# Patient Record
Sex: Male | Born: 1945 | Race: Black or African American | Hispanic: No | Marital: Married | Smoking: Current every day smoker
Health system: Southern US, Community
[De-identification: ages and names within clinical notes are randomized; demographics above are authoritative.]

## PROBLEM LIST (undated history)

## (undated) DIAGNOSIS — E119 Type 2 diabetes mellitus without complications: Secondary | ICD-10-CM

## (undated) DIAGNOSIS — I10 Essential (primary) hypertension: Secondary | ICD-10-CM

## (undated) HISTORY — PX: KNEE SURGERY: SHX244

## (undated) HISTORY — DX: Essential (primary) hypertension: I10

## (undated) HISTORY — DX: Type 2 diabetes mellitus without complications: E11.9

## (undated) HISTORY — PX: HERNIA REPAIR: SHX51

---

## 2006-04-29 ENCOUNTER — Emergency Department (HOSPITAL_COMMUNITY): Admission: EM | Admit: 2006-04-29 | Discharge: 2006-04-29 | Payer: Self-pay | Admitting: Emergency Medicine

## 2019-11-29 ENCOUNTER — Other Ambulatory Visit: Payer: Self-pay

## 2019-11-29 ENCOUNTER — Ambulatory Visit: Payer: Non-veteran care | Attending: Internal Medicine

## 2019-11-29 DIAGNOSIS — Z20822 Contact with and (suspected) exposure to covid-19: Secondary | ICD-10-CM | POA: Insufficient documentation

## 2019-11-30 LAB — NOVEL CORONAVIRUS, NAA: SARS-CoV-2, NAA: NOT DETECTED

## 2020-02-28 ENCOUNTER — Other Ambulatory Visit: Payer: Self-pay | Admitting: Family

## 2020-02-28 ENCOUNTER — Other Ambulatory Visit (HOSPITAL_COMMUNITY): Payer: Self-pay | Admitting: Family

## 2020-02-28 DIAGNOSIS — F17200 Nicotine dependence, unspecified, uncomplicated: Secondary | ICD-10-CM

## 2020-03-18 ENCOUNTER — Ambulatory Visit (HOSPITAL_COMMUNITY)
Admission: RE | Admit: 2020-03-18 | Discharge: 2020-03-18 | Disposition: A | Discharge status: Home / Self Care | Payer: No Typology Code available for payment source | Source: Ambulatory Visit | Attending: Family | Admitting: Family

## 2020-03-18 ENCOUNTER — Other Ambulatory Visit: Payer: Self-pay

## 2020-03-18 DIAGNOSIS — F17200 Nicotine dependence, unspecified, uncomplicated: Secondary | ICD-10-CM | POA: Insufficient documentation

## 2021-03-30 ENCOUNTER — Other Ambulatory Visit (HOSPITAL_COMMUNITY): Payer: Self-pay | Admitting: Physician Assistant

## 2021-03-30 DIAGNOSIS — M25559 Pain in unspecified hip: Secondary | ICD-10-CM

## 2021-04-13 ENCOUNTER — Ambulatory Visit (HOSPITAL_COMMUNITY)
Admission: RE | Admit: 2021-04-13 | Discharge: 2021-04-13 | Disposition: A | Discharge status: Home / Self Care | Payer: No Typology Code available for payment source | Source: Ambulatory Visit | Attending: Physician Assistant | Admitting: Physician Assistant

## 2021-04-13 DIAGNOSIS — M25559 Pain in unspecified hip: Secondary | ICD-10-CM | POA: Diagnosis not present

## 2021-05-06 ENCOUNTER — Other Ambulatory Visit: Payer: Self-pay | Admitting: *Deleted

## 2021-05-06 DIAGNOSIS — M25569 Pain in unspecified knee: Secondary | ICD-10-CM

## 2021-05-08 ENCOUNTER — Other Ambulatory Visit: Payer: Self-pay

## 2021-05-08 ENCOUNTER — Other Ambulatory Visit (HOSPITAL_COMMUNITY)
Admission: RE | Admit: 2021-05-08 | Discharge: 2021-05-08 | Disposition: A | Discharge status: Home / Self Care | Payer: No Typology Code available for payment source | Source: Ambulatory Visit | Attending: Surgery | Admitting: Surgery

## 2021-05-08 ENCOUNTER — Encounter: Payer: Self-pay | Admitting: Vascular Surgery

## 2021-05-08 ENCOUNTER — Ambulatory Visit (HOSPITAL_COMMUNITY)
Admission: RE | Admit: 2021-05-08 | Discharge: 2021-05-08 | Disposition: A | Discharge status: Home / Self Care | Payer: No Typology Code available for payment source | Source: Ambulatory Visit | Attending: Vascular Surgery | Admitting: Vascular Surgery

## 2021-05-08 ENCOUNTER — Ambulatory Visit (INDEPENDENT_AMBULATORY_CARE_PROVIDER_SITE_OTHER): Payer: No Typology Code available for payment source | Admitting: Vascular Surgery

## 2021-05-08 VITALS — BP 130/78 | HR 76 | Temp 97.9°F | Resp 20 | Ht 71.0 in | Wt 135.0 lb

## 2021-05-08 DIAGNOSIS — Z20822 Contact with and (suspected) exposure to covid-19: Secondary | ICD-10-CM | POA: Insufficient documentation

## 2021-05-08 DIAGNOSIS — M25569 Pain in unspecified knee: Secondary | ICD-10-CM | POA: Insufficient documentation

## 2021-05-08 DIAGNOSIS — I739 Peripheral vascular disease, unspecified: Secondary | ICD-10-CM

## 2021-05-08 DIAGNOSIS — Z01812 Encounter for preprocedural laboratory examination: Secondary | ICD-10-CM | POA: Insufficient documentation

## 2021-05-08 LAB — SARS CORONAVIRUS 2 (TAT 6-24 HRS): SARS Coronavirus 2: NEGATIVE

## 2021-05-08 MED ORDER — OXYCODONE-ACETAMINOPHEN 5-325 MG PO TABS: 1.0000 | ORAL_TABLET | ORAL | 0 refills | Status: DC | PRN

## 2021-05-08 NOTE — Progress Notes (Signed)
Patient ID: Todd Duran, male   DOB: May 21, 1946, 75 y.o.   MRN: [id number]  Reason for Consult: New Patient (Initial Visit)   Referred by Jesusita Oka,*  Subjective:     HPI:  Todd Duran is a 75 y.o. male with history of chronic everyday smoking, diabetes and hypertension.  He states that for 2 months he has had progressive wound on his left foot.  This is causing him significant pain.  He denies any fevers or chills.  He has been evaluated the [facility] hospital and sent here.  He denies any previous history of vascular surgery or intervention.  Patient does take aspirin daily.  He denies history of stroke, TIA or amaurosis.  No history of aneurysm personal or family.  Past Medical History:  Diagnosis Date   Diabetes mellitus without complication (HCC)    Hypertension    History reviewed. No pertinent family history. Past Surgical History:  Procedure Laterality Date   HERNIA REPAIR     KNEE SURGERY      Short Social History:  Social History   Tobacco Use   Smoking status: Every Day    Packs/day: 0.50    Pack years: 0.00    Types: Cigarettes   Smokeless tobacco: Never  Substance Use Topics   Alcohol use: Not on file    Allergies  Allergen Reactions   Lisinopril     Other reaction(s): Angioedema, ANGIOEDEMA OF LIPS    Current Outpatient Medications  Medication Sig Dispense Refill   amLODipine (NORVASC) 10 MG tablet Take 1 tablet by mouth daily.     atenolol (TENORMIN) 100 MG tablet TAKE ONE TABLET BY MOUTH EVERY DAY EVERY DAY     atorvastatin (LIPITOR) 20 MG tablet Take 1 tablet by mouth at bedtime.     cyanocobalamin 1000 MCG tablet Take 1 tablet by mouth daily.     hydrochlorothiazide (HYDRODIURIL) 25 MG tablet Take 1 tablet by mouth daily.     metFORMIN (GLUCOPHAGE) 500 MG tablet TAKE ONE TABLET BY MOUTH EVERY DAY WITH FOOD FOR DIABETES.     nicotine polacrilex (NICORETTE) 4 MG gum CHEW 1 PIECE BY MOUTH AS DIRECTED CHEW\T\PARK 1 PIECE OF GUM FOR UP  TO 30 MINUTES EVERY1-2 HOURS AS NEEDED FOR SMOKING CESSATION. DO NOT EXCEED 24 PIECES PER DAY. TAPER USE OVER 8 WEEKS. AVOID FOOD\T\     oxybutynin (DITROPAN) 5 MG tablet TAKE ONE TABLET BY MOUTH TWICE A DAY FOR URINARY URGENCY     Propylene Glycol (SYSTANE BALANCE) 0.6 % SOLN INSTILL 1 DROP IN EACH EYE AS DIRECTED TWO TO FOUR TIMES A DAY CONTACT PHARMACY WHEN NEW SUPPLY IS NEEDED     HYDROcodone-acetaminophen (NORCO) 7.5-325 MG tablet Take by mouth. (Patient not taking: Reported on 05/08/2021)     No current facility-administered medications for this visit.    Review of Systems  Constitutional:  Constitutional negative. HENT: HENT negative.  Eyes: Eyes negative.  Respiratory: Respiratory negative.  GI: Gastrointestinal negative.  Musculoskeletal: Positive for leg pain.  Skin: Positive for wound.  Neurological: Neurological negative. Hematologic: Hematologic/lymphatic negative.  Psychiatric: Psychiatric negative.       Objective:  Objective   Vitals:   05/08/21 1226  BP: 130/78  Pulse: 76  Resp: 20  Temp: 97.9 F (36.6 C)  SpO2: 99%  Weight: 135 lb (61.2 kg)  Height: 5\' 11"  (1.803 m)   Body mass index is 18.83 kg/m.  Physical Exam HENT:     Head: Normocephalic.  Nose:     Comments: Wearing a mask Eyes:     Pupils: Pupils are equal, round, and reactive to light.  Neck:     Vascular: No carotid bruit.  Cardiovascular:     Pulses:          Femoral pulses are 0 on the right side and 0 on the left side. Pulmonary:     Effort: Pulmonary effort is normal.  Abdominal:     General: Abdomen is flat.     Palpations: Abdomen is soft.  Neurological:     General: No focal deficit present.     Mental Status: He is alert.  Psychiatric:        Mood and Affect: Mood normal.        Behavior: Behavior normal.        Thought Content: Thought content normal.        Judgment: Judgment normal.      Data: ABI Findings:   +---------+------------------+-----+--------+--------+  Right    Rt Pressure (mmHg)IndexWaveformComment   +---------+------------------+-----+--------+--------+  Brachial 140                                      +---------+------------------+-----+--------+--------+  PTA                             absent            +---------+------------------+-----+--------+--------+  DP                              absent            +---------+------------------+-----+--------+--------+  Great Toe                       Absent            +---------+------------------+-----+--------+--------+   +---------+------------------+-----+--------+-----------------------------+   Left     Lt Pressure (mmHg)IndexWaveformComment                          +---------+------------------+-----+--------+-----------------------------+   Brachial 144                                                            +---------+------------------+-----+--------+-----------------------------+   PTA                             absent                                  +---------+------------------+-----+--------+-----------------------------+   DP                              absent                                  +---------+------------------+-----+--------+-----------------------------+   Great Toe  unable to obtain due to  ulcer  +---------+------------------+-----+--------+-----------------------------+        Assessment/Plan:     75 year old male presents with what appears to be a nonviable most of his left foot.  I cannot reliably feel femoral pulses bilaterally.  I discussed with him that ideally we would get a CT scan but given that this needs to be done urgently we will plan for angiography possibly need to be done from a brachial approach.  This will mostly be done to determine level of amputation of his left lower  extremity and to evaluate the right lower extremity given ABIs of 0 although he does not currently have any wounds there.  Patient demonstrates good understanding.  I have sent Percocet to his pharmacy today to get him through the weekend given that I am sure this is very painful and he appears very uncomfortable on exam today.     Maeola Harman MD [facility]

## 2021-05-08 NOTE — H&P (View-Only) (Signed)
Patient ID: Todd Duran, male   DOB: May 21, 1946, 75 y.o.   MRN: [id number]  Reason for Consult: New Patient (Initial Visit)   Referred by Jesusita Oka,*  Subjective:     HPI:  Todd Duran is a 75 y.o. male with history of chronic everyday smoking, diabetes and hypertension.  He states that for 2 months he has had progressive wound on his left foot.  This is causing him significant pain.  He denies any fevers or chills.  He has been evaluated the [facility] hospital and sent here.  He denies any previous history of vascular surgery or intervention.  Patient does take aspirin daily.  He denies history of stroke, TIA or amaurosis.  No history of aneurysm personal or family.  Past Medical History:  Diagnosis Date   Diabetes mellitus without complication (HCC)    Hypertension    History reviewed. No pertinent family history. Past Surgical History:  Procedure Laterality Date   HERNIA REPAIR     KNEE SURGERY      Short Social History:  Social History   Tobacco Use   Smoking status: Every Day    Packs/day: 0.50    Pack years: 0.00    Types: Cigarettes   Smokeless tobacco: Never  Substance Use Topics   Alcohol use: Not on file    Allergies  Allergen Reactions   Lisinopril     Other reaction(s): Angioedema, ANGIOEDEMA OF LIPS    Current Outpatient Medications  Medication Sig Dispense Refill   amLODipine (NORVASC) 10 MG tablet Take 1 tablet by mouth daily.     atenolol (TENORMIN) 100 MG tablet TAKE ONE TABLET BY MOUTH EVERY DAY EVERY DAY     atorvastatin (LIPITOR) 20 MG tablet Take 1 tablet by mouth at bedtime.     cyanocobalamin 1000 MCG tablet Take 1 tablet by mouth daily.     hydrochlorothiazide (HYDRODIURIL) 25 MG tablet Take 1 tablet by mouth daily.     metFORMIN (GLUCOPHAGE) 500 MG tablet TAKE ONE TABLET BY MOUTH EVERY DAY WITH FOOD FOR DIABETES.     nicotine polacrilex (NICORETTE) 4 MG gum CHEW 1 PIECE BY MOUTH AS DIRECTED CHEW\T\PARK 1 PIECE OF GUM FOR UP  TO 30 MINUTES EVERY1-2 HOURS AS NEEDED FOR SMOKING CESSATION. DO NOT EXCEED 24 PIECES PER DAY. TAPER USE OVER 8 WEEKS. AVOID FOOD\T\     oxybutynin (DITROPAN) 5 MG tablet TAKE ONE TABLET BY MOUTH TWICE A DAY FOR URINARY URGENCY     Propylene Glycol (SYSTANE BALANCE) 0.6 % SOLN INSTILL 1 DROP IN EACH EYE AS DIRECTED TWO TO FOUR TIMES A DAY CONTACT PHARMACY WHEN NEW SUPPLY IS NEEDED     HYDROcodone-acetaminophen (NORCO) 7.5-325 MG tablet Take by mouth. (Patient not taking: Reported on 05/08/2021)     No current facility-administered medications for this visit.    Review of Systems  Constitutional:  Constitutional negative. HENT: HENT negative.  Eyes: Eyes negative.  Respiratory: Respiratory negative.  GI: Gastrointestinal negative.  Musculoskeletal: Positive for leg pain.  Skin: Positive for wound.  Neurological: Neurological negative. Hematologic: Hematologic/lymphatic negative.  Psychiatric: Psychiatric negative.       Objective:  Objective   Vitals:   05/08/21 1226  BP: 130/78  Pulse: 76  Resp: 20  Temp: 97.9 F (36.6 C)  SpO2: 99%  Weight: 135 lb (61.2 kg)  Height: 5\' 11"  (1.803 m)   Body mass index is 18.83 kg/m.  Physical Exam HENT:     Head: Normocephalic.  Nose:     Comments: Wearing a mask Eyes:     Pupils: Pupils are equal, round, and reactive to light.  Neck:     Vascular: No carotid bruit.  Cardiovascular:     Pulses:          Femoral pulses are 0 on the right side and 0 on the left side. Pulmonary:     Effort: Pulmonary effort is normal.  Abdominal:     General: Abdomen is flat.     Palpations: Abdomen is soft.  Neurological:     General: No focal deficit present.     Mental Status: He is alert.  Psychiatric:        Mood and Affect: Mood normal.        Behavior: Behavior normal.        Thought Content: Thought content normal.        Judgment: Judgment normal.      Data: ABI Findings:   +---------+------------------+-----+--------+--------+  Right    Rt Pressure (mmHg)IndexWaveformComment   +---------+------------------+-----+--------+--------+  Brachial 140                                      +---------+------------------+-----+--------+--------+  PTA                             absent            +---------+------------------+-----+--------+--------+  DP                              absent            +---------+------------------+-----+--------+--------+  Great Toe                       Absent            +---------+------------------+-----+--------+--------+   +---------+------------------+-----+--------+-----------------------------+   Left     Lt Pressure (mmHg)IndexWaveformComment                          +---------+------------------+-----+--------+-----------------------------+   Brachial 144                                                            +---------+------------------+-----+--------+-----------------------------+   PTA                             absent                                  +---------+------------------+-----+--------+-----------------------------+   DP                              absent                                  +---------+------------------+-----+--------+-----------------------------+   Great Toe  unable to obtain due to  ulcer  +---------+------------------+-----+--------+-----------------------------+        Assessment/Plan:     75 year old male presents with what appears to be a nonviable most of his left foot.  I cannot reliably feel femoral pulses bilaterally.  I discussed with him that ideally we would get a CT scan but given that this needs to be done urgently we will plan for angiography possibly need to be done from a brachial approach.  This will mostly be done to determine level of amputation of his left lower  extremity and to evaluate the right lower extremity given ABIs of 0 although he does not currently have any wounds there.  Patient demonstrates good understanding.  I have sent Percocet to his pharmacy today to get him through the weekend given that I am sure this is very painful and he appears very uncomfortable on exam today.     Maeola Harman MD [facility]

## 2021-05-12 ENCOUNTER — Inpatient Hospital Stay (HOSPITAL_COMMUNITY)
Admission: RE | Admit: 2021-05-12 | Discharge: 2021-05-15 | DRG: 240 | Disposition: A | Discharge status: Rehabilitation Facility | Payer: No Typology Code available for payment source | Attending: Surgery | Admitting: Surgery

## 2021-05-12 ENCOUNTER — Other Ambulatory Visit: Payer: Self-pay

## 2021-05-12 ENCOUNTER — Encounter (HOSPITAL_COMMUNITY)
Admission: RE | Disposition: A | Discharge status: Rehabilitation Facility | Payer: Self-pay | Source: Home / Self Care | Attending: Surgery

## 2021-05-12 DIAGNOSIS — Z7984 Long term (current) use of oral hypoglycemic drugs: Secondary | ICD-10-CM | POA: Diagnosis not present

## 2021-05-12 DIAGNOSIS — I708 Atherosclerosis of other arteries: Secondary | ICD-10-CM | POA: Diagnosis present

## 2021-05-12 DIAGNOSIS — I10 Essential (primary) hypertension: Secondary | ICD-10-CM | POA: Diagnosis not present

## 2021-05-12 DIAGNOSIS — E1152 Type 2 diabetes mellitus with diabetic peripheral angiopathy with gangrene: Principal | ICD-10-CM | POA: Diagnosis present

## 2021-05-12 DIAGNOSIS — Z79899 Other long term (current) drug therapy: Secondary | ICD-10-CM

## 2021-05-12 DIAGNOSIS — E871 Hypo-osmolality and hyponatremia: Secondary | ICD-10-CM | POA: Diagnosis not present

## 2021-05-12 DIAGNOSIS — F17210 Nicotine dependence, cigarettes, uncomplicated: Secondary | ICD-10-CM | POA: Diagnosis present

## 2021-05-12 DIAGNOSIS — E785 Hyperlipidemia, unspecified: Secondary | ICD-10-CM | POA: Diagnosis present

## 2021-05-12 DIAGNOSIS — I701 Atherosclerosis of renal artery: Secondary | ICD-10-CM | POA: Diagnosis not present

## 2021-05-12 DIAGNOSIS — Z888 Allergy status to other drugs, medicaments and biological substances status: Secondary | ICD-10-CM

## 2021-05-12 DIAGNOSIS — G546 Phantom limb syndrome with pain: Secondary | ICD-10-CM | POA: Diagnosis not present

## 2021-05-12 DIAGNOSIS — Z89512 Acquired absence of left leg below knee: Secondary | ICD-10-CM | POA: Diagnosis not present

## 2021-05-12 DIAGNOSIS — Z89612 Acquired absence of left leg above knee: Secondary | ICD-10-CM | POA: Diagnosis not present

## 2021-05-12 DIAGNOSIS — L97529 Non-pressure chronic ulcer of other part of left foot with unspecified severity: Secondary | ICD-10-CM | POA: Diagnosis present

## 2021-05-12 DIAGNOSIS — Z7982 Long term (current) use of aspirin: Secondary | ICD-10-CM

## 2021-05-12 DIAGNOSIS — I70262 Atherosclerosis of native arteries of extremities with gangrene, left leg: Secondary | ICD-10-CM | POA: Diagnosis present

## 2021-05-12 DIAGNOSIS — D62 Acute posthemorrhagic anemia: Secondary | ICD-10-CM | POA: Diagnosis not present

## 2021-05-12 DIAGNOSIS — I739 Peripheral vascular disease, unspecified: Secondary | ICD-10-CM | POA: Diagnosis not present

## 2021-05-12 DIAGNOSIS — I700 Atherosclerosis of aorta: Secondary | ICD-10-CM | POA: Diagnosis not present

## 2021-05-12 DIAGNOSIS — R400 Somnolence: Secondary | ICD-10-CM | POA: Diagnosis not present

## 2021-05-12 DIAGNOSIS — I998 Other disorder of circulatory system: Secondary | ICD-10-CM

## 2021-05-12 DIAGNOSIS — E11621 Type 2 diabetes mellitus with foot ulcer: Secondary | ICD-10-CM | POA: Diagnosis present

## 2021-05-12 DIAGNOSIS — I70221 Atherosclerosis of native arteries of extremities with rest pain, right leg: Secondary | ICD-10-CM | POA: Diagnosis present

## 2021-05-12 DIAGNOSIS — Z20822 Contact with and (suspected) exposure to covid-19: Secondary | ICD-10-CM | POA: Diagnosis not present

## 2021-05-12 DIAGNOSIS — E119 Type 2 diabetes mellitus without complications: Secondary | ICD-10-CM | POA: Diagnosis not present

## 2021-05-12 DIAGNOSIS — Z7289 Other problems related to lifestyle: Secondary | ICD-10-CM

## 2021-05-12 HISTORY — PX: ABDOMINAL AORTOGRAM W/LOWER EXTREMITY: CATH118223

## 2021-05-12 LAB — POCT I-STAT, CHEM 8
BUN: 31 mg/dL — ABNORMAL HIGH (ref 8–23)
Calcium, Ion: 1.39 mmol/L (ref 1.15–1.40)
Chloride: 95 mmol/L — ABNORMAL LOW (ref 98–111)
Creatinine, Ser: 1 mg/dL (ref 0.61–1.24)
Glucose, Bld: 179 mg/dL — ABNORMAL HIGH (ref 70–99)
HCT: 31 % — ABNORMAL LOW (ref 39.0–52.0)
Hemoglobin: 10.5 g/dL — ABNORMAL LOW (ref 13.0–17.0)
Potassium: 4.5 mmol/L (ref 3.5–5.1)
Sodium: 130 mmol/L — ABNORMAL LOW (ref 135–145)
TCO2: 25 mmol/L (ref 22–32)

## 2021-05-12 LAB — SARS CORONAVIRUS 2 BY RT PCR (HOSPITAL ORDER, PERFORMED IN ~~LOC~~ HOSPITAL LAB): SARS Coronavirus 2: NEGATIVE

## 2021-05-12 LAB — GLUCOSE, CAPILLARY
Glucose-Capillary: 112 mg/dL — ABNORMAL HIGH (ref 70–99)
Glucose-Capillary: 164 mg/dL — ABNORMAL HIGH (ref 70–99)

## 2021-05-12 SURGERY — ABDOMINAL AORTOGRAM W/LOWER EXTREMITY
Anesthesia: LOCAL

## 2021-05-12 MED ORDER — ONDANSETRON HCL 4 MG/2ML IJ SOLN: 4.0000 mg | Freq: Four times a day (QID) | INTRAMUSCULAR | Status: DC | PRN

## 2021-05-12 MED ORDER — SODIUM CHLORIDE 0.9% FLUSH
3.0000 mL | Freq: Two times a day (BID) | INTRAVENOUS | Status: DC
  Administered 2021-05-13 – 2021-05-15 (×5): 3 mL via INTRAVENOUS

## 2021-05-12 MED ORDER — SODIUM CHLORIDE 0.9 % IV SOLN: 250.0000 mL | INTRAVENOUS | Status: DC | PRN

## 2021-05-12 MED ORDER — IODIXANOL 320 MG/ML IV SOLN
INTRAVENOUS | Status: DC | PRN
  Administered 2021-05-12: 97 mL via INTRA_ARTERIAL

## 2021-05-12 MED ORDER — AMLODIPINE BESYLATE 10 MG PO TABS
10.0000 mg | ORAL_TABLET | Freq: Every day | ORAL | Status: DC
  Administered 2021-05-13 – 2021-05-15 (×3): 10 mg via ORAL
  Filled 2021-05-12 (×3): qty 1

## 2021-05-12 MED ORDER — ACETAMINOPHEN 325 MG PO TABS: 650.0000 mg | ORAL_TABLET | ORAL | Status: DC | PRN

## 2021-05-12 MED ORDER — ROSUVASTATIN CALCIUM 5 MG PO TABS: 10.0000 mg | ORAL_TABLET | Freq: Every day | ORAL | Status: DC

## 2021-05-12 MED ORDER — MIDAZOLAM HCL 2 MG/2ML IJ SOLN
INTRAMUSCULAR | Status: AC
  Filled 2021-05-12: qty 2

## 2021-05-12 MED ORDER — NICOTINE POLACRILEX 2 MG MT GUM
2.0000 mg | CHEWING_GUM | OROMUCOSAL | Status: DC | PRN
  Filled 2021-05-12: qty 1

## 2021-05-12 MED ORDER — HYDROCHLOROTHIAZIDE 25 MG PO TABS
25.0000 mg | ORAL_TABLET | Freq: Every day | ORAL | Status: DC
  Administered 2021-05-14 – 2021-05-15 (×2): 25 mg via ORAL
  Filled 2021-05-12 (×3): qty 1

## 2021-05-12 MED ORDER — HEPARIN (PORCINE) IN NACL 1000-0.9 UT/500ML-% IV SOLN
INTRAVENOUS | Status: DC | PRN
  Administered 2021-05-12 (×2): 500 mL

## 2021-05-12 MED ORDER — MORPHINE SULFATE (PF) 4 MG/ML IV SOLN
4.0000 mg | Freq: Once | INTRAVENOUS | Status: AC
  Administered 2021-05-12: 4 mg via INTRAVENOUS
  Filled 2021-05-12: qty 1

## 2021-05-12 MED ORDER — HEPARIN (PORCINE) IN NACL 1000-0.9 UT/500ML-% IV SOLN
INTRAVENOUS | Status: AC
  Filled 2021-05-12: qty 1000

## 2021-05-12 MED ORDER — SODIUM CHLORIDE 0.9 % IV SOLN: INTRAVENOUS | Status: DC

## 2021-05-12 MED ORDER — FENTANYL CITRATE (PF) 100 MCG/2ML IJ SOLN
INTRAMUSCULAR | Status: AC
  Filled 2021-05-12: qty 2

## 2021-05-12 MED ORDER — ATORVASTATIN CALCIUM 10 MG PO TABS
20.0000 mg | ORAL_TABLET | Freq: Every day | ORAL | Status: DC
  Administered 2021-05-13 – 2021-05-14 (×2): 20 mg via ORAL
  Filled 2021-05-12 (×2): qty 2

## 2021-05-12 MED ORDER — ASPIRIN EC 81 MG PO TBEC
81.0000 mg | DELAYED_RELEASE_TABLET | Freq: Every day | ORAL | Status: DC
  Administered 2021-05-14 – 2021-05-15 (×2): 81 mg via ORAL
  Filled 2021-05-12 (×3): qty 1

## 2021-05-12 MED ORDER — OXYBUTYNIN CHLORIDE 5 MG PO TABS
2.5000 mg | ORAL_TABLET | Freq: Two times a day (BID) | ORAL | Status: DC
  Administered 2021-05-12 – 2021-05-15 (×6): 2.5 mg via ORAL
  Filled 2021-05-12 (×7): qty 0.5

## 2021-05-12 MED ORDER — LIDOCAINE HCL (PF) 1 % IJ SOLN
INTRAMUSCULAR | Status: AC
  Filled 2021-05-12: qty 30

## 2021-05-12 MED ORDER — MIDAZOLAM HCL 2 MG/2ML IJ SOLN
INTRAMUSCULAR | Status: DC | PRN
  Administered 2021-05-12 (×2): 1 mg via INTRAVENOUS

## 2021-05-12 MED ORDER — SODIUM CHLORIDE 0.9% FLUSH: 3.0000 mL | INTRAVENOUS | Status: DC | PRN

## 2021-05-12 MED ORDER — FENTANYL CITRATE (PF) 100 MCG/2ML IJ SOLN
INTRAMUSCULAR | Status: DC | PRN
  Administered 2021-05-12: 50 ug via INTRAVENOUS

## 2021-05-12 MED ORDER — HYDRALAZINE HCL 20 MG/ML IJ SOLN: 5.0000 mg | INTRAMUSCULAR | Status: DC | PRN

## 2021-05-12 MED ORDER — VITAMIN B-12 1000 MCG PO TABS
1000.0000 ug | ORAL_TABLET | Freq: Every day | ORAL | Status: DC
  Administered 2021-05-13 – 2021-05-15 (×3): 1000 ug via ORAL
  Filled 2021-05-12 (×3): qty 1

## 2021-05-12 MED ORDER — SODIUM CHLORIDE 0.9 % WEIGHT BASED INFUSION
1.0000 mL/kg/h | INTRAVENOUS | Status: DC
  Administered 2021-05-12: 1 mL/kg/h via INTRAVENOUS

## 2021-05-12 MED ORDER — OXYCODONE HCL 5 MG PO TABS
5.0000 mg | ORAL_TABLET | ORAL | Status: DC | PRN
  Administered 2021-05-12 – 2021-05-15 (×7): 10 mg via ORAL
  Filled 2021-05-12 (×8): qty 2

## 2021-05-12 MED ORDER — LABETALOL HCL 5 MG/ML IV SOLN: 10.0000 mg | INTRAVENOUS | Status: DC | PRN

## 2021-05-12 MED ORDER — ATENOLOL 25 MG PO TABS
25.0000 mg | ORAL_TABLET | Freq: Two times a day (BID) | ORAL | Status: DC
  Administered 2021-05-12 – 2021-05-15 (×6): 25 mg via ORAL
  Filled 2021-05-12 (×6): qty 1

## 2021-05-12 MED ORDER — LIDOCAINE HCL (PF) 1 % IJ SOLN
INTRAMUSCULAR | Status: DC | PRN
  Administered 2021-05-12: 18 mL via INTRADERMAL

## 2021-05-12 MED ORDER — MORPHINE SULFATE (PF) 4 MG/ML IV SOLN
4.0000 mg | INTRAVENOUS | Status: DC | PRN
  Administered 2021-05-13 – 2021-05-14 (×6): 4 mg via INTRAVENOUS
  Filled 2021-05-12 (×6): qty 1

## 2021-05-12 SURGICAL SUPPLY — 9 items
CATH ANGIO 5F BER2 65CM (CATHETERS) ×1 IMPLANT
CATH OMNI FLUSH 5F 65CM (CATHETERS) ×1 IMPLANT
KIT MICROPUNCTURE NIT STIFF (SHEATH) ×1 IMPLANT
KIT PV (KITS) ×2 IMPLANT
SHEATH PINNACLE 5F 10CM (SHEATH) ×1 IMPLANT
SYR MEDRAD MARK V 150ML (SYRINGE) ×1 IMPLANT
TRANSDUCER W/STOPCOCK (MISCELLANEOUS) ×2 IMPLANT
TRAY PV CATH (CUSTOM PROCEDURE TRAY) ×2 IMPLANT
WIRE BENTSON .035X145CM (WIRE) ×1 IMPLANT

## 2021-05-12 NOTE — Op Note (Signed)
    Patient name: Todd Duran MRN: [id number] DOB: 1946/02/17 Sex: male  05/12/2021 Pre-operative Diagnosis: Ischemic left foot Post-operative diagnosis:  Same Surgeon:  [name] Procedure Performed:  1.  Ultrasound-guided access, left common femoral artery  2.  Abdominal aortogram  3.  Bilateral lower extremity runoff  4.  Conscious sedation, 50 minutes   Indications: This is a 75 year old gentleman with ischemic left foot and severe disease on the right who comes in today for angiographic evaluation  Procedure:  The patient was identified in the holding area and taken to room 8.  The patient was then placed supine on the table and prepped and draped in the usual sterile fashion.  A time out was called.  Conscious sedation was administered with the use of IV fentanyl and Versed under continuous physician and nurse monitoring.  Heart rate, blood pressure, and oxygen saturation were continuously monitored.  Total sedation time was 50 minutes.  Ultrasound was used to evaluate the left common femoral artery.  It was patent .  A digital ultrasound image was acquired.  A micropuncture needle was used to access the left common femoral artery under ultrasound guidance.  An 018 wire was advanced without resistance and a micropuncture sheath was placed.  The 018 wire was removed and a benson wire was placed.  The micropuncture sheath was exchanged for a 5 french sheath.  An omniflush catheter was advanced over the wire to the level of L-1.  An abdominal angiogram was obtained.  Next, the catheter was pulled down to the aortic bifurcation and bilateral runoff was performed Findings:   Aortogram: Bilateral renal artery stenosis.  The infrarenal abdominal aorta is calcified but patent throughout its course.  Moderate stenosis is visualized at the origin of the left common iliac artery.  The external iliac artery is calcified but patent throughout its course.  There is a 90% right iliac stenosis  Right  Lower Extremity: There is a subtotal occlusion with heavy calcification within the right common femoral artery.  The profundofemoral artery is patent throughout its course.  The superficial femoral artery is also patent however there is severe stenosis within the adductor canal.  The popliteal artery is patent however there is limited visualization of the tibial vessels secondary to proximal disease.  There does appear to be two-vessel runoff via the posterior tibial and peroneal artery.  Left Lower Extremity: The left common femoral artery is occluded.  There is reconstitution of the profundofemoral artery.  The superficial femoral artery is patent down the adductor canal where it occludes.  There is reconstitution of the above-knee popliteal artery with three-vessel runoff via very diseased vessels.  Intervention: None  Impression:  #1  Left common femoral artery occlusion.  Left superficial femoral artery occlusion at the adductor canal with popliteal reconstitution and three-vessel runoff  #2  Right iliac stenosis, 90%  #3  Severe right common femoral stenosis along with severe right superficial femoral artery stenosis  #4  The patient will require left below-knee amputation.  At a later date he will be considered for right femoral endarterectomy and iliac stenting, with possible superficial femoral artery stenting.     [name], M.D., [facility] [place] Office: [phone] Pager:  [phone]

## 2021-05-12 NOTE — Progress Notes (Signed)
Pt received from cath lab. VSS. L groin level 0. Pt and family oriented to room and unit. Call light in reach.  Versie Starks, RN

## 2021-05-12 NOTE — Interval H&P Note (Signed)
History and Physical Interval Note:  05/12/2021 1:06 PM  HAU SANOR  has presented today for surgery, with the diagnosis of PAD.  The various methods of treatment have been discussed with the patient and family. After consideration of risks, benefits and other options for treatment, the patient has consented to  Procedure(s): ABDOMINAL AORTOGRAM W/LOWER EXTREMITY (N/A) as a surgical intervention.  The patient's history has been reviewed, patient examined, no change in status, stable for surgery.  I have reviewed the patient's chart and labs.  Questions were answered to the patient's satisfaction.     [name]

## 2021-05-13 ENCOUNTER — Encounter (HOSPITAL_COMMUNITY)
Admission: RE | Disposition: A | Discharge status: Rehabilitation Facility | Payer: Self-pay | Source: Home / Self Care | Attending: Surgery

## 2021-05-13 ENCOUNTER — Inpatient Hospital Stay (HOSPITAL_COMMUNITY): Payer: No Typology Code available for payment source | Admitting: Anesthesiology

## 2021-05-13 ENCOUNTER — Encounter (HOSPITAL_COMMUNITY): Payer: Self-pay | Admitting: Surgery

## 2021-05-13 ENCOUNTER — Inpatient Hospital Stay (HOSPITAL_COMMUNITY)
Admission: RE | Admit: 2021-05-13 | Payer: No Typology Code available for payment source | Source: Home / Self Care | Admitting: Surgery

## 2021-05-13 DIAGNOSIS — I998 Other disorder of circulatory system: Secondary | ICD-10-CM

## 2021-05-13 HISTORY — PX: AMPUTATION: SHX166

## 2021-05-13 LAB — CBC
HCT: 26.1 % — ABNORMAL LOW (ref 39.0–52.0)
Hemoglobin: 8.3 g/dL — ABNORMAL LOW (ref 13.0–17.0)
MCH: 28.6 pg (ref 26.0–34.0)
MCHC: 31.8 g/dL (ref 30.0–36.0)
MCV: 90 fL (ref 80.0–100.0)
Platelets: 437 10*3/uL — ABNORMAL HIGH (ref 150–400)
RBC: 2.9 MIL/uL — ABNORMAL LOW (ref 4.22–5.81)
RDW: 12.8 % (ref 11.5–15.5)
WBC: 13 10*3/uL — ABNORMAL HIGH (ref 4.0–10.5)
nRBC: 0 % (ref 0.0–0.2)

## 2021-05-13 LAB — GLUCOSE, CAPILLARY
Glucose-Capillary: 117 mg/dL — ABNORMAL HIGH (ref 70–99)
Glucose-Capillary: 111 mg/dL — ABNORMAL HIGH (ref 70–99)
Glucose-Capillary: 117 mg/dL — ABNORMAL HIGH (ref 70–99)
Glucose-Capillary: 118 mg/dL — ABNORMAL HIGH (ref 70–99)
Glucose-Capillary: 256 mg/dL — ABNORMAL HIGH (ref 70–99)
Glucose-Capillary: 174 mg/dL — ABNORMAL HIGH (ref 70–99)

## 2021-05-13 LAB — HEMOGLOBIN A1C
Hgb A1c MFr Bld: 5.2 % (ref 4.8–5.6)
Mean Plasma Glucose: 102.54 mg/dL

## 2021-05-13 LAB — BASIC METABOLIC PANEL
Anion gap: 11 (ref 5–15)
BUN: 28 mg/dL — ABNORMAL HIGH (ref 8–23)
CO2: 26 mmol/L (ref 22–32)
Calcium: 10.5 mg/dL — ABNORMAL HIGH (ref 8.9–10.3)
Chloride: 94 mmol/L — ABNORMAL LOW (ref 98–111)
Creatinine, Ser: 0.94 mg/dL (ref 0.61–1.24)
GFR, Estimated: >60 mL/min (ref >60)
Glucose, Bld: 118 mg/dL — ABNORMAL HIGH (ref 70–99)
Potassium: 4.7 mmol/L (ref 3.5–5.1)
Sodium: 131 mmol/L — ABNORMAL LOW (ref 135–145)

## 2021-05-13 LAB — TYPE AND SCREEN
ABO/RH(D): O POS
Antibody Screen: NEGATIVE

## 2021-05-13 LAB — SURGICAL PCR SCREEN
MRSA, PCR: NEGATIVE
Staphylococcus aureus: NEGATIVE

## 2021-05-13 LAB — ABO/RH: ABO/RH(D): O POS

## 2021-05-13 SURGERY — AMPUTATION BELOW KNEE
Anesthesia: Monitor Anesthesia Care | Site: Leg Lower | Laterality: Left

## 2021-05-13 MED ORDER — ALUM & MAG HYDROXIDE-SIMETH 200-200-20 MG/5ML PO SUSP: 15.0000 mL | ORAL | Status: DC | PRN

## 2021-05-13 MED ORDER — ACETAMINOPHEN 500 MG PO TABS
1000.0000 mg | ORAL_TABLET | Freq: Once | ORAL | Status: AC
  Administered 2021-05-13: 1000 mg via ORAL

## 2021-05-13 MED ORDER — 0.9 % SODIUM CHLORIDE (POUR BTL) OPTIME
TOPICAL | Status: DC | PRN
  Administered 2021-05-13: 1000 mL

## 2021-05-13 MED ORDER — HYDROMORPHONE HCL 1 MG/ML IJ SOLN: 0.2500 mg | INTRAMUSCULAR | Status: DC | PRN

## 2021-05-13 MED ORDER — DOCUSATE SODIUM 100 MG PO CAPS
100.0000 mg | ORAL_CAPSULE | Freq: Every day | ORAL | Status: DC
  Administered 2021-05-14 – 2021-05-15 (×2): 100 mg via ORAL
  Filled 2021-05-13 (×3): qty 1

## 2021-05-13 MED ORDER — PANTOPRAZOLE SODIUM 40 MG PO TBEC
40.0000 mg | DELAYED_RELEASE_TABLET | Freq: Every day | ORAL | Status: DC
  Administered 2021-05-13 – 2021-05-15 (×3): 40 mg via ORAL
  Filled 2021-05-13 (×3): qty 1

## 2021-05-13 MED ORDER — INSULIN ASPART 100 UNIT/ML IJ SOLN
0.0000 [IU] | Freq: Three times a day (TID) | INTRAMUSCULAR | Status: DC
  Administered 2021-05-13: 5 [IU] via SUBCUTANEOUS
  Administered 2021-05-14 – 2021-05-15 (×2): 1 [IU] via SUBCUTANEOUS

## 2021-05-13 MED ORDER — LIDOCAINE HCL (CARDIAC) PF 100 MG/5ML IV SOSY
PREFILLED_SYRINGE | INTRAVENOUS | Status: DC | PRN
  Administered 2021-05-13: 20 mg via INTRATRACHEAL

## 2021-05-13 MED ORDER — LACTATED RINGERS IV SOLN: INTRAVENOUS | Status: DC

## 2021-05-13 MED ORDER — PROPOFOL 500 MG/50ML IV EMUL
INTRAVENOUS | Status: DC | PRN
  Administered 2021-05-13: 25 ug/kg/min via INTRAVENOUS

## 2021-05-13 MED ORDER — FENTANYL CITRATE (PF) 100 MCG/2ML IJ SOLN
50.0000 ug | Freq: Once | INTRAMUSCULAR | Status: AC
  Administered 2021-05-13: 50 ug via INTRAVENOUS

## 2021-05-13 MED ORDER — MIDAZOLAM HCL 2 MG/2ML IJ SOLN: 0.5000 mg | Freq: Once | INTRAMUSCULAR | Status: DC | PRN

## 2021-05-13 MED ORDER — PHENOL 1.4 % MT LIQD: 1.0000 | OROMUCOSAL | Status: DC | PRN

## 2021-05-13 MED ORDER — MAGNESIUM SULFATE 2 GM/50ML IV SOLN: 2.0000 g | Freq: Every day | INTRAVENOUS | Status: DC | PRN

## 2021-05-13 MED ORDER — CHLORHEXIDINE GLUCONATE 4 % EX LIQD
60.0000 mL | Freq: Once | CUTANEOUS | Status: DC
  Filled 2021-05-13: qty 15

## 2021-05-13 MED ORDER — POLYETHYLENE GLYCOL 3350 17 G PO PACK: 17.0000 g | PACK | Freq: Every day | ORAL | Status: DC | PRN

## 2021-05-13 MED ORDER — OXYCODONE HCL 5 MG PO TABS: 5.0000 mg | ORAL_TABLET | Freq: Once | ORAL | Status: DC | PRN

## 2021-05-13 MED ORDER — MEPERIDINE HCL 25 MG/ML IJ SOLN: 6.2500 mg | INTRAMUSCULAR | Status: DC | PRN

## 2021-05-13 MED ORDER — OXYCODONE HCL 5 MG/5ML PO SOLN: 5.0000 mg | Freq: Once | ORAL | Status: DC | PRN

## 2021-05-13 MED ORDER — ONDANSETRON HCL 4 MG/2ML IJ SOLN
INTRAMUSCULAR | Status: DC | PRN
  Administered 2021-05-13: 4 mg via INTRAVENOUS

## 2021-05-13 MED ORDER — ORAL CARE MOUTH RINSE: 15.0000 mL | Freq: Once | OROMUCOSAL | Status: AC

## 2021-05-13 MED ORDER — PROPOFOL 10 MG/ML IV BOLUS
INTRAVENOUS | Status: DC | PRN
  Administered 2021-05-13: 30 mg via INTRAVENOUS
  Administered 2021-05-13 (×2): 20 mg via INTRAVENOUS

## 2021-05-13 MED ORDER — BISACODYL 10 MG RE SUPP: 10.0000 mg | Freq: Every day | RECTAL | Status: DC | PRN

## 2021-05-13 MED ORDER — METOPROLOL TARTRATE 5 MG/5ML IV SOLN: 2.0000 mg | INTRAVENOUS | Status: DC | PRN

## 2021-05-13 MED ORDER — PROMETHAZINE HCL 25 MG/ML IJ SOLN: 6.2500 mg | INTRAMUSCULAR | Status: DC | PRN

## 2021-05-13 MED ORDER — CHLORHEXIDINE GLUCONATE 0.12 % MT SOLN
15.0000 mL | Freq: Once | OROMUCOSAL | Status: AC
  Administered 2021-05-13: 15 mL via OROMUCOSAL
  Filled 2021-05-13: qty 15

## 2021-05-13 MED ORDER — PHENYLEPHRINE 40 MCG/ML (10ML) SYRINGE FOR IV PUSH (FOR BLOOD PRESSURE SUPPORT)
PREFILLED_SYRINGE | INTRAVENOUS | Status: DC | PRN
  Administered 2021-05-13 (×2): 40 ug via INTRAVENOUS
  Administered 2021-05-13: 80 ug via INTRAVENOUS
  Administered 2021-05-13 (×3): 40 ug via INTRAVENOUS
  Administered 2021-05-13: 80 ug via INTRAVENOUS
  Administered 2021-05-13 (×5): 40 ug via INTRAVENOUS

## 2021-05-13 MED ORDER — POTASSIUM CHLORIDE CRYS ER 20 MEQ PO TBCR: 20.0000 meq | EXTENDED_RELEASE_TABLET | Freq: Every day | ORAL | Status: DC | PRN

## 2021-05-13 MED ORDER — BUPIVACAINE LIPOSOME 1.3 % IJ SUSP
INTRAMUSCULAR | Status: DC | PRN
  Administered 2021-05-13: 7 mg via PERINEURAL
  Administered 2021-05-13: 3 mg via PERINEURAL

## 2021-05-13 MED ORDER — SODIUM CHLORIDE 0.9 % IV SOLN: INTRAVENOUS | Status: DC

## 2021-05-13 MED ORDER — GUAIFENESIN-DM 100-10 MG/5ML PO SYRP: 15.0000 mL | ORAL_SOLUTION | ORAL | Status: DC | PRN

## 2021-05-13 MED ORDER — CHLORHEXIDINE GLUCONATE 4 % EX LIQD: 60.0000 mL | Freq: Once | CUTANEOUS | Status: DC

## 2021-05-13 MED ORDER — CEFAZOLIN SODIUM-DEXTROSE 2-4 GM/100ML-% IV SOLN
2.0000 g | INTRAVENOUS | Status: AC
  Administered 2021-05-13: 2 g via INTRAVENOUS
  Filled 2021-05-13: qty 100

## 2021-05-13 MED ORDER — HEPARIN SODIUM (PORCINE) 5000 UNIT/ML IJ SOLN
5000.0000 [IU] | Freq: Three times a day (TID) | INTRAMUSCULAR | Status: DC
  Administered 2021-05-14 – 2021-05-15 (×5): 5000 [IU] via SUBCUTANEOUS
  Filled 2021-05-13 (×5): qty 1

## 2021-05-13 MED ORDER — MIDAZOLAM HCL 2 MG/2ML IJ SOLN
1.0000 mg | Freq: Once | INTRAMUSCULAR | Status: AC
  Administered 2021-05-13: 1 mg via INTRAVENOUS

## 2021-05-13 MED ORDER — BUPIVACAINE-EPINEPHRINE (PF) 0.5% -1:200000 IJ SOLN
INTRAMUSCULAR | Status: DC | PRN
  Administered 2021-05-13: 13 mL via PERINEURAL
  Administered 2021-05-13: 7 mL via PERINEURAL

## 2021-05-13 MED ORDER — CEFAZOLIN SODIUM-DEXTROSE 2-4 GM/100ML-% IV SOLN
2.0000 g | Freq: Three times a day (TID) | INTRAVENOUS | Status: AC
  Administered 2021-05-13 – 2021-05-14 (×2): 2 g via INTRAVENOUS
  Filled 2021-05-13 (×2): qty 100

## 2021-05-13 SURGICAL SUPPLY — 45 items
BAG COUNTER SPONGE SURGICOUNT (BAG) ×3 IMPLANT
BAG SPNG CNTER NS LX DISP (BAG) ×2
BLADE CORE FAN STRYKER (BLADE) IMPLANT
BLADE SAW GIGLI 510 (BLADE) ×2 IMPLANT
BNDG CONFORM 3 STRL LF (GAUZE/BANDAGES/DRESSINGS) IMPLANT
BNDG ELASTIC 4X5.8 VLCR STR LF (GAUZE/BANDAGES/DRESSINGS) ×1 IMPLANT
BNDG GAUZE ELAST 4 BULKY (GAUZE/BANDAGES/DRESSINGS) ×3 IMPLANT
CANISTER SUCT 3000ML PPV (MISCELLANEOUS) ×3 IMPLANT
COVER SURGICAL LIGHT HANDLE (MISCELLANEOUS) ×3 IMPLANT
DRAPE EXTREMITY T 121X128X90 (DISPOSABLE) ×3 IMPLANT
DRAPE HALF SHEET 40X57 (DRAPES) ×3 IMPLANT
DRSG ADAPTIC 3X8 NADH LF (GAUZE/BANDAGES/DRESSINGS) ×2 IMPLANT
ELECT REM PT RETURN 9FT ADLT (ELECTROSURGICAL) ×3
ELECTRODE REM PT RTRN 9FT ADLT (ELECTROSURGICAL) ×2 IMPLANT
GAUZE SPONGE 4X4 12PLY STRL (GAUZE/BANDAGES/DRESSINGS) ×3 IMPLANT
GLOVE SURG POLYISO LF SZ7.5 (GLOVE) ×3 IMPLANT
GLOVE SURG UNDER POLY LF SZ7.5 (GLOVE) ×3 IMPLANT
GOWN STRL REUS W/ TWL LRG LVL3 (GOWN DISPOSABLE) ×4 IMPLANT
GOWN STRL REUS W/ TWL XL LVL3 (GOWN DISPOSABLE) ×3 IMPLANT
GOWN STRL REUS W/TWL LRG LVL3 (GOWN DISPOSABLE) ×6
GOWN STRL REUS W/TWL XL LVL3 (GOWN DISPOSABLE) ×6
KIT BASIN OR (CUSTOM PROCEDURE TRAY) ×3 IMPLANT
KIT TURNOVER KIT B (KITS) ×3 IMPLANT
NDL HYPO 25GX1X1/2 BEV (NEEDLE) IMPLANT
NEEDLE HYPO 25GX1X1/2 BEV (NEEDLE) IMPLANT
NS IRRIG 1000ML POUR BTL (IV SOLUTION) ×3 IMPLANT
PACK GENERAL/GYN (CUSTOM PROCEDURE TRAY) ×3 IMPLANT
PAD ARMBOARD 7.5X6 YLW CONV (MISCELLANEOUS) ×6 IMPLANT
SPECIMEN JAR SMALL (MISCELLANEOUS) ×1 IMPLANT
SPONGE T-LAP 18X18 ~~LOC~~+RFID (SPONGE) ×2 IMPLANT
SUT ETHILON 1 LR 30 (SUTURE) IMPLANT
SUT ETHILON 2 0 PSLX (SUTURE) ×4 IMPLANT
SUT ETHILON 3 0 PS 1 (SUTURE) ×1 IMPLANT
SUT SILK 2 0 (SUTURE) ×3
SUT SILK 2 0 SH CR/8 (SUTURE) ×2 IMPLANT
SUT SILK 2-0 18XBRD TIE 12 (SUTURE) ×1 IMPLANT
SUT SILK 3 0 (SUTURE) ×3
SUT SILK 3-0 18XBRD TIE 12 (SUTURE) ×1 IMPLANT
SUT VIC AB 2-0 CT1 18 (SUTURE) ×6 IMPLANT
SYR CONTROL 10ML LL (SYRINGE) IMPLANT
TAPE UMBILICAL 1/8 X36 TWILL (MISCELLANEOUS) ×2 IMPLANT
TOWEL GREEN STERILE (TOWEL DISPOSABLE) ×4 IMPLANT
TOWEL GREEN STERILE FF (TOWEL DISPOSABLE) ×3 IMPLANT
UNDERPAD 30X36 HEAVY ABSORB (UNDERPADS AND DIAPERS) ×3 IMPLANT
WATER STERILE IRR 1000ML POUR (IV SOLUTION) ×3 IMPLANT

## 2021-05-13 NOTE — Anesthesia Preprocedure Evaluation (Addendum)
Anesthesia Evaluation  Patient identified by MRN, date of birth, ID band Patient awake    Reviewed: Allergy & Precautions, NPO status , Patient's Chart, lab work & pertinent test results, reviewed documented beta blocker date and time   History of Anesthesia Complications Negative for: history of anesthetic complications  Airway Mallampati: I  TM Distance: >3 FB Neck ROM: Full    Dental  (+) Poor Dentition, Missing, Loose, Dental Advisory Given, Chipped   Pulmonary Current Smoker and Patient abstained from smoking.,  05/12/2021 SARS coronavirus NEG   breath sounds clear to auscultation       Cardiovascular hypertension, Pt. on medications and Pt. on home beta blockers (-) angina+ Peripheral Vascular Disease   Rhythm:Regular Rate:Normal     Neuro/Psych negative neurological ROS     GI/Hepatic negative GI ROS, Neg liver ROS,   Endo/Other  diabetes (glu 111), Oral Hypoglycemic Agents  Renal/GU negative Renal ROS     Musculoskeletal   Abdominal   Peds  Hematology  (+) Blood dyscrasia (Hb 8.3), anemia ,   Anesthesia Other Findings   Reproductive/Obstetrics                            Anesthesia Physical Anesthesia Plan  ASA: 3  Anesthesia Plan: MAC and Regional   Post-op Pain Management:    Induction:   PONV Risk Score and Plan: 0 and Ondansetron  Airway Management Planned: Natural Airway and Simple Face Mask  Additional Equipment: None  Intra-op Plan:   Post-operative Plan:   Informed Consent: I have reviewed the patients History and Physical, chart, labs and discussed the procedure including the risks, benefits and alternatives for the proposed anesthesia with the patient or authorized representative who has indicated his/her understanding and acceptance.     Dental advisory given  Plan Discussed with: CRNA and Surgeon  Anesthesia Plan Comments: (Plan routine monitors,  popliteal and adductor canal blocks for post op analgesia)       Anesthesia Quick Evaluation

## 2021-05-13 NOTE — Anesthesia Postprocedure Evaluation (Signed)
Anesthesia Post Note  Patient: Todd Duran  Procedure(s) Performed: AMPUTATION BELOW KNEE LEFT  (Left: Leg Lower)     Patient location during evaluation: PACU Anesthesia Type: Regional Level of consciousness: awake and alert, patient cooperative and oriented Pain management: pain level controlled Vital Signs Assessment: post-procedure vital signs reviewed and stable Respiratory status: nonlabored ventilation, spontaneous breathing and respiratory function stable Cardiovascular status: blood pressure returned to baseline and stable Postop Assessment: no apparent nausea or vomiting and adequate PO intake Anesthetic complications: no   No notable events documented.  Last Vitals:  Vitals:   05/13/21 1245 05/13/21 1300  BP: 98/69 123/72  Pulse: 89 76  Resp: 10 15  Temp: 37.3 C   SpO2: 97% 97%    Last Pain:  Vitals:   05/13/21 1245  TempSrc:   PainSc: 0-No pain                 Odysseus Cada,E. Takeyla Million

## 2021-05-13 NOTE — Anesthesia Procedure Notes (Signed)
Procedure Name: MAC Date/Time: 05/13/2021 11:00 AM Performed by: Michele Rockers, CRNA Pre-anesthesia Checklist: Patient identified, Emergency Drugs available, Suction available, Timeout performed and Patient being monitored Patient Re-evaluated:Patient Re-evaluated prior to induction Oxygen Delivery Method: Simple face mask

## 2021-05-13 NOTE — Transfer of Care (Signed)
Immediate Anesthesia Transfer of Care Note  Patient: Todd Duran  Procedure(s) Performed: AMPUTATION BELOW KNEE LEFT  (Left: Leg Lower)  Patient Location: PACU  Anesthesia Type:MAC  Level of Consciousness: drowsy, patient cooperative and responds to stimulation  Airway & Oxygen Therapy: Patient Spontanous Breathing  Post-op Assessment: Report given to RN, Post -op Vital signs reviewed and stable and Patient moving all extremities X 4  Post vital signs: Reviewed and stable  Last Vitals:  Vitals Value Taken Time  BP 98/69 05/13/21 1244  Temp    Pulse 73 05/13/21 1245  Resp 21 05/13/21 1245  SpO2 98 % 05/13/21 1245  Vitals shown include unvalidated device data.  Last Pain:  Vitals:   05/13/21 1006  TempSrc: Oral  PainSc: 2       Patients Stated Pain Goal: 0 (76/54/65 0354)  Complications: No notable events documented.

## 2021-05-13 NOTE — Progress Notes (Signed)
Pt arrived from PACU, VSS, stump dressing Clean dry and intact, family at bedside, call light within reach, will continue to monitor.   Kalman Jewels, RN 05/13/2021 1:45 PM

## 2021-05-13 NOTE — Anesthesia Procedure Notes (Signed)
Anesthesia Regional Block: Popliteal block   Pre-Anesthetic Checklist: , timeout performed,  Correct Patient, Correct Site, Correct Laterality,  Correct Procedure, Correct Position, site marked,  Risks and benefits discussed,  Surgical consent,  Pre-op evaluation,  At surgeon's request  Laterality: Left and Lower  Prep: chloraprep       Needles:  Injection technique: Single-shot  Needle Type: Echogenic Needle     Needle Length: 9cm  Needle Gauge: 21     Additional Needles:   Procedures:,,,, ultrasound used (permanent image in chart),,    Narrative:  Start time: 05/13/2021 10:54 AM End time: 05/13/2021 11:00 AM Injection made incrementally with aspirations every 5 mL.  Performed by: Personally  Anesthesiologist: Jairo Ben, MD  Additional Notes: Pt identified in Holding room.  Monitors applied. Working IV access confirmed. Sterile prep L lateral knee/distal thigh.  #21ga ECHOgenic ARROW block needle to sciatic nerve at split with US guidance.  13cc 0.5% Bupivacaine with 1:200k epi, Exparel injected incrementally after negative test dose.  Patient asymptomatic, VSS, no heme aspirated, tolerated well.   Sandford Craze, MD

## 2021-05-13 NOTE — Progress Notes (Signed)
    Subjective  - POD #1, s/p Angio  No acute events   Physical Exam:  Ischemic left foot Canulation site ok       Assessment/Plan:  POD #1  Extensive conversation with patient, sister and wife.  His left foot is clearly not salvagable and he will need a BKA. I discussed the possibility of non healing and more proximal amputation. We also discussed after he recovers, he will need a right femoral endarterectomy and iliac stent. All questions answered  Wells Caressa Scearce 05/13/2021 9:58 AM --  Vitals:   05/13/21 0356 05/13/21 0730  BP: 127/67 129/83  Pulse: 75 100  Resp: 17 18  Temp: 98.2 F (36.8 C) 98.9 F (37.2 C)  SpO2: 99% 98%    Intake/Output Summary (Last 24 hours) at 05/13/2021 0958 Last data filed at 05/13/2021 0403 Gross per 24 hour  Intake 936.29 ml  Output 600 ml  Net 336.29 ml     Laboratory CBC    Component Value Date/Time   WBC 13.0 (H) 05/13/2021 0218   HGB 8.3 (L) 05/13/2021 0218   HCT 26.1 (L) 05/13/2021 0218   PLT 437 (H) 05/13/2021 0218    BMET    Component Value Date/Time   NA 131 (L) 05/13/2021 0218   K 4.7 05/13/2021 0218   CL 94 (L) 05/13/2021 0218   CO2 26 05/13/2021 0218   GLUCOSE 118 (H) 05/13/2021 0218   BUN 28 (H) 05/13/2021 0218   CREATININE 0.94 05/13/2021 0218   CALCIUM 10.5 (H) 05/13/2021 0218   GFRNONAA >60 05/13/2021 0218    COAG No results found for: INR, PROTIME No results found for: PTT  Antibiotics Anti-infectives (From admission, onward)    Start     Dose/Rate Route Frequency Ordered Stop   05/13/21 0900  ceFAZolin (ANCEF) IVPB 2g/100 mL premix        2 g 200 mL/hr over 30 Minutes Intravenous 30 min pre-op 05/13/21 0638          V. Charlena Cross, M.D., [facility] [place] Office: [phone] Pager:  [phone]

## 2021-05-13 NOTE — Progress Notes (Signed)
Orthopedic Tech Progress Note Patient Details:  Todd Duran 11/07/1946 [id number]  Called in order to [employer] for a VIVE PROTOCOL BK  Patient ID: Todd Duran, male   DOB: 09/19/1946, 75 y.o.   MRN: [id number]  Todd Duran 05/13/2021, 2:15 PM

## 2021-05-13 NOTE — Anesthesia Procedure Notes (Signed)
Anesthesia Regional Block: Adductor canal block   Pre-Anesthetic Checklist: , timeout performed,  Correct Patient, Correct Site, Correct Laterality,  Correct Procedure, Correct Position, site marked,  Risks and benefits discussed,  Surgical consent,  Pre-op evaluation,  At surgeon's request  Laterality: Left and Lower  Prep: chloraprep       Needles:  Injection technique: Single-shot  Needle Type: Echogenic Needle     Needle Length: 9cm  Needle Gauge: 21     Additional Needles:   Procedures:,,,, ultrasound used (permanent image in chart),,    Narrative:  Start time: 05/13/2021 10:47 AM End time: 05/13/2021 10:53 AM Injection made incrementally with aspirations every 5 mL.  Performed by: Personally  Anesthesiologist: Jairo Ben, MD  Additional Notes: Pt identified in Holding room.  Monitors applied. Working IV access confirmed. Sterile prep L thigh.  #21ga ECHOgenic ARROW block needle to adductor canal with US guidance.  7cc 0.5% Bupivacaine with 1:200k epi, Exparel injected incrementally after negative test dose.  Patient asymptomatic, VSS, no heme aspirated, tolerated well.   Sandford Craze, MD

## 2021-05-13 NOTE — Op Note (Signed)
    Patient name: Todd Duran MRN: [id number] DOB: 09-Jul-1946 Sex: male  05/13/2021 Pre-operative Diagnosis: Ischemic left foot Post-operative diagnosis:  Same Surgeon:  [name] Assistants: Lelon Mast Ryne Procedure:   Left below-knee amputation Anesthesia: Regional Blood Loss: Minimal Specimens: Left leg  Findings: Moderate capillary bleeding at the amputation site.  Muscle appeared viable  Indications: This is a 75 year old gentleman who presented with an ischemic left foot that is not salvageable.  I discussed proceeding with below-knee amputation.  Procedure:  The patient was identified in the holding area and taken to [facility] OR ROOM 12  The patient was then placed supine on the table. regional anesthesia was administered.  The patient was prepped and draped in the usual sterile fashion.  A time out was called and antibiotics were administered.  A PA was necessary to expedite the procedure and assist with technical details.  A circumferential measurement of the left leg was taken 10 cm below the tibial tuberosity.  I created a two thirds one third posterior flap.  Incision was made with a 10 blade.  Cautery was used divide the subcutaneous tissue and muscle.  The tibia was circumferentially exposed and a periosteal elevator was used to elevate the periosteum.  Similarly, the fibula was circumferentially exposed and the periosteum was removed.  A Gigli saw was used to transect the tibia beveling the anterior surface.  Bone cutters were used to transect the fibula proximal to the cut edge of the tibia.  I then exposed the neurovascular bundle and divided this between clamps.  The leg was then removed using the amputation knife.  I then dissected out the neurovascular bundle and ligated the artery and vein proximal to the cut edge of the tibia.  The nerve was also ligated proximal to the cut edge of the tibia.  The wound was copiously irrigated.  Hemostasis was achieved.  The fascia was  reapproximated with interrupted 2-0 Vicryl.  The skin was closed with staples.  Sterile dressings were applied.  There were no immediate complications.   Disposition: To PACU stable.   [name], M.D., [facility] Office: [phone] Pager:  [phone]

## 2021-05-14 ENCOUNTER — Encounter (HOSPITAL_COMMUNITY): Payer: Self-pay | Admitting: Surgery

## 2021-05-14 LAB — GLUCOSE, CAPILLARY
Glucose-Capillary: 124 mg/dL — ABNORMAL HIGH (ref 70–99)
Glucose-Capillary: 122 mg/dL — ABNORMAL HIGH (ref 70–99)
Glucose-Capillary: 127 mg/dL — ABNORMAL HIGH (ref 70–99)
Glucose-Capillary: 155 mg/dL — ABNORMAL HIGH (ref 70–99)

## 2021-05-14 LAB — COMPREHENSIVE METABOLIC PANEL
ALT: 16 U/L (ref 0–44)
AST: 35 U/L (ref 15–41)
Albumin: 2 g/dL — ABNORMAL LOW (ref 3.5–5.0)
Alkaline Phosphatase: 111 U/L (ref 38–126)
Anion gap: 10 (ref 5–15)
BUN: 17 mg/dL (ref 8–23)
CO2: 25 mmol/L (ref 22–32)
Calcium: 10 mg/dL (ref 8.9–10.3)
Chloride: 96 mmol/L — ABNORMAL LOW (ref 98–111)
Creatinine, Ser: 0.9 mg/dL (ref 0.61–1.24)
GFR, Estimated: >60 mL/min (ref >60)
Glucose, Bld: 153 mg/dL — ABNORMAL HIGH (ref 70–99)
Potassium: 4.5 mmol/L (ref 3.5–5.1)
Sodium: 131 mmol/L — ABNORMAL LOW (ref 135–145)
Total Bilirubin: 0.4 mg/dL (ref 0.3–1.2)
Total Protein: 7.4 g/dL (ref 6.5–8.1)

## 2021-05-14 LAB — BASIC METABOLIC PANEL
Anion gap: 7 (ref 5–15)
BUN: 21 mg/dL (ref 8–23)
CO2: 27 mmol/L (ref 22–32)
Calcium: 9.6 mg/dL (ref 8.9–10.3)
Chloride: 96 mmol/L — ABNORMAL LOW (ref 98–111)
Creatinine, Ser: 0.83 mg/dL (ref 0.61–1.24)
GFR, Estimated: >60 mL/min (ref >60)
Glucose, Bld: 116 mg/dL — ABNORMAL HIGH (ref 70–99)
Potassium: 4 mmol/L (ref 3.5–5.1)
Sodium: 130 mmol/L — ABNORMAL LOW (ref 135–145)

## 2021-05-14 LAB — CBC
HCT: 22.5 % — ABNORMAL LOW (ref 39.0–52.0)
Hemoglobin: 7 g/dL — ABNORMAL LOW (ref 13.0–17.0)
MCH: 28 pg (ref 26.0–34.0)
MCHC: 31.1 g/dL (ref 30.0–36.0)
MCV: 90 fL (ref 80.0–100.0)
Platelets: 382 10*3/uL (ref 150–400)
RBC: 2.5 MIL/uL — ABNORMAL LOW (ref 4.22–5.81)
RDW: 12.7 % (ref 11.5–15.5)
WBC: 6.4 10*3/uL (ref 4.0–10.5)
nRBC: 0 % (ref 0.0–0.2)

## 2021-05-14 LAB — MAGNESIUM: Magnesium: 1.5 mg/dL — ABNORMAL LOW (ref 1.7–2.4)

## 2021-05-14 LAB — PHOSPHORUS: Phosphorus: 3.5 mg/dL (ref 2.5–4.6)

## 2021-05-14 MED ORDER — LORAZEPAM 1 MG PO TABS: 1.0000 mg | ORAL_TABLET | ORAL | Status: DC | PRN

## 2021-05-14 MED ORDER — THIAMINE HCL 100 MG PO TABS
100.0000 mg | ORAL_TABLET | Freq: Every day | ORAL | Status: DC
  Administered 2021-05-15: 100 mg via ORAL
  Filled 2021-05-14: qty 1

## 2021-05-14 MED ORDER — THIAMINE HCL 100 MG/ML IJ SOLN: 100.0000 mg | Freq: Every day | INTRAMUSCULAR | Status: DC

## 2021-05-14 MED ORDER — FOLIC ACID 1 MG PO TABS
1.0000 mg | ORAL_TABLET | Freq: Every day | ORAL | Status: DC
  Administered 2021-05-14 – 2021-05-15 (×2): 1 mg via ORAL
  Filled 2021-05-14 (×2): qty 1

## 2021-05-14 MED ORDER — LORAZEPAM 2 MG/ML IJ SOLN: 1.0000 mg | INTRAMUSCULAR | Status: DC | PRN

## 2021-05-14 MED ORDER — SENNOSIDES-DOCUSATE SODIUM 8.6-50 MG PO TABS
1.0000 | ORAL_TABLET | Freq: Two times a day (BID) | ORAL | Status: DC
  Administered 2021-05-14 – 2021-05-15 (×3): 1 via ORAL
  Filled 2021-05-14 (×3): qty 1

## 2021-05-14 MED ORDER — ADULT MULTIVITAMIN W/MINERALS CH
1.0000 | ORAL_TABLET | Freq: Every day | ORAL | Status: DC
  Administered 2021-05-14 – 2021-05-15 (×2): 1 via ORAL
  Filled 2021-05-14 (×2): qty 1

## 2021-05-14 MED ORDER — DEXMEDETOMIDINE HCL IN NACL 400 MCG/100ML IV SOLN
0.2000 ug/kg/h | INTRAVENOUS | Status: DC
  Filled 2021-05-14: qty 100

## 2021-05-14 NOTE — Progress Notes (Signed)
Mobility Specialist: Progress Note   05/14/21 1503  Mobility  Activity Transferred:  Chair to bed  Level of Assistance Moderate assist, patient does 50-74%  Assistive Device Front wheel walker  Distance Ambulated (ft) 2 ft  Mobility Out of bed to chair with meals  Mobility Response Tolerated well  Mobility performed by Mobility specialist;Other (comment) (PT Iantha Fallen)  $Mobility charge 1 Mobility   Pre-Mobility: 90 HR, 115/73 BP Post-Mobility: 89 HR  Pt required verbal cues for hand placement and direction throughout transfer. Pt was modA to stand from chair and was able to hop pivot to the bed with some physical assistance with RW. Pt independent getting positioned in the bed. Pt has call bell at his side and family present in the room.   [facility] Ran Tullis Mobility Specialist Mobility Specialist Phone: [phone]

## 2021-05-14 NOTE — Evaluation (Signed)
Occupational Therapy Evaluation Patient Details Name: Todd Duran MRN: [id number] DOB: 1946-06-13 Today's Date: 05/14/2021    History of Present Illness Patient is a 75 year old male s/p L BKA. PMH includes DM, HTN   Clinical Impression   Patient lives at home with spouse in a multi level home with 4 steps to enter main level, 4 steps up to bedroom. Patient reports spouse works for a nursing care company doing paperwork. Patient is independent with self care and ambulation at baseline without and assistive device. Currently patient needing mod to max A for lower body ADLs, set up assistance for upper body ADLs and heavy mod A with rolling walker to stand pivot to recliner chair. Patient with poor safety awareness needing cues for body mechanics as well as eccentric control into recliner. Despite being premedicated patient also limited by pain and throbbing in leg "I have to sit down." Recommend continued acute OT services to maximize patient safety and independence with self care in order to facilitate D/C to venue listed below.     Follow Up Recommendations  CIR    Equipment Recommendations  Tub/shower bench;Other (comment);3 in 1 bedside commode (rolling walker)    Recommendations for Other Services Rehab consult     Precautions / Restrictions Precautions Precautions: Fall Restrictions Weight Bearing Restrictions: Yes LLE Weight Bearing: Non weight bearing      Mobility Bed Mobility Overal bed mobility: Needs Assistance Bed Mobility: Supine to Sit     Supine to sit: Min guard;HOB elevated     General bed mobility comments: increased time, use of bed rail    Transfers Overall transfer level: Needs assistance Equipment used: Rolling walker (2 wheeled) Transfers: Sit to/from [employer] Sit to Stand: Mod assist;From elevated surface Stand pivot transfers: Mod assist       General transfer comment: please see toilet transfer in ADL section, high fall  risk with poor eccentric control into recliner    Balance Overall balance assessment: Needs assistance Sitting-balance support: Feet supported Sitting balance-Leahy Scale: Fair     Standing balance support: Bilateral upper extremity supported Standing balance-Leahy Scale: Poor Standing balance comment: reliant on UE support and mod A                           ADL either performed or assessed with clinical judgement   ADL Overall ADL's : Needs assistance/impaired Eating/Feeding: Independent   Grooming: Set up;Sitting   Upper Body Bathing: Set up;Sitting   Lower Body Bathing: Moderate assistance;Sitting/lateral leans;Bed level   Upper Body Dressing : Set up;Sitting   Lower Body Dressing: Moderate assistance;Sitting/lateral leans;Bed level   Toilet Transfer: Moderate assistance;Stand-pivot;Cueing for safety;Cueing for sequencing;BSC;RW Toilet Transfer Details (indicate cue type and reason): to recliner, bed height elevated and mod A to power up to standing. patient able to pivot and take small hop on R foot however poor eccentric control into chair needing heavy mod A to safely sit into recliner Toileting- Clothing Manipulation and Hygiene: Total assistance       Functional mobility during ADLs: Moderate assistance;Cueing for safety;Cueing for sequencing;Rolling walker General ADL Comments: patient needing increased assistance with self care tasks due to new BKA impacting balance, pain level, activity tolerance, safety      Pertinent Vitals/Pain Pain Assessment: Faces Faces Pain Scale: Hurts little more Pain Location: L residual limb Pain Descriptors / Indicators: Throbbing;Operative site guarding Pain Intervention(s): Premedicated before session     Hand Dominance  Right   Extremity/Trunk Assessment Upper Extremity Assessment Upper Extremity Assessment: Generalized weakness   Lower Extremity Assessment Lower Extremity Assessment: Defer to PT evaluation        Communication Communication Communication: HOH   Cognition Arousal/Alertness: Awake/alert Behavior During Therapy: WFL for tasks assessed/performed Overall Cognitive Status: Within Functional Limits for tasks assessed                                                Home Living Family/patient expects to be discharged to:: Private residence Living Arrangements: Spouse/significant other Available Help at Discharge: Family;Available PRN/intermittently (spouse works) Type of Home: House Home Access: Stairs to enter Secretary/administrator of Steps: 4 Entrance Stairs-Rails: Right;Left Home Layout: Multi-level Alternate Level Stairs-Number of Steps: 4 steps to bedroom   Bathroom Shower/Tub: Chief Strategy Officer: Standard     Home Equipment: None          Prior Functioning/Environment Level of Independence: Independent                 OT Problem List: Decreased strength;Decreased activity tolerance;Impaired balance (sitting and/or standing);Decreased safety awareness;Decreased knowledge of use of DME or AE;Decreased knowledge of precautions;Pain      OT Treatment/Interventions: Self-care/ADL training;DME and/or AE instruction;Therapeutic activities;Patient/family education;Balance training    OT Goals(Current goals can be found in the care plan section) Acute Rehab OT Goals Patient Stated Goal: less pain OT Goal Formulation: With patient Time For Goal Achievement: 05/28/21 Potential to Achieve Goals: Good  OT Frequency: Min 2X/week    AM-PAC OT "6 Clicks" Daily Activity     Outcome Measure Help from another person eating meals?: None Help from another person taking care of personal grooming?: A Little Help from another person toileting, which includes using toliet, bedpan, or urinal?: A Lot Help from another person bathing (including washing, rinsing, drying)?: A Lot Help from another person to put on and taking off regular upper  body clothing?: A Little Help from another person to put on and taking off regular lower body clothing?: A Lot 6 Click Score: 16   End of Session Equipment Utilized During Treatment: Gait belt;Rolling walker Nurse Communication: Mobility status  Activity Tolerance: Patient tolerated treatment well Patient left: in chair;with call bell/phone within reach;with chair alarm set  OT Visit Diagnosis: Unsteadiness on feet (R26.81);Other abnormalities of gait and mobility (R26.89);Muscle weakness (generalized) (M62.81);Pain Pain - Right/Left: Left Pain - part of body: Leg                Time: 6213-0865 OT Time Calculation (min): 29 min Charges:  OT General Charges $OT Visit: 1 Visit OT Evaluation $OT Eval Moderate Complexity: 1 Mod OT Treatments $Self Care/Home Management : 8-22 mins  Marlyce Huge OT OT pager: [phone]  Carmelia Roller 05/14/2021, 1:27 PM

## 2021-05-14 NOTE — Evaluation (Signed)
Physical Therapy Evaluation Patient Details Name: Todd Duran MRN: [id number] DOB: January 17, 1946 Today's Date: 05/14/2021   History of Present Illness  Patient is a 75 year old male s/p L BKA for non salvageable L foot.   PMH includes DM, HTN  Clinical Impression  Pt admitted with/for ischemic, non salvageable L foot/LE.  Pt s/p BKA 7/5.  Pt needing mod assist for mobility OOB, but mobilizes in bed with min guard assist..  Pt currently limited functionally due to the problems listed. ( See problems list.)   Pt will benefit from PT to maximize function and safety in order to get ready for next venue listed below.     Follow Up Recommendations CIR;Supervision/Assistance - 24 hour    Equipment Recommendations  Other (comment);Rolling walker with 5" wheels;3in1 (PT) (TBA)    Recommendations for Other Services Rehab consult     Precautions / Restrictions Precautions Precautions: Fall Restrictions Weight Bearing Restrictions: Yes LLE Weight Bearing: Non weight bearing      Mobility  Bed Mobility Overal bed mobility: Needs Assistance Bed Mobility: Sit to Supine     Supine to sit: Min guard;HOB elevated Sit to supine: Min guard   General bed mobility comments: increased time, boost well enough, but needing upper and LE strengthening.    Transfers Overall transfer level: Needs assistance Equipment used: Rolling walker (2 wheeled) Transfers: Sit to/from [employer] Sit to Stand: Mod assist;From elevated surface;Min assist Stand pivot transfers: Mod assist       General transfer comment: Cued/demo'd transfer technique/safety and pivot/"swing to" pattern.  Ambulation/Gait Ambulation/Gait assistance: Mod assist Gait Distance (Feet): 4 Feet (forward and back with the RW) Assistive device: Rolling walker (2 wheeled) Gait Pattern/deviations: Step-to pattern     General Gait Details: moderate cuing to get pt understanding "swing to " on UE's, moderate  stability assist.  Stairs            Wheelchair Mobility    Modified Rankin (Stroke Patients Only)       Balance Overall balance assessment: Needs assistance Sitting-balance support: Feet supported Sitting balance-Leahy Scale: Fair     Standing balance support: Bilateral upper extremity supported Standing balance-Leahy Scale: Poor Standing balance comment: reliant on UE support and mod A                             Pertinent Vitals/Pain Pain Assessment: 0-10 Pain Score: 5  Faces Pain Scale: Hurts little more Pain Location: L residual limb Pain Descriptors / Indicators: Throbbing;Operative site guarding Pain Intervention(s): Premedicated before session    Home Living Family/patient expects to be discharged to:: Private residence Living Arrangements: Spouse/significant other Available Help at Discharge: Family;Available PRN/intermittently (spouse works) Type of Home: House Home Access: Stairs to enter Entrance Stairs-Rails: Doctor, general practice of Steps: 4 Home Layout: Multi-level Home Equipment: None      Prior Function Level of Independence: Independent               Hand Dominance   Dominant Hand: Right    Extremity/Trunk Assessment   Upper Extremity Assessment Upper Extremity Assessment: Generalized weakness    Lower Extremity Assessment Lower Extremity Assessment: Generalized weakness       Communication   Communication: HOH  Cognition Arousal/Alertness: Awake/alert Behavior During Therapy: WFL for tasks assessed/performed Overall Cognitive Status: Within Functional Limits for tasks assessed  General Comments      Exercises     Assessment/Plan    PT Assessment Patient needs continued PT services  PT Problem List Decreased strength;Decreased activity tolerance;Decreased balance;Decreased mobility;Decreased knowledge of use of DME;Pain       PT  Treatment Interventions DME instruction;Gait training;Functional mobility training;Therapeutic activities;Balance training;Patient/family education    PT Goals (Current goals can be found in the Care Plan section)  Acute Rehab PT Goals Patient Stated Goal: less pain PT Goal Formulation: With patient Time For Goal Achievement: 05/28/21 Potential to Achieve Goals: Good    Frequency Min 4X/week   Barriers to discharge        Co-evaluation               AM-PAC PT "6 Clicks" Mobility  Outcome Measure Help needed turning from your back to your side while in a flat bed without using bedrails?: A Little Help needed moving from lying on your back to sitting on the side of a flat bed without using bedrails?: A Little Help needed moving to and from a bed to a chair (including a wheelchair)?: A Lot Help needed standing up from a chair using your arms (e.g., wheelchair or bedside chair)?: A Lot Help needed to walk in hospital room?: A Lot Help needed climbing 3-5 steps with a railing? : Total 6 Click Score: 13    End of Session Equipment Utilized During Treatment: Gait belt Activity Tolerance: Patient tolerated treatment well;Patient limited by pain Patient left: in bed;with call bell/phone within reach;with bed alarm set;with family/visitor present Nurse Communication: Mobility status PT Visit Diagnosis: Other abnormalities of gait and mobility (R26.89);Muscle weakness (generalized) (M62.81);Difficulty in walking, not elsewhere classified (R26.2)    Time: 0174-9449 PT Time Calculation (min) (ACUTE ONLY): 17 min   Charges:   PT Evaluation $PT Eval Moderate Complexity: 1 Mod          05/14/2021  Jacinto Halim., PT Acute Rehabilitation Services [phone]  (pager) [phone]  (office)  Todd Duran 05/14/2021, 3:44 PM

## 2021-05-14 NOTE — PMR Pre-admission (Signed)
PMR Admission Coordinator Pre-Admission Assessment  Patient: Todd Duran is an 75 y.o., male MRN: [id number] DOB: September 01, 1946 Height: $RemoveBefo'5\' 11"'CmSVNOocwUC$  (180.3 cm) Weight: 61.2 kg  Insurance Information HMO:     PPO:      PCP:      IPA:      80/20:      OTHER:  PRIMARY:  [place]   Policy#: [id number]      Subscriber: pt CM Name: Todd Duran      Phone#: [phone]     Fax#: [phone] Pre-Cert#: TBD approved for 30 days      Employer:  Benefits:  Phone #: [phone]     Name:  Eff. Date: active      CIR: per [facility] benefits      SNF:  Outpatient:      Co-Pay:  Home Health:       Co-Pay:  DME:      Co-Pay:  Providers: [facility] network  SECONDARY:       Policy#:      Phone#:   Development worker, community:       Phone#:   The Engineer, petroleum" for patients in Inpatient Rehabilitation Facilities with attached "Privacy Act [place] Records" was provided and verbally reviewed with: N/A  Emergency Contact Information Contact Information     Name Relation Home Work Mobile   Todd Duran Spouse   [phone]   Cobb,Todd Duran Duran   [phone]   Duran, Todd Duran   [phone]       Current Medical History  Patient Admitting Diagnosis: BKA  History of Present Illness: 75 year old male with history of smoking, diabetes and HTN. Progressive wound of hi left foot for 2 months, Presented on 05/12/2021 with the diagnosis of PAD. Angiogram on 7/5 to RLE.There is a subtotal occlusion with heavy calcification within the right common femoral artery.  The profundofemoral artery is patent throughout its course.  The superficial femoral artery is also patent however there is severe stenosis within the adductor canal.  The popliteal artery is patent however there is limited visualization of the tibial vessels secondary to proximal disease.  There does appear to be two-vessel runoff via the posterior tibial and peroneal artery.   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 Limb Guard placed.   He will need right femoral endarterectomy and iliac stent once recovered from L BKA.  Patient's medical record from [facility]  has been reviewed by the rehabilitation admission coordinator and physician.  Past Medical History  Past Medical History:  Diagnosis Date   Diabetes mellitus without complication ([place])    Hypertension     Family History   family history is not on file.  Prior Rehab/Hospitalizations Has the patient had prior rehab or hospitalizations prior to admission? Yes  Has the patient had major surgery during 100 days prior to admission? Yes   Current Medications  Current Facility-Administered Medications:    0.9 %  sodium chloride infusion, 250 mL, Intravenous, PRN, Rhyne, Samantha J, PA-C   0.9 %  sodium chloride infusion, , Intravenous, Continuous, Rhyne, Samantha J, PA-C, Stopped at 05/14/21 4825   acetaminophen (TYLENOL) tablet 650 mg, 650 mg, Oral, Q4H PRN, Rhyne, Samantha J, PA-C   alum & mag hydroxide-simeth (MAALOX/MYLANTA) 200-200-20 MG/5ML suspension 15-30 mL, 15-30 mL, Oral, Q2H PRN, Rhyne, Samantha J, PA-C   amLODipine (NORVASC) tablet 10 mg, 10 mg, Oral, Daily, Rhyne, Samantha J, PA-C, 10 mg at 05/15/21 1043   aspirin EC tablet 81 mg, 81 mg, Oral,  Daily, Gabriel Earing, PA-C, 81 mg at 05/15/21 1046   atenolol (TENORMIN) tablet 25 mg, 25 mg, Oral, BID, Rhyne, Samantha J, PA-C, 25 mg at 05/15/21 1043   atorvastatin (LIPITOR) tablet 20 mg, 20 mg, Oral, QHS, Rhyne, Samantha J, PA-C, 20 mg at 05/14/21 2146   bisacodyl (DULCOLAX) suppository 10 mg, 10 mg, Rectal, Daily PRN, Rhyne, Samantha J, PA-C   docusate sodium (COLACE) capsule 100 mg, 100 mg, Oral, Daily, Rhyne, Samantha J, PA-C, 100 mg at 70/35/00 9381   folic acid (FOLVITE) tablet 1 mg, 1 mg, Oral, Daily, Eveland, Matthew, PA-C, 1 mg at 05/15/21 1044   guaiFENesin-dextromethorphan (ROBITUSSIN DM) 100-10 MG/5ML syrup 15 mL, 15 mL, Oral, Q4H PRN, Rhyne, Samantha J, PA-C   heparin injection  5,000 Units, 5,000 Units, Subcutaneous, Q8H, Rhyne, Samantha J, PA-C, 5,000 Units at 05/15/21 0555   hydrALAZINE (APRESOLINE) injection 5 mg, 5 mg, Intravenous, Q20 Min PRN, Rhyne, Samantha J, PA-C   hydrochlorothiazide (HYDRODIURIL) tablet 25 mg, 25 mg, Oral, Daily, Rhyne, Samantha J, PA-C, 25 mg at 05/15/21 1043   insulin aspart (novoLOG) injection 0-9 Units, 0-9 Units, Subcutaneous, TID WC, Rhyne, Samantha J, PA-C, 1 Units at 05/15/21 8299   labetalol (NORMODYNE) injection 10 mg, 10 mg, Intravenous, Q10 min PRN, Rhyne, Samantha J, PA-C   LORazepam (ATIVAN) tablet 1-4 mg, 1-4 mg, Oral, Q1H PRN **OR** LORazepam (ATIVAN) injection 1-4 mg, 1-4 mg, Intravenous, Q1H PRN, Cameron Proud, Matthew, PA-C   magnesium sulfate IVPB 2 g 50 mL, 2 g, Intravenous, Daily PRN, Rhyne, Samantha J, PA-C   metoprolol tartrate (LOPRESSOR) injection 2-5 mg, 2-5 mg, Intravenous, Q2H PRN, Rhyne, Samantha J, PA-C   morphine 4 MG/ML injection 4 mg, 4 mg, Intravenous, Q1H PRN, Rhyne, Samantha J, PA-C, 4 mg at 05/14/21 1438   multivitamin with minerals tablet 1 tablet, 1 tablet, Oral, Daily, Dagoberto Ligas, PA-C, 1 tablet at 05/15/21 1043   nicotine polacrilex (NICORETTE) gum 2 mg, 2 mg, Oral, PRN, Rhyne, Samantha J, PA-C   ondansetron (ZOFRAN) injection 4 mg, 4 mg, Intravenous, Q6H PRN, Rhyne, Samantha J, PA-C   oxybutynin (DITROPAN) tablet 2.5 mg, 2.5 mg, Oral, BID, Rhyne, Samantha J, PA-C, 2.5 mg at 05/15/21 1047   oxyCODONE (Oxy IR/ROXICODONE) immediate release tablet 5-10 mg, 5-10 mg, Oral, Q4H PRN, Rhyne, Samantha J, PA-C, 10 mg at 05/15/21 1044   pantoprazole (PROTONIX) EC tablet 40 mg, 40 mg, Oral, Daily, Rhyne, Samantha J, PA-C, 40 mg at 05/15/21 1043   phenol (CHLORASEPTIC) mouth spray 1 spray, 1 spray, Mouth/Throat, PRN, Rhyne, Samantha J, PA-C   polyethylene glycol (MIRALAX / GLYCOLAX) packet 17 g, 17 g, Oral, Daily PRN, Rhyne, Samantha J, PA-C   potassium chloride SA (KLOR-CON) CR tablet 20-40 mEq, 20-40 mEq, Oral,  Daily PRN, Rhyne, Samantha J, PA-C   senna-docusate (Senokot-S) tablet 1 tablet, 1 tablet, Oral, BID, Serafina Mitchell, MD, 1 tablet at 05/15/21 1043   sodium chloride flush (NS) 0.9 % injection 3 mL, 3 mL, Intravenous, Q12H, Rhyne, Samantha J, PA-C, 3 mL at 05/15/21 1055   sodium chloride flush (NS) 0.9 % injection 3 mL, 3 mL, Intravenous, PRN, Rhyne, Samantha J, PA-C   thiamine tablet 100 mg, 100 mg, Oral, Daily, 100 mg at 05/15/21 1043 **OR** thiamine (B-1) injection 100 mg, 100 mg, Intravenous, Daily, Eveland, Matthew, PA-C   vitamin B-12 (CYANOCOBALAMIN) tablet 1,000 mcg, 1,000 mcg, Oral, Daily, Rhyne, Samantha J, PA-C, 1,000 mcg at 05/15/21 1043  Patients Current Diet:  Diet Order  Diet Heart Room service appropriate? Yes; Fluid consistency: Thin  Diet effective now                   Precautions / Restrictions Precautions Precautions: Fall Restrictions Weight Bearing Restrictions: Yes LLE Weight Bearing: Non weight bearing   Has the patient had 2 or more falls or a fall with injury in the past year? No  Prior Activity Level Community (5-7x/wk): Mod I with RW  Prior Functional Level Self Care: Did the patient need help bathing, dressing, using the toilet or eating? Independent  Indoor Mobility: Did the patient need assistance with walking from room to room (with or without device)? Independent  Stairs: Did the patient need assistance with internal or external stairs (with or without device)? Independent  Functional Cognition: Did the patient need help planning regular tasks such as shopping or remembering to take medications? Independent  Home Assistive Devices / Equipment Home Assistive Devices/Equipment: None Home Equipment: None  Prior Device Use: Indicate devices/aids used by the patient prior to current illness, exacerbation or injury? Walker  Current Functional Level Cognition  Overall Cognitive Status: Difficult to assess Difficult to assess  due to: Level of arousal Orientation Level: Oriented X4    Extremity Assessment (includes Sensation/Coordination)  Upper Extremity Assessment: Generalized weakness  Lower Extremity Assessment: Generalized weakness    ADLs  Overall ADL's : Needs assistance/impaired Eating/Feeding: Independent Grooming: Set up, Sitting Upper Body Bathing: Set up, Sitting Lower Body Bathing: Moderate assistance, Sitting/lateral leans, Bed level Upper Body Dressing : Set up, Sitting Lower Body Dressing: Moderate assistance, Sitting/lateral leans, Bed level Toilet Transfer: Moderate assistance, Stand-pivot, Cueing for safety, Cueing for sequencing, BSC, RW Toilet Transfer Details (indicate cue type and reason): to recliner, bed height elevated and mod A to power up to standing. patient able to pivot and take small hop on R foot however poor eccentric control into chair needing heavy mod A to safely sit into recliner Toileting- Clothing Manipulation and Hygiene: Total assistance Functional mobility during ADLs: Moderate assistance, Cueing for safety, Cueing for sequencing, Rolling walker General ADL Comments: patient needing increased assistance with self care tasks due to new BKA impacting balance, pain level, activity tolerance, safety    Mobility  Overal bed mobility: Needs Assistance Bed Mobility: Supine to Sit Supine to sit: Min assist, Mod assist Sit to supine: Min guard General bed mobility comments: Repetitve cues for direction due to lethargic.  Worked on bridging to EOB, then  transition up via R elbow to EOB.    Transfers  Overall transfer level: Needs assistance Equipment used: Rolling walker (2 wheeled) Transfers: Sit to/from Stand, [employer] Transfers Sit to Stand: Mod assist Stand pivot transfers: Mod assist General transfer comment: face to face assist in lieu of the RW due to pt's level of arousal/lethargic from meds.    Ambulation / Gait / Stairs / Wheelchair Mobility   Ambulation/Gait Ambulation/Gait assistance: Mod assist Gait Distance (Feet): 4 Feet (forward and back with the RW) Assistive device: Rolling walker (2 wheeled) Gait Pattern/deviations: Step-to pattern General Gait Details: moderate cuing to get pt understanding "swing to " on UE's, moderate stability assist.    Posture / Balance Balance Overall balance assessment: Needs assistance Sitting-balance support: Feet supported Sitting balance-Leahy Scale: Fair Standing balance support: Single extremity supported, Bilateral upper extremity supported, During functional activity Standing balance-Leahy Scale: Poor Standing balance comment: reliant on UE support and mod A    Special needs/care consideration ETOH abuse, monitor per CIWA protocol  Previous Home Environment  Living Arrangements: Spouse/significant other  Lives With: Spouse Available Help at Discharge: Family (spouse works but taking FMLA) Type of Home: House Home Layout: Multi-level Alternate Level Stairs-Number of Steps: 4 steps to bedroom Home Access: Stairs to enter Entrance Stairs-Rails: Right, Left Entrance Stairs-Number of Steps: 4 Bathroom Shower/Tub: Chiropodist: Standard Bathroom Accessibility: Yes How Accessible: Accessible via walker Pioneer: No  Discharge Living Setting Plans for Discharge Living Setting: Patient's home, Lives with (comment) (wife) Type of Home at Discharge: House Discharge Home Layout: Multi-level Alternate Level Stairs-Number of Steps: 4 steps to bedroom Discharge Home Access: Stairs to enter Entrance Stairs-Rails: Right, Left, Can reach both Entrance Stairs-Number of Steps: 4 Discharge Bathroom Shower/Tub: Tub/shower unit Discharge Bathroom Toilet: Standard Discharge Bathroom Accessibility: Yes How Accessible: Accessible via walker Does the patient have any problems obtaining your medications?: No  Social/Family/Support Systems Patient Roles:  Spouse Contact Information: wife Anticipated Caregiver: wife Anticipated Caregiver's Contact Information: see above Ability/Limitations of Caregiver: wife works but taking Field seismologist Availability: 24/7 Discharge Plan Discussed with Primary Caregiver: Yes Is Caregiver In Agreement with Plan?: Yes Does Caregiver/Family have Issues with Lodging/Transportation while Pt is in Rehab?: No  Goals Patient/Family Goal for Rehab: Mod I to superivsion with PT and OT Expected length of stay: ELOS 7 to 10 days Pt/Family Agrees to Admission and willing to participate: Yes Program Orientation Provided & Reviewed with Pt/Caregiver Including Roles  & Responsibilities: Yes  Decrease burden of Care through IP rehab admission: n/a  Possible need for SNF placement upon discharge: not anticipated  Patient Condition: I have reviewed medical records from [facility] , spoken with CM, and patient and spouse. I met with patient at the bedside for inpatient rehabilitation assessment.  Patient will benefit from ongoing PT and OT, can actively participate in 3 hours of therapy a day 5 days of the week, and can make measurable gains during the admission.  Patient will also benefit from the coordinated team approach during an Inpatient Acute Rehabilitation admission.  The patient will receive intensive therapy as well as Rehabilitation physician, nursing, social worker, and care management interventions.  Due to bladder management, bowel management, safety, skin/wound care, disease management, medication administration, pain management, and patient education the patient requires 24 hour a day rehabilitation nursing.  The patient is currently mod assist overall  with mobility and basic ADLs.  Discharge setting and therapy post discharge at home with home health is anticipated.  Patient has agreed to participate in the Acute Inpatient Rehabilitation Program and will admit today.  Preadmission Screen Completed By:   Cleatrice Burke, 05/15/2021 12:18 PM ______________________________________________________________________   Discussed status with Dr. Naaman Plummer  on 05/15/2021 at 1221 and received approval for admission today.  Admission Coordinator:  Cleatrice Burke, RN, time 1221Date 05/15/2021   Assessment/Plan: Diagnosis: left BKA Does the need for close, 24 hr/day Medical supervision in concert with the patient's rehab needs make it unreasonable for this patient to be served in a less intensive setting? Yes Co-Morbidities requiring supervision/potential complications: ABLA, hyponatremia, post-op pain, dm Due to bladder management, bowel management, safety, skin/wound care, disease management, medication administration, pain management, and patient education, does the patient require 24 hr/day rehab nursing? Yes Does the patient require coordinated care of a physician, rehab nurse, PT, OT to address physical and functional deficits in the context of the above medical diagnosis(es)? Yes Addressing deficits in the following areas: balance, endurance, locomotion, strength, transferring, bowel/bladder control, bathing,  dressing, feeding, grooming, toileting, and psychosocial support Can the patient actively participate in an intensive therapy program of at least 3 hrs of therapy 5 days a week? Yes The potential for patient to make measurable gains while on inpatient rehab is excellent Anticipated functional outcomes upon discharge from inpatient rehab: supervision PT, supervision OT, n/a SLP Estimated rehab length of stay to reach the above functional goals is: 7-10 days Anticipated discharge destination: Home 10. Overall Rehab/Functional Prognosis: excellent   MD Signature: [name], MD, [facility] 05/15/2021

## 2021-05-14 NOTE — Progress Notes (Addendum)
[facility]  Subjective  - no new complaints, comfortable   Objective (!) 135/99 73 98.6 F (37 C) (Oral) 12 99%  Intake/Output Summary (Last 24 hours) at 05/14/2021 0724 Last data filed at 05/14/2021 0536 Gross per 24 hour  Intake 2420.38 ml  Output 450 ml  Net 1970.38 ml    Left BKA limb gard in place with shrinker sock underneath Right foot motor and sensation intact, no evidence of ischemia  Angiogram:05/12/21 Right Lower Extremity: There is a subtotal occlusion with heavy calcification within the right common femoral artery.  The profundofemoral artery is patent throughout its course.  The superficial femoral artery is also patent however there is severe stenosis within the adductor canal.  The popliteal artery is patent however there is limited visualization of the tibial vessels secondary to proximal disease.  There does appear to be two-vessel runoff via the posterior tibial and peroneal artery.   Assessment/Planning: POD # 1 left BKA Right LE stable without non healing wounds will f/u as OP  Limb guard and dressing will be maintained for 7 days.  If he is still here we will change the dressing if he is D/C'd he will f/u in our office in 1 week for dressing change Stable disposition post op  Mosetta Pigeon 05/14/2021 7:24 AM --  Laboratory Lab Results: Recent Labs    05/13/21 0218 05/14/21 0107  WBC 13.0* 6.4  HGB 8.3* 7.0*  HCT 26.1* 22.5*  PLT 437* 382   BMET Recent Labs    05/13/21 0218 05/14/21 0107  NA 131* 130*  K 4.7 4.0  CL 94* 96*  CO2 26 27  GLUCOSE 118* 116*  BUN 28* 21  CREATININE 0.94 0.83  CALCIUM 10.5* 9.6    COAG No results found for: INR, PROTIME No results found for: PTT   I agree with the above.;  Hi [place] is well controlled.  We will change dressing prior to d/c which hopefully will be to rehab.  Will need right femoral endarterectomy and iliac stent once he has recovered  [name]

## 2021-05-15 ENCOUNTER — Inpatient Hospital Stay (HOSPITAL_COMMUNITY)
Admission: RE | Admit: 2021-05-15 | Discharge: 2021-05-20 | DRG: 560 | Disposition: A | Discharge status: Home Health | Payer: No Typology Code available for payment source | Source: Intra-hospital | Attending: Physical Medicine & Rehabilitation | Admitting: Physical Medicine & Rehabilitation

## 2021-05-15 ENCOUNTER — Inpatient Hospital Stay (HOSPITAL_COMMUNITY): Payer: No Typology Code available for payment source

## 2021-05-15 ENCOUNTER — Encounter (HOSPITAL_COMMUNITY): Payer: Self-pay | Admitting: Physical Medicine & Rehabilitation

## 2021-05-15 ENCOUNTER — Other Ambulatory Visit: Payer: Self-pay

## 2021-05-15 DIAGNOSIS — E119 Type 2 diabetes mellitus without complications: Secondary | ICD-10-CM | POA: Diagnosis not present

## 2021-05-15 DIAGNOSIS — G546 Phantom limb syndrome with pain: Secondary | ICD-10-CM | POA: Diagnosis present

## 2021-05-15 DIAGNOSIS — N390 Urinary tract infection, site not specified: Secondary | ICD-10-CM | POA: Diagnosis not present

## 2021-05-15 DIAGNOSIS — I739 Peripheral vascular disease, unspecified: Secondary | ICD-10-CM

## 2021-05-15 DIAGNOSIS — Z7982 Long term (current) use of aspirin: Secondary | ICD-10-CM

## 2021-05-15 DIAGNOSIS — D62 Acute posthemorrhagic anemia: Secondary | ICD-10-CM | POA: Diagnosis present

## 2021-05-15 DIAGNOSIS — Z89612 Acquired absence of left leg above knee: Secondary | ICD-10-CM | POA: Diagnosis not present

## 2021-05-15 DIAGNOSIS — M25552 Pain in left hip: Secondary | ICD-10-CM

## 2021-05-15 DIAGNOSIS — F17210 Nicotine dependence, cigarettes, uncomplicated: Secondary | ICD-10-CM | POA: Diagnosis present

## 2021-05-15 DIAGNOSIS — E785 Hyperlipidemia, unspecified: Secondary | ICD-10-CM | POA: Diagnosis present

## 2021-05-15 DIAGNOSIS — R400 Somnolence: Secondary | ICD-10-CM | POA: Diagnosis not present

## 2021-05-15 DIAGNOSIS — Z7984 Long term (current) use of oral hypoglycemic drugs: Secondary | ICD-10-CM

## 2021-05-15 DIAGNOSIS — Z89512 Acquired absence of left leg below knee: Secondary | ICD-10-CM | POA: Diagnosis not present

## 2021-05-15 DIAGNOSIS — G4700 Insomnia, unspecified: Secondary | ICD-10-CM | POA: Diagnosis not present

## 2021-05-15 DIAGNOSIS — Z4781 Encounter for orthopedic aftercare following surgical amputation: Secondary | ICD-10-CM | POA: Diagnosis present

## 2021-05-15 DIAGNOSIS — E1151 Type 2 diabetes mellitus with diabetic peripheral angiopathy without gangrene: Secondary | ICD-10-CM | POA: Diagnosis present

## 2021-05-15 DIAGNOSIS — I10 Essential (primary) hypertension: Secondary | ICD-10-CM

## 2021-05-15 DIAGNOSIS — Z888 Allergy status to other drugs, medicaments and biological substances status: Secondary | ICD-10-CM

## 2021-05-15 DIAGNOSIS — Z79899 Other long term (current) drug therapy: Secondary | ICD-10-CM

## 2021-05-15 DIAGNOSIS — E871 Hypo-osmolality and hyponatremia: Secondary | ICD-10-CM | POA: Diagnosis present

## 2021-05-15 LAB — CBC
HCT: 26.9 % — ABNORMAL LOW (ref 39.0–52.0)
Hemoglobin: 8.5 g/dL — ABNORMAL LOW (ref 13.0–17.0)
MCH: 28.1 pg (ref 26.0–34.0)
MCHC: 31.6 g/dL (ref 30.0–36.0)
MCV: 88.8 fL (ref 80.0–100.0)
Platelets: 462 10*3/uL — ABNORMAL HIGH (ref 150–400)
RBC: 3.03 MIL/uL — ABNORMAL LOW (ref 4.22–5.81)
RDW: 12.5 % (ref 11.5–15.5)
WBC: 8.3 10*3/uL (ref 4.0–10.5)
nRBC: 0 % (ref 0.0–0.2)

## 2021-05-15 LAB — SURGICAL PATHOLOGY

## 2021-05-15 LAB — BASIC METABOLIC PANEL
Anion gap: 9 (ref 5–15)
BUN: 14 mg/dL (ref 8–23)
CO2: 27 mmol/L (ref 22–32)
Calcium: 10.4 mg/dL — ABNORMAL HIGH (ref 8.9–10.3)
Chloride: 92 mmol/L — ABNORMAL LOW (ref 98–111)
Creatinine, Ser: 0.82 mg/dL (ref 0.61–1.24)
GFR, Estimated: >60 mL/min (ref >60)
Glucose, Bld: 127 mg/dL — ABNORMAL HIGH (ref 70–99)
Potassium: 5 mmol/L (ref 3.5–5.1)
Sodium: 128 mmol/L — ABNORMAL LOW (ref 135–145)

## 2021-05-15 LAB — GLUCOSE, CAPILLARY
Glucose-Capillary: 144 mg/dL — ABNORMAL HIGH (ref 70–99)
Glucose-Capillary: 104 mg/dL — ABNORMAL HIGH (ref 70–99)
Glucose-Capillary: 128 mg/dL — ABNORMAL HIGH (ref 70–99)
Glucose-Capillary: 112 mg/dL — ABNORMAL HIGH (ref 70–99)

## 2021-05-15 MED ORDER — DOCUSATE SODIUM 100 MG PO CAPS
100.0000 mg | ORAL_CAPSULE | Freq: Every day | ORAL | Status: DC
  Administered 2021-05-16 – 2021-05-20 (×5): 100 mg via ORAL
  Filled 2021-05-15 (×5): qty 1

## 2021-05-15 MED ORDER — ATORVASTATIN CALCIUM 10 MG PO TABS
20.0000 mg | ORAL_TABLET | Freq: Every day | ORAL | Status: DC
  Administered 2021-05-15 – 2021-05-19 (×5): 20 mg via ORAL
  Filled 2021-05-15 (×5): qty 2

## 2021-05-15 MED ORDER — AMLODIPINE BESYLATE 10 MG PO TABS
10.0000 mg | ORAL_TABLET | Freq: Every day | ORAL | Status: DC
  Administered 2021-05-16 – 2021-05-20 (×5): 10 mg via ORAL
  Filled 2021-05-15 (×5): qty 1

## 2021-05-15 MED ORDER — ACETAMINOPHEN 325 MG PO TABS
650.0000 mg | ORAL_TABLET | ORAL | Status: DC | PRN
  Administered 2021-05-17 – 2021-05-18 (×3): 650 mg via ORAL
  Filled 2021-05-15 (×3): qty 2

## 2021-05-15 MED ORDER — HEPARIN SODIUM (PORCINE) 5000 UNIT/ML IJ SOLN: 5000.0000 [IU] | Freq: Three times a day (TID) | INTRAMUSCULAR | Status: DC

## 2021-05-15 MED ORDER — FOLIC ACID 1 MG PO TABS
1.0000 mg | ORAL_TABLET | Freq: Every day | ORAL | Status: DC
  Administered 2021-05-16 – 2021-05-20 (×5): 1 mg via ORAL
  Filled 2021-05-15 (×5): qty 1

## 2021-05-15 MED ORDER — THIAMINE HCL 100 MG PO TABS
100.0000 mg | ORAL_TABLET | Freq: Every day | ORAL | Status: DC
  Administered 2021-05-16 – 2021-05-20 (×5): 100 mg via ORAL
  Filled 2021-05-15 (×5): qty 1

## 2021-05-15 MED ORDER — OXYCODONE HCL 5 MG PO TABS
5.0000 mg | ORAL_TABLET | ORAL | Status: DC | PRN
  Administered 2021-05-15 – 2021-05-16 (×2): 5 mg via ORAL
  Administered 2021-05-16: 10 mg via ORAL
  Filled 2021-05-15: qty 2
  Filled 2021-05-15 (×2): qty 1

## 2021-05-15 MED ORDER — POLYETHYLENE GLYCOL 3350 17 G PO PACK: 17.0000 g | PACK | Freq: Every day | ORAL | Status: DC | PRN

## 2021-05-15 MED ORDER — ASPIRIN EC 81 MG PO TBEC
81.0000 mg | DELAYED_RELEASE_TABLET | Freq: Every day | ORAL | Status: DC
  Administered 2021-05-16 – 2021-05-20 (×5): 81 mg via ORAL
  Filled 2021-05-15 (×5): qty 1

## 2021-05-15 MED ORDER — INSULIN ASPART 100 UNIT/ML IJ SOLN
0.0000 [IU] | Freq: Three times a day (TID) | INTRAMUSCULAR | Status: DC
  Administered 2021-05-15: 1 [IU] via SUBCUTANEOUS
  Administered 2021-05-17 (×2): 2 [IU] via SUBCUTANEOUS
  Administered 2021-05-18: 1 [IU] via SUBCUTANEOUS
  Administered 2021-05-18: 2 [IU] via SUBCUTANEOUS
  Administered 2021-05-18: 1 [IU] via SUBCUTANEOUS

## 2021-05-15 MED ORDER — NICOTINE POLACRILEX 2 MG MT GUM
2.0000 mg | CHEWING_GUM | OROMUCOSAL | Status: DC | PRN
  Administered 2021-05-16: 2 mg via ORAL
  Filled 2021-05-15 (×2): qty 1

## 2021-05-15 MED ORDER — OXYBUTYNIN CHLORIDE 5 MG PO TABS
2.5000 mg | ORAL_TABLET | Freq: Two times a day (BID) | ORAL | Status: DC
  Administered 2021-05-15 – 2021-05-20 (×10): 2.5 mg via ORAL
  Filled 2021-05-15 (×10): qty 1

## 2021-05-15 MED ORDER — ADULT MULTIVITAMIN W/MINERALS CH
1.0000 | ORAL_TABLET | Freq: Every day | ORAL | Status: DC
  Administered 2021-05-16 – 2021-05-20 (×5): 1 via ORAL
  Filled 2021-05-15 (×5): qty 1

## 2021-05-15 MED ORDER — SENNOSIDES-DOCUSATE SODIUM 8.6-50 MG PO TABS
1.0000 | ORAL_TABLET | Freq: Two times a day (BID) | ORAL | Status: DC
  Administered 2021-05-15 – 2021-05-20 (×9): 1 via ORAL
  Filled 2021-05-15 (×9): qty 1

## 2021-05-15 MED ORDER — GABAPENTIN 100 MG PO CAPS
100.0000 mg | ORAL_CAPSULE | Freq: Every day | ORAL | Status: DC
  Administered 2021-05-15 – 2021-05-19 (×5): 100 mg via ORAL
  Filled 2021-05-15 (×5): qty 1

## 2021-05-15 MED ORDER — THIAMINE HCL 100 MG/ML IJ SOLN
100.0000 mg | Freq: Every day | INTRAMUSCULAR | Status: DC
  Filled 2021-05-15 (×4): qty 2

## 2021-05-15 MED ORDER — HYDROCHLOROTHIAZIDE 25 MG PO TABS
25.0000 mg | ORAL_TABLET | Freq: Every day | ORAL | Status: DC
  Administered 2021-05-16 – 2021-05-18 (×3): 25 mg via ORAL
  Filled 2021-05-15 (×3): qty 1

## 2021-05-15 MED ORDER — HEPARIN SODIUM (PORCINE) 5000 UNIT/ML IJ SOLN
5000.0000 [IU] | Freq: Three times a day (TID) | INTRAMUSCULAR | Status: DC
  Administered 2021-05-15 – 2021-05-19 (×13): 5000 [IU] via SUBCUTANEOUS
  Filled 2021-05-15 (×13): qty 1

## 2021-05-15 MED ORDER — PANTOPRAZOLE SODIUM 40 MG PO TBEC
40.0000 mg | DELAYED_RELEASE_TABLET | Freq: Every day | ORAL | Status: DC
  Administered 2021-05-16 – 2021-05-20 (×5): 40 mg via ORAL
  Filled 2021-05-15 (×5): qty 1

## 2021-05-15 MED ORDER — BISACODYL 10 MG RE SUPP: 10.0000 mg | Freq: Every day | RECTAL | Status: DC | PRN

## 2021-05-15 MED ORDER — ATENOLOL 25 MG PO TABS
25.0000 mg | ORAL_TABLET | Freq: Two times a day (BID) | ORAL | Status: DC
  Administered 2021-05-15 – 2021-05-20 (×10): 25 mg via ORAL
  Filled 2021-05-15 (×10): qty 1

## 2021-05-15 NOTE — Progress Notes (Signed)
Inpatient Rehabilitation Medication Review by a Pharmacist  A complete drug regimen review was completed for this patient to identify any potential clinically significant medication issues.  Clinically significant medication issues were identified:  yes   Type of Medication Issue Identified Description of Issue Urgent (address now) Non-Urgent (address on AM team rounds) Plan Plan Accepted by Provider? (Yes / No / Pending AM Rounds)  Drug Interaction(s) (clinically significant)       Duplicate Therapy       Allergy       No Medication Administration End Date       Incorrect Dose       Additional Drug Therapy Needed  Metformin - not cont on admission Methocarbamol - not cont on admit Non-urgent Restart meds as appropriate   Other         Name of provider notified for urgent issues identified:   Provider Method of Notification:    For non-urgent medication issues to be resolved on team rounds tomorrow morning a [facility] Secure Chat Handoff was sent to: [facility]   Pharmacist comments:   Time spent performing this drug regimen review (minutes):  2 minutes   Todd Duran 05/15/2021 10:02 PM

## 2021-05-15 NOTE — Discharge Summary (Addendum)
Discharge Summary    Todd Duran 1946/10/21 75 y.o. male  [id number]  Admission Date: 05/12/2021  Discharge Date: 05/15/2021 Physician: Nada Libman, MD  Admission Diagnosis: PAD (peripheral artery disease) (HCC) [I73.9]   HPI:   This is a 75 y.o. male with ischemic left foot and severe disease on the right who comes in today for angiographic evaluation  Hospital Course:  The patient was admitted to the hospital and taken to the cath lab on 05/12/2021 and underwent: Abdominal aortogram with Bilateral lower extremity runoff Intervention: None   Impression:             #1  Left common femoral artery occlusion.  Left superficial femoral artery occlusion at the adductor canal with popliteal reconstitution and three-vessel runoff             #2  Right iliac stenosis, 90%             #3  Severe right common femoral stenosis along with severe right superficial femoral artery stenosis             #4  The patient will require left below-knee amputation.  At a later date he will be considered for right femoral endarterectomy and iliac stenting, with possible superficial femoral artery stenting.  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     The pt tolerated the procedure well and was transported to the PACU in excellent condition.   He tolerated the procedure well. His vital signs remained stable and he was a febrile. He was place in shrinker sock with limb protecter. On POD 2, his dressign was removed and his incision line was intact. There were no signs of infection. The flaps were warm and well perfused. Arragnements were made for discharge to CIR.  The remainder of the hospital course consisted of increasing mobilization and increasing intake of solids without difficulty.  CBC    Component Value Date/Time   WBC 8.3 05/15/2021 0147   RBC 3.03 (L) 05/15/2021 0147   HGB 8.5 (L) 05/15/2021 0147   HCT 26.9 (L) 05/15/2021 0147   PLT 462 (H) 05/15/2021 0147   MCV 88.8 05/15/2021  0147   MCH 28.1 05/15/2021 0147   MCHC 31.6 05/15/2021 0147   RDW 12.5 05/15/2021 0147    BMET    Component Value Date/Time   NA 128 (L) 05/15/2021 0147   K 5.0 05/15/2021 0147   CL 92 (L) 05/15/2021 0147   CO2 27 05/15/2021 0147   GLUCOSE 127 (H) 05/15/2021 0147   BUN 14 05/15/2021 0147   CREATININE 0.82 05/15/2021 0147   CALCIUM 10.4 (H) 05/15/2021 0147   GFRNONAA >60 05/15/2021 0147      Discharge Instructions     Discharge patient   Complete by: As directed    Discharge disposition: 03-Skilled Nursing Facility   Discharge patient date: 05/15/2021       Discharge Diagnosis:  PAD (peripheral artery disease) (HCC) [I73.9]  Secondary Diagnosis: Patient Active Problem List   Diagnosis Date Noted   PAD (peripheral artery disease) (HCC) 05/12/2021   Past Medical History:  Diagnosis Date   Diabetes mellitus without complication (HCC)    Hypertension      Allergies as of 05/15/2021       Reactions   Lisinopril Swelling   Swelling of lips        Medication List     TAKE these medications    amLODipine 10 MG tablet Commonly known as: NORVASC Take 10  mg by mouth daily.   aspirin 325 MG EC tablet Take 325 mg by mouth daily.   atenolol 100 MG tablet Commonly known as: TENORMIN Take 100 mg by mouth daily.   atorvastatin 20 MG tablet Commonly known as: LIPITOR Take 20 mg by mouth at bedtime.   hydrochlorothiazide 25 MG tablet Commonly known as: HYDRODIURIL Take 25 mg by mouth daily.   metFORMIN 500 MG tablet Commonly known as: GLUCOPHAGE Take 500 mg by mouth daily with breakfast.   methocarbamol 750 MG tablet Commonly known as: ROBAXIN Take 750 mg by mouth 3 (three) times daily.   oxybutynin 5 MG tablet Commonly known as: DITROPAN Take 5 mg by mouth 2 (two) times daily.   oxyCODONE-acetaminophen 5-325 MG tablet Commonly known as: PERCOCET/ROXICET Take 1 tablet by mouth every 4 (four) hours as needed for severe pain.   vitamin B-12 1000  MCG tablet Commonly known as: CYANOCOBALAMIN Take 1,000 mcg by mouth daily.          Instructions:  [facility]  Discharge Instructions  Lower Extremity Angiogram; Angioplasty/Stenting  Please refer to the following instructions for your post-procedure care. Your surgeon or physician assistant will discuss any changes with you.  Activity  Avoid lifting more than 8 pounds (1 gallons of milk) for 72 hours (3 days) after your procedure. You may walk as much as you can tolerate. It's OK to drive after 72 hours.  Bathing/Showering  You may shower the day after your procedure. If you have a bandage, you may remove it at 24- 48 hours. Clean your incision site with mild soap and water. Pat the area dry with a clean towel.  Diet  Resume your pre-procedure diet. There are no special food restrictions following this procedure. All patients with peripheral vascular disease should follow a low fat/low cholesterol diet. In order to heal from your surgery, it is CRITICAL to get adequate nutrition. Your body requires vitamins, minerals, and protein. Vegetables are the best source of vitamins and minerals. Vegetables also provide the perfect balance of protein. Processed food has little nutritional value, so try to avoid this.  Medications  Resume taking all of your medications unless your doctor tells you not to. If your incision is causing pain, you may take over-the-counter pain relievers such as acetaminophen (Tylenol)  Follow Up  Follow up will be arranged at the time of your procedure. You may have an office visit scheduled or may be scheduled for surgery. Ask your surgeon if you have any questions.  Please call us immediately for any of the following conditions: Severe or worsening pain your legs or feet at rest or with walking. Increased pain, redness, drainage at your groin puncture site. Fever of 101 degrees or higher. If you have any mild or slow  bleeding from your puncture site: lie down, apply firm constant pressure over the area with a piece of gauze or a clean wash cloth for 30 minutes- no peeking!, call 911 right away if you are still bleeding after 30 minutes, or if the bleeding is heavy and unmanageable.  Reduce your risk factors of vascular disease:  Stop smoking. If you would like help call [facility] at 1-800-QUIT-NOW (941 382 9197) or [place] at [phone]. Manage your cholesterol Maintain a desired weight Control your diabetes Keep your blood pressure down  If you have any questions, please call the office at [phone]  Prescriptions given Continue aspirin and statin  Disposition: CIR  Patient's condition:  Excellent  Follow up: 1. Dr. Myra Gianotti  in 4 weeks    Wendi Maya, PA-C [facility] [phone] 05/15/2021  1:31 PM   Patient seen and examined with wife and sister at bedside.  Plan for d/c to rehab  [name]

## 2021-05-15 NOTE — Progress Notes (Signed)
Patient ID: Todd Duran, male   DOB: 08/27/1946, 75 y.o.   MRN: [id number]  Met with patient, spouse, and sister. Introduced myself and explained my role in his care. Explained how the rehab process works, how therapy works, and how scheduling works. Explained who the MD, PA, and SW were. Told the family what type of clothing would be appropriate and manageable for him while doing therapy. I answered all questions they had to the best of my ability. I will continue to monitor this patient.  Dorthula Nettles, RN3, BSN, CBIS, [place], [facility], Inpatient Rehabilitation Office [phone] Cell [phone]

## 2021-05-15 NOTE — H&P (Signed)
Physical Medicine and Rehabilitation Admission H&P       HPI: Todd Duran. Todd Duran is a 75 year old right-handed male with history of diabetes well is hypertension and tobacco/alcohol abuse.  Per chart review patient lives with spouse.  Independent prior to admission.  Multilevel home 4 steps to entry.  Spouse works during the day.  Presented 05/13/2021 with progressive left foot pain x2 months with ischemic changes and findings of decreased femoral pulses bilaterally.  Underwent ultrasound-guided left femoral artery with abdominal aortogram showing bilateral renal artery stenosis.  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  Limb was not felt to be salvageable.  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 Myra Gianotti.  Hospital course anemia 8.5 as well as mild hyponatremia 130-131.  He was cleared to begin subcutaneous heparin for DVT prophylaxis.  Therapy evaluations completed due to patient decreased functional mobility was admitted for a comprehensive rehab program.   Review of Systems Constitutional:  Negative for chills and fever. HENT:  Negative for hearing loss.   Eyes:  Negative for blurred vision and double vision. Respiratory:  Negative for cough and shortness of breath.   Cardiovascular:  Positive for leg swelling. Negative for chest pain and palpitations. Gastrointestinal:  Positive for constipation. Negative for heartburn, nausea and vomiting. Genitourinary:  Positive for urgency. Negative for dysuria and hematuria. Musculoskeletal:  Positive for joint pain and myalgias. Skin:  Negative for rash.  All other systems reviewed and are negative.     Past Medical History:  Diagnosis Date   Diabetes mellitus without complication (HCC)     Hypertension           Past Surgical History:  Procedure Laterality Date   ABDOMINAL AORTOGRAM W/LOWER EXTREMITY N/A 05/12/2021    Procedure: ABDOMINAL AORTOGRAM W/LOWER EXTREMITY;  Surgeon: Nada Libman, MD;  Location: [facility];  Service: Cardiovascular;  Laterality: N/A;   AMPUTATION Left 05/13/2021    Procedure: AMPUTATION BELOW KNEE LEFT ;  Surgeon: Nada Libman, MD;  Location: [facility] OR;  Service: Vascular;  Laterality: Left;   HERNIA REPAIR       KNEE SURGERY        History reviewed. No pertinent family history. Social History:  reports that he has been smoking cigarettes. He has been smoking an average of 0.50 packs per day. He has never used smokeless tobacco. No history on file for alcohol use and drug use. Allergies:       Allergies  Allergen Reactions   Lisinopril Swelling      Swelling of lips          Medications Prior to Admission  Medication Sig Dispense Refill   amLODipine (NORVASC) 10 MG tablet Take 10 mg by mouth daily.       aspirin 325 MG EC tablet Take 325 mg by mouth daily.       atenolol (TENORMIN) 100 MG tablet Take 100 mg by mouth daily.       atorvastatin (LIPITOR) 20 MG tablet Take 20 mg by mouth at bedtime.       hydrochlorothiazide (HYDRODIURIL) 25 MG tablet Take 25 mg by mouth daily.       metFORMIN (GLUCOPHAGE) 500 MG tablet Take 500 mg by mouth daily with breakfast.       methocarbamol (ROBAXIN) 750 MG tablet Take 750 mg by mouth 3 (three) times daily.       oxybutynin (DITROPAN) 5 MG tablet Take 5 mg by mouth 2 (two) times  daily.       vitamin B-12 (CYANOCOBALAMIN) 1000 MCG tablet Take 1,000 mcg by mouth daily.       oxyCODONE-acetaminophen (PERCOCET/ROXICET) 5-325 MG tablet Take 1 tablet by mouth every 4 (four) hours as needed for severe pain. 30 tablet 0      Drug Regimen Review Drug regimen was reviewed and remains appropriate with no significant issues identified   Home: Home Living Family/patient expects to be discharged to:: Private residence Living Arrangements: Spouse/significant other Available Help at Discharge: Family (spouse works but taking FMLA) Type of Home: House Home Access: Stairs to enter Secretary/administrator of Steps: 4 Entrance Stairs-Rails:  Right, Left Home Layout: Multi-level Alternate Level Stairs-Number of Steps: 4 steps to bedroom Bathroom Shower/Tub: Engineer, manufacturing systems: Standard Bathroom Accessibility: Yes Home Equipment: None  Lives With: Spouse   Functional History: Prior Function Level of Independence: Independent   Functional Status:  Mobility: Bed Mobility Overal bed mobility: Needs Assistance Bed Mobility: Sit to Supine Supine to sit: Min guard, HOB elevated Sit to supine: Min guard General bed mobility comments: increased time, boost well enough, but needing upper and LE strengthening. Transfers Overall transfer level: Needs assistance Equipment used: Rolling walker (2 wheeled) Transfers: Sit to/from Stand, [employer] Transfers Sit to Stand: Mod assist, From elevated surface, Min assist Stand pivot transfers: Mod assist General transfer comment: Cued/demo'd transfer technique/safety and pivot/"swing to" pattern. Ambulation/Gait Ambulation/Gait assistance: Mod assist Gait Distance (Feet): 4 Feet (forward and back with the RW) Assistive device: Rolling walker (2 wheeled) Gait Pattern/deviations: Step-to pattern General Gait Details: moderate cuing to get pt understanding "swing to " on UE's, moderate stability assist.   ADL: ADL Overall ADL's : Needs assistance/impaired Eating/Feeding: Independent Grooming: Set up, Sitting Upper Body Bathing: Set up, Sitting Lower Body Bathing: Moderate assistance, Sitting/lateral leans, Bed level Upper Body Dressing : Set up, Sitting Lower Body Dressing: Moderate assistance, Sitting/lateral leans, Bed level Toilet Transfer: Moderate assistance, Stand-pivot, Cueing for safety, Cueing for sequencing, BSC, RW Toilet Transfer Details (indicate cue type and reason): to recliner, bed height elevated and mod A to power up to standing. patient able to pivot and take small hop on R foot however poor eccentric control into chair needing heavy mod A to safely  sit into recliner Toileting- Clothing Manipulation and Hygiene: Total assistance Functional mobility during ADLs: Moderate assistance, Cueing for safety, Cueing for sequencing, Rolling walker General ADL Comments: patient needing increased assistance with self care tasks due to new BKA impacting balance, pain level, activity tolerance, safety   Cognition: Cognition Overall Cognitive Status: Within Functional Limits for tasks assessed Orientation Level: Oriented X4 Cognition Arousal/Alertness: Awake/alert Behavior During Therapy: WFL for tasks assessed/performed Overall Cognitive Status: Within Functional Limits for tasks assessed   Physical Exam: Blood pressure (!) 147/84, pulse 94, temperature 98 F (36.7 C), temperature source Oral, resp. rate 14, height  (1.803 m), weight 61.2 kg, SpO2 98 %. Physical Exam Constitutional:      General: He is not in acute distress.    Appearance: He is not ill-appearing. HENT:    Head: Normocephalic.    Nose: Nose normal.    Mouth/Throat:    Mouth: Mucous membranes are moist.    Comments: Missing teeth Eyes:    Extraocular Movements: Extraocular movements intact.    Pupils: Pupils are equal, round, and reactive to light. Cardiovascular:    Rate and Rhythm: Normal rate and regular rhythm.    Heart sounds: No murmur heard.   No gallop.  Pulmonary:    Effort: No respiratory distress. Abdominal:    General: Abdomen is flat. There is no distension.    Palpations: Abdomen is soft.    Tenderness: There is no abdominal tenderness. Musculoskeletal:    Cervical back: Normal range of motion.    Comments: Left residual limb tender, edematous, shrinker in place  Skin:    General: Skin is warm.    Comments: Left BKA site with limb guard in place.  Staples in place, incision well-approximated, skin dry  Neurological:    Mental Status: He is alert.    Comments: Patient is alert in no acute distress.  Oriented x3 and follows commands. UE 5/5. RLE  4/5 prox to distal. Able to lift left leg off bed. No focal sensory findings.  Psychiatric:        Mood and Affect: Mood normal.        Behavior: Behavior normal.        Thought Content: Thought content normal.        Judgment: Judgment normal.     Lab Results Last 48 Hours        Results for orders placed or performed during the hospital encounter of 05/12/21 (from the past 48 hour(s))  Glucose, capillary     Status: Abnormal    Collection Time: 05/13/21  9:55 AM  Result Value Ref Range    Glucose-Capillary 111 (H) 70 - 99 mg/dL      Comment: Glucose reference range applies only to samples taken after fasting for at least 8 hours.  Type and screen [facility]     Status: None    Collection Time: 05/13/21  9:58 AM  Result Value Ref Range    ABO/RH(D) O POS      Antibody Screen NEG      Sample Expiration          05/16/2021,2359 Performed at [facility], [address], [place], [place] [address]    ABO/Rh     Status: None    Collection Time: 05/13/21 10:15 AM  Result Value Ref Range    ABO/RH(D)          O POS Performed at [facility], [address], [place], [place] [address]    Glucose, capillary     Status: Abnormal    Collection Time: 05/13/21 12:47 PM  Result Value Ref Range    Glucose-Capillary 117 (H) 70 - 99 mg/dL      Comment: Glucose reference range applies only to samples taken after fasting for at least 8 hours.  Glucose, capillary     Status: Abnormal    Collection Time: 05/13/21  1:46 PM  Result Value Ref Range    Glucose-Capillary 118 (H) 70 - 99 mg/dL      Comment: Glucose reference range applies only to samples taken after fasting for at least 8 hours.  Glucose, capillary     Status: Abnormal    Collection Time: 05/13/21  4:15 PM  Result Value Ref Range    Glucose-Capillary 256 (H) 70 - 99 mg/dL      Comment: Glucose reference range applies only to samples taken after fasting for at least 8 hours.  Glucose, capillary      Status: Abnormal    Collection Time: 05/13/21  8:19 PM  Result Value Ref Range    Glucose-Capillary 174 (H) 70 - 99 mg/dL      Comment: Glucose reference range applies only to samples taken  after fasting for at least 8 hours.  Basic metabolic panel     Status: Abnormal    Collection Time: 05/14/21  1:07 AM  Result Value Ref Range    Sodium 130 (L) 135 - 145 mmol/L    Potassium 4.0 3.5 - 5.1 mmol/L    Chloride 96 (L) 98 - 111 mmol/L    CO2 27 22 - 32 mmol/L    Glucose, Bld 116 (H) 70 - 99 mg/dL      Comment: Glucose reference range applies only to samples taken after fasting for at least 8 hours.    BUN 21 8 - 23 mg/dL    Creatinine, Ser 1.61 0.61 - 1.24 mg/dL    Calcium 9.6 8.9 - 09.6 mg/dL    GFR, Estimated >04 >54 mL/min      Comment: (NOTE) Calculated using the CKD-EPI Creatinine Equation (2021)      Anion gap 7 5 - 15      Comment: Performed at [facility], [address], [place], [place] [address]  CBC     Status: Abnormal    Collection Time: 05/14/21  1:07 AM  Result Value Ref Range    WBC 6.4 4.0 - 10.5 K/uL    RBC 2.50 (L) 4.22 - 5.81 MIL/uL    Hemoglobin 7.0 (L) 13.0 - 17.0 g/dL    HCT 91.4 (L) 78.2 - 52.0 %    MCV 90.0 80.0 - 100.0 fL    MCH 28.0 26.0 - 34.0 pg    MCHC 31.1 30.0 - 36.0 g/dL    RDW 95.6 21.3 - 08.6 %    Platelets 382 150 - 400 K/uL    nRBC 0.0 0.0 - 0.2 %      Comment: Performed at [facility], [address], [place], [place] [address]  Glucose, capillary     Status: Abnormal    Collection Time: 05/14/21  6:21 AM  Result Value Ref Range    Glucose-Capillary 124 (H) 70 - 99 mg/dL      Comment: Glucose reference range applies only to samples taken after fasting for at least 8 hours.    Comment 1 Document in Chart    Glucose, capillary     Status: Abnormal    Collection Time: 05/14/21 12:05 PM  Result Value Ref Range    Glucose-Capillary 122 (H) 70 - 99 mg/dL      Comment: Glucose reference range applies only to samples taken after  fasting for at least 8 hours.  Comprehensive metabolic panel     Status: Abnormal    Collection Time: 05/14/21  4:12 PM  Result Value Ref Range    Sodium 131 (L) 135 - 145 mmol/L    Potassium 4.5 3.5 - 5.1 mmol/L    Chloride 96 (L) 98 - 111 mmol/L    CO2 25 22 - 32 mmol/L    Glucose, Bld 153 (H) 70 - 99 mg/dL      Comment: Glucose reference range applies only to samples taken after fasting for at least 8 hours.    BUN 17 8 - 23 mg/dL    Creatinine, Ser 9.62 0.61 - 1.24 mg/dL    Calcium 95.2 8.9 - 84.1 mg/dL    Total Protein 7.4 6.5 - 8.1 g/dL    Albumin 2.0 (L) 3.5 - 5.0 g/dL    AST 35 15 - 41 U/L    ALT 16 0 - 44 U/L    Alkaline Phosphatase 111 38 -  126 U/L    Total Bilirubin 0.4 0.3 - 1.2 mg/dL    GFR, Estimated >33 >00 mL/min      Comment: (NOTE) Calculated using the CKD-EPI Creatinine Equation (2021)      Anion gap 10 5 - 15      Comment: Performed at [facility], [address] [place], [place] [address]  Magnesium     Status: Abnormal    Collection Time: 05/14/21  4:12 PM  Result Value Ref Range    Magnesium 1.5 (L) 1.7 - 2.4 mg/dL      Comment: Performed at [facility], [address] [place], [place] [address]  Phosphorus     Status: None    Collection Time: 05/14/21  4:12 PM  Result Value Ref Range    Phosphorus 3.5 2.5 - 4.6 mg/dL      Comment: Performed at [facility], [address], [place], [place] [address]  Glucose, capillary     Status: Abnormal    Collection Time: 05/14/21  5:13 PM  Result Value Ref Range    Glucose-Capillary 127 (H) 70 - 99 mg/dL      Comment: Glucose reference range applies only to samples taken after fasting for at least 8 hours.  Glucose, capillary     Status: Abnormal    Collection Time: 05/14/21  9:03 PM  Result Value Ref Range    Glucose-Capillary 155 (H) 70 - 99 mg/dL      Comment: Glucose reference range applies only to samples taken after fasting for at least 8 hours.  Basic metabolic panel     Status:  Abnormal    Collection Time: 05/15/21  1:47 AM  Result Value Ref Range    Sodium 128 (L) 135 - 145 mmol/L    Potassium 5.0 3.5 - 5.1 mmol/L    Chloride 92 (L) 98 - 111 mmol/L    CO2 27 22 - 32 mmol/L    Glucose, Bld 127 (H) 70 - 99 mg/dL      Comment: Glucose reference range applies only to samples taken after fasting for at least 8 hours.    BUN 14 8 - 23 mg/dL    Creatinine, Ser 3.89 0.61 - 1.24 mg/dL    Calcium 37.3 (H) 8.9 - 10.3 mg/dL    GFR, Estimated >42 >87 mL/min      Comment: (NOTE) Calculated using the CKD-EPI Creatinine Equation (2021)      Anion gap 9 5 - 15      Comment: Performed at [facility], [address], [place], [place] [address]  CBC     Status: Abnormal    Collection Time: 05/15/21  1:47 AM  Result Value Ref Range    WBC 8.3 4.0 - 10.5 K/uL    RBC 3.03 (L) 4.22 - 5.81 MIL/uL    Hemoglobin 8.5 (L) 13.0 - 17.0 g/dL    HCT 72.6 (L) 20.3 - 52.0 %    MCV 88.8 80.0 - 100.0 fL    MCH 28.1 26.0 - 34.0 pg    MCHC 31.6 30.0 - 36.0 g/dL    RDW 55.9 74.1 - 63.8 %    Platelets 462 (H) 150 - 400 K/uL    nRBC 0.0 0.0 - 0.2 %      Comment: Performed at [facility], [address] [place], [place] [address]  Glucose, capillary     Status: Abnormal    Collection Time: 05/15/21  6:24 AM  Result Value Ref Range    Glucose-Capillary 144 (H) 70 - 99 mg/dL      Comment: Glucose reference range applies only to samples taken after fasting for at least 8 hours.      Imaging Results (Last 48 hours)  No results found.           Medical Problem List and Plan: 1.  DebilityDebility secondary to left BKA 05/13/2021             -patient may shower if incision is covered             -ELOS/Goals: 7-10 days, supervision with PT and OT  2.  Antithrombotics: -DVT/anticoagulation: Subcutaneous heparin             -antiplatelet therapy: Aspirin 81 mg daily 3. Pain Management: Oxycodone as needed             -pt having phantom limb pain LLE. Will add low dose  gabapentin tonight 100mg  qhs                         -discussed residual limb massage and visual feedback 4. Mood: Provide emotional support             -antipsychotic agents: N/A 5. Neuropsych: This patient is capable of making decisions on own his behalf. 6. Skin/Wound Care: continue shrinker over limb for healing, edema control             -limb guard for protection, promotion of knee extension 7. Fluids/Electrolytes/Nutrition: Routine in and outs with follow-up chemistries 8.  Acute blood loss anemia.  Follow-up CBC 9.  Hypertension.  Norvasc 10 mg daily, Tenormin 25 mg twice daily.  Monitor with increased mobility             -bp controlled at present 10.  Diabetes mellitus.  Hemoglobin A1c 5.2.  SSI.  Patient on Glucophage 500 mg daily prior to admission.   -monitor CBG's and resume as needed 11.  Hyperlipidemia.  Lipitor 12.  Tobacco/alcohol use.  Provide counseling     Charlton AmorDaniel J Angiulli, PA-C 05/15/2021   I have personally performed a face to face diagnostic evaluation of this patient and formulated the key components of the plan.  Additionally, I have personally reviewed laboratory data, imaging studies, as well as relevant notes and concur with the physician assistant's documentation above.  The patient's status has not changed from the original H&P.  Any changes in documentation from the acute care chart have been noted above.  Ranelle OysterZachary T. Littleton Haub, MD, [place] DomFAAPMR

## 2021-05-15 NOTE — Progress Notes (Signed)
[name], MD   Physician  Physical Medicine and Rehabilitation  PMR Pre-admission      Signed  Date of Service:  05/14/2021  4:53 PM       Related encounter: Admission (Discharged) from 05/12/2021 in [facility] 4E CV SURGICAL PROGRESSIVE CARE       Signed          Show:Clear all _0 Written_1 Templated_2 Copied  Added by: _3 Cristina Gong, RN_4 [name], MD   _5 Hover for details                                                                                                                                                                                                                                                                                                                                                                                                                                                           PMR Admission Coordinator Pre-Admission Assessment   Patient: Todd Duran is an 75 y.o., male MRN: [id number] DOB: 11-22-1945 Height: _6  (180.3 cm) Weight: 61.2 kg   Insurance Information HMO:     PPO:      PCP:      IPA:      80/20:      OTHER: PRIMARY:  [employer]   Policy#: [id number]  Subscriber: pt CM Name: Sherlynn Carbon      Phone#: [phone]     Fax#: [phone] Pre-Cert#: TBD approved for 30 days      Employer: Benefits:  Phone #: [phone]     Name: Eff. Date: active      CIR: per VA benefits      SNF: Outpatient:      Co-Pay: Home Health:       Co-Pay: DME:      Co-Pay: Providers: VA network  SECONDARY:       Policy#:      Phone#:   Development worker, community:       Phone#:   The Engineer, petroleum" for patients in Inpatient Rehabilitation Facilities with attached "Privacy Act [place] Records" was provided and verbally reviewed with: N/A    Emergency Contact Information Contact Information       Name Relation Home Work Mobile    [place] Spouse     [phone]    Cobb,Naimo Sister     [phone]    Iasiah, [place] Sister     [phone]           Current Medical History  Patient Admitting Diagnosis: BKA   History of Present Illness: 75 year old male with history of smoking, diabetes and HTN. Progressive wound of hi left foot for 2 months, Presented on 05/12/2021 with the diagnosis of PAD. Angiogram on 7/5 to RLE.There is a subtotal occlusion with heavy calcification within the right common femoral artery.  The profundofemoral artery is patent throughout its course.  The superficial femoral artery is also patent however there is severe stenosis within the adductor canal.  The popliteal artery is patent however there is limited visualization of the tibial vessels secondary to proximal disease.  There does appear to be two-vessel runoff via the posterior tibial and peroneal artery.   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 Limb Guard placed.  He will need right femoral endarterectomy and iliac stent once recovered from L BKA.   Patient's medical record from [facility]  has been reviewed by the rehabilitation admission coordinator and physician.   Past Medical History      Past Medical History:  Diagnosis Date   Diabetes mellitus without complication ([place])     Hypertension        Family History   family history is not on file.   Prior Rehab/Hospitalizations Has the patient had prior rehab or hospitalizations prior to admission? Yes   Has the patient had major surgery during 100 days prior to admission? Yes              Current Medications   Current Facility-Administered Medications:   0.9 %  sodium chloride infusion, 250 mL, Intravenous, PRN, Rhyne, Samantha J, PA-C   0.9 %  sodium chloride infusion, , Intravenous, Continuous, Rhyne, Samantha J, PA-C, Stopped at 05/14/21 0938   acetaminophen (TYLENOL) tablet 650  mg, 650 mg, Oral, Q4H PRN, Rhyne, Samantha J, PA-C   alum & mag hydroxide-simeth (MAALOX/MYLANTA) 200-200-20 MG/5ML suspension 15-30 mL, 15-30 mL, Oral, Q2H PRN, Rhyne, Samantha J, PA-C   amLODipine (NORVASC) tablet 10 mg, 10 mg, Oral, Daily, Rhyne, Samantha J, PA-C, 10 mg at 05/15/21 1043   aspirin EC tablet 81 mg, 81 mg, Oral, Daily, Rhyne, Samantha J, PA-C, 81 mg at 05/15/21 1046   atenolol (TENORMIN) tablet 25 mg, 25 mg, Oral, BID, Rhyne, Samantha J, PA-C, 25 mg at 05/15/21 1043   atorvastatin (LIPITOR) tablet 20 mg, 20 mg, Oral,  QHS, Rhyne, Samantha J, PA-C, 20 mg at 05/14/21 2146   bisacodyl (DULCOLAX) suppository 10 mg, 10 mg, Rectal, Daily PRN, Rhyne, Samantha J, PA-C   docusate sodium (COLACE) capsule 100 mg, 100 mg, Oral, Daily, Rhyne, Samantha J, PA-C, 100 mg at 03/75/43 6067   folic acid (FOLVITE) tablet 1 mg, 1 mg, Oral, Daily, Eveland, Matthew, PA-C, 1 mg at 05/15/21 1044   guaiFENesin-dextromethorphan (ROBITUSSIN DM) 100-10 MG/5ML syrup 15 mL, 15 mL, Oral, Q4H PRN, Rhyne, Samantha J, PA-C   heparin injection 5,000 Units, 5,000 Units, Subcutaneous, Q8H, Rhyne, Samantha J, PA-C, 5,000 Units at 05/15/21 0555   hydrALAZINE (APRESOLINE) injection 5 mg, 5 mg, Intravenous, Q20 Min PRN, Rhyne, Samantha J, PA-C   hydrochlorothiazide (HYDRODIURIL) tablet 25 mg, 25 mg, Oral, Daily, Rhyne, Samantha J, PA-C, 25 mg at 05/15/21 1043   insulin aspart (novoLOG) injection 0-9 Units, 0-9 Units, Subcutaneous, TID WC, Rhyne, Samantha J, PA-C, 1 Units at 05/15/21 7034   labetalol (NORMODYNE) injection 10 mg, 10 mg, Intravenous, Q10 min PRN, Rhyne, Samantha J, PA-C   LORazepam (ATIVAN) tablet 1-4 mg, 1-4 mg, Oral, Q1H PRN **OR** LORazepam (ATIVAN) injection 1-4 mg, 1-4 mg, Intravenous, Q1H PRN, Cameron Proud, Matthew, PA-C   magnesium sulfate IVPB 2 g 50 mL, 2 g, Intravenous, Daily PRN, Rhyne, Samantha J, PA-C   metoprolol tartrate (LOPRESSOR) injection 2-5 mg, 2-5 mg, Intravenous, Q2H PRN, Rhyne, Samantha J,  PA-C   morphine 4 MG/ML injection 4 mg, 4 mg, Intravenous, Q1H PRN, Rhyne, Samantha J, PA-C, 4 mg at 05/14/21 1438   multivitamin with minerals tablet 1 tablet, 1 tablet, Oral, Daily, Dagoberto Ligas, PA-C, 1 tablet at 05/15/21 1043   nicotine polacrilex (NICORETTE) gum 2 mg, 2 mg, Oral, PRN, Rhyne, Samantha J, PA-C   ondansetron (ZOFRAN) injection 4 mg, 4 mg, Intravenous, Q6H PRN, Rhyne, Samantha J, PA-C   oxybutynin (DITROPAN) tablet 2.5 mg, 2.5 mg, Oral, BID, Rhyne, Samantha J, PA-C, 2.5 mg at 05/15/21 1047   oxyCODONE (Oxy IR/ROXICODONE) immediate release tablet 5-10 mg, 5-10 mg, Oral, Q4H PRN, Rhyne, Samantha J, PA-C, 10 mg at 05/15/21 1044   pantoprazole (PROTONIX) EC tablet 40 mg, 40 mg, Oral, Daily, Rhyne, Samantha J, PA-C, 40 mg at 05/15/21 1043   phenol (CHLORASEPTIC) mouth spray 1 spray, 1 spray, Mouth/Throat, PRN, Rhyne, Samantha J, PA-C   polyethylene glycol (MIRALAX / GLYCOLAX) packet 17 g, 17 g, Oral, Daily PRN, Rhyne, Samantha J, PA-C   potassium chloride SA (KLOR-CON) CR tablet 20-40 mEq, 20-40 mEq, Oral, Daily PRN, Rhyne, Samantha J, PA-C   senna-docusate (Senokot-S) tablet 1 tablet, 1 tablet, Oral, BID, Serafina Mitchell, MD, 1 tablet at 05/15/21 1043   sodium chloride flush (NS) 0.9 % injection 3 mL, 3 mL, Intravenous, Q12H, Rhyne, Samantha J, PA-C, 3 mL at 05/15/21 1055   sodium chloride flush (NS) 0.9 % injection 3 mL, 3 mL, Intravenous, PRN, Rhyne, Samantha J, PA-C   thiamine tablet 100 mg, 100 mg, Oral, Daily, 100 mg at 05/15/21 1043 **OR** thiamine (B-1) injection 100 mg, 100 mg, Intravenous, Daily, Eveland, Matthew, PA-C   vitamin B-12 (CYANOCOBALAMIN) tablet 1,000 mcg, 1,000 mcg, Oral, Daily, Rhyne, Samantha J, PA-C, 1,000 mcg at 05/15/21 1043   Patients Current Diet:  Diet Order                  Diet Heart Room service appropriate? Yes; Fluid consistency: Thin  Diet effective now  Precautions / Restrictions Precautions Precautions:  Fall Restrictions Weight Bearing Restrictions: Yes LLE Weight Bearing: Non weight bearing    Has the patient had 2 or more falls or a fall with injury in the past year? No   Prior Activity Level Community (5-7x/wk): Mod I with RW   Prior Functional Level Self Care: Did the patient need help bathing, dressing, using the toilet or eating? Independent   Indoor Mobility: Did the patient need assistance with walking from room to room (with or without device)? Independent   Stairs: Did the patient need assistance with internal or external stairs (with or without device)? Independent   Functional Cognition: Did the patient need help planning regular tasks such as shopping or remembering to take medications? Independent   Home Assistive Devices / Equipment Home Assistive Devices/Equipment: None Home Equipment: None   Prior Device Use: Indicate devices/aids used by the patient prior to current illness, exacerbation or injury? Walker   Current Functional Level Cognition   Overall Cognitive Status: Difficult to assess Difficult to assess due to: Level of arousal Orientation Level: Oriented X4    Extremity Assessment (includes Sensation/Coordination)   Upper Extremity Assessment: Generalized weakness  Lower Extremity Assessment: Generalized weakness     ADLs   Overall ADL's : Needs assistance/impaired Eating/Feeding: Independent Grooming: Set up, Sitting Upper Body Bathing: Set up, Sitting Lower Body Bathing: Moderate assistance, Sitting/lateral leans, Bed level Upper Body Dressing : Set up, Sitting Lower Body Dressing: Moderate assistance, Sitting/lateral leans, Bed level Toilet Transfer: Moderate assistance, Stand-pivot, Cueing for safety, Cueing for sequencing, BSC, RW Toilet Transfer Details (indicate cue type and reason): to recliner, bed height elevated and mod A to power up to standing. patient able to pivot and take small hop on R foot however poor eccentric control into  chair needing heavy mod A to safely sit into recliner Toileting- Clothing Manipulation and Hygiene: Total assistance Functional mobility during ADLs: Moderate assistance, Cueing for safety, Cueing for sequencing, Rolling walker General ADL Comments: patient needing increased assistance with self care tasks due to new BKA impacting balance, pain level, activity tolerance, safety     Mobility   Overal bed mobility: Needs Assistance Bed Mobility: Supine to Sit Supine to sit: Min assist, Mod assist Sit to supine: Min guard General bed mobility comments: Repetitve cues for direction due to lethargic.  Worked on bridging to EOB, then  transition up via R elbow to EOB.     Transfers   Overall transfer level: Needs assistance Equipment used: Rolling walker (2 wheeled) Transfers: Sit to/from Stand, [employer] Transfers Sit to Stand: Mod assist Stand pivot transfers: Mod assist General transfer comment: face to face assist in lieu of the RW due to pt's level of arousal/lethargic from meds.     Ambulation / Gait / Stairs / Wheelchair Mobility   Ambulation/Gait Ambulation/Gait assistance: Mod assist Gait Distance (Feet): 4 Feet (forward and back with the RW) Assistive device: Rolling walker (2 wheeled) Gait Pattern/deviations: Step-to pattern General Gait Details: moderate cuing to get pt understanding "swing to " on UE's, moderate stability assist.     Posture / Balance Balance Overall balance assessment: Needs assistance Sitting-balance support: Feet supported Sitting balance-Leahy Scale: Fair Standing balance support: Single extremity supported, Bilateral upper extremity supported, During functional activity Standing balance-Leahy Scale: Poor Standing balance comment: reliant on UE support and mod A     Special needs/care consideration ETOH abuse, monitor per CIWA protocol    Previous Home Environment  Living Arrangements: Spouse/significant other  Lives With: Spouse Available Help  at Discharge: Family (spouse works but taking FMLA) Type of Home: House Home Layout: Multi-level Alternate Level Stairs-Number of Steps: 4 steps to bedroom Home Access: Stairs to enter Entrance Stairs-Rails: Right, Left Entrance Stairs-Number of Steps: 4 Bathroom Shower/Tub: Chiropodist: Standard Bathroom Accessibility: Yes How Accessible: Accessible via walker [place]: No   Discharge Living Setting Plans for Discharge Living Setting: Patient's home, Lives with (comment) (wife) Type of Home at Discharge: House Discharge Home Layout: Multi-level Alternate Level Stairs-Number of Steps: 4 steps to bedroom Discharge Home Access: Stairs to enter Entrance Stairs-Rails: Right, Left, Can reach both Entrance Stairs-Number of Steps: 4 Discharge Bathroom Shower/Tub: Tub/shower unit Discharge Bathroom Toilet: Standard Discharge Bathroom Accessibility: Yes How Accessible: Accessible via walker Does the patient have any problems obtaining your medications?: No   Social/Family/Support Systems Patient Roles: Spouse Contact Information: wife Anticipated Caregiver: wife Anticipated Caregiver's Contact Information: see above Ability/Limitations of Caregiver: wife works but taking Field seismologist Availability: 24/7 Discharge Plan Discussed with Primary Caregiver: Yes Is Caregiver In Agreement with Plan?: Yes Does Caregiver/Family have Issues with Lodging/Transportation while Pt is in Rehab?: No   Goals Patient/Family Goal for Rehab: Mod I to superivsion with PT and OT Expected length of stay: ELOS 7 to 10 days Pt/Family Agrees to Admission and willing to participate: Yes Program Orientation Provided & Reviewed with Pt/Caregiver Including Roles  & Responsibilities: Yes   Decrease burden of Care through IP rehab admission: n/a   Possible need for SNF placement upon discharge: not anticipated   Patient Condition: I have reviewed medical records from [facility] , spoken with CM, and patient and spouse. I met with patient at the bedside for inpatient rehabilitation assessment.  Patient will benefit from ongoing PT and OT, can actively participate in 3 hours of therapy a day 5 days of the week, and can make measurable gains during the admission.  Patient will also benefit from the coordinated team approach during an Inpatient Acute Rehabilitation admission.  The patient will receive intensive therapy as well as Rehabilitation physician, nursing, social worker, and care management interventions.  Due to bladder management, bowel management, safety, skin/wound care, disease management, medication administration, pain management, and patient education the patient requires 24 hour a day rehabilitation nursing.  The patient is currently mod assist overall  with mobility and basic ADLs.  Discharge setting and therapy post discharge at home with home health is anticipated.  Patient has agreed to participate in the Acute Inpatient Rehabilitation Program and will admit today.   Preadmission Screen Completed By:  Cleatrice Burke, 05/15/2021 12:18 PM ______________________________________________________________________   Discussed status with Dr. Naaman Plummer  on 05/15/2021 at 1221 and received approval for admission today.   Admission Coordinator:  Cleatrice Burke, RN, time 1221Date 05/15/2021    Assessment/Plan: Diagnosis: left BKA Does the need for close, 24 hr/day Medical supervision in concert with the patient's rehab needs make it unreasonable for this patient to be served in a less intensive setting? Yes Co-Morbidities requiring supervision/potential complications: ABLA, hyponatremia, post-op pain, dm Due to bladder management, bowel management, safety, skin/wound care, disease management, medication administration, pain management, and patient education, does the patient require 24 hr/day rehab nursing? Yes Does the patient require coordinated care of  a physician, rehab nurse, PT, OT to address physical and functional deficits in the context of the above medical diagnosis(es)? Yes Addressing deficits in the following areas: balance, endurance, locomotion, strength, transferring, bowel/bladder control,  bathing, dressing, feeding, grooming, toileting, and psychosocial support Can the patient actively participate in an intensive therapy program of at least 3 hrs of therapy 5 days a week? Yes The potential for patient to make measurable gains while on inpatient rehab is excellent Anticipated functional outcomes upon discharge from inpatient rehab: supervision PT, supervision OT, n/a SLP Estimated rehab length of stay to reach the above functional goals is: 7-10 days Anticipated discharge destination: Home 10. Overall Rehab/Functional Prognosis: excellent     MD Signature: [name], MD, [facility] 05/15/2021           Revision History                             Note Details  Author [name], MD File Time 05/15/2021 12:23 PM  Author Type Physician Status Signed  Last Editor [name], MD Service Physical Medicine and Rehabilitation

## 2021-05-15 NOTE — Progress Notes (Signed)
Inpatient Rehabilitation Admissions Coordinator   I have insurance approval and can admit him to CIR today. I met with patient at bedside and he is in agreement. I will contact acute team and TOC to make the arrangements for today.  Danne Baxter, RN, MSN Rehab Admissions Coordinator [phone] 05/15/2021 11:54 AM

## 2021-05-15 NOTE — Progress Notes (Addendum)
   05/15/21 2000  What Happened  Was fall witnessed? No  Was patient injured? Unsure  Patient found on floor  Found by Staff-comment (Mary/NT)  Stated prior activity other (comment), pt in bed and tried to reach for his cell phone  Follow Up  MD notified Dr. Carlis Abbott  Time MD notified 2013  Family notified Yes - comment (wife Magda Paganini)  Time family notified 2037  Additional tests Yes-comment (X ray of L hip ordered)  Adult Fall Risk Assessment  Risk Factor Category (scoring not indicated) Fall has occurred during this admission (document High fall risk)  Age 75  Fall History: Fall within 6 months prior to admission 0  Elimination; Bowel and/or Urine Incontinence 2  Elimination; Bowel and/or Urine Urgency/Frequency 0  Medications: includes PCA/Opiates, Anti-convulsants, Anti-hypertensives, Diuretics, Hypnotics, Laxatives, Sedatives, and Psychotropics 5  Patient Care Equipment 0  Mobility-Assistance 2  Mobility-Gait 2  Mobility-Sensory Deficit 0  Altered awareness of immediate physical environment 0  Impulsiveness 2  Lack of understanding of one's physical/cognitive limitations 0  Total Score 15  Patient Fall Risk Level High fall risk  Adult Fall Risk Interventions  Required Bundle Interventions *See Row Information* High fall risk - low, moderate, and high requirements implemented  Additional Interventions Use of appropriate toileting equipment (bedpan, BSC, etc.)  Screening for Fall Injury Risk (To be completed on HIGH fall risk patients) - Assessing Need for Floor Mats  Risk For Fall Injury- Criteria for Floor Mats Noncompliant with safety precautions  Vitals  Temp 98.5 F (36.9 C)  Temp Source Oral  BP (!) 125/94  MAP (mmHg) 105  BP Location Left Arm  BP Method Automatic  Patient Position (if appropriate) Lying  Pulse Rate (!) 106  Pulse Rate Source Dinamap  Resp 20  Oxygen Therapy  SpO2 95 %  O2 Device Room Air  Pain Assessment  Pain Scale 0-10  Pain Score 5  Pain  Type Acute pain  Pain Location Hip  Pain Orientation Left  Pain Descriptors / Indicators Aching  Pain Frequency Intermittent  Pain Onset Sudden  Pain Intervention(s) Repositioned  Neurological  Neuro (WDL) X  Level of Consciousness Alert  Orientation Level Oriented to person;Oriented to time  Cognition Follows commands  Speech Clear  R Hand Grip Moderate  L Hand Grip Moderate   R Foot Dorsiflexion Moderate  L Foot Dorsiflexion Other (Comment) (bka)  R Foot Plantar Flexion Moderate  L Foot Plantar Flexion Other (Comment) (bka)  LLE Motor Response Purposeful movement  LLE Motor Strength 3  Neuro Symptoms Forgetful  Glasgow Coma Scale  Eye Opening 4  Best Verbal Response (NON-intubated) 4  Best Motor Response 6  Glasgow Coma Scale Score 14  Musculoskeletal  Musculoskeletal (WDL) X  Generalized Weakness Yes  Weight Bearing Restrictions Yes  LLE Weight Bearing NWB  Musculoskeletal Details  LLE BKA  Integumentary  Integumentary (WDL) X  Skin Color Appropriate for ethnicity  Skin Condition Dry  Skin Integrity Amputation  Amputation Location Leg  Amputation Location Orientation Left  Amputation Intervention Other (Comment) (assessed, limb guard)  Skin Turgor Non-tenting  Pain Assessment  Date Pain First Started 05/15/21  Pain Assessment  Work-Related Injury No

## 2021-05-15 NOTE — Progress Notes (Signed)
INPATIENT REHABILITATION ADMISSION NOTE   Arrival Method: wheelchair     Mental Orientation: alert and oriented times 4   Assessment: done   Skin: incision site to left BKA   IV'S: none   Pain: left BKA   Tubes and Drains: none   Safety Measures: fall risk   Vital Signs: done   Height and Weight: done   Rehab Orientation: done by RN   Family: wife and sister at bedside.     Notes:

## 2021-05-15 NOTE — H&P (Signed)
Physical Medicine and Rehabilitation Admission H&P     HPI: Todd Duran. Todd Duran is a 75 year old right-handed male with history of diabetes well is hypertension and tobacco/alcohol abuse.  Per chart review patient lives with spouse.  Independent prior to admission.  Multilevel home 4 steps to entry.  Spouse works during the day.  Presented 05/13/2021 with progressive left foot pain x2 months with ischemic changes and findings of decreased femoral pulses bilaterally.  Underwent ultrasound-guided left femoral artery with abdominal aortogram showing bilateral renal artery stenosis.  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  Limb was not felt to be salvageable.  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 Myra Gianotti.  Hospital course anemia 8.5 as well as mild hyponatremia 130-131.  He was cleared to begin subcutaneous heparin for DVT prophylaxis.  Therapy evaluations completed due to patient decreased functional mobility was admitted for a comprehensive rehab program.  Review of Systems  Constitutional:  Negative for chills and fever.  HENT:  Negative for hearing loss.   Eyes:  Negative for blurred vision and double vision.  Respiratory:  Negative for cough and shortness of breath.   Cardiovascular:  Positive for leg swelling. Negative for chest pain and palpitations.  Gastrointestinal:  Positive for constipation. Negative for heartburn, nausea and vomiting.  Genitourinary:  Positive for urgency. Negative for dysuria and hematuria.  Musculoskeletal:  Positive for joint pain and myalgias.  Skin:  Negative for rash.  All other systems reviewed and are negative. Past Medical History:  Diagnosis Date   Diabetes mellitus without complication (HCC)    Hypertension    Past Surgical History:  Procedure Laterality Date   ABDOMINAL AORTOGRAM W/LOWER EXTREMITY N/A 05/12/2021   Procedure: ABDOMINAL AORTOGRAM W/LOWER EXTREMITY;  Surgeon: Nada Libman, MD;  Location: [facility];   Service: Cardiovascular;  Laterality: N/A;   AMPUTATION Left 05/13/2021   Procedure: AMPUTATION BELOW KNEE LEFT ;  Surgeon: Nada Libman, MD;  Location: [facility] OR;  Service: Vascular;  Laterality: Left;   HERNIA REPAIR     KNEE SURGERY     History reviewed. No pertinent family history. Social History:  reports that he has been smoking cigarettes. He has been smoking an average of 0.50 packs per day. He has never used smokeless tobacco. No history on file for alcohol use and drug use. Allergies:  Allergies  Allergen Reactions   Lisinopril Swelling    Swelling of lips   Medications Prior to Admission  Medication Sig Dispense Refill   amLODipine (NORVASC) 10 MG tablet Take 10 mg by mouth daily.     aspirin 325 MG EC tablet Take 325 mg by mouth daily.     atenolol (TENORMIN) 100 MG tablet Take 100 mg by mouth daily.     atorvastatin (LIPITOR) 20 MG tablet Take 20 mg by mouth at bedtime.     hydrochlorothiazide (HYDRODIURIL) 25 MG tablet Take 25 mg by mouth daily.     metFORMIN (GLUCOPHAGE) 500 MG tablet Take 500 mg by mouth daily with breakfast.     methocarbamol (ROBAXIN) 750 MG tablet Take 750 mg by mouth 3 (three) times daily.     oxybutynin (DITROPAN) 5 MG tablet Take 5 mg by mouth 2 (two) times daily.     vitamin B-12 (CYANOCOBALAMIN) 1000 MCG tablet Take 1,000 mcg by mouth daily.     oxyCODONE-acetaminophen (PERCOCET/ROXICET) 5-325 MG tablet Take 1 tablet by mouth every 4 (four) hours as needed for severe pain. 30 tablet 0  Drug Regimen Review Drug regimen was reviewed and remains appropriate with no significant issues identified  Home: Home Living Family/patient expects to be discharged to:: Private residence Living Arrangements: Spouse/significant other Available Help at Discharge: Family (spouse works but taking FMLA) Type of Home: House Home Access: Stairs to enter Secretary/administratorntrance Stairs-Number of Steps: 4 Entrance Stairs-Rails: Right, Left Home Layout: Multi-level Alternate  Level Stairs-Number of Steps: 4 steps to bedroom Bathroom Shower/Tub: Engineer, manufacturing systemsTub/shower unit Bathroom Toilet: Standard Bathroom Accessibility: Yes Home Equipment: None  Lives With: Spouse   Functional History: Prior Function Level of Independence: Independent  Functional Status:  Mobility: Bed Mobility Overal bed mobility: Needs Assistance Bed Mobility: Sit to Supine Supine to sit: Min guard, HOB elevated Sit to supine: Min guard General bed mobility comments: increased time, boost well enough, but needing upper and LE strengthening. Transfers Overall transfer level: Needs assistance Equipment used: Rolling walker (2 wheeled) Transfers: Sit to/from Stand, [employer]adarko Petroleum CorporationStand Pivot Transfers Sit to Stand: Mod assist, From elevated surface, Min assist Stand pivot transfers: Mod assist General transfer comment: Cued/demo'd transfer technique/safety and pivot/"swing to" pattern. Ambulation/Gait Ambulation/Gait assistance: Mod assist Gait Distance (Feet): 4 Feet (forward and back with the RW) Assistive device: Rolling walker (2 wheeled) Gait Pattern/deviations: Step-to pattern General Gait Details: moderate cuing to get pt understanding "swing to " on UE's, moderate stability assist.    ADL: ADL Overall ADL's : Needs assistance/impaired Eating/Feeding: Independent Grooming: Set up, Sitting Upper Body Bathing: Set up, Sitting Lower Body Bathing: Moderate assistance, Sitting/lateral leans, Bed level Upper Body Dressing : Set up, Sitting Lower Body Dressing: Moderate assistance, Sitting/lateral leans, Bed level Toilet Transfer: Moderate assistance, Stand-pivot, Cueing for safety, Cueing for sequencing, BSC, RW Toilet Transfer Details (indicate cue type and reason): to recliner, bed height elevated and mod A to power up to standing. patient able to pivot and take small hop on R foot however poor eccentric control into chair needing heavy mod A to safely sit into recliner Toileting- Clothing  Manipulation and Hygiene: Total assistance Functional mobility during ADLs: Moderate assistance, Cueing for safety, Cueing for sequencing, Rolling walker General ADL Comments: patient needing increased assistance with self care tasks due to new BKA impacting balance, pain level, activity tolerance, safety  Cognition: Cognition Overall Cognitive Status: Within Functional Limits for tasks assessed Orientation Level: Oriented X4 Cognition Arousal/Alertness: Awake/alert Behavior During Therapy: WFL for tasks assessed/performed Overall Cognitive Status: Within Functional Limits for tasks assessed  Physical Exam: Blood pressure (!) 147/84, pulse 94, temperature 98 F (36.7 C), temperature source Oral, resp. rate 14, height 5\' 11"  (1.803 m), weight 61.2 kg, SpO2 98 %. Physical Exam Constitutional:      General: He is not in acute distress.    Appearance: He is not ill-appearing.  HENT:     Head: Normocephalic.     Nose: Nose normal.     Mouth/Throat:     Mouth: Mucous membranes are moist.     Comments: Missing teeth Eyes:     Extraocular Movements: Extraocular movements intact.     Pupils: Pupils are equal, round, and reactive to light.  Cardiovascular:     Rate and Rhythm: Normal rate and regular rhythm.     Heart sounds: No murmur heard.   No gallop.  Pulmonary:     Effort: No respiratory distress.  Abdominal:     General: Abdomen is flat. There is no distension.     Palpations: Abdomen is soft.     Tenderness: There is no abdominal tenderness.  Musculoskeletal:  Cervical back: Normal range of motion.     Comments: Left residual limb tender, edematous, shrinker in place  Skin:    General: Skin is warm.     Comments: Left BKA site with limb guard in place.  Staples in place, incision well-approximated, skin dry  Neurological:     Mental Status: He is alert.     Comments: Patient is alert in no acute distress.  Oriented x3 and follows commands. UE 5/5. RLE 4/5 prox to  distal. Able to lift left leg off bed. No focal sensory findings.  Psychiatric:        Mood and Affect: Mood normal.        Behavior: Behavior normal.        Thought Content: Thought content normal.        Judgment: Judgment normal.    Results for orders placed or performed during the hospital encounter of 05/12/21 (from the past 48 hour(s))  Glucose, capillary     Status: Abnormal   Collection Time: 05/13/21  9:55 AM  Result Value Ref Range   Glucose-Capillary 111 (H) 70 - 99 mg/dL    Comment: Glucose reference range applies only to samples taken after fasting for at least 8 hours.  Type and screen [facility]     Status: None   Collection Time: 05/13/21  9:58 AM  Result Value Ref Range   ABO/RH(D) O POS    Antibody Screen NEG    Sample Expiration      05/16/2021,2359 Performed at [facility], [address], [place], [place] [address]   ABO/Rh     Status: None   Collection Time: 05/13/21 10:15 AM  Result Value Ref Range   ABO/RH(D)      O POS Performed at [facility], [address], [place], [place] [address]   Glucose, capillary     Status: Abnormal   Collection Time: 05/13/21 12:47 PM  Result Value Ref Range   Glucose-Capillary 117 (H) 70 - 99 mg/dL    Comment: Glucose reference range applies only to samples taken after fasting for at least 8 hours.  Glucose, capillary     Status: Abnormal   Collection Time: 05/13/21  1:46 PM  Result Value Ref Range   Glucose-Capillary 118 (H) 70 - 99 mg/dL    Comment: Glucose reference range applies only to samples taken after fasting for at least 8 hours.  Glucose, capillary     Status: Abnormal   Collection Time: 05/13/21  4:15 PM  Result Value Ref Range   Glucose-Capillary 256 (H) 70 - 99 mg/dL    Comment: Glucose reference range applies only to samples taken after fasting for at least 8 hours.  Glucose, capillary     Status: Abnormal   Collection Time: 05/13/21  8:19 PM  Result Value Ref Range    Glucose-Capillary 174 (H) 70 - 99 mg/dL    Comment: Glucose reference range applies only to samples taken after fasting for at least 8 hours.  Basic metabolic panel     Status: Abnormal   Collection Time: 05/14/21  1:07 AM  Result Value Ref Range   Sodium 130 (L) 135 - 145 mmol/L   Potassium 4.0 3.5 - 5.1 mmol/L   Chloride 96 (L) 98 - 111 mmol/L   CO2 27 22 - 32 mmol/L   Glucose, Bld 116 (H) 70 - 99 mg/dL    Comment: Glucose reference range applies only to samples taken after  fasting for at least 8 hours.   BUN 21 8 - 23 mg/dL   Creatinine, Ser 1.61 0.61 - 1.24 mg/dL   Calcium 9.6 8.9 - 09.6 mg/dL   GFR, Estimated >04 >54 mL/min    Comment: (NOTE) Calculated using the CKD-EPI Creatinine Equation (2021)    Anion gap 7 5 - 15    Comment: Performed at [facility], [address] [place], [place] [address]  CBC     Status: Abnormal   Collection Time: 05/14/21  1:07 AM  Result Value Ref Range   WBC 6.4 4.0 - 10.5 K/uL   RBC 2.50 (L) 4.22 - 5.81 MIL/uL   Hemoglobin 7.0 (L) 13.0 - 17.0 g/dL   HCT 91.4 (L) 78.2 - 95.6 %   MCV 90.0 80.0 - 100.0 fL   MCH 28.0 26.0 - 34.0 pg   MCHC 31.1 30.0 - 36.0 g/dL   RDW 21.3 08.6 - 57.8 %   Platelets 382 150 - 400 K/uL   nRBC 0.0 0.0 - 0.2 %    Comment: Performed at [facility], [address], [place], [place] [address]  Glucose, capillary     Status: Abnormal   Collection Time: 05/14/21  6:21 AM  Result Value Ref Range   Glucose-Capillary 124 (H) 70 - 99 mg/dL    Comment: Glucose reference range applies only to samples taken after fasting for at least 8 hours.   Comment 1 Document in Chart   Glucose, capillary     Status: Abnormal   Collection Time: 05/14/21 12:05 PM  Result Value Ref Range   Glucose-Capillary 122 (H) 70 - 99 mg/dL    Comment: Glucose reference range applies only to samples taken after fasting for at least 8 hours.  Comprehensive metabolic panel     Status: Abnormal   Collection Time: 05/14/21  4:12 PM   Result Value Ref Range   Sodium 131 (L) 135 - 145 mmol/L   Potassium 4.5 3.5 - 5.1 mmol/L   Chloride 96 (L) 98 - 111 mmol/L   CO2 25 22 - 32 mmol/L   Glucose, Bld 153 (H) 70 - 99 mg/dL    Comment: Glucose reference range applies only to samples taken after fasting for at least 8 hours.   BUN 17 8 - 23 mg/dL   Creatinine, Ser 9.52 0.61 - 1.24 mg/dL   Calcium 84.1 8.9 - 32.4 mg/dL   Total Protein 7.4 6.5 - 8.1 g/dL   Albumin 2.0 (L) 3.5 - 5.0 g/dL   AST 35 15 - 41 U/L   ALT 16 0 - 44 U/L   Alkaline Phosphatase 111 38 - 126 U/L   Total Bilirubin 0.4 0.3 - 1.2 mg/dL   GFR, Estimated >40 >10 mL/min    Comment: (NOTE) Calculated using the CKD-EPI Creatinine Equation (2021)    Anion gap 10 5 - 15    Comment: Performed at [facility], [address], [place], [place] [address]  Magnesium     Status: Abnormal   Collection Time: 05/14/21  4:12 PM  Result Value Ref Range   Magnesium 1.5 (L) 1.7 - 2.4 mg/dL    Comment: Performed at [facility], [address], [place], [place] [address]  Phosphorus     Status: None   Collection Time: 05/14/21  4:12 PM  Result Value Ref Range   Phosphorus 3.5 2.5 - 4.6 mg/dL    Comment: Performed at [facility], [address] [place], [place] [address]  Glucose, capillary     Status: Abnormal   Collection Time: 05/14/21  5:13 PM  Result Value Ref Range   Glucose-Capillary 127 (H) 70 - 99 mg/dL    Comment: Glucose reference range applies only to samples taken after fasting for at least 8 hours.  Glucose, capillary     Status: Abnormal   Collection Time: 05/14/21  9:03 PM  Result Value Ref Range   Glucose-Capillary 155 (H) 70 - 99 mg/dL    Comment: Glucose reference range applies only to samples taken after fasting for at least 8 hours.  Basic metabolic panel     Status: Abnormal   Collection Time: 05/15/21  1:47 AM  Result Value Ref Range   Sodium 128 (L) 135 - 145 mmol/L   Potassium 5.0 3.5 - 5.1 mmol/L   Chloride 92 (L) 98  - 111 mmol/L   CO2 27 22 - 32 mmol/L   Glucose, Bld 127 (H) 70 - 99 mg/dL    Comment: Glucose reference range applies only to samples taken after fasting for at least 8 hours.   BUN 14 8 - 23 mg/dL   Creatinine, Ser 9.56 0.61 - 1.24 mg/dL   Calcium 21.3 (H) 8.9 - 10.3 mg/dL   GFR, Estimated >08 >65 mL/min    Comment: (NOTE) Calculated using the CKD-EPI Creatinine Equation (2021)    Anion gap 9 5 - 15    Comment: Performed at [facility], [address], [place], [place] [address]  CBC     Status: Abnormal   Collection Time: 05/15/21  1:47 AM  Result Value Ref Range   WBC 8.3 4.0 - 10.5 K/uL   RBC 3.03 (L) 4.22 - 5.81 MIL/uL   Hemoglobin 8.5 (L) 13.0 - 17.0 g/dL   HCT 62.9 (L) 52.8 - 41.3 %   MCV 88.8 80.0 - 100.0 fL   MCH 28.1 26.0 - 34.0 pg   MCHC 31.6 30.0 - 36.0 g/dL   RDW 24.4 01.0 - 27.2 %   Platelets 462 (H) 150 - 400 K/uL   nRBC 0.0 0.0 - 0.2 %    Comment: Performed at [facility], [address], [place], [place] [address]  Glucose, capillary     Status: Abnormal   Collection Time: 05/15/21  6:24 AM  Result Value Ref Range   Glucose-Capillary 144 (H) 70 - 99 mg/dL    Comment: Glucose reference range applies only to samples taken after fasting for at least 8 hours.   No results found.     Medical Problem List and Plan: 1.  DebilityDebility secondary to left BKA 05/13/2021  -patient may shower if incision is covered  -ELOS/Goals: 7-10 days, supervision with PT and OT  2.  Antithrombotics: -DVT/anticoagulation: Subcutaneous heparin  -antiplatelet therapy: Aspirin 81 mg daily 3. Pain Management: Oxycodone as needed  -pt having phantom limb pain LLE. Will add low dose gabapentin tonight  qhs   -discussed residual limb massage and visual feedback 4. Mood: Provide emotional support  -antipsychotic agents: N/A 5. Neuropsych: This patient is capable of making decisions on own his behalf. 6. Skin/Wound Care: continue shrinker over limb for healing,  edema control  -limb guard for protection, promotion of knee extension 7. Fluids/Electrolytes/Nutrition: Routine in and outs with follow-up chemistries 8.  Acute blood loss anemia.  Follow-up CBC 9.  Hypertension.  Norvasc 10 mg daily, Tenormin 25 mg twice daily.  Monitor with increased mobility  -bp  controlled at present 10.  Diabetes mellitus.  Hemoglobin A1c 5.2.  SSI.  Patient on Glucophage 500 mg daily prior to admission.   -monitor CBG's and resume as needed 11.  Hyperlipidemia.  Lipitor 12.  Tobacco/alcohol use.  Provide counseling   Charlton Amor, PA-C 05/15/2021

## 2021-05-15 NOTE — Progress Notes (Addendum)
   VASCULAR SURGERY ASSESSMENT & PLAN:   2 Days Post-Op  PAD: s/p left BKA. Incision intact. Flaps well perfused. VSS. Afebrile. Right iliofemoral stenosis: Will need right femoral endarterectomy and iliac stent once he has recovered. Continue aspirin and statin  Acute BLA: stable hemodynamics. Hgb 8.5 today  Alcohol use: No signs or symptoms of withdrawal: continue to monitor.  Hypertension: continue home meds  DM: CBGs ok. Continue current regimen and diet.  DVT prophy: Heparin [place]  Dispo: possible CIR placement/ TOC consult  SUBJECTIVE:   Pain controlled with oral meds  PHYSICAL EXAM:   Vitals:   05/14/21 2315 05/15/21 0002 05/15/21 0448 05/15/21 0842  BP: (!) 147/84   134/85  Pulse: 96 94  94  Resp: 14   14  Temp: 99.4 F (37.4 C)  98 F (36.7 C) 98.4 F (36.9 C)  TempSrc: Oral  Oral Oral  SpO2: 97%  98% 99%  Weight:      Height:       General appearance: Awake, alert in no apparent distress Cardiac: Heart rate and rhythm are regular Respirations: Nonlabored Extremities: Amputation site incision is well approximated without bleeding or hematoma.  Anterior and posterior flaps are warm and well-perfused. 2+ left femoral pulse without hematoma. Right foot warm with intact sensation and motor function. No skin breakdown.  LABS:   Lab Results  Component Value Date   WBC 8.3 05/15/2021   HGB 8.5 (L) 05/15/2021   HCT 26.9 (L) 05/15/2021   MCV 88.8 05/15/2021   PLT 462 (H) 05/15/2021   Lab Results  Component Value Date   CREATININE 0.82 05/15/2021   No results found for: INR, PROTIME CBG (last 3)  Recent Labs    05/14/21 1713 05/14/21 2103 05/15/21 0624  GLUCAP 127* 155* 144*    PROBLEM LIST:    Active Problems:   PAD (peripheral artery disease) (HCC)   CURRENT MEDS:    amLODipine  10 mg Oral Daily   aspirin EC  81 mg Oral Daily   atenolol  25 mg Oral BID   atorvastatin  20 mg Oral QHS   docusate sodium  100 mg Oral Daily   folic acid  1  mg Oral Daily   heparin  5,000 Units Subcutaneous Q8H   hydrochlorothiazide  25 mg Oral Daily   insulin aspart  0-9 Units Subcutaneous TID WC   multivitamin with minerals  1 tablet Oral Daily   oxybutynin  2.5 mg Oral BID   pantoprazole  40 mg Oral Daily   senna-docusate  1 tablet Oral BID   sodium chloride flush  3 mL Intravenous Q12H   thiamine  100 mg Oral Daily   Or   thiamine  100 mg Intravenous Daily   cyanocobalamin  1,000 mcg Oral Daily    Milinda Antis, [place]  Office: [phone] 05/15/2021

## 2021-05-15 NOTE — Progress Notes (Signed)
Staff heard bed alarm and on arrival, patient found on the floor. Complained of L hip pain 5/10; denies hitting head. Patient placed in bed. Per patient , he heard his cell phone rung and when he tried to reach it, the table rolled away and he slid on the floor. Patient reminded to use call bell for any need/assistance.

## 2021-05-15 NOTE — Progress Notes (Signed)
Physical Therapy Treatment Patient Details Name: Todd Duran MRN: [id number] DOB: [birth year] Today's Date: 05/15/2021    History of Present Illness Patient is a 75 year old male s/p L BKA for non salvageable L foot.   PMH includes DM, HTN    PT Comments    Pt progressing slowly toward goals, lethargic, but participative with redirection.  Emphasis on L LE amputee exercises, bridging toward EOB , transition, sitting balance and then transfer to the chair with face to face assist in lieu of RW due to lethargy.    Follow Up Recommendations  CIR;Supervision/Assistance - 24 hour     Equipment Recommendations  Other (comment);Rolling walker with 5" wheels;3in1 (PT);Wheelchair (measurements PT);Wheelchair cushion (measurements PT)    Recommendations for Other Services Rehab consult     Precautions / Restrictions Precautions Precautions: Fall Restrictions LLE Weight Bearing: Non weight bearing    Mobility  Bed Mobility Overal bed mobility: Needs Assistance Bed Mobility: Supine to Sit     Supine to sit: Min assist;Mod assist     General bed mobility comments: Repetitve cues for direction due to lethargic.  Worked on bridging to EOB, then  transition up via R elbow to EOB.    Transfers Overall transfer level: Needs assistance   Transfers: Sit to/from Stand;Stand Pivot Transfers Sit to Stand: Mod assist Stand pivot transfers: Mod assist       General transfer comment: face to face assist in lieu of the RW due to pt's level of arousal/lethargic from meds.  Ambulation/Gait                 Stairs             Wheelchair Mobility    Modified Rankin (Stroke Patients Only)       Balance     Sitting balance-Leahy Scale: Fair     Standing balance support: Single extremity supported;Bilateral upper extremity supported;During functional activity Standing balance-Leahy Scale: Poor Standing balance comment: reliant on UE support and mod A                             Cognition Arousal/Alertness: Awake/alert;Lethargic Behavior During Therapy: WFL for tasks assessed/performed Overall Cognitive Status: Difficult to assess                                        Exercises Amputee Exercises Quad Sets: AROM;10 reps;Supine;Left Hip Extension: AROM;AAROM;Left;10 reps;Supine Hip Flexion/Marching: AROM;AAROM;Strengthening;Left;10 reps Knee Flexion: AROM;Strengthening;Left;10 reps;Supine Knee Extension: AROM;Strengthening;Left;10 reps;Supine    General Comments        Pertinent Vitals/Pain Pain Assessment: Faces Faces Pain Scale: Hurts even more Pain Location: L residual limb Pain Descriptors / Indicators: Throbbing;Operative site guarding Pain Intervention(s): Monitored during session;Limited activity within patient's tolerance    Home Living                      Prior Function            PT Goals (current goals can now be found in the care plan section) Acute Rehab PT Goals PT Goal Formulation: With patient Time For Goal Achievement: 05/28/21 Potential to Achieve Goals: Good Progress towards PT goals: Progressing toward goals    Frequency    Min 4X/week      PT Plan Current plan remains appropriate    Co-evaluation  AM-PAC PT "6 Clicks" Mobility   Outcome Measure  Help needed turning from your back to your side while in a flat bed without using bedrails?: A Little Help needed moving from lying on your back to sitting on the side of a flat bed without using bedrails?: A Little Help needed moving to and from a bed to a chair (including a wheelchair)?: A Lot Help needed standing up from a chair using your arms (e.g., wheelchair or bedside chair)?: A Lot   Help needed climbing 3-5 steps with a railing? : Total 6 Click Score: 11    End of Session   Activity Tolerance: Patient tolerated treatment well;Patient limited by lethargy Patient left: in chair;with call  bell/phone within reach;with family/visitor present;with chair alarm set Nurse Communication: Mobility status PT Visit Diagnosis: Other abnormalities of gait and mobility (R26.89);Muscle weakness (generalized) (M62.81);Difficulty in walking, not elsewhere classified (R26.2)     Time: 9371-6967 PT Time Calculation (min) (ACUTE ONLY): 19 min  Charges:  $Therapeutic Activity: 8-22 mins                     05/15/2021  Jacinto Halim., PT Acute Rehabilitation Services [phone]  (pager) [phone]  (office)   Eliseo Gum Ebonie Westerlund 05/15/2021, 12:17 PM

## 2021-05-15 NOTE — Progress Notes (Signed)
Pt transferred to [address] [place].  Pt taken off telemetry and CCMD notified.  Pt's IV's removed.  Pt left with all of their personal belongings. Report given to 4W RN.

## 2021-05-16 DIAGNOSIS — Z89512 Acquired absence of left leg below knee: Secondary | ICD-10-CM | POA: Diagnosis not present

## 2021-05-16 LAB — GLUCOSE, CAPILLARY
Glucose-Capillary: 104 mg/dL — ABNORMAL HIGH (ref 70–99)
Glucose-Capillary: 86 mg/dL (ref 70–99)
Glucose-Capillary: 97 mg/dL (ref 70–99)
Glucose-Capillary: 117 mg/dL — ABNORMAL HIGH (ref 70–99)

## 2021-05-16 LAB — URINALYSIS, ROUTINE W REFLEX MICROSCOPIC
Bilirubin Urine: NEGATIVE
Glucose, UA: NEGATIVE mg/dL
Hgb urine dipstick: NEGATIVE
Ketones, ur: NEGATIVE mg/dL
Nitrite: NEGATIVE
Protein, ur: 30 mg/dL — AB
Specific Gravity, Urine: 1.014 (ref 1.005–1.030)
WBC, UA: >50 WBC/hpf — ABNORMAL HIGH (ref 0–5)
pH: 5 (ref 5.0–8.0)

## 2021-05-16 MED ORDER — CEPHALEXIN 500 MG PO CAPS
500.0000 mg | ORAL_CAPSULE | Freq: Four times a day (QID) | ORAL | Status: DC
  Filled 2021-05-16: qty 1

## 2021-05-16 MED ORDER — OXYCODONE HCL 5 MG PO TABS
5.0000 mg | ORAL_TABLET | ORAL | Status: DC | PRN
  Administered 2021-05-16 – 2021-05-17 (×2): 5 mg via ORAL
  Filled 2021-05-16 (×2): qty 1

## 2021-05-16 MED ORDER — SODIUM CHLORIDE 0.9 % IV SOLN
1.0000 g | INTRAVENOUS | Status: DC
  Administered 2021-05-16 – 2021-05-17 (×2): 1 g via INTRAVENOUS
  Filled 2021-05-16: qty 1
  Filled 2021-05-16: qty 10
  Filled 2021-05-16: qty 1

## 2021-05-16 MED ORDER — MAGNESIUM SULFATE 4 GM/100ML IV SOLN
4.0000 g | Freq: Once | INTRAVENOUS | Status: AC
  Administered 2021-05-16: 4 g via INTRAVENOUS
  Filled 2021-05-16: qty 100

## 2021-05-16 NOTE — Evaluation (Signed)
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[place]) Active Problems:   PAD (peripheral artery disease) ([place])   Past Medical History:  Past Medical History:  Diagnosis Date   Diabetes mellitus without complication ([place])    Hypertension    Past Surgical History:  Past Surgical History:  Procedure Laterality Date   ABDOMINAL AORTOGRAM W/LOWER EXTREMITY N/A 05/12/2021   Procedure: ABDOMINAL AORTOGRAM W/LOWER EXTREMITY;  Surgeon: Serafina Mitchell, MD;  Location: [facility];  Service: Cardiovascular;  Laterality: N/A;   AMPUTATION Left 05/13/2021   Procedure: AMPUTATION BELOW KNEE LEFT ;  Surgeon: Serafina Mitchell, MD;  Location: [facility];  Service: Vascular;  Laterality: Left;   HERNIA REPAIR     KNEE SURGERY      Assessment & Plan Clinical Impression:  Todd Duran. Todd Duran is a 75 year old right-handed male with history of diabetes well is hypertension and tobacco/alcohol abuse.  Per chart review patient lives with spouse.  Independent prior to admission.  Multilevel home 4 steps to entry.  Spouse works during the day.  Presented 05/13/2021 with progressive left foot pain x2 months with ischemic changes and findings of decreased femoral pulses bilaterally.  Underwent ultrasound-guided left femoral artery with abdominal aortogram showing bilateral renal artery stenosis.  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  Limb was not felt to be salvageable.  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 Trula Slade.  Hospital course anemia 8.5 as well as mild hyponatremia 130-131.  He was cleared to begin subcutaneous  heparin for DVT prophylaxis. Patient transferred to CIR on 05/15/2021 .    Patient currently requires max with basic self-care skills secondary to muscle weakness, decreased cardiorespiratoy endurance, and decreased standing balance, decreased postural control, and decreased balance strategies.  Prior to hospitalization, patient could complete BADLs with independent .  Patient will benefit from skilled intervention to increase independence with basic self-care skills prior to discharge home with care partner.  Anticipate patient will require intermittent supervision and  follow up to be determined .  OT - End of Session Activity Tolerance: Decreased this session OT Assessment Rehab Potential (ACUTE ONLY): Good OT Patient demonstrates impairments in the following area(s): Balance;Safety;Sensory;Cognition;Edema;Endurance;Motor;Pain;Perception OT Basic ADL's Functional Problem(s): Grooming;Bathing;Dressing;Toileting OT Transfers Functional Problem(s): Toilet;Tub/Shower OT Additional Impairment(s): None OT Plan OT Intensity: Minimum of 1-2 x/day, 45 to 90 minutes OT Frequency: 5 out of 7 days OT Duration/Estimated Length of Stay: 10-12 days OT Treatment/Interventions: Balance/vestibular training;Self Care/advanced ADL retraining;Therapeutic Exercise;Wheelchair propulsion/positioning;Disease mangement/prevention;Cognitive remediation/compensation;DME/adaptive equipment instruction;Pain management;Skin care/wound managment;UE/LE Strength taining/ROM;Community reintegration;Functional electrical stimulation;Patient/family education;Splinting/orthotics;UE/LE Coordination activities;Discharge planning;Functional mobility training;Psychosocial support;Visual/perceptual remediation/compensation;Therapeutic Activities OT Self Feeding Anticipated Outcome(s): N/A OT Basic Self-Care Anticipated Outcome(s): Supervision OT Toileting Anticipated Outcome(s): Supervision OT Bathroom Transfers Anticipated Outcome(s):  Supervision OT Recommendation Patient destination: Home Follow Up Recommendations: Other (comment) (TBD) Equipment Recommended: To be determined   OT Evaluation Precautions/Restrictions  Precautions Precautions: Fall Precaution Comments: L- BKA Restrictions LLE Weight Bearing: Non weight bearing General Chart Reviewed: Yes Vital Signs Therapy Vitals Temp: 98.5 F (36.9 C) Temp Source: Oral Pulse Rate: 88 Resp: 14 BP: 139/85 Patient Position (if appropriate): Lying Oxygen Therapy SpO2: 99 % O2 Device:  Room Air Pain Pain Assessment Pain Scale: 0-10 Pain Score: Asleep Pain Type: Acute pain;Surgical pain Pain Location: Leg Pain Orientation: Left Pain Radiating Towards: low back Pain Descriptors / Indicators: Sharp Pain Frequency: Intermittent Pain Onset: On-going Pain Intervention(s): Medication (See eMAR) Home Living/Prior [place] expects to be discharged to:: Private residence Living Arrangements: Spouse/significant other Available Help at Discharge: Available PRN/intermittently Type of Home: House Home Access: Stairs to enter Technical brewer of Steps: 4 Home Layout: Multi-level Alternate Level Stairs-Number of Steps: 4 steps to bed per chart Bathroom Shower/Tub: Chiropodist: Standard  Lives With: Spouse Prior Function Level of Independence: Independent with basic ADLs Vision Baseline Vision/History: Wears glasses Wears Glasses: At all times Patient Visual Report: No change from baseline Vision Assessment?: No apparent visual deficits Perception    Praxis   Cognition Overall Cognitive Status: Impaired/Different from baseline (RN reported pt took 10 mg oxy ~2 hours prior to session and pt significantly fatigued at time of eval) Arousal/Alertness: Lethargic Orientation Level: Person;Place;Situation Person: Oriented Place: Oriented Situation: Oriented Year: 2022 Month: July Day of Week:  Incorrect Memory: Appears intact Immediate Memory Recall: Sock;Blue;Bed Memory Recall Sock: With Cue Memory Recall Blue: With Cue Memory Recall Bed: With Cue Attention: Sustained Sustained Attention: Appears intact Sensation Sensation Light Touch: Not tested Additional Comments: Not tested due to significant fatigue Coordination Gross Motor Movements are Fluid and Coordinated:  (Unable to formally assess due to significant fatigue.) Fine Motor Movements are Fluid and Coordinated: Yes Motor  Motor Motor - Skilled Clinical Observations: Unable to formally assess due to signficant fatigue.  Trunk/Postural Assessment     Balance Balance Balance Assessed: Yes Static Sitting Balance Static Sitting - Balance Support: Feet supported;Bilateral upper extremity supported Static Sitting - Level of Assistance: 7: Independent Dynamic Sitting Balance Dynamic Sitting - Balance Support: Feet supported;During functional activity Dynamic Sitting - Level of Assistance: 6: Modified independent (Device/Increase time) Extremity/Trunk Assessment RUE Assessment RUE Assessment: Within Functional Limits General Strength Comments: 5/5 LUE Assessment LUE Assessment: Within Functional Limits General Strength Comments: 5/5  Care Tool Care Tool Self Care Eating   Eating Assist Level: Set up assist    Oral Care         Bathing   Body parts bathed by patient: Left arm;Chest;Abdomen;Face Body parts bathed by helper: Right arm;Right lower leg;Left upper leg Body parts n/a: Left lower leg Assist Level: Moderate Assistance - Patient 50 - 74%    Upper Body Dressing(including orthotics)   What is the patient wearing?: Pull over shirt   Assist Level: Minimal Assistance - Patient > 75%    Lower Body Dressing (excluding footwear)   What is the patient wearing?: Pants Assist for lower body dressing: Total Assistance - Patient < 25%    Putting on/Taking off footwear   What is the patient wearing?:  [place] for footwear: Total Assistance - Patient < 25%       Care Tool Toileting Toileting activity Toileting Activity did not occur (Clothing management and hygiene only): N/A (no void or bm)       Care Tool Bed Mobility Roll left and right activity        Sit to lying activity   Sit to lying assist level: Supervision/Verbal cueing    Lying to sitting edge of bed activity   Lying to sitting edge of bed assist level: Moderate Assistance - Patient 50 - 74%     Care Tool Transfers Sit to stand transfer  Chair/bed transfer         Toilet transfer         Care Tool Cognition Expression of Ideas and Wants Expression of Ideas and Wants: Some difficulty - exhibits some difficulty with expressing needs and ideas (e.g, some words or finishing thoughts) or speech is not clear   Understanding Verbal and Non-Verbal Content Understanding Verbal and Non-Verbal Content: Sometimes understands - understands only basic conversations or simple, direct phrases. Frequently requires cues to understand   Memory/Recall Ability *first 3 days only Memory/Recall Ability *first 3 days only: Current season;That he or she is in a hospital/hospital unit    Refer to Care Plan for [place] 1 OT Short Term Goal 1 (Week 1): Pt will complete LB dressing with min A and use of AE, as needed. OT Short Term Goal 2 (Week 1): Pt will complete toileting task with min A. OT Short Term Goal 3 (Week 1): Pt will complete functional transfers (toilet, shower) with min A. OT Short Term Goal 4 (Week 1): To promote UB strength and functional endurance of ADLs, pt will engage in UB strengthening x5 min without rest break.  Recommendations for other services: None    Skilled Therapeutic Intervention OT evaluation significantly limited due to pt fatigue. OT spoke with RN prior to session d/t fall last night. RN notified OT that pt was no longer c/o hip pain and was now informing  of back pain, which was present PTA. RN informed that pt c/o 10/10 pain at LLE this morning, therefore she provided 10 mg oxy 1-2 hours prior to session. Pt significantly fatigued by medication and appears to be confused. Vitals taken and BP 147/83 and HR 91, RN notified. With increased time and OT providing total assist on first attempt, pt sat EOB x 4 min. Pt inconsistently following simple commands with max difficulty keeping eyes open. Pt transferred back to supine without warning or awareness of positioning in bed. OT able to transition pt back to sitting EOB with mod A, completing quick sponge bath and donning paper scrubs. Pt required max prompting and OT to initiate all self-care tasks. Pt unable to remain awake to stand and manage pants around waist, therefore completed in supine. Pt left with all needs in reach and bed alarm donned. OT notified RN and NT of concerns with lethargy and confusion this AM.   ADL   Mobility  Bed Mobility Bed Mobility: Supine to Sit Supine to Sit: Maximal Assistance - Patient - Patient 25-49%;Minimal Assistance - Patient > 75%   Discharge Criteria: Patient will be discharged from OT if patient refuses treatment 3 consecutive times without medical reason, if treatment goals not met, if there is a change in medical status, if patient makes no progress towards goals or if patient is discharged from hospital.  The above assessment, treatment plan, treatment alternatives and goals were discussed and mutually agreed upon: by patient  Duayne Cal 05/16/2021, 8:08 AM

## 2021-05-16 NOTE — Progress Notes (Signed)
Occupational Therapy Note  Patient Details  Name: CEJAY CAMBRE MRN: [id number] Date of Birth: 10-22-1946  Today's Date: 05/16/2021 OT Individual Time: 1400-1410 OT Individual Time Calculation (min): 10 min  and Today's Date: 05/16/2021 OT Missed Time: 60 Minutes Missed Time Reason: Patient unwilling/refused to participate without medical reason  Pt received in bed with family, MD and RN present at bedside. Pt is becoming increasingly agitated with questions and refused any out of bed or in bed activity. Missing 60 minutes of skilled OT. Per MD, pt has in and out confusion, has ordered UA to rule out UTI and adjusted medications.   Eisley Barber 05/16/2021, 2:17 PM

## 2021-05-16 NOTE — Progress Notes (Signed)
PROGRESS NOTE   Subjective/Complaints: Patient fell last night. Hit hip and complained of pain afterwards. XR of hip obtained and showed degenerative changes- no acute abnormalities  ROS: +residual limb pain at times, well controlled with medication   Objective:   DG HIP UNILAT WITH PELVIS 2-3 VIEWS LEFT  Result Date: 05/15/2021 CLINICAL DATA:  Recent fall with left hip pain, initial encounter EXAM: DG HIP (WITH OR WITHOUT PELVIS) 3V LEFT COMPARISON:  MRI from 04/13/2021 FINDINGS: Proximal femur appears within normal limits. Mild degenerative changes of the hip joint are seen. Pelvic ring as visualized is intact. No gross soft tissue abnormality is seen. Diffuse vascular calcifications are noted. IMPRESSION: Degenerative changes without acute abnormality. Electronically Signed   By: Alcide Clever M.D.   On: 05/15/2021 21:24   Recent Labs    05/14/21 0107 05/15/21 0147  WBC 6.4 8.3  HGB 7.0* 8.5*  HCT 22.5* 26.9*  PLT 382 462*   Recent Labs    05/14/21 1612 05/15/21 0147  NA 131* 128*  K 4.5 5.0  CL 96* 92*  CO2 25 27  GLUCOSE 153* 127*  BUN 17 14  CREATININE 0.90 0.82  CALCIUM 10.0 10.4*    Intake/Output Summary (Last 24 hours) at 05/16/2021 1701 Last data filed at 05/16/2021 1545 Gross per 24 hour  Intake 120 ml  Output 701 ml  Net -581 ml        Physical Exam: Vital Signs Blood pressure 128/72, pulse 89, temperature 99.9 F (37.7 C), resp. rate 17, height 5\' 11"  (1.803 m), weight 55.7 kg, SpO2 98 %. Gen: no distress, normal appearing HEENT: oral mucosa pink and moist, NCAT Cardio: Reg rate Chest: normal effort, normal rate of breathing Abd: soft, non-distended Ext: no edema Psych: pleasant, normal affect Musculoskeletal:    Cervical back: Normal range of motion.    Comments: Left residual limb tender, edematous, shrinker in place  Skin:    General: Skin is warm.    Comments: Left BKA site with limb  guard in place.  Staples in place, incision well-approximated, skin dry  Neurological:    Mental Status: He is alert.    Comments: Patient is alert in no acute distress.  Oriented x3 and follows commands. UE 5/5. RLE 4/5 prox to distal. Able to lift left leg off bed. No focal sensory findings.  Psychiatric:        Mood and Affect: Mood normal.        Behavior: Behavior normal.        Thought Content: Thought content normal.        Judgment: Judgment normal.   Assessment/Plan: 1. Functional deficits which require 3+ hours per day of interdisciplinary therapy in a comprehensive inpatient rehab setting. Physiatrist is providing close team supervision and 24 hour management of active medical problems listed below. Physiatrist and rehab team continue to assess barriers to discharge/monitor patient progress toward functional and medical goals  Care Tool:  Bathing    Body parts bathed by patient: Left arm, Chest, Abdomen, Face   Body parts bathed by helper: Right arm, Right lower leg, Left upper leg Body parts n/a: Left lower leg   Bathing assist Assist  Level: Moderate Assistance - Patient 50 - 74%     Upper Body Dressing/Undressing Upper body dressing   What is the patient wearing?: Pull over shirt    Upper body assist Assist Level: Minimal Assistance - Patient > 75%    Lower Body Dressing/Undressing Lower body dressing      What is the patient wearing?: Pants     Lower body assist Assist for lower body dressing: Total Assistance - Patient < 25%     Toileting Toileting Toileting Activity did not occur (Clothing management and hygiene only): N/A (no void or bm)  Toileting assist Assist for toileting: Total Assistance - Patient < 25%     Transfers Chair/bed transfer  Transfers assist     Chair/bed transfer assist level: Moderate Assistance - Patient 50 - 74%     Locomotion Ambulation   Ambulation assist   Ambulation activity did not occur: Safety/medical  concerns          Walk 10 feet activity   Assist           Walk 50 feet activity   Assist           Walk 150 feet activity   Assist           Walk 10 feet on uneven surface  activity   Assist           Wheelchair     Assist               Wheelchair 50 feet with 2 turns activity    Assist            Wheelchair 150 feet activity     Assist          Blood pressure 128/72, pulse 89, temperature 99.9 F (37.7 C), resp. rate 17, height 5\' 11"  (1.803 m), weight 55.7 kg, SpO2 98 %.  Medical Problem List and Plan: 1. Debility secondary to left BKA 05/13/2021             -patient may shower if incision is covered             -ELOS/Goals: 7-10 days, supervision with PT and OT   Refusing CIR 2.  Antithrombotics: -DVT/anticoagulation: Subcutaneous heparin             -antiplatelet therapy: Aspirin 81 mg daily 3. Pain Management: Oxycodone as needed             -pt having phantom limb pain LLE. Will add low dose gabapentin tonight 100mg  qhs                         -discussed residual limb massage and visual feedback 4. Mood: Provide emotional support             -antipsychotic agents: N/A 5. Neuropsych: This patient is capable of making decisions on own his behalf. 6. Skin/Wound Care: continue shrinker over limb for healing, edema control             -limb guard for protection, promotion of knee extension 7. Fluids/Electrolytes/Nutrition: Routine in and outs with follow-up chemistries 8.  Acute blood loss anemia.  Follow-up CBC 9.  Hypertension.  Norvasc 10 mg daily, Tenormin 25 mg twice daily.  Monitor with increased mobility             -bp controlled at present 10.  Diabetes mellitus.  Hemoglobin A1c 5.2.  SSI.  Patient on Glucophage 500 mg daily prior  to admission.   -monitor CBG's and resume as needed 11.  Hyperlipidemia.  Lipitor 12.  Tobacco/alcohol use.  Provide counseling 13. Hypomagnesemia: 1.5 on 7/7. supplement 4gm  IV mag today, repeat level tomorrow 14. Hyponatremia: 131 to 128, repeat tomorrow 15. UTI: start keflex, f/u UC    LOS: 1 days A FACE TO FACE EVALUATION WAS PERFORMED  Todd Duran Todd Duran 05/16/2021, 5:01 PM

## 2021-05-16 NOTE — Progress Notes (Addendum)
In and out confusion; T99.9; refuses to eat and drinks just little water gets irritated when asks to drink more; Last urine at 5am bladder scan 167cc. MD notified . New orders noted.

## 2021-05-16 NOTE — Progress Notes (Signed)
Physical Therapy Note  Patient Details  Name: Todd Duran MRN: [id number] Date of Birth: Apr 25, 1946 Today's Date: 05/16/2021    Attempted to see pt x 2 today for intial evaluation.  Pt refused all attempts at assessment bedside or otherwise, refused assist oob to wc, bed therex, all suggestions stating "not today Im not", "not today".  Rada Hay, PT   Shearon Balo 05/16/2021, 3:36 PM

## 2021-05-16 NOTE — Progress Notes (Signed)
PHARMACY NOTE:  ANTIMICROBIAL RENAL DOSAGE ADJUSTMENT  Current antimicrobial regimen includes a mismatch between antimicrobial dosage and estimated renal function.  As per policy approved by the Pharmacy & Therapeutics and Medical Executive Committees, the antimicrobial dosage will be adjusted accordingly.  Current antimicrobial dosage:  cephalexin 500 Q12hr   Indication: UTI  Renal Function:  Estimated Creatinine Clearance: 63.2 mL/min (by C-G formula based on SCr of 0.82 mg/dL).     Antimicrobial dosage has been changed to:  q6 hr      Thank you for allowing pharmacy to be a part of this patient's care.  Alphia Moh, PharmD, BCPS, BCCP Clinical Pharmacist  Please check AMION for all [facility] phone numbers After 10:00 PM, call Main Pharmacy [phone]

## 2021-05-17 DIAGNOSIS — Z89512 Acquired absence of left leg below knee: Secondary | ICD-10-CM | POA: Diagnosis not present

## 2021-05-17 LAB — BASIC METABOLIC PANEL
Anion gap: 12 (ref 5–15)
BUN: 22 mg/dL (ref 8–23)
CO2: 24 mmol/L (ref 22–32)
Calcium: 9.8 mg/dL (ref 8.9–10.3)
Chloride: 92 mmol/L — ABNORMAL LOW (ref 98–111)
Creatinine, Ser: 1.03 mg/dL (ref 0.61–1.24)
GFR, Estimated: >60 mL/min (ref >60)
Glucose, Bld: 109 mg/dL — ABNORMAL HIGH (ref 70–99)
Potassium: 4.1 mmol/L (ref 3.5–5.1)
Sodium: 128 mmol/L — ABNORMAL LOW (ref 135–145)

## 2021-05-17 LAB — GLUCOSE, CAPILLARY
Glucose-Capillary: 117 mg/dL — ABNORMAL HIGH (ref 70–99)
Glucose-Capillary: 151 mg/dL — ABNORMAL HIGH (ref 70–99)
Glucose-Capillary: 180 mg/dL — ABNORMAL HIGH (ref 70–99)
Glucose-Capillary: 125 mg/dL — ABNORMAL HIGH (ref 70–99)

## 2021-05-17 LAB — MAGNESIUM: Magnesium: 2.2 mg/dL (ref 1.7–2.4)

## 2021-05-17 MED ORDER — LIDOCAINE HCL URETHRAL/MUCOSAL 2 % EX GEL
1.0000 "application " | CUTANEOUS | Status: DC | PRN
  Administered 2021-05-18 – 2021-05-20 (×3): 1 via TOPICAL
  Filled 2021-05-17 (×2): qty 6
  Filled 2021-05-17 (×3): qty 11
  Filled 2021-05-17 (×2): qty 6
  Filled 2021-05-17: qty 11
  Filled 2021-05-17: qty 6

## 2021-05-17 MED ORDER — MIRTAZAPINE 15 MG PO TABS
7.5000 mg | ORAL_TABLET | Freq: Every day | ORAL | Status: DC
  Administered 2021-05-17 – 2021-05-19 (×3): 7.5 mg via ORAL
  Filled 2021-05-17 (×3): qty 1

## 2021-05-17 MED ORDER — LIDOCAINE HCL URETHRAL/MUCOSAL 2 % EX GEL: 1.0000 "application " | Freq: Once | CUTANEOUS | Status: DC

## 2021-05-17 MED ORDER — ACETAMINOPHEN 325 MG PO TABS
650.0000 mg | ORAL_TABLET | Freq: Three times a day (TID) | ORAL | Status: DC
  Administered 2021-05-17 – 2021-05-20 (×8): 650 mg via ORAL
  Filled 2021-05-17 (×9): qty 2

## 2021-05-17 NOTE — Progress Notes (Signed)
Patient slept fairly well. Awakening in brief intervals throughout the night. Restless at times. No attempts to get out of bed unassisted. Very resistive to care, agitated and physically aggressive with personal care. Otherwise redirectable. Medicated x2 for pain to surgical site on left leg-partial effects noted.

## 2021-05-17 NOTE — Progress Notes (Signed)
PROGRESS NOTE   Subjective/Complaints: Na continues to be 128, repeat tomorrow More alert this morning D/ced oxycodone as this could have contributed to confusion. Scheduled tylenol  ROS: +residual limb pain at times, well controlled with medication, +restless night as per nursing staff   Objective:   DG HIP UNILAT WITH PELVIS 2-3 VIEWS LEFT  Result Date: 05/15/2021 CLINICAL DATA:  Recent fall with left hip pain, initial encounter EXAM: DG HIP (WITH OR WITHOUT PELVIS) 3V LEFT COMPARISON:  MRI from 04/13/2021 FINDINGS: Proximal femur appears within normal limits. Mild degenerative changes of the hip joint are seen. Pelvic ring as visualized is intact. No gross soft tissue abnormality is seen. Diffuse vascular calcifications are noted. IMPRESSION: Degenerative changes without acute abnormality. Electronically Signed   By: Alcide Clever M.D.   On: 05/15/2021 21:24   Recent Labs    05/15/21 0147  WBC 8.3  HGB 8.5*  HCT 26.9*  PLT 462*   Recent Labs    05/15/21 0147 05/17/21 0506  NA 128* 128*  K 5.0 4.1  CL 92* 92*  CO2 27 24  GLUCOSE 127* 109*  BUN 14 22  CREATININE 0.82 1.03  CALCIUM 10.4* 9.8    Intake/Output Summary (Last 24 hours) at 05/17/2021 1524 Last data filed at 05/17/2021 1336 Gross per 24 hour  Intake 0 ml  Output 500 ml  Net -500 ml        Physical Exam: Vital Signs Blood pressure 124/74, pulse (!) 55, temperature (!) 97.2 F (36.2 C), resp. rate 14, height 5\' 11"  (1.803 m), weight 55.7 kg, SpO2 (!) 77 %. Gen: no distress, somnolent HEENT: oral mucosa pink and moist, NCAT Cardio: bradycardic Chest: normal effort, normal rate of breathing Abd: soft, non-distended Ext: no edema Psych: somnolent, flat Musculoskeletal:    Cervical back: Normal range of motion.    Comments: Left residual limb tender, edematous, shrinker in place  Skin:    General: Skin is warm.    Comments: Left BKA site with  limb guard in place.  Staples in place, incision well-approximated, skin dry  Neurological:    Mental Status: He is alert.    Comments: Patient is alert in no acute distress.  Oriented x3 and follows commands. UE 5/5. RLE 4/5 prox to distal. Able to lift left leg off bed. No focal sensory findings.  Psychiatric:        Mood and Affect: Mood normal.        Behavior: Behavior normal.        Thought Content: Thought content normal.        Judgment: Judgment normal.   Assessment/Plan: 1. Functional deficits which require 3+ hours per day of interdisciplinary therapy in a comprehensive inpatient rehab setting. Physiatrist is providing close team supervision and 24 hour management of active medical problems listed below. Physiatrist and rehab team continue to assess barriers to discharge/monitor patient progress toward functional and medical goals  Care Tool:  Bathing    Body parts bathed by patient: Left arm, Chest, Abdomen, Face   Body parts bathed by helper: Right arm, Right lower leg, Left upper leg Body parts n/a: Left lower leg   Bathing assist Assist  Level: Moderate Assistance - Patient 50 - 74%     Upper Body Dressing/Undressing Upper body dressing   What is the patient wearing?: Pull over shirt    Upper body assist Assist Level: Minimal Assistance - Patient > 75%    Lower Body Dressing/Undressing Lower body dressing      What is the patient wearing?: Pants     Lower body assist Assist for lower body dressing: 2 Helpers     Toileting Toileting Toileting Activity did not occur (Clothing management and hygiene only): N/A (no void or bm)  Toileting assist Assist for toileting: Dependent - Patient 0%     Transfers Chair/bed transfer  Transfers assist  Chair/bed transfer activity did not occur: Safety/medical concerns (patient with symptoms of OH)  Chair/bed transfer assist level: Moderate Assistance - Patient 50 - 74%     Locomotion Ambulation   Ambulation  assist   Ambulation activity did not occur: Safety/medical concerns          Walk 10 feet activity   Assist  Walk 10 feet activity did not occur: Safety/medical concerns        Walk 50 feet activity   Assist Walk 50 feet with 2 turns activity did not occur: Safety/medical concerns         Walk 150 feet activity   Assist Walk 150 feet activity did not occur: Safety/medical concerns         Walk 10 feet on uneven surface  activity   Assist Walk 10 feet on uneven surfaces activity did not occur: Safety/medical concerns         Wheelchair     Assist     Wheelchair activity did not occur: Safety/medical concerns         Wheelchair 50 feet with 2 turns activity    Assist    Wheelchair 50 feet with 2 turns activity did not occur: Safety/medical concerns       Wheelchair 150 feet activity     Assist  Wheelchair 150 feet activity did not occur: Safety/medical concerns       Blood pressure 124/74, pulse (!) 55, temperature (!) 97.2 F (36.2 C), resp. rate 14, height 5\' 11"  (1.803 m), weight 55.7 kg, SpO2 (!) 77 %.  Medical Problem List and Plan: 1. 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 2.  Impaired mobility: Continue Subcutaneous heparin             -antiplatelet therapy: Aspirin 81 mg daily 3. Residual limb/phantom pain: Schedule Tylenol 650mg  q8H.             -pt having phantom limb pain LLE. Will add low dose gabapentin tonight 100mg  qhs                         -discussed residual limb massage and visual feedback 4. Mood: Provide emotional support             -antipsychotic agents: N/A 5. Neuropsych: This patient  is capable of making decisions on own his behalf. 6. Skin/Wound Care: continue shrinker over limb for healing, edema control             -  limb guard for protection, promotion of knee extension 7. Fluids/Electrolytes/Nutrition: Routine in and outs with follow-up chemistries 8.  Acute blood loss anemia.  Follow-up CBC 9.  Hypertension.  Norvasc 10 mg daily, Tenormin 25 mg twice daily.  Monitor with increased mobility             -bp controlled at present 10.  Diabetes mellitus.  Hemoglobin A1c 5.2.  SSI.  Patient on Glucophage 500 mg daily prior to admission.   -monitor CBG's and resume as needed 11.  Hyperlipidemia.  Lipitor 12.  Tobacco/alcohol use.  Provide counseling 13. Hypomagnesemia: resolved with supplementation.  14. Hyponatremia: 131 to 128, stable at 128 on 7/10, repeat tomorrow 15. UTI: start ceftriaxone since refused oral keflex. F/u UC 16. Confusion: d/c oxycodone. Improving.  17. Insomnia/decreased appetite: start remeron 7.5mg  HS    LOS: 2 days A FACE TO FACE EVALUATION WAS PERFORMED  Clint Bolder P Jerelle Virden 05/17/2021, 3:24 PM

## 2021-05-17 NOTE — Evaluation (Signed)
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[place]) Active Problems:   PAD (peripheral artery disease) ([place])   Past Medical History:  Past Medical History:  Diagnosis Date   Diabetes mellitus without complication ([place])    Hypertension    Past Surgical History:  Past Surgical History:  Procedure Laterality Date   ABDOMINAL AORTOGRAM W/LOWER EXTREMITY N/A 05/12/2021   Procedure: ABDOMINAL AORTOGRAM W/LOWER EXTREMITY;  Surgeon: Serafina Mitchell, MD;  Location: [place];  Service: Cardiovascular;  Laterality: N/A;   AMPUTATION Left 05/13/2021   Procedure: AMPUTATION BELOW KNEE LEFT ;  Surgeon: Serafina Mitchell, MD;  Location: [facility];  Service: Vascular;  Laterality: Left;   HERNIA REPAIR     KNEE SURGERY      Assessment & Plan Clinical Impression: Patient is a 75 year old right-handed male with history of diabetes well is hypertension and tobacco/alcohol abuse.  Per chart review patient lives with spouse.  Independent prior to admission.  Multilevel home 4 steps to entry.  Spouse works during the day.  Presented 05/13/2021 with progressive left foot pain x2 months with ischemic changes and findings of decreased femoral pulses bilaterally.  Underwent ultrasound-guided left femoral artery with abdominal aortogram showing bilateral renal artery stenosis.  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  Limb was not felt to be salvageable.  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 Trula Slade.  Hospital course anemia 8.5 as well as mild  hyponatremia 130-131.  He was cleared to begin subcutaneous heparin for DVT prophylaxis.  Therapy evaluations completed due to patient decreased functional mobility was admitted for a comprehensive rehab program. Patient transferred to [facility] on 05/15/2021 .   Patient currently requires mod with mobility secondary to muscle weakness and muscle joint tightness, decreased cardiorespiratoy endurance, decreased safety awareness, decreased memory, and delayed processing, and decreased sitting balance, decreased standing balance, decreased postural control, decreased balance strategies, and difficulty maintaining precautions.  Prior to hospitalization, patient was independent  with mobility and lived with Spouse, Family in a House home.  Home access is 4Stairs to enter.  Patient will benefit from skilled PT intervention to maximize safe functional mobility, minimize fall risk, and decrease caregiver burden for planned discharge home with 24 hour supervision.  Anticipate patient will benefit from follow up [place] at discharge.  PT - End of Session Activity Tolerance: Tolerates 10 - 20 min activity with multiple rests Endurance Deficit: Yes PT Assessment Rehab Potential (ACUTE/IP ONLY): Good PT Barriers to Discharge: [place] home environment;Decreased caregiver support;Home environment access/layout;Lack of/limited family support;Weight bearing restrictions;Behavior PT Patient demonstrates impairments in the following area(s): Balance;Behavior;Edema;Endurance;Motor;Nutrition;Pain;Perception;Safety;Sensory;Skin Integrity PT Transfers Functional Problem(s): Bed Mobility;Bed to Chair;Car;Furniture PT Locomotion Functional Problem(s): Ambulation;Wheelchair Mobility;Stairs PT Plan PT Intensity: Minimum of 1-2 x/day ,45 to 90 minutes PT Frequency: 5 out of 7 days PT Duration Estimated Length of Stay: 10-14 days PT Treatment/Interventions: Ambulation/gait training;Cognitive remediation/compensation;Discharge  planning;DME/adaptive equipment instruction;Functional mobility training;Pain management;Psychosocial support;Splinting/orthotics;Therapeutic Activities;UE/LE Strength taining/ROM;Wheelchair propulsion/positioning;UE/LE Coordination activities;Therapeutic Exercise;Stair training;Skin care/wound management;Patient/family education;Neuromuscular re-education;Functional electrical stimulation;Disease management/prevention;Community reintegration;Balance/vestibular training PT Transfers Anticipated Outcome(s): supervision using LRAD PT Locomotion Anticipated Outcome(s): CGA using  LRAD 25 ft PT Recommendation Follow Up Recommendations: Home health PT Patient destination: Home Equipment Recommended: Wheelchair cushion (measurements);Wheelchair (measurements);Rolling walker with 5" wheels;Sliding board Equipment Details: 16"x18" light weight w/c with L amputee pad with standard cushion   PT Evaluation Precautions/Restrictions Precautions Precautions: Fall Precaution Comments: L- BKA Required Braces or Orthoses: Other Brace Other Brace: shrinker, limb guard Restrictions LLE Weight Bearing: Non weight bearing General PT Amount of Missed Time (min): 10 Minutes PT Missed Treatment Reason: Patient fatigue Vital Signs  Pain Pain Assessment Faces Pain Scale: Hurts little more Home Living/Prior Functioning Home Living Available Help at Discharge: Available PRN/intermittently (Patient reports his son lives with him and is home during the day and his wife works during the day, need to confirm due to patient with poor recall and confusion on evaluation) Type of Home: House Home Access: Stairs to enter Technical brewer of Steps: 4 Home Layout: Multi-level Alternate Level Stairs-Number of Steps: Reports his bed/bath in downstairs, unable to recall number of steps Bathroom Shower/Tub: Chiropodist: Standard  Lives With: Spouse;Family Prior Function Level of Independence:  Independent with basic ADLs;Independent with gait;Independent with transfers  Able to Take Stairs?: Yes Vision/Perception  Perception Perception: Within Functional Limits Praxis Praxis: Intact  Cognition Overall Cognitive Status: Impaired/Different from baseline Arousal/Alertness: Awake/alert Orientation Level: Oriented to person;Oriented to place;Oriented to situation Attention: Sustained Sustained Attention: Appears intact Memory: Impaired Memory Impairment: Decreased recall of new information;Retrieval deficit Awareness: Impaired Awareness Impairment: Intellectual impairment Problem Solving: Impaired Problem Solving Impairment: Functional basic Safety/Judgment: Impaired Comments: has fall OOB due to confusion on 7/8, said he forgot about his amputation and did not know where he was Sensation Sensation Light Touch: Impaired Detail Peripheral sensation comments: decreased sensation over stocking pattern of L LE, deminished distal>proximal (absent sensation in toes) Light Touch Impaired Details: Impaired LLE Coordination Gross Motor Movements are Fluid and Coordinated: No Fine Motor Movements are Fluid and Coordinated: Yes Coordination and Movement Description: new L BKA, limted by pain, edema, and symptoms of orthostasis Motor  Motor Motor: Abnormal postural alignment and control Motor - Skilled Clinical Observations: new L BKA, limted by pain, edema, and symptoms of orthostasis   Trunk/Postural Assessment  Cervical Assessment Cervical Assessment: Within Functional Limits Thoracic Assessment Thoracic Assessment: Within Functional Limits Lumbar Assessment Lumbar Assessment: Exceptions to [facility] (posterior pelvic tilt) Postural Control Postural Control: Deficits on evaluation (decreased/delayed)  Balance Balance Balance Assessed: Yes Static Sitting Balance Static Sitting - Balance Support: Feet supported;Bilateral upper extremity supported Static Sitting - Level of  Assistance: 5: Stand by assistance Dynamic Sitting Balance Dynamic Sitting - Balance Support: Feet supported;During functional activity Dynamic Sitting - Level of Assistance: 5: Stand by assistance Extremity Assessment  RLE Assessment RLE Assessment: Exceptions to [facility] Active Range of Motion (AROM) Comments: WFL General Strength Comments: Grossly at least 3/5 bed level, limited formal testing due to patient reporting symptoms of orthostasis LLE Assessment LLE Assessment: Exceptions to [facility] Active Range of Motion (AROM) Comments: lacking 5-10 deg from neutral for knee extension, knee flexion grossly to 90 deg limited by edema General Strength Comments: Grossly 3/5  Care Tool Care Tool Bed Mobility Roll left and right activity   Roll left and right assist level: Supervision/Verbal cueing    Sit to lying activity   Sit to lying assist level: Minimal Assistance - Patient > 75%    Lying to sitting edge of bed activity   Lying to sitting edge of bed assist level: Minimal Assistance - Patient > 75%  Care Tool Transfers Sit to stand transfer Sit to stand activity did not occur: Safety/medical concerns (patient with symptoms of OH)      Chair/bed transfer Chair/bed transfer activity did not occur: Safety/medical concerns (patient with symptoms of OH)       Psychologist, counselling transfer activity did not occur: Safety/medical concerns        Care Tool Locomotion Ambulation Ambulation activity did not occur: Safety/medical concerns        Walk 10 feet activity Walk 10 feet activity did not occur: Safety/medical concerns       Walk 50 feet with 2 turns activity Walk 50 feet with 2 turns activity did not occur: Safety/medical concerns      Walk 150 feet activity Walk 150 feet activity did not occur: Safety/medical concerns      Walk 10 feet on uneven surfaces activity Walk 10 feet on uneven surfaces activity did not occur: Safety/medical concerns       Stairs Stair activity did not occur: Safety/medical concerns        Walk up/down 1 step activity Walk up/down 1 step or curb (drop down) activity did not occur: Safety/medical concerns     Walk up/down 4 steps activity did not occuR: Safety/medical concerns  Walk up/down 4 steps activity      Walk up/down 12 steps activity Walk up/down 12 steps activity did not occur: Safety/medical concerns      Pick up small objects from floor Pick up small object from the floor (from standing position) activity did not occur: Safety/medical concerns      Wheelchair     Wheelchair activity did not occur: Safety/medical concerns      Wheel 50 feet with 2 turns activity Wheelchair 50 feet with 2 turns activity did not occur: Safety/medical concerns    Wheel 150 feet activity Wheelchair 150 feet activity did not occur: Safety/medical concerns      Refer to Care Plan for Long Term Goals  SHORT TERM GOAL WEEK 1 PT Short Term Goal 1 (Week 1): Patient with perform basic transfers with min A using LRAD. PT Short Term Goal 2 (Week 1): Patient will initiate w/c mobility >100 ft PT Short Term Goal 3 (Week 1): Patient with perform standing activities with mod A >5 min.  Recommendations for other services: None   Skilled Therapeutic Intervention Evaluation completed (see details above and below) with education on PT POC and goals and individual treatment initiated with focus on functional mobility/transfers, LE strength, dynamic standing balance/coordination, ambulation, stair navigation, simulated car transfers, and improved endurance with activity  Patient in bed upon PT arrival. Patient alert and agreeable to PT session. Patient reported 7-8/10 L residual limb and phantom pain at his L foot during session, RN made aware. PT provided repositioning, rest breaks, and distraction as pain interventions throughout session.   Vitals: Supine: BP 131/83, HR 96  Sitting: patient became "heavy headed" both  times in sitting, unable to attain vitals in time prior to patient returning to supine to determine orthostatic hypotension, applied thigh high TED hose to R lower extremity to manage symptoms, RN aware and in agreement  Amputee Education: Provided the following education throughout session.  Shrinker: Wear directly on incision, donning technique, change every 1-2 days, wash with soap and water ROM: Knee extension, Limb guard (donning, doffing, wear schedule, purpose) Pain management: Med management, Diaphragmatic breathing or guided imagery, Desensitization techniques  Donned shrinker and limb  guard with total A while providing education.  Therapeutic Activity: Bed Mobility: Patient performed rolling R/L with supervision to don pants with max A bed level and he performed supine to/from sit with min A for L residual limb management x1 and supervision with cues on the second trial. Provided verbal cues for L hip abduction and pushing up on his elbow to sit up. All bed mobility performed in a flat bed without use of bed rails to simulate home set up.  Transfers: Patient attempted a lateral scoot transfer to the w/c, initiated with min A, however, he stopped at the EOB and reported feeling "heavy-headed" and returned to supine, as above.   Patient reported increased fatigue and residual limb pain after returning to lying the second time, and requested to rest at this time. Patient missed 10 min of skilled PT due to fatigue/pain, RN made aware. Will attempt to make-up missed time as able.    Instructed pt in results of PT evaluation as detailed above, PT POC, rehab potential, rehab goals, and discharge recommendations. Additionally discussed CIR's policies regarding fall safety and use of chair alarm and/or quick release belt. Pt verbalized understanding and in agreement. Will update pt's family members as they become available.   Patient in bed with his eyes closed at end of session with breaks locked,  bed alarm set, and all needs within reach.   Discharge Criteria: Patient will be discharged from PT if patient refuses treatment 3 consecutive times without medical reason, if treatment goals not met, if there is a change in medical status, if patient makes no progress towards goals or if patient is discharged from hospital.  The above assessment, treatment plan, treatment alternatives and goals were discussed and mutually agreed upon: by patient  Bayle Calvo L Treydon Henricks PT, DPT  05/17/2021, 1:00 PM

## 2021-05-17 NOTE — Progress Notes (Signed)
Patient c/o pain on his left leg pain med given. He is more alert and able to express his need. Took sips of water and spoonfuls of apple sauce but did not eat breakfast. Took meds crushed.We'll continue to monitor.

## 2021-05-17 NOTE — Progress Notes (Signed)
I And O cath done; some resistance noted with straight cath. MD notified new orders noted.

## 2021-05-17 NOTE — Discharge Instructions (Signed)
Inpatient Rehab Discharge Instructions  Todd Duran Discharge date and time: No discharge date for patient encounter.   Activities/Precautions/ Functional Status: Activity: As tolerated Diet: Diabetic diet Wound Care: Routine skin checks Functional status:  ___ No restrictions     ___ Walk up steps independently ___ 24/7 supervision/assistance   ___ Walk up steps with assistance ___ Intermittent supervision/assistance  __x_ Bathe/dress independently ___ Walk with walker     ___ Bathe/dress with assistance ___ Walk Independently    ___ Shower independently ___ Walk with assistance    ___ Shower with assistance ___ No alcohol     ___ Return to work/school ________  Special Instructions: No driving smoking or alcohol   My questions have been answered and I understand these instructions. I will adhere to these goals and the provided educational materials after my discharge from the hospital.  Patient/Caregiver Signature _______________________________ Date __________  Clinician Signature _______________________________________ Date __________  Please bring this form and your medication list with you to all your follow-up doctor's appointments.

## 2021-05-17 NOTE — IPOC Note (Signed)
Overall Plan of Care [facility]) Patient Details Name: Todd Duran MRN: [id number] DOB: 30-Jun-1946  Admitting Diagnosis: Left below-knee amputee [facility])  Hospital Problems: Principal Problem:   Left below-knee amputee [facility]) Active Problems:   PAD (peripheral artery disease) (HCC)     Functional Problem List: Nursing Behavior, Edema, Endurance, Medication Management, Pain, Safety, Skin Integrity  PT Balance, Behavior, Edema, Endurance, Motor, Nutrition, Pain, Perception, Safety, Sensory, Skin Integrity  OT Balance, Safety, Sensory, Cognition, Edema, Endurance, Motor, Pain, Perception  SLP    TR         Basic ADL's: OT Grooming, Bathing, Dressing, Toileting     Advanced  ADL's: OT       Transfers: PT Bed Mobility, Bed to Chair, Car, Occupational psychologist, Research scientist (life sciences): PT Ambulation, Psychologist, prison and probation services, Stairs     Additional Impairments: OT None  SLP        TR      Anticipated Outcomes Item Anticipated Outcome  Self Feeding N/A  Swallowing      Basic self-care  Marketing executive Transfers Supervision  Bowel/Bladder  N/A  Transfers  supervision using LRAD  Locomotion  CGA using LRAD 25 ft  Communication     Cognition     Pain  < 4  Safety/Judgment  Supervision and no falls   Therapy Plan: PT Intensity: Minimum of 1-2 x/day ,45 to 90 minutes PT Frequency: 5 out of 7 days PT Duration Estimated Length of Stay: 10-14 days OT Intensity: Minimum of 1-2 x/day, 45 to 90 minutes OT Frequency: 5 out of 7 days OT Duration/Estimated Length of Stay: 10-12 days     Due to the current state of emergency, patients may not be receiving their 3-hours of Medicare-mandated therapy.   Team Interventions: Nursing Interventions Patient/Family Education, Disease Management/Prevention, Pain Management, Medication Management, Skin Care/Wound Management, Discharge Planning, Psychosocial Support  PT interventions Ambulation/gait  training, Cognitive remediation/compensation, Discharge planning, DME/adaptive equipment instruction, Functional mobility training, Pain management, Psychosocial support, Splinting/orthotics, Therapeutic Activities, UE/LE Strength taining/ROM, Wheelchair propulsion/positioning, UE/LE Coordination activities, Therapeutic Exercise, Stair training, Skin care/wound management, Patient/family education, Neuromuscular re-education, Functional electrical stimulation, Disease management/prevention, Firefighter, Warden/ranger  OT Interventions Warden/ranger, Self Care/advanced ADL retraining, Therapeutic Exercise, Wheelchair propulsion/positioning, Disease mangement/prevention, Cognitive remediation/compensation, DME/adaptive equipment instruction, Pain management, Skin care/wound managment, UE/LE Strength taining/ROM, Community reintegration, Development worker, international aid stimulation, Patient/family education, Splinting/orthotics, UE/LE Coordination activities, Discharge planning, Functional mobility training, Psychosocial support, Visual/perceptual remediation/compensation, Therapeutic Activities  SLP Interventions    TR Interventions    SW/CM Interventions     Barriers to Discharge MD  Medical stability  Nursing Decreased caregiver support, Home environment access/layout, Wound Care, Lack of/limited family support, Weight bearing restrictions, Medication compliance, Behavior Lives with wife in multi-level home. 4 steps to enter with right and left rails, can reah both. 4 steps to access bedroom. Wife works but taking Transport planner and will be available 24/7.  PT Inaccessible home environment, Decreased caregiver support, Home environment access/layout, Lack of/limited family support, Weight bearing restrictions, Behavior    OT      SLP      SW       Team Discharge Planning: Destination: PT-Home ,OT- Home , SLP-  Projected Follow-up: PT-Home health PT, OT-  Other (comment) (TBD),  SLP-  Projected Equipment Needs: PT-Wheelchair cushion (measurements), Wheelchair (measurements), Rolling walker with 5" wheels, Sliding board, OT- To be determined, SLP-  Equipment Details: PT-16"x18" light weight w/c with L  amputee pad with standard cushion, OT-  Patient/family involved in discharge planning: PT- Patient,  OT-Patient, SLP-   MD ELOS: 10-12 days Medical Rehab Prognosis:  Excellent Assessment: The patient has been admitted for CIR therapies with the diagnosis of left BKA. The team will be addressing functional mobility, strength, stamina, balance, safety, adaptive techniques and equipment, self-care, bowel and bladder mgt, patient and caregiver education, pre-prosthetic education, pain control, community reentry. Goals have been set at supervision for self-care and CGA to supervision for transfers and locomotion.   Due to the current state of emergency, patients may not be receiving their 3 hours per day of Medicare-mandated therapy.    Ranelle Oyster, MD, FAAPMR     See Team Conference Notes for weekly updates to the plan of care

## 2021-05-17 NOTE — Plan of Care (Signed)
  Problem: RH SKIN INTEGRITY Goal: RH STG MAINTAIN SKIN INTEGRITY WITH ASSISTANCE Description: STG Maintain Skin Integrity With supervision Assistance. Outcome: Progressing   Problem: RH SAFETY Goal: RH STG ADHERE TO SAFETY PRECAUTIONS W/ASSISTANCE/DEVICE Description: STG Adhere to Safety Precautions With mod I Assistance/Device. Outcome: Not Progressing; hx of fall; some confusion todaY Problem: RH PAIN MANAGEMENT Goal: RH STG PAIN MANAGED AT OR BELOW PT'S PAIN GOAL Description: < 4 on a 0-10 pain scale. Outcome: Not Progressing; tylenol changed to TID per MD Problem: RH KNOWLEDGE DEFICIT LIMB LOSS Goal: RH STG INCREASE KNOWLEDGE OF SELF CARE AFTER LIMB LOSS Description: Patient will be able to demonstrate knowledge of medication management, pain management, skin/wound care, weightbearing precautions, residual limb care with educational materials and handouts provided by staff independently at discharge. Outcome: Not Progressing; on and off confusion today

## 2021-05-18 DIAGNOSIS — R400 Somnolence: Secondary | ICD-10-CM | POA: Diagnosis not present

## 2021-05-18 DIAGNOSIS — E871 Hypo-osmolality and hyponatremia: Secondary | ICD-10-CM | POA: Diagnosis not present

## 2021-05-18 DIAGNOSIS — D62 Acute posthemorrhagic anemia: Secondary | ICD-10-CM

## 2021-05-18 DIAGNOSIS — Z89512 Acquired absence of left leg below knee: Secondary | ICD-10-CM | POA: Diagnosis not present

## 2021-05-18 DIAGNOSIS — I739 Peripheral vascular disease, unspecified: Secondary | ICD-10-CM | POA: Diagnosis not present

## 2021-05-18 LAB — CBC WITH DIFFERENTIAL/PLATELET
Abs Immature Granulocytes: 0.48 10*3/uL — ABNORMAL HIGH (ref 0.00–0.07)
Basophils Absolute: 0 10*3/uL (ref 0.0–0.1)
Basophils Relative: 0 %
Eosinophils Absolute: 0 10*3/uL (ref 0.0–0.5)
Eosinophils Relative: 0 %
HCT: 20.9 % — ABNORMAL LOW (ref 39.0–52.0)
Hemoglobin: 6.7 g/dL — CL (ref 13.0–17.0)
Immature Granulocytes: 2 %
Lymphocytes Relative: 3 %
Lymphs Abs: 0.7 10*3/uL (ref 0.7–4.0)
MCH: 27.8 pg (ref 26.0–34.0)
MCHC: 32.1 g/dL (ref 30.0–36.0)
MCV: 86.7 fL (ref 80.0–100.0)
Monocytes Absolute: 1 10*3/uL (ref 0.1–1.0)
Monocytes Relative: 4 %
Neutro Abs: 19.9 10*3/uL — ABNORMAL HIGH (ref 1.7–7.7)
Neutrophils Relative %: 91 %
Platelets: 478 10*3/uL — ABNORMAL HIGH (ref 150–400)
RBC: 2.41 MIL/uL — ABNORMAL LOW (ref 4.22–5.81)
RDW: 13 % (ref 11.5–15.5)
WBC: 22.1 10*3/uL — ABNORMAL HIGH (ref 4.0–10.5)
nRBC: 0 % (ref 0.0–0.2)

## 2021-05-18 LAB — COMPREHENSIVE METABOLIC PANEL
ALT: 22 U/L (ref 0–44)
AST: 75 U/L — ABNORMAL HIGH (ref 15–41)
Albumin: 1.8 g/dL — ABNORMAL LOW (ref 3.5–5.0)
Alkaline Phosphatase: 115 U/L (ref 38–126)
Anion gap: 13 (ref 5–15)
BUN: 34 mg/dL — ABNORMAL HIGH (ref 8–23)
CO2: 23 mmol/L (ref 22–32)
Calcium: 9.8 mg/dL (ref 8.9–10.3)
Chloride: 92 mmol/L — ABNORMAL LOW (ref 98–111)
Creatinine, Ser: 1.31 mg/dL — ABNORMAL HIGH (ref 0.61–1.24)
GFR, Estimated: 57 mL/min — ABNORMAL LOW (ref >60)
Glucose, Bld: 138 mg/dL — ABNORMAL HIGH (ref 70–99)
Potassium: 3.8 mmol/L (ref 3.5–5.1)
Sodium: 128 mmol/L — ABNORMAL LOW (ref 135–145)
Total Bilirubin: 0.8 mg/dL (ref 0.3–1.2)
Total Protein: 7.8 g/dL (ref 6.5–8.1)

## 2021-05-18 LAB — PREPARE RBC (CROSSMATCH)

## 2021-05-18 LAB — URINE CULTURE: Culture: NO GROWTH

## 2021-05-18 LAB — GLUCOSE, CAPILLARY
Glucose-Capillary: 129 mg/dL — ABNORMAL HIGH (ref 70–99)
Glucose-Capillary: 151 mg/dL — ABNORMAL HIGH (ref 70–99)
Glucose-Capillary: 126 mg/dL — ABNORMAL HIGH (ref 70–99)
Glucose-Capillary: 163 mg/dL — ABNORMAL HIGH (ref 70–99)

## 2021-05-18 LAB — HEMOGLOBIN AND HEMATOCRIT, BLOOD
HCT: 20.9 % — ABNORMAL LOW (ref 39.0–52.0)
Hemoglobin: 6.7 g/dL — CL (ref 13.0–17.0)

## 2021-05-18 MED ORDER — ACETAMINOPHEN 325 MG PO TABS
650.0000 mg | ORAL_TABLET | Freq: Once | ORAL | Status: AC
  Administered 2021-05-18: 650 mg via ORAL
  Filled 2021-05-18: qty 2

## 2021-05-18 MED ORDER — FUROSEMIDE 10 MG/ML IJ SOLN
20.0000 mg | Freq: Once | INTRAMUSCULAR | Status: AC
  Administered 2021-05-18: 20 mg via INTRAVENOUS
  Filled 2021-05-18: qty 2

## 2021-05-18 MED ORDER — DIPHENHYDRAMINE HCL 25 MG PO CAPS
25.0000 mg | ORAL_CAPSULE | Freq: Once | ORAL | Status: AC
  Administered 2021-05-18: 25 mg via ORAL
  Filled 2021-05-18: qty 1

## 2021-05-18 MED ORDER — SODIUM CHLORIDE 0.9% IV SOLUTION: Freq: Once | INTRAVENOUS | Status: AC

## 2021-05-18 NOTE — Progress Notes (Signed)
Critical lab value reported to this nurse of HGB 6.7. Called PA and notified. Will continue to monitor.

## 2021-05-18 NOTE — Progress Notes (Signed)
Physical Therapy Session Note  Patient Details  Name: Todd Duran MRN: [id number] Date of Birth: [birth year]  Today's Date: 05/18/2021 PT Individual Time: 1005-1055 PT Individual Time Calculation (min): 50 min   Short Term Goals: Week 1:  PT Short Term Goal 1 (Week 1): Patient with perform basic transfers with min A using LRAD. PT Short Term Goal 2 (Week 1): Patient will initiate w/c mobility >100 ft PT Short Term Goal 3 (Week 1): Patient with perform standing activities with mod A >5 min.  Skilled Therapeutic Interventions/Progress Updates:     Patient in bed upon PT arrival. Patient alert and agreeable to PT session. Patient reported 10/10 L residual limb and L knee pain during session, RN made aware and provided Tylenol during session. PT provided repositioning, rest breaks, and distraction as pain interventions throughout session. Per discussion with RN, patient with low hemoglobin and recommended to progress with mobility based on patient's symptoms. Patient did not tolerate any seated or OOB mobility due to elevated pain levels. Focused on patient education, limb wrapping/care, and bed level exercises for strengthening/ROM.   Patient with new hematoma to lateral distal end of residual limb, boggy to palpation, with minimal serous drainage for lateral side of his incision. RN and MD made aware.  Patient's shrinker sock on the floor and soiled this morning per OT who washed the shrinker during their session. PT retrieved and applied a non-adherent dressing, Kerlix, and 1-4" ACE wrap, applied wrap x2 due to patient complaints of burning with tightness of wrap. Educated patient on figure 8 wrapping technique and importance of protecting his residual limb from trauma and pressure injuries. Donned limb guard with total A, as patient reported that he was in too much pain to put it on himself.   Therapeutic Exercise: Patient performed the following L lower extremity exercises with verbal and  tactile cues for proper technique. -SLR 2x5 -hip/knee flexion/extension 2x5 -hip abd/add 2x5 -quad sets x10 with 5 sec hold -R bridging x5  Patient in bed at end of session with breaks locked, bed alarm set, and all needs within reach. Patient asked to rest due to the pain in his residual limb and declined further therapeutic exercise or functional mobility at this time. Patient missed 10 min of skilled PT due to pain, RN made aware. Will attempt to make-up missed time as able.    Therapy Documentation Precautions:  Precautions Precautions: Fall Precaution Comments: L- BKA Required Braces or Orthoses: Other Brace Other Brace: shrinker, limb guard Restrictions Weight Bearing Restrictions: Yes LLE Weight Bearing: Non weight bearing General: PT Amount of Missed Time (min): 10 Minutes PT Missed Treatment Reason: Pain   Therapy/Group: Individual Therapy  Todd Duran PT, DPT  05/18/2021, 4:30 PM

## 2021-05-18 NOTE — Progress Notes (Signed)
Occupational Therapy Session Note  Patient Details  Name: Todd Duran MRN: [id number] Date of Birth: 11-14-1945  Today's Date: 05/18/2021 OT Individual Time: 0700-0757 OT Individual Time Calculation (min): 57 min    Short Term Goals: Week 1:  OT Short Term Goal 1 (Week 1): Pt will complete LB dressing with min A and use of AE, as needed. OT Short Term Goal 2 (Week 1): Pt will complete toileting task with min A. OT Short Term Goal 3 (Week 1): Pt will complete functional transfers (toilet, shower) with min A. OT Short Term Goal 4 (Week 1): To promote UB strength and functional endurance of ADLs, pt will engage in UB strengthening x5 min without rest break.  Skilled Therapeutic Interventions/Progress Updates:    1:1. Pt received in bed asleep requiring increased time to arouse. Pillow under residual limb knee. Pt completes BADL at bed level initially d/t lethargy. Pt completes UB dressing with MIN A to pull shirt up back with BUE on bed rails. Pt complete LB dressing with total A at bed level with VC for 1LE bridge and VC for WB precuations on LLE. Pt shrinker on floor and therefore washed and ace wrapped with non adherent pad on incision/applied limb protector. Educated pt and reviewed with NT to not put pillow under knee to reduce risk of contracture. Pt attempts to sit EOB to eat, however continues to have mini LOB ot L therefore returned to bed level to finish breakfast. Exited session with pt seated in bed, exit alarm on and call light in reach   Therapy Documentation Precautions:  Precautions Precautions: Fall Precaution Comments: L- BKA Required Braces or Orthoses: Other Brace Other Brace: shrinker, limb guard Restrictions Weight Bearing Restrictions: Yes LLE Weight Bearing: Non weight bearing General:   Vital Signs: Therapy Vitals Temp: 99.2 F (37.3 C) Temp Source: Oral Pulse Rate: 100 BP: 127/85 Patient Position (if appropriate): Lying Oxygen Therapy SpO2: 99 % O2  Device: Room Air Pain:   ADL:   Vision   Perception    Praxis   Exercises:   Other Treatments:     Therapy/Group: Individual Therapy  [name] 05/18/2021, 7:46 AM

## 2021-05-18 NOTE — Care Management (Signed)
Inpatient Rehabilitation Center Individual Statement of Services  Patient Name:  Todd Duran  Date:  05/18/2021  Welcome to the Inpatient Rehabilitation Center.  Our goal is to provide you with an individualized program based on your diagnosis and situation, designed to meet your specific needs.  With this comprehensive rehabilitation program, you will be expected to participate in at least 3 hours of rehabilitation therapies Monday-Friday, with modified therapy programming on the weekends.  Your rehabilitation program will include the following services:  Physical Therapy (PT), Occupational Therapy (OT), 24 hour per day rehabilitation nursing, Therapeutic Recreaction (TR), Psychology, Neuropsychology, Care Coordinator, Rehabilitation Medicine, Nutrition Services, Pharmacy Services, and Other  Weekly team conferences will be held on Tuesdays to discuss your progress.  Your Inpatient Rehabilitation Care Coordinator will talk with you frequently to get your input and to update you on team discussions.  Team conferences with you and your family in attendance may also be held.  Expected length of stay: 10-14 days   Overall anticipated outcome: Supervision  Depending on your progress and recovery, your program may change. Your Inpatient Rehabilitation Care Coordinator will coordinate services and will keep you informed of any changes. Your Inpatient Rehabilitation Care Coordinator's name and contact numbers are listed  below.  The following services may also be recommended but are not provided by the Inpatient Rehabilitation Center:  Driving Evaluations Home Health Rehabiltiation Services Outpatient Rehabilitation Services Vocational Rehabilitation   Arrangements will be made to provide these services after discharge if needed.  Arrangements include referral to agencies that provide these services.  Your insurance has been verified to be:  [facility]  Your primary doctor is:  [facility] P[facility]  Pertinent  information will be shared with your doctor and your insurance company.  Inpatient Rehabilitation Care Coordinator:  Susie Cassette [phone] or ([phone]  Information discussed with and copy given to patient by: Gretchen Short, 05/18/2021, 10:16 AM

## 2021-05-18 NOTE — Progress Notes (Addendum)
PROGRESS NOTE   Subjective/Complaints: Pt appears more alert. Has some discomfort in leg but says it's tolerable. Therapy reported bleeding from incision. Pt denies striking leg. Has been wearing limb guard.   ROS: Patient denies fever, rash, sore throat, blurred vision, nausea, vomiting, diarrhea, cough, shortness of breath or chest pain,  headache, or mood change.    Objective:   No results found. Recent Labs    05/18/21 0712  WBC 22.1*  HGB 6.7*  HCT 20.9*  PLT 478*   Recent Labs    05/17/21 0506 05/18/21 0712  NA 128* 128*  K 4.1 3.8  CL 92* 92*  CO2 24 23  GLUCOSE 109* 138*  BUN 22 34*  CREATININE 1.03 1.31*  CALCIUM 9.8 9.8    Intake/Output Summary (Last 24 hours) at 05/18/2021 1144 Last data filed at 05/18/2021 0833 Gross per 24 hour  Intake 600 ml  Output 870 ml  Net -270 ml        Physical Exam: Vital Signs Blood pressure 127/85, pulse 100, temperature 99.2 F (37.3 C), temperature source Oral, resp. rate 16, height 5\' 11"  (1.803 m), weight 55.7 kg, SpO2 99 %. Constitutional: No distress . Vital signs reviewed. HEENT: EOMI, oral membranes moist Neck: supple Cardiovascular: RRR without murmur. No JVD    Respiratory/Chest: CTA Bilaterally without wheezes or rales. Normal effort    GI/Abdomen: BS +, non-tender, non-distended Ext: no clubbing, cyanosis, or edema Psych: pleasant, quiet Musculoskeletal:    Cervical back: Normal range of motion.    Comments: Left residual limb tender, dressed. Limb guard Skin:    General: Skin is warm.    Comments:    Staples in place, incision well-approximated, large area just above/along incision measuring 3-4" in diameter which is dark, boggy. Incision/dressing has odor.   Neurological:    Mental Status: He is alert.    Comments: Patient is more alert in no acute distress.  Oriented x3 and follows commands. UE 5/5. RLE 4/5 prox to distal. Lifts left leg off  bed. No focal sensory findings.      Assessment/Plan: 1. Functional deficits which require 3+ hours per day of interdisciplinary therapy in a comprehensive inpatient rehab setting. Physiatrist is providing close team supervision and 24 hour management of active medical problems listed below. Physiatrist and rehab team continue to assess barriers to discharge/monitor patient progress toward functional and medical goals  Care Tool:  Bathing    Body parts bathed by patient: Left arm, Chest, Abdomen, Face   Body parts bathed by helper: Right arm, Right lower leg, Left upper leg Body parts n/a: Left lower leg   Bathing assist Assist Level: Moderate Assistance - Patient 50 - 74%     Upper Body Dressing/Undressing Upper body dressing   What is the patient wearing?: Pull over shirt    Upper body assist Assist Level: Minimal Assistance - Patient > 75%    Lower Body Dressing/Undressing Lower body dressing      What is the patient wearing?: Pants     Lower body assist Assist for lower body dressing: 2 Helpers     Toileting Toileting Toileting Activity did not occur (Clothing management and hygiene only):  N/A (no void or bm)  Toileting assist Assist for toileting: Dependent - Patient 0%     Transfers Chair/bed transfer  Transfers assist  Chair/bed transfer activity did not occur: Safety/medical concerns (patient with symptoms of OH)  Chair/bed transfer assist level: Moderate Assistance - Patient 50 - 74%     Locomotion Ambulation   Ambulation assist   Ambulation activity did not occur: Safety/medical concerns          Walk 10 feet activity   Assist  Walk 10 feet activity did not occur: Safety/medical concerns        Walk 50 feet activity   Assist Walk 50 feet with 2 turns activity did not occur: Safety/medical concerns         Walk 150 feet activity   Assist Walk 150 feet activity did not occur: Safety/medical concerns         Walk 10  feet on uneven surface  activity   Assist Walk 10 feet on uneven surfaces activity did not occur: Safety/medical concerns         Wheelchair     Assist     Wheelchair activity did not occur: Safety/medical concerns         Wheelchair 50 feet with 2 turns activity    Assist    Wheelchair 50 feet with 2 turns activity did not occur: Safety/medical concerns       Wheelchair 150 feet activity     Assist  Wheelchair 150 feet activity did not occur: Safety/medical concerns       Blood pressure 127/85, pulse 100, temperature 99.2 F (37.3 C), temperature source Oral, resp. rate 16, height 5\' 11"  (1.803 m), weight 55.7 kg, SpO2 99 %.  Medical Problem List and Plan: 1. Debility secondary to left BKA 05/13/2021             -patient may shower if incision is covered             -ELOS/Goals: 7-10 days, supervision with PT and OT    -demonstrated more engagement with OT this morning, but it was early. Seemed pretty alert when I saw him 90 min later 2.  Impaired mobility: Continue Subcutaneous heparin             -antiplatelet therapy: Aspirin 81 mg daily 3. Residual limb/phantom pain: Schedule Tylenol 650mg  q8H.             - phantom limb pain LLE. Continue low dose gabapentin 100mg  qhs                         -discussed residual limb massage and visual feedback 4. Mood: Remeron added for insomnia, appetite 7.5mg  qhs             -antipsychotic agents: N/A 5. Neuropsych: This patient is capable of making decisions on own his behalf. 6. Skin/Wound Care: continue shrinker over limb for healing, edema control             -limb guard for protection, promotion of knee extension  -boggy/necrotic area concerning along/above incision. Continue dry dressings, changing to keep dry. Will let Dr. 07/14/2021 know also.  7. Fluids/Electrolytes/Nutrition: encourage PO fluids  I personally reviewed the patient's labs today.  BUN/Cr elevated 8.  Acute blood loss anemia. 7/11 Follow-up  hgb 6.7 today--f/u lab was the same   -transfuse 2u PRBC 9.  Hypertension.  Norvasc 10 mg daily, Tenormin 25 mg twice daily.  Monitor with increased  mobility             -bp controlled at present, stop hctz given low na+ 10.  Diabetes mellitus.  Hemoglobin A1c 5.2.  SSI.  Patient on Glucophage 500 mg daily prior to admission.   -monitor CBG's and resume as needed -7/11 fair control 11.  Hyperlipidemia.  Lipitor 12.  Tobacco/alcohol use.  Provide counseling 13. Hypomagnesemia: resolved with supplementation.  14. Hyponatremia: 131 to 128, stable at 128 on 7/10 and 7/11  -stop hctz 15. UTI: start ceftriaxone since refused oral keflex--UC negative--stop abx 7/11 16. Confusion: d/c oxycodone. Definitely improving.       LOS: 3 days A FACE TO FACE EVALUATION WAS PERFORMED  Ranelle Oyster 05/18/2021, 11:44 AM

## 2021-05-18 NOTE — Progress Notes (Signed)
Occupational Therapy Session Note  Patient Details  Name: Todd Duran MRN: [id number] Date of Birth: 1945-12-24  Today's Date: 05/18/2021 OT Individual Time: 1300-1355 OT Individual Time Calculation (min): 55 min    Short Term Goals: Week 1:  OT Short Term Goal 1 (Week 1): Pt will complete LB dressing with min A and use of AE, as needed. OT Short Term Goal 2 (Week 1): Pt will complete toileting task with min A. OT Short Term Goal 3 (Week 1): Pt will complete functional transfers (toilet, shower) with min A. OT Short Term Goal 4 (Week 1): To promote UB strength and functional endurance of ADLs, pt will engage in UB strengthening x5 min without rest break.  Skilled Therapeutic Interventions/Progress Updates:    Pt received in bed and consented to OT tx. Per RN, pt still a little confused (has UTI) and low hemoglobin but asymptomatic. Reports repeat labs will be done soon, to monitor for symptoms. Pt req mod A to transition to sit EOB, and trained in SB functional transfers req mod A and max cuing for proper hand placement and sequencing with poor carryover. Once in w/c pt brought down to therapy gym and instructed in light (2#dowel rod) BUE strengthening HEP to increase strength and activity tolerance for ADLs and transfers. Pt instructed in shoulder press, chest press, elbow flexion and shoulder flexion all for 3x10 with mod cuing for proper technique and reps with fair carryover. Pt then instructed to sit in w/c and navigate w/c out into hallway and maneuver around obstacles. Pt required rest breaks due to fatigue, therapist pushed him the ret of the way for time. Pt instructed in stand pivot transfer back to bed req total assist, helped to position comfortably in bed with mod A and left with all needs met, bed alarm on.   Therapy Documentation Precautions:  Precautions Precautions: Fall Precaution Comments: L- BKA Required Braces or Orthoses: Other Brace Other Brace: shrinker, limb  guard Restrictions Weight Bearing Restrictions: Yes LLE Weight Bearing: Non weight bearing  Vital Signs: Therapy Vitals Temp: (!) 97.5 F (36.4 C) Pulse Rate: 87 Resp: 18 BP: 100/60 Patient Position (if appropriate): Lying Oxygen Therapy SpO2: 96 % O2 Device: Room Air Pain: Pain Assessment Pain Score: 3  Faces Pain Scale: Hurts a little bit PAINAD (Pain Assessment in Advanced Dementia) Breathing: normal Negative Vocalization: none    Therapy/Group: Individual Therapy  Levelle Edelen 05/18/2021, 1:50 PM

## 2021-05-18 NOTE — Progress Notes (Signed)
Inpatient Rehabilitation  Patient information reviewed and entered into eRehab system by Mena Lienau M. Virgel Haro, M.A., CCC/SLP, PPS Coordinator.  Information including medical coding, functional ability and quality indicators will be reviewed and updated through discharge.    

## 2021-05-18 NOTE — Progress Notes (Signed)
[facility]  Subjective  -he denies any associated falls or trauma associated with his left BKA.   Objective 113/72 86 98.6 F (37 C) 18 99%  Intake/Output Summary (Last 24 hours) at 05/18/2021 1802 Last data filed at 05/18/2021 1300 Gross per 24 hour  Intake 600 ml  Output 870 ml  Net -270 ml    Left BKA with ecchymosis as pictured below and foul smell     Laboratory Lab Results: Recent Labs    05/18/21 0712 05/18/21 1230  WBC 22.1*  --   HGB 6.7* 6.7*  HCT 20.9* 20.9*  PLT 478*  --    BMET Recent Labs    05/17/21 0506 05/18/21 0712  NA 128* 128*  K 4.1 3.8  CL 92* 92*  CO2 24 23  GLUCOSE 109* 138*  BUN 22 34*  CREATININE 1.03 1.31*  CALCIUM 9.8 9.8    COAG No results found for: INR, PROTIME No results found for: PTT  Assessment/Planning: 75 year old male status post left BKA on 05/13/2021 by Dr. Myra Gianotti.  Vascular was called to assess the left BKA stump.  There is a fair amount of ecchymosis and a foul smell.  I did remove about 4-5 staples from the incision hoping to drain underlying abscess given his white count is now 22.  I did not get any significant drainage and the tissue underneath the incision does appear ischemic.  I think ultimately he is likely going to require above-knee amputation as I discussed with him and his family.  We will continue to follow to see how the wound progresses.  Cephus Shelling 05/18/2021 6:02 PM --

## 2021-05-19 DIAGNOSIS — R400 Somnolence: Secondary | ICD-10-CM | POA: Diagnosis not present

## 2021-05-19 DIAGNOSIS — E871 Hypo-osmolality and hyponatremia: Secondary | ICD-10-CM | POA: Diagnosis not present

## 2021-05-19 DIAGNOSIS — Z89512 Acquired absence of left leg below knee: Secondary | ICD-10-CM | POA: Diagnosis not present

## 2021-05-19 DIAGNOSIS — I739 Peripheral vascular disease, unspecified: Secondary | ICD-10-CM | POA: Diagnosis not present

## 2021-05-19 LAB — TYPE AND SCREEN
ABO/RH(D): O POS
Antibody Screen: NEGATIVE
Unit division: 0
Unit division: 0

## 2021-05-19 LAB — CBC
HCT: 25.6 % — ABNORMAL LOW (ref 39.0–52.0)
Hemoglobin: 8.8 g/dL — ABNORMAL LOW (ref 13.0–17.0)
MCH: 28.1 pg (ref 26.0–34.0)
MCHC: 34.4 g/dL (ref 30.0–36.0)
MCV: 81.8 fL (ref 80.0–100.0)
Platelets: 450 10*3/uL — ABNORMAL HIGH (ref 150–400)
RBC: 3.13 MIL/uL — ABNORMAL LOW (ref 4.22–5.81)
RDW: 14.6 % (ref 11.5–15.5)
WBC: 23.4 10*3/uL — ABNORMAL HIGH (ref 4.0–10.5)
nRBC: 0 % (ref 0.0–0.2)

## 2021-05-19 LAB — GLUCOSE, CAPILLARY
Glucose-Capillary: 109 mg/dL — ABNORMAL HIGH (ref 70–99)
Glucose-Capillary: 118 mg/dL — ABNORMAL HIGH (ref 70–99)
Glucose-Capillary: 104 mg/dL — ABNORMAL HIGH (ref 70–99)
Glucose-Capillary: 89 mg/dL (ref 70–99)

## 2021-05-19 LAB — BPAM RBC
Blood Product Expiration Date: 202207212359
Blood Product Expiration Date: 202208032359
ISSUE DATE / TIME: 202207111657
ISSUE DATE / TIME: 202207112234
Unit Type and Rh: 5100
Unit Type and Rh: 5100

## 2021-05-19 MED ORDER — HEPARIN SODIUM (PORCINE) 5000 UNIT/ML IJ SOLN: 5000.0000 [IU] | Freq: Three times a day (TID) | INTRAMUSCULAR | Status: DC

## 2021-05-19 MED ORDER — ACETAMINOPHEN 325 MG PO TABS: 650.0000 mg | ORAL_TABLET | Freq: Three times a day (TID) | ORAL | Status: DC

## 2021-05-19 MED ORDER — ATENOLOL 25 MG PO TABS: 25.0000 mg | ORAL_TABLET | Freq: Two times a day (BID) | ORAL | Status: DC

## 2021-05-19 MED ORDER — OXYBUTYNIN CHLORIDE 5 MG PO TABS: 2.5000 mg | ORAL_TABLET | Freq: Two times a day (BID) | ORAL | Status: AC

## 2021-05-19 MED ORDER — GABAPENTIN 100 MG PO CAPS: 100.0000 mg | ORAL_CAPSULE | Freq: Every day | ORAL | Status: DC

## 2021-05-19 MED ORDER — SENNOSIDES-DOCUSATE SODIUM 8.6-50 MG PO TABS: 1.0000 | ORAL_TABLET | Freq: Two times a day (BID) | ORAL | Status: DC

## 2021-05-19 MED ORDER — ADULT MULTIVITAMIN W/MINERALS CH: 1.0000 | ORAL_TABLET | Freq: Every day | ORAL | Status: DC

## 2021-05-19 MED ORDER — ASPIRIN 81 MG PO TBEC: 81.0000 mg | DELAYED_RELEASE_TABLET | Freq: Every day | ORAL | 11 refills | Status: DC

## 2021-05-19 MED ORDER — FOLIC ACID 1 MG PO TABS: 1.0000 mg | ORAL_TABLET | Freq: Every day | ORAL | Status: DC

## 2021-05-19 MED ORDER — INSULIN ASPART 100 UNIT/ML IJ SOLN: 0.0000 [IU] | Freq: Three times a day (TID) | INTRAMUSCULAR | 11 refills | Status: DC

## 2021-05-19 MED ORDER — THIAMINE HCL 100 MG PO TABS: 100.0000 mg | ORAL_TABLET | Freq: Every day | ORAL | Status: DC

## 2021-05-19 MED ORDER — POLYETHYLENE GLYCOL 3350 17 G PO PACK: 17.0000 g | PACK | Freq: Every day | ORAL | 0 refills | Status: DC | PRN

## 2021-05-19 MED ORDER — ACETAMINOPHEN 325 MG PO TABS: 650.0000 mg | ORAL_TABLET | ORAL | Status: DC | PRN

## 2021-05-19 MED ORDER — MIRTAZAPINE 7.5 MG PO TABS: 7.5000 mg | ORAL_TABLET | Freq: Every day | ORAL | Status: DC

## 2021-05-19 MED ORDER — DOCUSATE SODIUM 100 MG PO CAPS: 100.0000 mg | ORAL_CAPSULE | Freq: Every day | ORAL | 0 refills | Status: DC

## 2021-05-19 MED ORDER — PANTOPRAZOLE SODIUM 40 MG PO TBEC: 40.0000 mg | DELAYED_RELEASE_TABLET | Freq: Every day | ORAL | Status: DC

## 2021-05-19 MED ORDER — CEFAZOLIN SODIUM-DEXTROSE 1-4 GM/50ML-% IV SOLN: 1.0000 g | INTRAVENOUS | Status: DC

## 2021-05-19 NOTE — Progress Notes (Signed)
Physical Therapy Session Note  Patient Details  Name: Todd Duran MRN: [id number] Date of Birth: Nov 26, 1945  Today's Date: 05/19/2021 PT Individual Time: 0800-0855 PT Individual Time Calculation (min): 55 min   Short Term Goals: Week 1:  PT Short Term Goal 1 (Week 1): Patient with perform basic transfers with min A using LRAD. PT Short Term Goal 2 (Week 1): Patient will initiate w/c mobility >100 ft PT Short Term Goal 3 (Week 1): Patient with perform standing activities with mod A >5 min.  Skilled Therapeutic Interventions/Progress Updates:     Patient in bed with RN changing L residual limb dressing upon PT arrival. Patient alert and agreeable to PT session. Patient reported 6-8/10 L residual limb pain during session, RN made aware. PT provided repositioning, rest breaks, and distraction as pain interventions throughout session.   On observation with RN noted progression of dark and boggy area at lateral incision, progressing behind his leg up to the popliteal fossa, and foul odor coming from the wound. RN reports that the dressing has needed changed x3 this morning due to drainage from the wound.   RN applied dressing and PT applied ACE wrap and donned limb guard with total A at beginning of session.   MD rounded during session and discussed pending surgery for AKA. MD made aware of dressing changes and observations of patient's residual limb.   Therapeutic Activity: Bed Mobility: Patient performed rolling R/L to don shorts with total A. Donned R thigh high TED hose and non-skid sock with total a bed level for energy/time management. He performed supine to/from sit with min a for trunk and L residual limb management. Provided verbal cues for rolling to side-lying to push up to sitting. Transfers: Patient performed a slide board transfer bed<>w/c with min A and total A for board placement. Provided cues for hand placement, board placement, and head-hips relationship for proper technique  and decreased assist with transfers.  Patient performed sit to/from stand x1 in the // bars with max A due to patient with strong L weight shift. Provided verbal cues for shifting forward and R over his well limb without patient responding to cues.  Patient minimally verbal and with intermittent eyes closed after attempt to stand.  BP: 77/47, HR 95 Returned patient to his room and the bed, as above. Provided quiet rest in supine x2 min. BP 127/94, HR 102 RN, MD, and therapy team made aware.  Patient in bed at end of session with breaks locked, bed alarm set, and all needs within reach.   Therapy Documentation Precautions:  Precautions Precautions: Fall Precaution Comments: L- BKA Required Braces or Orthoses: Other Brace Other Brace: shrinker, limb guard Restrictions Weight Bearing Restrictions: Yes LLE Weight Bearing: Non weight bearing    Therapy/Group: Individual Therapy  Jaclyne Haverstick L Marc Sivertsen PT, DPT  05/19/2021, 12:37 PM

## 2021-05-19 NOTE — Discharge Summary (Signed)
Physician Discharge Summary  Patient ID: Todd Duran MRN: [id number] DOB/AGE: 06/08/1946 75 y.o.  Admit date: 05/15/2021 Discharge date: 05/20/2021  Discharge Diagnoses:  Principal Problem:   Left below-knee amputee [facility]) Active Problems:   PAD (peripheral artery disease) (HCC) Pain management Acute blood loss anemia Hypertension Diabetes mellitus Hyperlipidemia UTI Tobacco and alcohol use  Discharged Condition: Guarded  Significant Diagnostic Studies: PERIPHERAL VASCULAR CATHETERIZATION  Result Date: 05/12/2021 Images from the original result were not included. Patient name: Todd Duran MRN: [id number] DOB: [birth year] Sex: male 05/12/2021 Pre-operative Diagnosis: Ischemic left foot Post-operative diagnosis:  Same Surgeon:  [name] Procedure Performed:  1.  Ultrasound-guided access, left common femoral artery  2.  Abdominal aortogram  3.  Bilateral lower extremity runoff  4.  Conscious sedation, 50 minutes Indications: This is a 75 year old gentleman with ischemic left foot and severe disease on the right who comes in today for angiographic evaluation Procedure:  The patient was identified in the holding area and taken to room 8.  The patient was then placed supine on the table and prepped and draped in the usual sterile fashion.  A time out was called.  Conscious sedation was administered with the use of IV fentanyl and Versed under continuous physician and nurse monitoring.  Heart rate, blood pressure, and oxygen saturation were continuously monitored.  Total sedation time was 50 minutes.  Ultrasound was used to evaluate the left common femoral artery.  It was patent .  A digital ultrasound image was acquired.  A micropuncture needle was used to access the left common femoral artery under ultrasound guidance.  An 018 wire was advanced without resistance and a micropuncture sheath was placed.  The 018 wire was removed and a benson wire was placed.  The micropuncture sheath was  exchanged for a 5 french sheath.  An omniflush catheter was advanced over the wire to the level of L-1.  An abdominal angiogram was obtained.  Next, the catheter was pulled down to the aortic bifurcation and bilateral runoff was performed Findings:  Aortogram: Bilateral renal artery stenosis.  The infrarenal abdominal aorta is calcified but patent throughout its course.  Moderate stenosis is visualized at the origin of the left common iliac artery.  The external iliac artery is calcified but patent throughout its course.  There is a 90% right iliac stenosis  Right Lower Extremity: There is a subtotal occlusion with heavy calcification within the right common femoral artery.  The profundofemoral artery is patent throughout its course.  The superficial femoral artery is also patent however there is severe stenosis within the adductor canal.  The popliteal artery is patent however there is limited visualization of the tibial vessels secondary to proximal disease.  There does appear to be two-vessel runoff via the posterior tibial and peroneal artery.  Left Lower Extremity: The left common femoral artery is occluded.  There is reconstitution of the profundofemoral artery.  The superficial femoral artery is patent down the adductor canal where it occludes.  There is reconstitution of the above-knee popliteal artery with three-vessel runoff via very diseased vessels. Intervention: None Impression:  #1  Left common femoral artery occlusion.  Left superficial femoral artery occlusion at the adductor canal with popliteal reconstitution and three-vessel runoff  #2  Right iliac stenosis, 90%  #3  Severe right common femoral stenosis along with severe right superficial femoral artery stenosis  #4  The patient will require left below-knee amputation.  At a later date he will be considered for right  femoral endarterectomy and iliac stenting, with possible superficial femoral artery stenting.  Juleen ChinaV. Wells Brabham, M.D., FACS [facility] [place] Office: [phone] Pager:  [phone]   VAS US ABI WITH/WO TBI  Result Date: 05/08/2021  LOWER EXTREMITY DOPPLER STUDY Patient Name:  Todd Duran  Date of Exam:   05/08/2021 Medical Rec #: [id number]        Accession #:    [id number] Date of Birth: 09/03/1946        Patient Gender: M Patient Age:   74Y [facility]am [facility] Procedure:      VAS US ABI WITH/WO TBI Referring Phys: --------------------------------------------------------------------------------  Indications: Ulceration, and gangrene involving the left foot and toes.  Performing Technologist: Dorthula MatasErica Mcgonigal RVS, RCS  Examination Guidelines: A complete evaluation includes at minimum, Doppler waveform signals and systolic blood pressure reading at the level of bilateral brachial, anterior tibial, and posterior tibial arteries, when vessel segments are accessible. Bilateral testing is considered an integral part of a complete examination. Photoelectric Plethysmograph (PPG) waveforms and toe systolic pressure readings are included as required and additional duplex testing as needed. Limited examinations for reoccurring indications may be performed as noted.  ABI Findings: +---------+------------------+-----+--------+--------+ Right    Rt Pressure (mmHg)IndexWaveformComment  +---------+------------------+-----+--------+--------+ Brachial 140                                     +---------+------------------+-----+--------+--------+ PTA                             absent           +---------+------------------+-----+--------+--------+ DP                              absent           +---------+------------------+-----+--------+--------+ Great Toe                       Absent           +---------+------------------+-----+--------+--------+ +---------+------------------+-----+--------+-----------------------------+ Left     Lt Pressure (mmHg)IndexWaveformComment                        +---------+------------------+-----+--------+-----------------------------+ Brachial 144                                                          +---------+------------------+-----+--------+-----------------------------+ PTA                             absent                                +---------+------------------+-----+--------+-----------------------------+ DP                              absent                                +---------+------------------+-----+--------+-----------------------------+ Great Toe  unable to obtain due to ulcer +---------+------------------+-----+--------+-----------------------------+  Summary: Right: Absent pedal pulses. Left: Absent pedal pulses.  *See table(s) above for measurements and observations.  Electronically signed by Lemar Livings MD on 05/08/2021 at 4:42:13 PM.    Final    DG HIP UNILAT WITH PELVIS 2-3 VIEWS LEFT  Result Date: 05/15/2021 CLINICAL DATA:  Recent fall with left hip pain, initial encounter EXAM: DG HIP (WITH OR WITHOUT PELVIS) 3V LEFT COMPARISON:  MRI from 04/13/2021 FINDINGS: Proximal femur appears within normal limits. Mild degenerative changes of the hip joint are seen. Pelvic ring as visualized is intact. No gross soft tissue abnormality is seen. Diffuse vascular calcifications are noted. IMPRESSION: Degenerative changes without acute abnormality. Electronically Signed   By: Alcide Clever M.D.   On: 05/15/2021 21:24    Labs:  Basic Metabolic Panel: Recent Labs  Lab 05/14/21 0107 05/14/21 1612 05/15/21 0147 05/17/21 0506 05/18/21 0712  NA 130* 131* 128* 128* 128*  K 4.0 4.5 5.0 4.1 3.8  CL 96* 96* 92* 92* 92*  CO2 27 25 27 24 23   GLUCOSE 116* 153* 127* 109* 138*  BUN 21 17 14 22  34*  CREATININE 0.83 0.90 0.82 1.03 1.31*  CALCIUM 9.6 10.0 10.4* 9.8 9.8  MG  --  1.5*  --  2.2  --   PHOS  --  3.5  --   --   --     CBC: Recent Labs  Lab 05/15/21 0147  05/18/21 0712 05/18/21 1230 05/19/21 0616  WBC 8.3 22.1*  --  23.4*  NEUTROABS  --  19.9*  --   --   HGB 8.5* 6.7* 6.7* 8.8*  HCT 26.9* 20.9* 20.9* 25.6*  MCV 88.8 86.7  --  81.8  PLT 462* 478*  --  450*    CBG: Recent Labs  Lab 05/18/21 2049 05/19/21 0558 05/19/21 1137 05/19/21 1627 05/19/21 2105  GLUCAP 163* 109* 118* 104* 89    Brief HPI:   DONALDSON RICHTER is a 75 y.o. right-handed male with history of hypertension as well as tobacco alcohol use.  Lives with spouse independent prior to admission.  Spouse works during the day.  Presented 05/13/2021 with progressive left foot pain x2 months with ischemic changes and findings of decreased femoral pulses bilaterally.  Underwent ultrasound-guided left femoral artery with abdominal aortogram showing bilateral renal artery stenosis.  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  Limb was not felt to be salvageable.  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 07/14/2021.  Hospital course anemia 8.5 as well as hyponatremia 130.  Cleared to begin heparin for DVT prophylaxis.  Therapy evaluations completed due to patient decreased functional mobility was admitted for a comprehensive rehab program.   Hospital Course: KASE SHUGHART was admitted to rehab 05/15/2021 for inpatient therapies to consist of PT, ST and OT at least three hours five days a week. Past admission physiatrist, therapy team and rehab RN have worked together to provide customized collaborative inpatient rehab.  Pertaining to patient's left BKA 05/14/2019 to monitor closely limb guard had been in place.  Noted boggy necrotic area concerning along incision line with vascular surgery notified with follow-up.  A few of patient's staples were removed noted underlying abscess with poor healing and noted increased necrosis.  Recommendations were made for conversion to AKA and patient was discharged to acute care services.  Patient while attending rehab therapies pain management ongoing  Neurontin adjusted.  Acute blood loss anemia 6.7 he was transfused 2 units  of packed red blood cells 05/18/2021.  No chest pain or shortness of breath.  Blood pressure controlled on Norvasc as well as Tenormin.  Hemoglobin A1c 5.2 maintained on Glucophage.  Patient did have a history of tobacco alcohol use receiving counsel regards to cessation of these products.  Lipitor for hyperlipidemia.  Noted UTI started on ceftriaxone he had initially refused oral Keflex urine culture came back negative antibiotics discontinued.   Blood pressures were monitored on TID basis and soft and monitored  Diabetes has been monitored with ac/hs CBG checks and SSI was use prn for tighter BS control.    Rehab course: During patient's stay in rehab weekly team conferences were held to monitor patient's progress, set goals and discuss barriers to discharge. At admission, patient required moderate assist for feet rolling walker moderate assist sit to stand mod assist lower body bathing set up upper body dressing moderate assist lower body dressing moderate assist toilet transfers  Physical exam.  Blood pressure 147/84 pulse 94 temperature 98 respirations 18 oxygen saturation 98% room air Constitutional.  No acute distress HEENT.  Noted missing teeth Eyes.  Pupils round and reactive to light no discharge without nystagmus Neck.  Supple nontender no JVD without thyromegaly Cardiac regular rate rhythm any extra sounds or murmur heard Abdomen.  Soft nontender positive bowel sounds without rebound Respiratory effort normal no respiratory distress without wheeze Skin.  Left BKA limb guard in place.  Noted boggy dark ischemic necrotic areas to BKA site. Neurologic.  Alert oriented to person and place upper extremities 5/5 right lower extremity 4/5 proximal to distal.  Able to lift left leg off bed.  He/She  has had improvement in activity tolerance, balance, postural control as well as ability to compensate for deficits.  He/She has had improvement in functional use RUE/LUE  and RLE/LLE as well as improvement in awareness.  Patient required moderate assist to transition to sit edge of bed and trained in sliding board functional transfers requiring moderate assist and max cueing for proper hand placement and sequencing with poor carryover.  He did require rest breaks due to fatigue.  Instructed in stand pivot transfers back to bed requiring total assist.       Disposition: Discharged to acute care services for AKA    Diet: Diabetic diet  Special Instructions: No smoking or alcohol  Medications at time of discharge to acute care services 1.  Tylenol as needed 2.  Norvasc 10 mg p.o. daily 3.  Aspirin 81 mg p.o. daily 4.  Tenormin 25 mg p.o. twice daily 5.  Lipitor 20 mg p.o. nightly 6.  Colace 100 mg p.o. daily 7.  Folic acid 1 mg p.o. daily 8.  Subcutaneous heparin 5000 units every 8 hours 9.  Remeron 7.5 mg p.o. nightly 10.  Multivitamin daily 11.  Nicorette gum as needed 12.  Ditropan 2.5 mg p.o. twice daily 13.  Protonix 40 mg p.o. daily 14.  Senokot S1 tablet p.o. twice daily  30-35 minutes were spent completing discharge summary and discharge planning     Follow-up Information     Ranelle Oyster, MD Follow up.   Specialty: Physical Medicine and Rehabilitation Why: Office to call for appointment Contact information: [address] [place] [address] [phone]         Nada Libman, MD Follow up.   Specialties: Vascular Surgery, Cardiology Why: Call for appointment Contact information: [address] [place] [address] [phone]  Signed: Mcarthur Rossetti Rashel Okeefe 05/20/2021, 5:16 AM

## 2021-05-19 NOTE — Patient Care Conference (Signed)
Inpatient RehabilitationTeam Conference and Plan of Care Update Date: 05/19/2021   Time: 10:26 AM    Patient Name: Todd Duran      Medical Record Number: [id number]  Date of Birth: Sep 07, 1946 Sex: Male         Room/Bed: 5C04C/5C04C-01 Payor Info: Payor: [employer] / Plan: [facility] / Product Type: *No Product type* /    Admit Date/Time:  05/15/2021  4:46 PM  Primary Diagnosis:  Left below-knee amputee [facility])  Hospital Problems: Principal Problem:   Left below-knee amputee [facility]) Active Problems:   PAD (peripheral artery disease) [facility])    Expected Discharge Date: Expected Discharge Date: 05/20/21  Team Members Present: Physician leading conference: Dr. Faith Rogue Care Coodinator Present: Cecile Sheerer, LCSWA;Jessen Siegman Marlyne Beards, RN, BSN, CRRN Nurse Present: Kennyth Arnold, RN PT Present: Serina Cowper, PT OT Present: Jake Shark, OT PPS Coordinator present : Fae Pippin, SLP     Current Status/Progress Goal Weekly Team Focus  Bowel/Bladder   incontinent to continent to bowel and required  I & O cath for bladder due to UTI and retention. last BM 05/17/21  Patient will be able to void own.  bowel and bladder training with continue assessment.   Swallow/Nutrition/ Hydration             ADL's   mod-max A transfers depending on alertness, poor participation d/t fluctuating arousal  supervision overall  OOB tolerance pending medical stability, transfers, ADLs, limb loss education   Mobility   min A bed mobility, mod A SBT, limted by agitation which has improved, continues to have intermittent confusion, and pain/fatigue  CGA-supervision overall  Activity tolerance, strengthening/ROM, functional mobility, w/c mobility, transfer training, sitting and standing tolerance, gait training when able, ampuetee education, patient/caregiver education   Communication             Safety/Cognition/ Behavioral Observations            Pain    incision L BKA + generalize coronic pain, managing with schedule tylenol.  manage pain  Assess Q shift and PRN   Skin   L BKA- incision steples on, dry and intact, no other opening skin  skin remain intact  Assess skin and incision site Q shift and PRN     Discharge Planning:  D/c to home with his wife who will provide 24/7 care.   Team Discussion: Plan is to return to surgery tomorrow and have BKA revision to AKA. Had blood transfusion yesterday. Incontinent B/B. Pain to Left BKA. New drainage from amputation site. Patient on target to meet rehab goals: Mod/max assist for transfers, has supervision goals. Got OOB today. Min assist with a slide board.  *See Care Plan and progress notes for long and short-term goals.   Revisions to Treatment Plan:  DC to surgery tomorrow. Teaching Needs: Complete at this time, discharging to surgery tomorrow.  Current Barriers to Discharge: Decreased caregiver support, Medical stability, Home enviroment access/layout, Incontinence, Wound care, Lack of/limited family support, Weight, Weight bearing restrictions, Medication compliance, Pending surgery, and Behavior  Possible Resolutions to Barriers: Continue current medications, provide emotional support.     Medical Summary Current Status: left bka, necrotic area near incision which is non-healing--will need AKA tomorrow. transfused 2u blood for anemia. pain control  Barriers to Discharge: Medical stability;Wound care   Possible Resolutions to Barriers/Weekly Focus: will need dc to surgery for AKA revision.   Continued Need for Acute Rehabilitation Level of Care: The patient requires daily medical management by  a physician with specialized training in physical medicine and rehabilitation for the following reasons: Direction of a multidisciplinary physical rehabilitation program to maximize functional independence : Yes Medical management of patient stability for increased activity during  participation in an intensive rehabilitation regime.: Yes Analysis of laboratory values and/or radiology reports with any subsequent need for medication adjustment and/or medical intervention. : Yes   I attest that I was present, lead the team conference, and concur with the assessment and plan of the team.   Tennis Must 05/19/2021, 4:08 PM

## 2021-05-19 NOTE — Progress Notes (Signed)
Occupational Therapy Session Note  Patient Details  Name: Todd Duran MRN: [id number] Date of Birth: 16-Nov-1945  Today's Date: 05/19/2021 OT Individual Time: 1300-1318 OT Individual Time Calculation (min): 18 min  and Today's Date: 05/19/2021 OT Missed Time: 40 Minutes Missed Time Reason: Patient fatigue   Short Term Goals: Week 1:  OT Short Term Goal 1 (Week 1): Pt will complete LB dressing with min A and use of AE, as needed. OT Short Term Goal 2 (Week 1): Pt will complete toileting task with min A. OT Short Term Goal 3 (Week 1): Pt will complete functional transfers (toilet, shower) with min A. OT Short Term Goal 4 (Week 1): To promote UB strength and functional endurance of ADLs, pt will engage in UB strengthening x5 min without rest break.  Skilled Therapeutic Interventions/Progress Updates:   Pt received sleeping sideways in bed, awoken with verbal and tactile cues. His lunch was present and untouched- encouraged pt to eat especially with sx tomorrow. Pt stating "which leg are they doing sx on?". Bed adjusted to allow pt to scoot up in bed, which he required min A to do so. Pt possibly hallucinating- seeing a bug on his food tray that OT could not find/see. Pt was sat up in bed and required intermittent cueing to maintain arousal safe enough to eat, as he frequently fell asleep. He also required set up of lunch tray as he was unable to open bag of chips bimanually. Pt fell asleep and was unable to be alert enough again to eat safely. BP assessed 114/70. His lunch tray was removed and he was positioned comfortably in bed. Bed alarm set, all needs met.   40 min missed d/t fatigue.   Therapy Documentation Precautions:  Precautions Precautions: Fall Precaution Comments: L- BKA Required Braces or Orthoses: Other Brace Other Brace: shrinker, limb guard Restrictions Weight Bearing Restrictions: Yes LLE Weight Bearing: Non weight bearing    Therapy/Group: Individual  Therapy  Curtis Sites 05/19/2021, 6:12 AM

## 2021-05-19 NOTE — Progress Notes (Signed)
Inpatient Rehabilitation Care Coordinator Assessment and Plan Patient Details  Name: Todd Duran MRN: [id number] Date of Birth: 05-10-1946  Today's Date: 05/19/2021  Hospital Problems: Principal Problem:   Left below-knee amputee [facility]) Active Problems:   PAD (peripheral artery disease) ([place])  Past Medical History:  Past Medical History:  Diagnosis Date   Diabetes mellitus without complication ([place])    Hypertension    Past Surgical History:  Past Surgical History:  Procedure Laterality Date   ABDOMINAL AORTOGRAM W/LOWER EXTREMITY N/A 05/12/2021   Procedure: ABDOMINAL AORTOGRAM W/LOWER EXTREMITY;  Surgeon: Serafina Mitchell, MD;  Location: [place] [facility];  Service: Cardiovascular;  Laterality: N/A;   AMPUTATION Left 05/13/2021   Procedure: AMPUTATION BELOW KNEE LEFT ;  Surgeon: Serafina Mitchell, MD;  Location: [facility] OR;  Service: Vascular;  Laterality: Left;   HERNIA REPAIR     KNEE SURGERY     Social History:  reports that he has been smoking cigarettes. He has been smoking an average of 0.50 packs per day. He has never used smokeless tobacco. No history on file for alcohol use and drug use.  Family / Support Systems Marital Status: Married Patient Roles: Spouse Spouse/Significant Other: Todd Duran (wife): [phone] Children: 1 son that lives in [place] Other Supports: None reported Anticipated Caregiver: Wife Ability/Limitations of Caregiver: None reported Caregiver Availability: 24/7 Family Dynamics: Pt lives with wife who will provide 24/7 care  Social History Preferred language: English Religion:  Cultural Background: Pt unable to answer SW question Education: some college Read: Yes Write: Yes Employment Status: Retired Public relations account executive Issues: Denies Guardian/Conservator: N/A   Abuse/Neglect Abuse/Neglect Assessment Can Be Completed: Yes (pt was confused and lethargic during session. SW confirmed with pt assigned RN Clifton James that pt was recently  given pain medications) Physical Abuse: Denies Verbal Abuse: Denies Sexual Abuse: Denies Exploitation of patient/patient's resources: Denies Self-Neglect: Denies  Emotional Status Pt's affect, behavior and adjustment status: Pt pleasant during session and lethargic Recent Psychosocial Issues: Denies Psychiatric History: Denies Substance Abuse History: Pt reports that he quit cigarettes a few years ago (atleast 3 yrs), and states EtOH depends on the day and was very vague with response. Denies rec drug use.  Patient / Family Perceptions, Expectations & Goals Pt/Family understanding of illness & functional limitations: Pt wife has a general understanding of care needs Premorbid pt/family roles/activities: Independent Anticipated changes in roles/activities/participation: Assistance with ADLs/IADLs Pt/family expectations/goals: When asked what his goal was, pt stated "I hadn't thought about it." Pt wife goal is for him to get "help with transfers and standing."  [employer]: None Premorbid Home Care/DME Agencies: None Transportation available at discharge: Wife Resource referrals recommended: Neuropsychology  Discharge Planning Living Arrangements: Spouse/significant other Support Systems: Spouse/significant other Type of Residence: Private residence Administrator, sports: Multimedia programmer (specify) ([place]) Museum/gallery curator Resources: Radio broadcast assistant Screen Referred: No Living Expenses: Medical laboratory scientific officer Management: Spouse Does the patient have any problems obtaining your medications?: No Home Management: Pt reports that he and wife both manage household duties such as cooking and Merchant navy officer Preliminary Plans: TBD Care Coordinator Barriers to Discharge: Decreased caregiver support, Lack of/limited family support Care Coordinator Anticipated Follow Up Needs: HH/OP Expected length of stay: 10-14 days  Clinical  Impression SW met with pt in room to introduce self, explain role, and discuss discharge process. Pt very lethargic during session. 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 Pt admits to DUI in past. No DME. Pt aware SW to follow-up  with his wife.   25- SW made efforts to make contact with pt wife Todd Duran [phone]) but vm full.   23- SW spoke with pt wife Todd Duran [phone]) to introduce self, explain role, and discuss discharge process. Pt wife confirms that she is primary caregiver, and helped with clarifying information during assessment. She is aware SW will f/u after team conference with updates.   SW spoke with [place] [phone]) confirming we will f/u with her about pt care needs. Pt goes to [facility] in [place], however, Contractor for earliest appt was [place], [place]. States pt PCP- Dr. Tobey Grim.  Rashanda Magloire A Fleming Prill 05/19/2021, 9:19 AM

## 2021-05-19 NOTE — Progress Notes (Signed)
Patient's wife signed consents needed for surgical procedures. All questions answered. Wife also mentioned that she spoke with surgeon regarding surgical procedure occurring tomorrow at 1500. Patient's vital signs remain stable. No c/o pain or discomfort at this time. Will continue to monitor.

## 2021-05-19 NOTE — Progress Notes (Addendum)
Patient ID: Todd Duran, male   DOB: Aug 26, 1946, 75 y.o.   MRN: [id number]  Per medical team, pt will d/c tomorrow for L BKA revision.   Cecile Sheerer, MSW, LCSWA Office: [phone] Cell: [phone] Fax: [phone]

## 2021-05-19 NOTE — Progress Notes (Signed)
Occupational Therapy Session Note  Patient Details  Name: Todd Duran MRN: [id number] Date of Birth: [birth year]  Today's Date: 05/19/2021 OT Individual Time: 1017-5102 OT Individual Time Calculation (min): 35 min  and Today's Date: 05/19/2021 OT Missed Time: 25 Minutes Missed Time Reason: Patient fatigue   Short Term Goals: Week 1:  OT Short Term Goal 1 (Week 1): Pt will complete LB dressing with min A and use of AE, as needed. OT Short Term Goal 2 (Week 1): Pt will complete toileting task with min A. OT Short Term Goal 3 (Week 1): Pt will complete functional transfers (toilet, shower) with min A. OT Short Term Goal 4 (Week 1): To promote UB strength and functional endurance of ADLs, pt will engage in UB strengthening x5 min without rest break.  Skilled Therapeutic Interventions/Progress Updates Pt received supine in bed lethargic but agreeable to OT session. Pt disoriented unable to state location or year even with choices provided. Vitals taken throughout session, did endorse feeling  "heavy headed when I ate those pills." Pt currently requires MINA for bed mobility and MOD A for SB transfers with pt transferring to R side. Once pt back in supine pt able to complete x10 reps of straight leg raises with LLE. Pt left supine in bed with all needs within reach and bed alarm activated.   supine 130/75 ( 90) HR 99 Sitting- 100/66 (77) HR 97 BP from w/c 95/61 (71) HR 91 BP from supine 107/55 (72) HR 91 supine ( after doing light therex) 120/74 ( 88) HR 89  Therapy Documentation Precautions:  Precautions Precautions: Fall Precaution Comments: L- BKA Required Braces or Orthoses: Other Brace Other Brace: shrinker, limb guard Restrictions Weight Bearing Restrictions: Yes LLE Weight Bearing: Non weight bearing General: General OT Amount of Missed Time: 25 Minutes Vital Signs:   Pain: Pt reports pain 8-9/10 in BLEs with pt premedicated prior to session, offered rest breaks and  repositioning as pain mgmt strategy.    Therapy/Group: Individual Therapy  [name] 05/19/2021, 12:33 PM

## 2021-05-19 NOTE — Progress Notes (Signed)
PROGRESS NOTE   Subjective/Complaints: Pt up with PT. A little more subdued, confused today. Denies new pain. Became orthostatic in PT  ROS: Limited due to cognitive/behavioral .    Objective:   No results found. Recent Labs    05/18/21 0712 05/18/21 1230 05/19/21 0616  WBC 22.1*  --  23.4*  HGB 6.7* 6.7* 8.8*  HCT 20.9* 20.9* 25.6*  PLT 478*  --  450*   Recent Labs    05/17/21 0506 05/18/21 0712  NA 128* 128*  K 4.1 3.8  CL 92* 92*  CO2 24 23  GLUCOSE 109* 138*  BUN 22 34*  CREATININE 1.03 1.31*  CALCIUM 9.8 9.8    Intake/Output Summary (Last 24 hours) at 05/19/2021 1153 Last data filed at 05/19/2021 0545 Gross per 24 hour  Intake 1538 ml  Output 1450 ml  Net 88 ml        Physical Exam: Vital Signs Blood pressure 118/71, pulse 88, temperature 98.1 F (36.7 C), temperature source Oral, resp. rate 16, height 5\' 11"  (1.803 m), weight 55.7 kg, SpO2 100 %. Constitutional: No distress . Vital signs reviewed. HEENT: EOMI, oral membranes moist Neck: supple Cardiovascular: RRR without murmur. No JVD    Respiratory/Chest: CTA Bilaterally without wheezes or rales. Normal effort    GI/Abdomen: BS +, non-tender, non-distended Ext: no clubbing, cyanosis, or edema Psych: flat and disengaged Musculoskeletal:    Cervical back: Normal range of motion.    Comments: Left residual limb tender, Skin:    General: Skin is warm.    Comments:    Staples in place, large area along incision which is boggy, +odor, ischemic appearing Neurological:    Comments: A little more slowed, delayed today.   Oriented to person and follows commands with extra time. UE 5/5. RLE 4/5 prox to distal. Able to lift left leg off chair. No focal sensory findings.      Assessment/Plan: 1. Functional deficits which require 3+ hours per day of interdisciplinary therapy in a comprehensive inpatient rehab setting. Physiatrist is providing close  team supervision and 24 hour management of active medical problems listed below. Physiatrist and rehab team continue to assess barriers to discharge/monitor patient progress toward functional and medical goals  Care Tool:  Bathing    Body parts bathed by patient: Left arm, Chest, Abdomen, Face   Body parts bathed by helper: Right arm, Right lower leg, Left upper leg Body parts n/a: Left lower leg   Bathing assist Assist Level: Moderate Assistance - Patient 50 - 74%     Upper Body Dressing/Undressing Upper body dressing   What is the patient wearing?: Pull over shirt    Upper body assist Assist Level: Minimal Assistance - Patient > 75%    Lower Body Dressing/Undressing Lower body dressing      What is the patient wearing?: Pants     Lower body assist Assist for lower body dressing: 2 Helpers     Toileting Toileting Toileting Activity did not occur (Clothing management and hygiene only): N/A (no void or bm)  Toileting assist Assist for toileting: Dependent - Patient 0%     Transfers Chair/bed transfer  Transfers assist  Chair/bed transfer  activity did not occur: Safety/medical concerns (patient with symptoms of OH)        Locomotion Ambulation   Ambulation assist   Ambulation activity did not occur: Safety/medical concerns          Walk 10 feet activity   Assist  Walk 10 feet activity did not occur: Safety/medical concerns        Walk 50 feet activity   Assist Walk 50 feet with 2 turns activity did not occur: Safety/medical concerns         Walk 150 feet activity   Assist Walk 150 feet activity did not occur: Safety/medical concerns         Walk 10 feet on uneven surface  activity   Assist Walk 10 feet on uneven surfaces activity did not occur: Safety/medical concerns         Wheelchair     Assist Will patient use wheelchair at discharge?: Yes (Per PT long term goals) Type of Wheelchair: Manual Wheelchair activity did  not occur: Safety/medical concerns         Wheelchair 50 feet with 2 turns activity    Assist    Wheelchair 50 feet with 2 turns activity did not occur: Safety/medical concerns       Wheelchair 150 feet activity     Assist  Wheelchair 150 feet activity did not occur: Safety/medical concerns       Blood pressure 118/71, pulse 88, temperature 98.1 F (36.7 C), temperature source Oral, resp. rate 16, height 5\' 11"  (1.803 m), weight 55.7 kg, SpO2 100 %.  Medical Problem List and Plan: 1. Debility secondary to left BKA 05/13/2021             -patient may shower if incision is covered             -ELOS/Goals: to OR tomorrow for revision to AKA.   2.  Impaired mobility: Continue Subcutaneous heparin             -antiplatelet therapy: Aspirin 81 mg daily 3. Residual limb/phantom pain: Schedule Tylenol 650mg  q8H.             - phantom limb pain LLE. Continue low dose gabapentin 100mg  qhs                         -  limb massage and visual feedback 4. Mood: Remeron added for insomnia, appetite 7.5mg  qhs             -antipsychotic agents: N/A 5. Neuropsych: This patient is capable of making decisions on own his behalf. 6. Skin/Wound Care: continue shrinker over limb for healing, edema control             -limb guard for protection, promotion of knee extension  7/12 -boggy/necrotic area concerning along/above incision. WBC's up to 23k. Dr. to take to OR tomorrow for revision to AKA -continue local care,   7. Fluids/Electrolytes/Nutrition: encourage PO fluids  I personally reviewed the patient's labs today.  BUN/Cr elevated  -check BMET, CBC tomorrow 8.  Acute blood loss anemia. 7/11 Follow-up hgb 6.7 today--f/u lab was the same   -transfused 2u PRBC 7/11--up to 8.8 7/12 9.  Hypertension.  Norvasc 10 mg daily, Tenormin 25 mg twice daily.  Monitor with increased mobility             -bp controlled 7/12, stopped hctz given low na+ 10.  Diabetes mellitus.  Hemoglobin A1c  5.2.  SSI.  Patient on Glucophage 500 mg daily prior to admission.   -monitor CBG's and resume as needed -7/11 fair control at present 11.  Hyperlipidemia.  Lipitor 12.  Tobacco/alcohol use.  Provide counseling 13. Hypomagnesemia: resolved with supplementation.  14. Hyponatremia: 131 to 128, stable at 128 on 7/10 and 7/11  -stop hctz--see above 15. UTI: start ceftriaxone since refused oral keflex--UC negative--stopped abx 7/11 16. Confusion: d/c oxycodone. Definitely improving.       LOS: 4 days A FACE TO FACE EVALUATION WAS PERFORMED  Ranelle Oyster 05/19/2021, 11:53 AM

## 2021-05-19 NOTE — Progress Notes (Signed)
[facility]  Subjective  -no complaints   Objective 118/71 88 98.1 F (36.7 C) (Oral) 16 100%  Intake/Output Summary (Last 24 hours) at 05/19/2021 0612 Last data filed at 05/19/2021 0545 Gross per 24 hour  Intake 1658 ml  Output 1450 ml  Net 208 ml    Left BKA frankly necrotic and foul-smelling  Laboratory Lab Results: Recent Labs    05/18/21 0712 05/18/21 1230  WBC 22.1*  --   HGB 6.7* 6.7*  HCT 20.9* 20.9*  PLT 478*  --    BMET Recent Labs    05/17/21 0506 05/18/21 0712  NA 128* 128*  K 4.1 3.8  CL 92* 92*  CO2 24 23  GLUCOSE 109* 138*  BUN 22 34*  CREATININE 1.03 1.31*  CALCIUM 9.8 9.8    COAG No results found for: INR, PROTIME No results found for: PTT  Assessment/Planning:  75 year old male status post left below-knee amputation by Dr. Myra Gianotti.  Vascular surgery was called last night to reevaluate the stump.  I did remove some staples hoping to drain an underlying abscess but really did not drain any significant fluid and what I could see of the wound appears frankly necrotic now.  I think he needs conversion to above-knee amputation and I do not think he has any chance of healing.  I will tentatively posted for tomorrow Wednesday.  Please keep n.p.o. after midnight.  Cephus Shelling 05/19/2021 6:12 AM --

## 2021-05-20 ENCOUNTER — Encounter (HOSPITAL_COMMUNITY)
Admission: RE | Disposition: A | Discharge status: Rehabilitation Facility | Payer: Self-pay | Source: Home / Self Care | Attending: Vascular Surgery

## 2021-05-20 ENCOUNTER — Inpatient Hospital Stay (HOSPITAL_COMMUNITY)
Admission: RE | Admit: 2021-05-20 | Discharge: 2021-05-25 | DRG: 475 | Disposition: A | Discharge status: Rehabilitation Facility | Payer: No Typology Code available for payment source | Attending: Vascular Surgery | Admitting: Vascular Surgery

## 2021-05-20 ENCOUNTER — Encounter (HOSPITAL_COMMUNITY): Payer: Self-pay | Admitting: Vascular Surgery

## 2021-05-20 ENCOUNTER — Inpatient Hospital Stay (HOSPITAL_COMMUNITY): Payer: No Typology Code available for payment source | Admitting: Certified Registered"

## 2021-05-20 DIAGNOSIS — E785 Hyperlipidemia, unspecified: Secondary | ICD-10-CM | POA: Diagnosis present

## 2021-05-20 DIAGNOSIS — Z4781 Encounter for orthopedic aftercare following surgical amputation: Secondary | ICD-10-CM | POA: Diagnosis not present

## 2021-05-20 DIAGNOSIS — Y835 Amputation of limb(s) as the cause of abnormal reaction of the patient, or of later complication, without mention of misadventure at the time of the procedure: Secondary | ICD-10-CM | POA: Diagnosis present

## 2021-05-20 DIAGNOSIS — Z888 Allergy status to other drugs, medicaments and biological substances status: Secondary | ICD-10-CM | POA: Diagnosis not present

## 2021-05-20 DIAGNOSIS — I10 Essential (primary) hypertension: Secondary | ICD-10-CM | POA: Diagnosis present

## 2021-05-20 DIAGNOSIS — Z89512 Acquired absence of left leg below knee: Secondary | ICD-10-CM | POA: Diagnosis not present

## 2021-05-20 DIAGNOSIS — M62271 Nontraumatic ischemic infarction of muscle, right ankle and foot: Secondary | ICD-10-CM | POA: Diagnosis not present

## 2021-05-20 DIAGNOSIS — D72829 Elevated white blood cell count, unspecified: Secondary | ICD-10-CM | POA: Diagnosis present

## 2021-05-20 DIAGNOSIS — Z89612 Acquired absence of left leg above knee: Secondary | ICD-10-CM | POA: Diagnosis not present

## 2021-05-20 DIAGNOSIS — I70262 Atherosclerosis of native arteries of extremities with gangrene, left leg: Secondary | ICD-10-CM | POA: Diagnosis present

## 2021-05-20 DIAGNOSIS — K219 Gastro-esophageal reflux disease without esophagitis: Secondary | ICD-10-CM | POA: Diagnosis present

## 2021-05-20 DIAGNOSIS — Z79899 Other long term (current) drug therapy: Secondary | ICD-10-CM

## 2021-05-20 DIAGNOSIS — E119 Type 2 diabetes mellitus without complications: Secondary | ICD-10-CM | POA: Diagnosis not present

## 2021-05-20 DIAGNOSIS — D62 Acute posthemorrhagic anemia: Secondary | ICD-10-CM | POA: Diagnosis not present

## 2021-05-20 DIAGNOSIS — E1152 Type 2 diabetes mellitus with diabetic peripheral angiopathy with gangrene: Secondary | ICD-10-CM | POA: Diagnosis present

## 2021-05-20 DIAGNOSIS — F17210 Nicotine dependence, cigarettes, uncomplicated: Secondary | ICD-10-CM | POA: Diagnosis present

## 2021-05-20 DIAGNOSIS — Z7982 Long term (current) use of aspirin: Secondary | ICD-10-CM | POA: Diagnosis not present

## 2021-05-20 DIAGNOSIS — I701 Atherosclerosis of renal artery: Secondary | ICD-10-CM | POA: Diagnosis present

## 2021-05-20 DIAGNOSIS — R5381 Other malaise: Secondary | ICD-10-CM | POA: Diagnosis present

## 2021-05-20 DIAGNOSIS — E871 Hypo-osmolality and hyponatremia: Secondary | ICD-10-CM | POA: Diagnosis present

## 2021-05-20 DIAGNOSIS — T8789 Other complications of amputation stump: Secondary | ICD-10-CM

## 2021-05-20 DIAGNOSIS — Z7984 Long term (current) use of oral hypoglycemic drugs: Secondary | ICD-10-CM

## 2021-05-20 DIAGNOSIS — T8754 Necrosis of amputation stump, left lower extremity: Secondary | ICD-10-CM

## 2021-05-20 DIAGNOSIS — I739 Peripheral vascular disease, unspecified: Secondary | ICD-10-CM | POA: Diagnosis not present

## 2021-05-20 DIAGNOSIS — R400 Somnolence: Secondary | ICD-10-CM | POA: Diagnosis not present

## 2021-05-20 HISTORY — PX: AMPUTATION: SHX166

## 2021-05-20 LAB — CBC
HCT: 24.6 % — ABNORMAL LOW (ref 39.0–52.0)
Hemoglobin: 8.2 g/dL — ABNORMAL LOW (ref 13.0–17.0)
MCH: 27.5 pg (ref 26.0–34.0)
MCHC: 33.3 g/dL (ref 30.0–36.0)
MCV: 82.6 fL (ref 80.0–100.0)
Platelets: 480 10*3/uL — ABNORMAL HIGH (ref 150–400)
RBC: 2.98 MIL/uL — ABNORMAL LOW (ref 4.22–5.81)
RDW: 15.3 % (ref 11.5–15.5)
WBC: 21.5 10*3/uL — ABNORMAL HIGH (ref 4.0–10.5)
nRBC: 0 % (ref 0.0–0.2)

## 2021-05-20 LAB — GLUCOSE, CAPILLARY
Glucose-Capillary: 94 mg/dL (ref 70–99)
Glucose-Capillary: 85 mg/dL (ref 70–99)
Glucose-Capillary: 95 mg/dL (ref 70–99)
Glucose-Capillary: 94 mg/dL (ref 70–99)

## 2021-05-20 LAB — BASIC METABOLIC PANEL
Anion gap: 10 (ref 5–15)
BUN: 37 mg/dL — ABNORMAL HIGH (ref 8–23)
CO2: 24 mmol/L (ref 22–32)
Calcium: 9 mg/dL (ref 8.9–10.3)
Chloride: 95 mmol/L — ABNORMAL LOW (ref 98–111)
Creatinine, Ser: 0.99 mg/dL (ref 0.61–1.24)
GFR, Estimated: >60 mL/min (ref >60)
Glucose, Bld: 97 mg/dL (ref 70–99)
Potassium: 3.6 mmol/L (ref 3.5–5.1)
Sodium: 129 mmol/L — ABNORMAL LOW (ref 135–145)

## 2021-05-20 SURGERY — AMPUTATION, ABOVE KNEE
Anesthesia: Regional | Site: Knee | Laterality: Left

## 2021-05-20 MED ORDER — CEFAZOLIN SODIUM-DEXTROSE 2-3 GM-%(50ML) IV SOLR
INTRAVENOUS | Status: DC | PRN
  Administered 2021-05-20: 2 g via INTRAVENOUS

## 2021-05-20 MED ORDER — ADULT MULTIVITAMIN W/MINERALS CH
1.0000 | ORAL_TABLET | Freq: Every day | ORAL | Status: DC
  Administered 2021-05-21 – 2021-05-25 (×5): 1 via ORAL
  Filled 2021-05-20 (×5): qty 1

## 2021-05-20 MED ORDER — FOLIC ACID 1 MG PO TABS
1.0000 mg | ORAL_TABLET | Freq: Every day | ORAL | Status: DC
  Administered 2021-05-21 – 2021-05-25 (×5): 1 mg via ORAL
  Filled 2021-05-20 (×5): qty 1

## 2021-05-20 MED ORDER — FENTANYL CITRATE (PF) 250 MCG/5ML IJ SOLN
INTRAMUSCULAR | Status: AC
  Filled 2021-05-20: qty 5

## 2021-05-20 MED ORDER — FENTANYL CITRATE (PF) 250 MCG/5ML IJ SOLN
INTRAMUSCULAR | Status: DC | PRN
  Administered 2021-05-20: 25 ug via INTRAVENOUS

## 2021-05-20 MED ORDER — CHLORHEXIDINE GLUCONATE 0.12 % MT SOLN
15.0000 mL | OROMUCOSAL | Status: AC
  Filled 2021-05-20: qty 15

## 2021-05-20 MED ORDER — SODIUM CHLORIDE 0.9 % IV SOLN: INTRAVENOUS | Status: DC | PRN

## 2021-05-20 MED ORDER — FENTANYL CITRATE (PF) 100 MCG/2ML IJ SOLN: 25.0000 ug | INTRAMUSCULAR | Status: DC | PRN

## 2021-05-20 MED ORDER — HYDRALAZINE HCL 20 MG/ML IJ SOLN: 5.0000 mg | INTRAMUSCULAR | Status: DC | PRN

## 2021-05-20 MED ORDER — MORPHINE SULFATE (PF) 2 MG/ML IV SOLN
2.0000 mg | INTRAVENOUS | Status: DC | PRN
Start: 2021-05-20 — End: 2021-05-25

## 2021-05-20 MED ORDER — POLYETHYLENE GLYCOL 3350 17 G PO PACK: 17.0000 g | PACK | Freq: Every day | ORAL | Status: DC | PRN

## 2021-05-20 MED ORDER — BUPIVACAINE LIPOSOME 1.3 % IJ SUSP
INTRAMUSCULAR | Status: DC | PRN
  Administered 2021-05-20: 10 mg via PERINEURAL

## 2021-05-20 MED ORDER — GUAIFENESIN-DM 100-10 MG/5ML PO SYRP: 15.0000 mL | ORAL_SOLUTION | ORAL | Status: DC | PRN

## 2021-05-20 MED ORDER — BUPIVACAINE HCL (PF) 0.5 % IJ SOLN
INTRAMUSCULAR | Status: DC | PRN
  Administered 2021-05-20: 20 mL via PERINEURAL

## 2021-05-20 MED ORDER — FENTANYL CITRATE (PF) 100 MCG/2ML IJ SOLN
INTRAMUSCULAR | Status: AC
  Administered 2021-05-20: 100 ug
  Filled 2021-05-20: qty 2

## 2021-05-20 MED ORDER — SODIUM CHLORIDE 0.9 % IV SOLN
INTRAVENOUS | Status: DC | PRN
  Administered 2021-05-20: 50 ug/min via INTRAVENOUS

## 2021-05-20 MED ORDER — ONDANSETRON HCL 4 MG/2ML IJ SOLN
INTRAMUSCULAR | Status: DC | PRN
  Administered 2021-05-20: 4 mg via INTRAVENOUS

## 2021-05-20 MED ORDER — ACETAMINOPHEN 10 MG/ML IV SOLN: 1000.0000 mg | Freq: Once | INTRAVENOUS | Status: DC | PRN

## 2021-05-20 MED ORDER — POTASSIUM CHLORIDE CRYS ER 20 MEQ PO TBCR: 20.0000 meq | EXTENDED_RELEASE_TABLET | Freq: Every day | ORAL | Status: DC | PRN

## 2021-05-20 MED ORDER — LACTATED RINGERS IV SOLN: INTRAVENOUS | Status: DC

## 2021-05-20 MED ORDER — PHENOL 1.4 % MT LIQD: 1.0000 | OROMUCOSAL | Status: DC | PRN

## 2021-05-20 MED ORDER — HEPARIN SODIUM (PORCINE) 5000 UNIT/ML IJ SOLN
5000.0000 [IU] | Freq: Three times a day (TID) | INTRAMUSCULAR | Status: DC
  Administered 2021-05-21 – 2021-05-25 (×13): 5000 [IU] via SUBCUTANEOUS
  Filled 2021-05-20 (×12): qty 1

## 2021-05-20 MED ORDER — THIAMINE HCL 100 MG PO TABS
100.0000 mg | ORAL_TABLET | Freq: Every day | ORAL | Status: DC
  Administered 2021-05-21 – 2021-05-25 (×5): 100 mg via ORAL
  Filled 2021-05-20 (×5): qty 1

## 2021-05-20 MED ORDER — PROPOFOL 500 MG/50ML IV EMUL
INTRAVENOUS | Status: DC | PRN
  Administered 2021-05-20: 50 mg via INTRAVENOUS
  Administered 2021-05-20: 100 mg via INTRAVENOUS

## 2021-05-20 MED ORDER — PANTOPRAZOLE SODIUM 40 MG PO TBEC: 40.0000 mg | DELAYED_RELEASE_TABLET | Freq: Every day | ORAL | Status: DC

## 2021-05-20 MED ORDER — ATORVASTATIN CALCIUM 10 MG PO TABS
20.0000 mg | ORAL_TABLET | Freq: Every day | ORAL | Status: DC
  Administered 2021-05-20 – 2021-05-24 (×5): 20 mg via ORAL
  Filled 2021-05-20 (×5): qty 2

## 2021-05-20 MED ORDER — MAGNESIUM SULFATE 2 GM/50ML IV SOLN: 2.0000 g | Freq: Every day | INTRAVENOUS | Status: DC | PRN

## 2021-05-20 MED ORDER — MIRTAZAPINE 15 MG PO TABS: 7.5000 mg | ORAL_TABLET | Freq: Every day | ORAL | Status: DC

## 2021-05-20 MED ORDER — GABAPENTIN 100 MG PO CAPS: 100.0000 mg | ORAL_CAPSULE | Freq: Every day | ORAL | Status: DC

## 2021-05-20 MED ORDER — INSULIN ASPART 100 UNIT/ML IJ SOLN
0.0000 [IU] | Freq: Three times a day (TID) | INTRAMUSCULAR | Status: DC
Start: 2021-05-21 — End: 2021-05-21

## 2021-05-20 MED ORDER — SENNOSIDES-DOCUSATE SODIUM 8.6-50 MG PO TABS: 1.0000 | ORAL_TABLET | Freq: Two times a day (BID) | ORAL | Status: DC

## 2021-05-20 MED ORDER — OXYCODONE-ACETAMINOPHEN 5-325 MG PO TABS
1.0000 | ORAL_TABLET | ORAL | Status: DC | PRN
Start: 2021-05-20 — End: 2021-05-25
  Administered 2021-05-20 – 2021-05-25 (×15): 2 via ORAL
  Filled 2021-05-20 (×15): qty 2

## 2021-05-20 MED ORDER — ALUM & MAG HYDROXIDE-SIMETH 200-200-20 MG/5ML PO SUSP: 15.0000 mL | ORAL | Status: DC | PRN

## 2021-05-20 MED ORDER — BACITRACIN ZINC 500 UNIT/GM EX OINT
TOPICAL_OINTMENT | CUTANEOUS | Status: AC
  Filled 2021-05-20: qty 28.35

## 2021-05-20 MED ORDER — METOPROLOL TARTRATE 5 MG/5ML IV SOLN: 2.0000 mg | INTRAVENOUS | Status: DC | PRN

## 2021-05-20 MED ORDER — CHLORHEXIDINE GLUCONATE 0.12 % MT SOLN
OROMUCOSAL | Status: AC
  Administered 2021-05-20: 15 mL via OROMUCOSAL
  Filled 2021-05-20: qty 15

## 2021-05-20 MED ORDER — ASPIRIN EC 81 MG PO TBEC
81.0000 mg | DELAYED_RELEASE_TABLET | Freq: Every day | ORAL | Status: DC
  Administered 2021-05-21 – 2021-05-25 (×5): 81 mg via ORAL
  Filled 2021-05-20 (×5): qty 1

## 2021-05-20 MED ORDER — ONDANSETRON HCL 4 MG/2ML IJ SOLN: 4.0000 mg | Freq: Once | INTRAMUSCULAR | Status: DC | PRN

## 2021-05-20 MED ORDER — AMLODIPINE BESYLATE 10 MG PO TABS: 10.0000 mg | ORAL_TABLET | Freq: Every day | ORAL | Status: DC

## 2021-05-20 MED ORDER — ATENOLOL 25 MG PO TABS
25.0000 mg | ORAL_TABLET | Freq: Two times a day (BID) | ORAL | Status: DC
  Administered 2021-05-20 – 2021-05-25 (×10): 25 mg via ORAL
  Filled 2021-05-20 (×10): qty 1

## 2021-05-20 MED ORDER — OXYBUTYNIN CHLORIDE 5 MG PO TABS
2.5000 mg | ORAL_TABLET | Freq: Two times a day (BID) | ORAL | Status: DC
  Administered 2021-05-21 – 2021-05-25 (×9): 2.5 mg via ORAL
  Filled 2021-05-20 (×11): qty 0.5

## 2021-05-20 MED ORDER — PROPOFOL 10 MG/ML IV BOLUS
INTRAVENOUS | Status: AC
  Filled 2021-05-20: qty 20

## 2021-05-20 MED ORDER — AMLODIPINE BESYLATE 10 MG PO TABS
10.0000 mg | ORAL_TABLET | Freq: Every day | ORAL | Status: DC
  Administered 2021-05-21 – 2021-05-25 (×5): 10 mg via ORAL
  Filled 2021-05-20 (×5): qty 1

## 2021-05-20 MED ORDER — DOCUSATE SODIUM 100 MG PO CAPS
100.0000 mg | ORAL_CAPSULE | Freq: Every day | ORAL | Status: DC
  Administered 2021-05-21 – 2021-05-25 (×5): 100 mg via ORAL
  Filled 2021-05-20 (×5): qty 1

## 2021-05-20 MED ORDER — LABETALOL HCL 5 MG/ML IV SOLN: 10.0000 mg | INTRAVENOUS | Status: DC | PRN

## 2021-05-20 MED ORDER — ONDANSETRON HCL 4 MG/2ML IJ SOLN: 4.0000 mg | Freq: Four times a day (QID) | INTRAMUSCULAR | Status: DC | PRN

## 2021-05-20 MED ORDER — 0.9 % SODIUM CHLORIDE (POUR BTL) OPTIME
TOPICAL | Status: DC | PRN
  Administered 2021-05-20: 1000 mL

## 2021-05-20 SURGICAL SUPPLY — 48 items
BAG COUNTER SPONGE SURGICOUNT (BAG) ×2 IMPLANT
BAG SPNG CNTER NS LX DISP (BAG) ×1
BANDAGE ESMARK 6X9 LF (GAUZE/BANDAGES/DRESSINGS) ×1 IMPLANT
BLADE SAW SAG 73X25 THK (BLADE) ×1
BLADE SAW SGTL 73X25 THK (BLADE) ×1 IMPLANT
BNDG CMPR 9X6 STRL LF SNTH (GAUZE/BANDAGES/DRESSINGS) ×1
BNDG COHESIVE 6X5 TAN STRL LF (GAUZE/BANDAGES/DRESSINGS) ×2 IMPLANT
BNDG ELASTIC 4X5.8 VLCR STR LF (GAUZE/BANDAGES/DRESSINGS) ×2 IMPLANT
BNDG ELASTIC 6X5.8 VLCR STR LF (GAUZE/BANDAGES/DRESSINGS) ×2 IMPLANT
BNDG ESMARK 6X9 LF (GAUZE/BANDAGES/DRESSINGS) ×2
BNDG GAUZE ELAST 4 BULKY (GAUZE/BANDAGES/DRESSINGS) ×3 IMPLANT
CANISTER SUCT 3000ML PPV (MISCELLANEOUS) ×2 IMPLANT
CLIP VESOCCLUDE MED 6/CT (CLIP) ×2 IMPLANT
COVER BACK TABLE 60X90IN (DRAPES) ×2 IMPLANT
COVER SURGICAL LIGHT HANDLE (MISCELLANEOUS) ×4 IMPLANT
DRAIN CHANNEL 19F RND (DRAIN) IMPLANT
DRAPE HALF SHEET 40X57 (DRAPES) ×2 IMPLANT
DRAPE ORTHO SPLIT 77X108 STRL (DRAPES) ×4
DRAPE SURG ORHT 6 SPLT 77X108 (DRAPES) ×2 IMPLANT
DRAPE U-SHAPE 47X51 STRL (DRAPES) IMPLANT
DRSG ADAPTIC 3X8 NADH LF (GAUZE/BANDAGES/DRESSINGS) ×2 IMPLANT
ELECT CAUTERY BLADE 6.4 (BLADE) ×2 IMPLANT
ELECT REM PT RETURN 9FT ADLT (ELECTROSURGICAL) ×2
ELECTRODE REM PT RTRN 9FT ADLT (ELECTROSURGICAL) ×1 IMPLANT
EVACUATOR SILICONE 100CC (DRAIN) IMPLANT
GAUZE SPONGE 4X4 12PLY STRL (GAUZE/BANDAGES/DRESSINGS) ×3 IMPLANT
GLOVE SRG 8 PF TXTR STRL LF DI (GLOVE) ×1 IMPLANT
GLOVE SURG ENC MOIS LTX SZ7.5 (GLOVE) ×2 IMPLANT
GLOVE SURG UNDER POLY LF SZ8 (GLOVE) ×2
GOWN STRL REUS W/ TWL XL LVL3 (GOWN DISPOSABLE) ×1 IMPLANT
GOWN STRL REUS W/TWL XL LVL3 (GOWN DISPOSABLE) ×2
KIT BASIN OR (CUSTOM PROCEDURE TRAY) ×2 IMPLANT
KIT TURNOVER KIT B (KITS) ×2 IMPLANT
NS IRRIG 1000ML POUR BTL (IV SOLUTION) ×2 IMPLANT
PACK GENERAL/GYN (CUSTOM PROCEDURE TRAY) ×2 IMPLANT
PAD ARMBOARD 7.5X6 YLW CONV (MISCELLANEOUS) ×4 IMPLANT
STAPLER VISISTAT 35W (STAPLE) ×3 IMPLANT
STOCKINETTE IMPERVIOUS LG (DRAPES) ×2 IMPLANT
SUT ETHILON 3 0 PS 1 (SUTURE) IMPLANT
SUT SILK 0 TIES 10X30 (SUTURE) ×2 IMPLANT
SUT SILK 2 0 (SUTURE) ×2
SUT SILK 2 0 SH CR/8 (SUTURE) ×2 IMPLANT
SUT SILK 2-0 18XBRD TIE 12 (SUTURE) ×1 IMPLANT
SUT VIC AB 2-0 CT1 18 (SUTURE) ×4 IMPLANT
SUT VIC AB 3-0 SH 18 (SUTURE) IMPLANT
TOWEL GREEN STERILE (TOWEL DISPOSABLE) ×4 IMPLANT
UNDERPAD 30X36 HEAVY ABSORB (UNDERPADS AND DIAPERS) ×2 IMPLANT
WATER STERILE IRR 1000ML POUR (IV SOLUTION) ×2 IMPLANT

## 2021-05-20 NOTE — Progress Notes (Signed)
Patient to room 4E14 from PACU. Vital signs obtained. On monitor CCMD notified. CHG bath completed. Alert and oriented to room and call light. Call bell within reach.

## 2021-05-20 NOTE — Anesthesia Preprocedure Evaluation (Addendum)
Anesthesia Evaluation  Patient identified by MRN, date of birth, ID band Patient awake    Reviewed: Allergy & Precautions, NPO status , Patient's Chart, lab work & pertinent test results  Airway Mallampati: II  TM Distance: >3 FB Neck ROM: Full    Dental  (+) Missing, Poor Dentition   Pulmonary neg pulmonary ROS, Current Smoker,    Pulmonary exam normal        Cardiovascular hypertension, Pt. on medications and Pt. on home beta blockers + Peripheral Vascular Disease   Rhythm:Regular Rate:Normal     Neuro/Psych    GI/Hepatic GERD  Medicated,(+)     substance abuse  alcohol use,   Endo/Other  diabetes, Type 2, Insulin Dependent  Renal/GU      Musculoskeletal   Abdominal (+)  Abdomen: soft. Bowel sounds: normal.  Peds  Hematology   Anesthesia Other Findings   Reproductive/Obstetrics                            Anesthesia Physical Anesthesia Plan  ASA: 3  Anesthesia Plan: Regional and General   Post-op Pain Management:  Regional for Post-op pain   Induction: Intravenous  PONV Risk Score and Plan: 1 and Ondansetron, Dexamethasone and Propofol infusion  Airway Management Planned: Mask and LMA  Additional Equipment: None  Intra-op Plan:   Post-operative Plan: Extubation in OR  Informed Consent: I have reviewed the patients History and Physical, chart, labs and discussed the procedure including the risks, benefits and alternatives for the proposed anesthesia with the patient or authorized representative who has indicated his/her understanding and acceptance.     Dental advisory given  Plan Discussed with: CRNA  Anesthesia Plan Comments: (Lab Results      Component                Value               Date                      WBC                      21.5 (H)            05/20/2021                HGB                      8.2 (L)             05/20/2021                HCT                       24.6 (L)            05/20/2021                MCV                      82.6                05/20/2021                PLT                      480 (H)  05/20/2021           Lab Results      Component                Value               Date                      NA                       129 (L)             05/20/2021                K                        3.6                 05/20/2021                CO2                      24                  05/20/2021                GLUCOSE                  97                  05/20/2021                BUN                      37 (H)              05/20/2021                CREATININE               0.99                05/20/2021                CALCIUM                  9.0                 05/20/2021                GFRNONAA                 >60                 05/20/2021          )       Anesthesia Quick Evaluation

## 2021-05-20 NOTE — Transfer of Care (Signed)
Immediate Anesthesia Transfer of Care Note  Patient: Todd Duran  Procedure(s) Performed: AMPUTATION ABOVE KNEE (Left: Knee)  Patient Location: PACU  Anesthesia Type:General  Level of Consciousness: drowsy and patient cooperative  Airway & Oxygen Therapy: Patient Spontanous Breathing  Post-op Assessment: Report given to RN and Post -op Vital signs reviewed and stable  Post vital signs: Reviewed and stable  Last Vitals:  Vitals Value Taken Time  BP 104/68 05/20/21 1706  Temp    Pulse 90 05/20/21 1708  Resp 24 05/20/21 1708  SpO2 98 % 05/20/21 1708  Vitals shown include unvalidated device data.  Last Pain:  Vitals:   05/20/21 1434  TempSrc: Oral         Complications: No notable events documented.

## 2021-05-20 NOTE — Plan of Care (Signed)
Patient went to surgery for revision of AKA.

## 2021-05-20 NOTE — Progress Notes (Signed)
Occupational Therapy Session Note  Patient Details  Name: Todd Duran MRN: [id number] Date of Birth: [birth year]  Today's Date: 05/20/2021 OT Individual Time: 5830-9407 OT Individual Time Calculation (min): 10 min  and Today's Date: 05/20/2021 OT Missed Time: 30 Minutes Missed Time Reason: Patient fatigue   Short Term Goals: Week 1:  OT Short Term Goal 1 (Week 1): Pt will complete LB dressing with min A and use of AE, as needed. OT Short Term Goal 2 (Week 1): Pt will complete toileting task with min A. OT Short Term Goal 3 (Week 1): Pt will complete functional transfers (toilet, shower) with min A. OT Short Term Goal 4 (Week 1): To promote UB strength and functional endurance of ADLs, pt will engage in UB strengthening x5 min without rest break.  Skilled Therapeutic Interventions/Progress Updates:     Pt received in bed asleep. Pt only arouses to cold wash cloth. Pt unable to state where we were. When reoriented, pt states, "oh I knew that." Pt unable to maintain arousal, vitals assessed as written below. MD arrives and states pt should stay in bed.  100/69 supine 96% O2 HR69  Pt left at end of session in bed with exit alarm on, call light in reach and all needs met   Therapy Documentation Precautions:  Precautions Precautions: Fall Precaution Comments: L- BKA Required Braces or Orthoses: Other Brace Other Brace: shrinker, limb guard Restrictions Weight Bearing Restrictions: Yes LLE Weight Bearing: Non weight bearing General:   Vital Signs: Therapy Vitals Temp: 98.2 F (36.8 C) Temp Source: Oral Pulse Rate: 96 Resp: 16 BP: 116/64 Patient Position (if appropriate): Lying Oxygen Therapy SpO2: 97 % O2 Device: Room Air Pain:   ADL:   Vision   Perception    Praxis   Exercises:   Other Treatments:     Therapy/Group: Individual Therapy  Tonny Branch 05/20/2021, 6:47 AM

## 2021-05-20 NOTE — Anesthesia Procedure Notes (Signed)
Anesthesia Regional Block: Femoral nerve block   Pre-Anesthetic Checklist: , timeout performed,  Correct Patient, Correct Site, Correct Laterality,  Correct Procedure, Correct Position, site marked,  Risks and benefits discussed,  Surgical consent,  Pre-op evaluation,  At surgeon's request and post-op pain management  Laterality: Left  Prep: Dura Prep       Needles:  Injection technique: Single-shot  Needle Type: Echogenic Stimulator Needle     Needle Length: 10cm  Needle Gauge: 20     Additional Needles:   Procedures:,,,, ultrasound used (permanent image in chart),,    Narrative:  Start time: 05/20/2021 2:47 PM End time: 05/20/2021 2:49 PM Injection made incrementally with aspirations every 5 mL.  Performed by: Personally  Anesthesiologist: Atilano Median, DO  Additional Notes: Patient identified. Risks/Benefits/Options discussed with patient including but not limited to bleeding, infection, nerve damage, failed block, incomplete pain control. Patient expressed understanding and wished to proceed. All questions were answered. Sterile technique was used throughout the entire procedure. Please see nursing notes for vital signs. Aspirated in 5cc intervals with injection for negative confirmation. Patient was given instructions on fall risk and not to get out of bed. All questions and concerns addressed with instructions to call with any issues or inadequate analgesia.

## 2021-05-20 NOTE — Progress Notes (Signed)
Patient ID: Todd Duran, male   DOB: 1946-06-25, 75 y.o.   MRN: [id number]  SW met with pt in room to perform wellness check since pt will d/c today for procedure. Pt appears to be managing as well as to be expected. SW called pt wife Todd Duran as well. She intends to be here with patient when he gets out of surgery.   SW left message for [place] [phone]) to inform pt will d/c to acute hospital for L BKA revision, and unsure on if pt will return.   Loralee Pacas, MSW, [place] Office: [phone] Cell: [phone] Fax: [phone]

## 2021-05-20 NOTE — Progress Notes (Signed)
Occupational Therapy Session Note  Patient Details  Name: Todd Duran MRN: [id number] Date of Birth: 10/01/1946  Today's Date: 05/20/2021 OT Individual Time: 3235-5732 OT Individual Time Calculation (min): 20 min  and Today's Date: 05/20/2021 OT Missed Time: 40 Minutes Missed Time Reason: Patient fatigue   Short Term Goals: Week 1:  OT Short Term Goal 1 (Week 1): Pt will complete LB dressing with min A and use of AE, as needed. OT Short Term Goal 2 (Week 1): Pt will complete toileting task with min A. OT Short Term Goal 3 (Week 1): Pt will complete functional transfers (toilet, shower) with min A. OT Short Term Goal 4 (Week 1): To promote UB strength and functional endurance of ADLs, pt will engage in UB strengthening x5 min without rest break.  Skilled Therapeutic Interventions/Progress Updates:  Pt received asleep in supine but easily able to arouse and able to state name and DOB as well as location, with pt stating "I am here for surgery." Noted MD prefer for pt to remain in bed this session, pt able to tolerate light BUE therex from bed level with level 1 theraband. Pt completed x10 reps bicep curls, shoulder flexion, and tricep rows with pt needing light hand over hand assist to complete full ROM. Pt fatigues quickly during session noted to close eyes often during session with pt  having difficulty sustaining attention to task, pt left supine in bed, with bed alarm activated and all needs within reach.   Therapy Documentation Precautions:  Precautions Precautions: Fall Precaution Comments: L- BKA Required Braces or Orthoses: Other Brace Other Brace: shrinker, limb guard Restrictions Weight Bearing Restrictions: Yes LLE Weight Bearing: Non weight bearing General: General OT Amount of Missed Time: 40 Minutes Vital Signs:   Pain: Pt reports pain in low back, assisted pt with rolling into sidelying as pain mgmt strategy, additionally provided rest breaks as pain mgmt strategy.     Therapy/Group: Individual Therapy  Pollyann Glen [facility] 05/20/2021, 10:33 AM

## 2021-05-20 NOTE — Anesthesia Procedure Notes (Signed)
Procedure Name: LMA Insertion Date/Time: 05/20/2021 4:09 PM Performed by: Lendon Ka, CRNA Pre-anesthesia Checklist: Patient identified, Emergency Drugs available, Suction available and Patient being monitored Patient Re-evaluated:Patient Re-evaluated prior to induction Oxygen Delivery Method: Circle System Utilized Preoxygenation: Pre-oxygenation with 100% oxygen Induction Type: IV induction Ventilation: Mask ventilation without difficulty LMA: LMA inserted LMA Size: 5.0 Number of attempts: 1 Airway Equipment and Method: Bite block Placement Confirmation: positive ETCO2 Tube secured with: Tape Dental Injury: Teeth and Oropharynx as per pre-operative assessment

## 2021-05-20 NOTE — Progress Notes (Signed)
Occupational Therapy Session Note  Patient Details  Name: Todd Duran MRN: [id number] Date of Birth: 02-23-1946  Today's Date: 05/20/2021 OT Individual Time: 1310-1345 OT Individual Time Calculation (min): 35 min    Short Term Goals: Week 1:  OT Short Term Goal 1 (Week 1): Pt will complete LB dressing with min A and use of AE, as needed. OT Short Term Goal 2 (Week 1): Pt will complete toileting task with min A. OT Short Term Goal 3 (Week 1): Pt will complete functional transfers (toilet, shower) with min A. OT Short Term Goal 4 (Week 1): To promote UB strength and functional endurance of ADLs, pt will engage in UB strengthening x5 min without rest break.  Skilled Therapeutic Interventions/Progress Updates:    Pt sleeping upon arrival, easily awoken and agreeable to bed level ADLs. His wife present throughout session. NT entered and brought CHG bath supplies. Pt transitioned into long sitting in bed with min A using B bed rails to doff shirt. He reported 6/10 pain in his L residual limb throughout session, rest breaks provided as pain management. Pt completed UB bathing with min A. Mod A for LB ADLs. Pt frequently falling asleep and fatigued. He requested to use an oral swab d/t discomfort from NPO status. He was left supine with all needs met, bed alarm set. 25 min missed d/t fatigue.   Therapy Documentation Precautions:  Precautions Precautions: Fall Precaution Comments: L- BKA Required Braces or Orthoses: Other Brace Other Brace: shrinker, limb guard Restrictions Weight Bearing Restrictions: Yes LLE Weight Bearing: Non weight bearing General: General OT Amount of Missed Time: 25 Minutes   Therapy/Group: Individual Therapy  Curtis Sites 05/20/2021, 1:49 PM

## 2021-05-20 NOTE — Progress Notes (Signed)
Inpatient Rehabilitation Admissions Coordinator   Patient from CIR. I await therapy revevals and then will follow up for appropriate rehab venue discussions. [facility] will need reauth for return to [facility].  Ottie Glazier, RN, MSN Rehab Admissions Coordinator [phone] 05/20/2021 7:27 PM

## 2021-05-20 NOTE — Progress Notes (Signed)
PROGRESS NOTE   Subjective/Complaints: Pt in bed. Odor in room from left leg. Reports his pain is stable.   ROS: Limited due to cognitive/behavioral    Objective:   No results found. Recent Labs    05/18/21 0712 05/18/21 1230 05/19/21 0616  WBC 22.1*  --  23.4*  HGB 6.7* 6.7* 8.8*  HCT 20.9* 20.9* 25.6*  PLT 478*  --  450*   Recent Labs    05/18/21 0712  NA 128*  K 3.8  CL 92*  CO2 23  GLUCOSE 138*  BUN 34*  CREATININE 1.31*  CALCIUM 9.8    Intake/Output Summary (Last 24 hours) at 05/20/2021 0852 Last data filed at 05/20/2021 0730 Gross per 24 hour  Intake 360 ml  Output 1075 ml  Net -715 ml        Physical Exam: Vital Signs Blood pressure 116/64, pulse 96, temperature 98.2 F (36.8 C), temperature source Oral, resp. rate 16, height 5\' 11"  (1.803 m), weight 55.7 kg, SpO2 97 %. Constitutional: No distress . Vital signs reviewed. HEENT: EOMI, oral membranes moist Neck: supple Cardiovascular: RRR without murmur. No JVD    Respiratory/Chest: CTA Bilaterally without wheezes or rales. Normal effort    GI/Abdomen: BS +, non-tender, non-distended Ext: no clubbing, cyanosis, or edema Psych: flat Musculoskeletal:    Cervical back: Normal range of motion.    Comments: Left residual limb still tender, Skin:    General: Skin is warm.    Comments:    Continued area along incision which is boggy, + foul odor, ischemic appearing Neurological:    Comments: A little more slowed, delayed today.   Oriented to person and follows commands with extra time. UE 5/5. RLE 4/5 prox to distal. Able to lift left leg off chair. No changes     Assessment/Plan: 1. Functional deficits which require 3+ hours per day of interdisciplinary therapy in a comprehensive inpatient rehab setting. Physiatrist is providing close team supervision and 24 hour management of active medical problems listed below. Physiatrist and rehab team  continue to assess barriers to discharge/monitor patient progress toward functional and medical goals  Care Tool:  Bathing    Body parts bathed by patient: Left arm, Chest, Abdomen, Face   Body parts bathed by helper: Right arm, Right lower leg, Left upper leg Body parts n/a: Left lower leg   Bathing assist Assist Level: Moderate Assistance - Patient 50 - 74%     Upper Body Dressing/Undressing Upper body dressing   What is the patient wearing?: Pull over shirt    Upper body assist Assist Level: Minimal Assistance - Patient > 75%    Lower Body Dressing/Undressing Lower body dressing      What is the patient wearing?: Pants     Lower body assist Assist for lower body dressing: 2 Helpers     Toileting Toileting Toileting Activity did not occur (Clothing management and hygiene only): N/A (no void or bm)  Toileting assist Assist for toileting: Dependent - Patient 0%     Transfers Chair/bed transfer  Transfers assist  Chair/bed transfer activity did not occur: Safety/medical concerns (patient with symptoms of OH)  Chair/bed transfer assist level: Moderate Assistance -  Patient 50 - 74%     Locomotion Ambulation   Ambulation assist   Ambulation activity did not occur: Safety/medical concerns          Walk 10 feet activity   Assist  Walk 10 feet activity did not occur: Safety/medical concerns        Walk 50 feet activity   Assist Walk 50 feet with 2 turns activity did not occur: Safety/medical concerns         Walk 150 feet activity   Assist Walk 150 feet activity did not occur: Safety/medical concerns         Walk 10 feet on uneven surface  activity   Assist Walk 10 feet on uneven surfaces activity did not occur: Safety/medical concerns         Wheelchair     Assist Will patient use wheelchair at discharge?: Yes (Per PT long term goals) Type of Wheelchair: Manual Wheelchair activity did not occur: Safety/medical concerns          Wheelchair 50 feet with 2 turns activity    Assist    Wheelchair 50 feet with 2 turns activity did not occur: Safety/medical concerns       Wheelchair 150 feet activity     Assist  Wheelchair 150 feet activity did not occur: Safety/medical concerns       Blood pressure 116/64, pulse 96, temperature 98.2 F (36.8 C), temperature source Oral, resp. rate 16, height 5\' 11"  (1.803 m), weight 55.7 kg, SpO2 97 %.  Medical Problem List and Plan: 1. 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  Impaired mobility: Continue Subcutaneous heparin             -antiplatelet therapy: Aspirin 81 mg daily 3. Residual limb/phantom pain: Schedule Tylenol 650mg  q8H.             - phantom limb pain LLE. Continue low dose gabapentin 100mg  qhs                         -  limb massage and visual feedback 4. Mood: Remeron added for insomnia, appetite 7.5mg  qhs             -antipsychotic agents: N/A 5. Neuropsych: This patient is capable of making decisions on own his behalf. 6. Skin/Wound Care: continue shrinker over limb for healing, edema control             -limb guard for protection, promotion of knee extension  7/12 -boggy/necrotic area concerning along/above incision. WBC's up to 23k. Dr. to take to OR today for revision to AKA -continue local care,   7. Fluids/Electrolytes/Nutrition: encourage PO fluids  I personally reviewed the patient's labs today.  BUN/Cr elevated  -check BMET, CBC tomorrow 8.  Acute blood loss anemia. 7/11 Follow-up hgb 6.7 today--f/u lab was the same   -transfused 2u PRBC 7/11--up to 8.8 7/12 9.  Hypertension.  Norvasc 10 mg daily, Tenormin 25 mg twice daily.  Monitor with increased mobility             -bp controlled 7/12, stopped hctz given low na+ 10.  Diabetes mellitus.  Hemoglobin A1c 5.2.  SSI.  Patient on Glucophage 500 mg daily prior to admission.   -monitor CBG's and resume as needed -7/13 fair  control at present 11.  Hyperlipidemia.  Lipitor 12.  Tobacco/alcohol use.  Provide counseling 13. Hypomagnesemia: resolved with supplementation.  14. Hyponatremia: 131 to 128, stable at 128 on 7/10 and 7/11  -stop hctz--see above 15. UTI:  UC negative--stopped abx 7/11 16. Confusion: d/c oxycodone with some improvement       LOS: 5 days A FACE TO FACE EVALUATION WAS PERFORMED  Ranelle Oyster 05/20/2021, 8:52 AM

## 2021-05-20 NOTE — Progress Notes (Signed)
Physical Therapy Note  Patient Details  Name: Todd Duran MRN: [id number] Date of Birth: 17-Dec-1945 Today's Date: 05/20/2021    Patient taken off the unit to surgery upon PT arrival. Patient missed 45 min of skilled PT. Will attempt to make up missed time as able.   Kynli Chou L Jaymee Tilson PT, DPT  05/20/2021, 2:34 PM

## 2021-05-20 NOTE — Anesthesia Procedure Notes (Signed)
Anesthesia Regional Block: Sciatic   Pre-Anesthetic Checklist: , timeout performed,  Correct Patient, Correct Site, Correct Laterality,  Correct Procedure, Correct Position, site marked,  Risks and benefits discussed,  Surgical consent,  Pre-op evaluation,  At surgeon's request and post-op pain management  Laterality: Left  Prep: Dura Prep       Needles:  Injection technique: Single-shot  Needle Type: Echogenic Stimulator Needle     Needle Length: 10cm  Needle Gauge: 20     Additional Needles:   Procedures:,,,, ultrasound used (permanent image in chart),,    Narrative:  Start time: 05/20/2021 2:52 PM End time: 05/20/2021 2:54 PM  Performed by: Personally  Anesthesiologist: Jayquan Bradsher, Nelle Don, DO  Additional Notes: Patient identified. Risks/Benefits/Options discussed with patient including but not limited to bleeding, infection, nerve damage, failed block, incomplete pain control. Patient expressed understanding and wished to proceed. All questions were answered. Sterile technique was used throughout the entire procedure. Please see nursing notes for vital signs. Aspirated in 5cc intervals with injection for negative confirmation. Patient was given instructions on fall risk and not to get out of bed. All questions and concerns addressed with instructions to call with any issues or inadequate analgesia.

## 2021-05-20 NOTE — Progress Notes (Signed)
Report given to OR nurse. Patient Alert and oriented x1-2 at time of transfer. No c/o pain or discomfort at this time. CHG bath performed prior to transfer. Wife at bedside. All questions answered. Consents sent with patient.

## 2021-05-20 NOTE — Progress Notes (Signed)
[facility]  Subjective  -no complaints.  White count 21   Objective 115/64 91 (!) 100.9 F (38.3 C) (Oral) 20 98% No intake or output data in the 24 hours ending 05/20/21 1457  Necrotic left BKA  Laboratory Lab Results: Recent Labs    05/19/21 0616 05/20/21 0915  WBC 23.4* 21.5*  HGB 8.8* 8.2*  HCT 25.6* 24.6*  PLT 450* 480*   BMET Recent Labs    05/18/21 0712 05/20/21 0915  NA 128* 129*  K 3.8 3.6  CL 92* 95*  CO2 23 24  GLUCOSE 138* 97  BUN 34* 37*  CREATININE 1.31* 0.99  CALCIUM 9.8 9.0    COAG No results found for: INR, PROTIME No results found for: PTT  Assessment/Planning:  75 year old male status post left below-knee amputation by Dr. Myra Gianotti on 05/13/2021.  The below-knee amputation stump is frankly necrotic and foul-smelling and he has a significantly elevated white count.  I have recommended conversion to left above-knee amputation.  Risks and benefits discussed.  Todd Duran 05/20/2021 2:57 PM --

## 2021-05-20 NOTE — Op Note (Signed)
Date: May 20, 2021  Preoperative diagnosis: Necrotic left below-knee amputation  Postoperative diagnosis: Same  Procedure: Left above-knee amputation  Surgeon: Dr. Cephus Shelling, MD  Assistant: Doreatha Massed, PA  Indications: Patient is a 75 year old male who underwent left below-knee amputation by Dr. Myra Gianotti on 05/13/2021.  I was called yesterday by the rehab service for evaluation of his left below-knee amputation stump that appeared necrotic and he had a severe leukocytosis.  I recommended conversion to an above-knee amputation as I did not feel his below-knee amputation was salvageable.  He presents today after risk benefits discussed.  Findings: The left above-knee amputation had healthy tissue margins with healthy appearing muscle.  Anesthesia: Regional block  Details: Patient was taken to the operating room after informed consent was obtained.  Placed on operative table supine position.  The left below-knee amputation and the left thigh were then prepped and draped in standard sterile fashion.  I did use a sterile sock to get the below-knee amputation wound out of the field.  Once the wound was completely prepped he did get preoperative antibiotics.  Timeout was performed.  I initially marked out a fishmouth incision approximately 1 handbreadth above the patella.  I then made a fishmouth incision with a scalpel and carried the dissection down through the fascia and muscle with Bovie cautery.  The femur was transected with an oscillating saw.  We completed the posterior flap and I clamped the femoral vessels and these were ligated over hemostats.  The femoral vessels were then ligated with 2-0 silk and 2-0 Vicryl suture for suture ligature.  Hemostasis was then obtained with additional Bovie cautery and silk suture.  Once we had good hemostasis, I irrigated out the wound.  I did take the bone back a little further to prevent any additional pressure on flap closure.  The fascia was  then re-approximated with 2-0 Vicryl until we could not insert a fingertip.  The skin was closed with staples.  Dry sterile dressings were applied.  Complication: None  Condition: Stable  Cephus Shelling, MD [facility] Office: [phone]   Cephus Shelling

## 2021-05-21 ENCOUNTER — Encounter (HOSPITAL_COMMUNITY): Payer: Self-pay | Admitting: Vascular Surgery

## 2021-05-21 LAB — CBC
HCT: 23 % — ABNORMAL LOW (ref 39.0–52.0)
Hemoglobin: 7.3 g/dL — ABNORMAL LOW (ref 13.0–17.0)
MCH: 26.8 pg (ref 26.0–34.0)
MCHC: 31.7 g/dL (ref 30.0–36.0)
MCV: 84.6 fL (ref 80.0–100.0)
Platelets: 474 10*3/uL — ABNORMAL HIGH (ref 150–400)
RBC: 2.72 MIL/uL — ABNORMAL LOW (ref 4.22–5.81)
RDW: 15.3 % (ref 11.5–15.5)
WBC: 15 10*3/uL — ABNORMAL HIGH (ref 4.0–10.5)
nRBC: 0 % (ref 0.0–0.2)

## 2021-05-21 LAB — BASIC METABOLIC PANEL
Anion gap: 8 (ref 5–15)
BUN: 40 mg/dL — ABNORMAL HIGH (ref 8–23)
CO2: 25 mmol/L (ref 22–32)
Calcium: 8.8 mg/dL — ABNORMAL LOW (ref 8.9–10.3)
Chloride: 96 mmol/L — ABNORMAL LOW (ref 98–111)
Creatinine, Ser: 0.99 mg/dL (ref 0.61–1.24)
GFR, Estimated: >60 mL/min (ref >60)
Glucose, Bld: 119 mg/dL — ABNORMAL HIGH (ref 70–99)
Potassium: 3.7 mmol/L (ref 3.5–5.1)
Sodium: 129 mmol/L — ABNORMAL LOW (ref 135–145)

## 2021-05-21 LAB — GLUCOSE, CAPILLARY
Glucose-Capillary: 132 mg/dL — ABNORMAL HIGH (ref 70–99)
Glucose-Capillary: 249 mg/dL — ABNORMAL HIGH (ref 70–99)
Glucose-Capillary: 120 mg/dL — ABNORMAL HIGH (ref 70–99)
Glucose-Capillary: 198 mg/dL — ABNORMAL HIGH (ref 70–99)

## 2021-05-21 MED ORDER — INSULIN ASPART 100 UNIT/ML IJ SOLN
0.0000 [IU] | Freq: Three times a day (TID) | INTRAMUSCULAR | Status: DC
Start: 2021-05-21 — End: 2021-05-25
  Administered 2021-05-21: 3 [IU] via SUBCUTANEOUS
  Administered 2021-05-22 (×2): 1 [IU] via SUBCUTANEOUS
  Administered 2021-05-23: 2 [IU] via SUBCUTANEOUS
  Administered 2021-05-25 (×2): 1 [IU] via SUBCUTANEOUS

## 2021-05-21 MED ORDER — INSULIN ASPART 100 UNIT/ML IJ SOLN: 0.0000 [IU] | Freq: Three times a day (TID) | INTRAMUSCULAR | Status: DC

## 2021-05-21 NOTE — Evaluation (Signed)
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 Pt presents with expected deficits in sitting/standing balance, pain, endurance, as well as confusion (unsure if this is typical for pt). Pt overall Min A for bed mobility, Mod A x 2 for sit to stand using RW though unable to sustain long. Pt able to demo scooting along bedside with min guard. Pt requires Setup for UB ADLs and up to Max A for LB ADLs at this time. Plan to progress lateral leans with ADLs and scoot transfers, as anticipate pt to progress quickly with these techniques. Recommend DC back to CIR for intensive therapies to maximize safety/independence.     Follow Up Recommendations  CIR    Equipment Recommendations  Tub/shower bench;Other (comment);3 in 1 bedside commode;Wheelchair (measurements OT);Wheelchair cushion (measurements OT) (rolling walker)    Recommendations for Other Services Rehab consult     Precautions / Restrictions Precautions Precautions: Fall Precaution Comments: L AKA Restrictions Weight Bearing Restrictions: Yes LLE Weight Bearing: Non weight bearing      Mobility Bed Mobility Overal bed mobility: Needs Assistance Bed Mobility: Supine to Sit;Sit to Supine     Supine to sit: HOB elevated;Min guard Sit to supine: Min assist   General bed mobility comments: min guard with increased time to get to EOB, cues to scoot hips forward and use bedrail. Assist to get LE back into bed and guide trunk safely    Transfers Overall transfer level: Needs assistance Equipment used: Rolling walker (2 wheeled) Transfers: Sit to/from Stand Sit to Stand:  Mod assist;+2 physical assistance;+2 safety/equipment         General transfer comment: Mod A x 2 for sit to stand transfer using RW. Pt unable to maintain standing longer than 5 seconds. Difficulty acheiving second sit to stand due to posterior bias and likely anxiety. Cues to avoid temptation to "put left foot down on ground" which would cause pt to lose balance    Balance Overall balance assessment: Needs assistance Sitting-balance support: Feet supported;Single extremity supported;Bilateral upper extremity supported Sitting balance-Leahy Scale: Poor Sitting balance - Comments: reliant on UE support initially with one posterior LOB - min guard to correct Postural control: Posterior lean Standing balance support: Bilateral upper extremity supported;During functional activity Standing balance-Leahy Scale: Poor Standing balance comment: reliant on UE support and external support                           ADL either performed or assessed with clinical judgement   ADL Overall ADL's : Needs assistance/impaired Eating/Feeding: Set up;Bed level   Grooming: Set up;Sitting   Upper Body Bathing: Set up;Sitting   Lower Body Bathing: Moderate assistance;Sitting/lateral leans;Bed level   Upper Body Dressing : Set up;Sitting   Lower Body Dressing: Moderate assistance;Sitting/lateral leans;Bed level       Toileting- Clothing Manipulation and Hygiene: Maximal assistance;Sitting/lateral lean         General ADL Comments: Pt with noted sitting/standing balance deficits, pain and confusion requiring increased assist for LB ADLs and transfers.     Vision Baseline Vision/History: Wears glasses Wears  Glasses: At all times Patient Visual Report: No change from baseline Vision Assessment?: No apparent visual deficits     Perception     Praxis      Pertinent Vitals/Pain Pain Assessment: 0-10 Pain Score: 8  Pain Location: L residual limb Pain Descriptors / Indicators:  Sore Pain Intervention(s): Monitored during session;Limited activity within patient's tolerance;RN gave pain meds during session     Hand Dominance Right   Extremity/Trunk Assessment Upper Extremity Assessment Upper Extremity Assessment: Generalized weakness   Lower Extremity Assessment Lower Extremity Assessment: Defer to PT evaluation   Cervical / Trunk Assessment Cervical / Trunk Assessment: Normal   Communication Communication Communication: HOH   Cognition Arousal/Alertness: Awake/alert;Lethargic Behavior During Therapy: Flat affect Overall Cognitive Status: Impaired/Different from baseline Area of Impairment: Orientation;Safety/judgement;Awareness;Problem solving;Following commands;Memory                 Orientation Level: Disoriented to;Place;Time   Memory: Decreased short-term memory Following Commands: Follows one step commands with increased time Safety/Judgement: Decreased awareness of safety Awareness: Intellectual Problem Solving: Slow processing;Difficulty sequencing;Requires verbal cues General Comments: Pt with intermittent confusion about location, able to answer he is at hospital but confused by timeline of rehab, surgery, etc. Slower processing and flat affect, increased time to follow directions but attempts all tasks. Cues for safety and sequencing   General Comments  VSS on RA. minor cues to avoid pulling at condom cath line    Exercises     Shoulder Instructions      Home Living Family/patient expects to be discharged to:: Private residence Living Arrangements: Spouse/significant other;Children Available Help at Discharge: Available PRN/intermittently;Family (reports son works off and on) Type of Home: House Home Access: Stairs to enter Secretary/administrator of Steps: 4 Entrance Stairs-Rails: Right;Left Home Layout: Multi-level Alternate Level Stairs-Number of Steps: Reports his bed/bath in downstairs, unable to recall number of steps    Bathroom Shower/Tub: Chief Strategy Officer: Standard   How Accessible: Accessible via walker Home Equipment: None   Additional Comments: History obtained from previous admission      Prior Functioning/Environment Level of Independence: Independent        Comments: Prior to initial BKA, pt was independent in all tasks        OT Problem List: Decreased strength;Decreased activity tolerance;Impaired balance (sitting and/or standing);Decreased safety awareness;Decreased knowledge of use of DME or AE;Decreased knowledge of precautions;Pain;Decreased cognition      OT Treatment/Interventions: Self-care/ADL training;DME and/or AE instruction;Therapeutic activities;Patient/family education;Balance training    OT Goals(Current goals can be found in the care plan section) Acute Rehab OT Goals Patient Stated Goal: less pain OT Goal Formulation: With patient Time For Goal Achievement: 06/04/21 Potential to Achieve Goals: Good  OT Frequency: Min 2X/week   Barriers to D/C:            Co-evaluation              AM-PAC OT "6 Clicks" Daily Activity     Outcome Measure Help from another person eating meals?: A Little Help from another person taking care of personal grooming?: A Little Help from another person toileting, which includes using toliet, bedpan, or urinal?: A Lot Help from another person bathing (including washing, rinsing, drying)?: A Lot Help from another person to put on and taking off regular upper body clothing?: A Little Help from another person to put on and taking off regular lower body clothing?: A Lot 6 Click Score: 15   End of Session Equipment Utilized During  Treatment: Gait belt;Rolling walker Nurse Communication: Mobility status  Activity Tolerance: Patient tolerated treatment well Patient left: in bed;with call bell/phone within reach;with bed alarm set;Other (comment) (fall mats)  OT Visit Diagnosis: Unsteadiness on feet  (R26.81);Other abnormalities of gait and mobility (R26.89);Muscle weakness (generalized) (M62.81);Pain Pain - Right/Left: Left Pain - part of body: Leg                Time: 3016-0109 OT Time Calculation (min): 20 min Charges:  OT General Charges $OT Visit: 1 Visit OT Evaluation $OT Eval Moderate Complexity: 1 Mod  Bradd Canary, OTR/L Acute Rehab Services Office: [phone]   Lorre Munroe 05/21/2021, 9:50 AM

## 2021-05-21 NOTE — Evaluation (Signed)
Physical Therapy Evaluation Patient Details Name: Todd Duran MRN: [id number] DOB: May 14, 1946 Today's Date: 05/21/2021   History of Present Illness  Patient is a 75 year old male with recent L BKA and discharge to CIR. 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  PMH includes DM, HTN  Clinical Impression  Pt presents to acute PT with decr mobility due to decr strength and balance associated with AKA. Expect pt will continue to make progress with transfers and can benefit from return to CIR. Expect emphasis on sliding board/scoot transfer will result in highest level of independence.     Follow Up Recommendations CIR    Equipment Recommendations  Wheelchair (measurements PT);Wheelchair cushion (measurements PT)    Recommendations for Other Services       Precautions / Restrictions Precautions Precautions: Fall Precaution Comments: L AKA Restrictions Weight Bearing Restrictions: Yes LLE Weight Bearing: Non weight bearing      Mobility  Bed Mobility Overal bed mobility: Needs Assistance Bed Mobility: Supine to Sit;Sit to Supine     Supine to sit: HOB elevated;Min assist Sit to supine: Min assist   General bed mobility comments: Assist to elevate trunk into sitting    Transfers Overall transfer level: Needs assistance Equipment used: None Transfers: Lateral/Scoot Transfers Sit to Stand: Mod assist;+2 physical assistance;+2 safety/equipment        Lateral/Scoot Transfers: Min assist General transfer comment: Pt scooted bed to recliner to bed with min assist using bed pad under pt to assist scooting  Ambulation/Gait                Stairs            Wheelchair Mobility    Modified Rankin (Stroke Patients Only)       Balance Overall balance assessment: Needs assistance Sitting-balance support: Feet supported;No upper extremity supported Sitting balance-Leahy Scale: Fair Sitting balance - Comments: reliant on  UE support initially with one posterior LOB - min guard to correct Postural control: Posterior lean Standing balance support: Bilateral upper extremity supported;During functional activity Standing balance-Leahy Scale: Poor Standing balance comment: reliant on UE support and external support                             Pertinent Vitals/Pain Pain Assessment: Faces Pain Score: 8  Faces Pain Scale: Hurts even more Pain Location: L residual limb Pain Descriptors / Indicators: Sore;Grimacing;Guarding Pain Intervention(s): Monitored during session;Repositioned    Home Living Family/patient expects to be discharged to:: Private residence Living Arrangements: Spouse/significant other;Children Available Help at Discharge: Available PRN/intermittently;Family (reports son works off and on) Type of Home: House Home Access: Stairs to enter Entrance Stairs-Rails: Doctor, general practice of Steps: 4 Home Layout: Multi-level Home Equipment: None Additional Comments: History obtained from previous admission    Prior Function Level of Independence: Independent         Comments: Prior to initial BKA, pt was independent in all tasks     Hand Dominance   Dominant Hand: Right    Extremity/Trunk Assessment   Upper Extremity Assessment Upper Extremity Assessment: Defer to OT evaluation    Lower Extremity Assessment Lower Extremity Assessment: Generalized weakness;LLE deficits/detail LLE Deficits / Details: Lt AKA    Cervical / Trunk Assessment Cervical / Trunk Assessment: Normal  Communication   Communication: HOH  Cognition Arousal/Alertness: Awake/alert;Lethargic Behavior During Therapy: Flat affect Overall Cognitive Status: Impaired/Different from baseline Area of Impairment: Orientation;Safety/judgement;Awareness;Problem solving;Following  commands;Memory                 Orientation Level: Disoriented to;Place;Time   Memory: Decreased short-term  memory Following Commands: Follows one step commands with increased time Safety/Judgement: Decreased awareness of safety Awareness: Intellectual Problem Solving: Slow processing;Difficulty sequencing;Requires verbal cues General Comments: Pt with intermittent confusion about location, able to answer he is at hospital but confused by timeline of rehab, surgery, etc. Slower processing and flat affect, increased time to follow directions but attempts all tasks. Cues for safety and sequencing      General Comments General comments (skin integrity, edema, etc.): VSS on RA    Exercises     Assessment/Plan    PT Assessment Patient needs continued PT services  PT Problem List Decreased strength;Decreased mobility;Decreased balance;Decreased activity tolerance;Decreased knowledge of precautions;Decreased knowledge of use of DME       PT Treatment Interventions DME instruction;Gait training;Functional mobility training;Therapeutic activities;Balance training;Patient/family education;Wheelchair mobility training    PT Goals (Current goals can be found in the Care Plan section)  Acute Rehab PT Goals Patient Stated Goal: less pain PT Goal Formulation: With patient Time For Goal Achievement: 06/04/21 Potential to Achieve Goals: Good    Frequency Min 3X/week   Barriers to discharge        Co-evaluation               AM-PAC PT "6 Clicks" Mobility  Outcome Measure Help needed turning from your back to your side while in a flat bed without using bedrails?: A Little Help needed moving from lying on your back to sitting on the side of a flat bed without using bedrails?: A Little Help needed moving to and from a bed to a chair (including a wheelchair)?: A Little Help needed standing up from a chair using your arms (e.g., wheelchair or bedside chair)?: Total Help needed to walk in hospital room?: Total Help needed climbing 3-5 steps with a railing? : Total 6 Click Score: 12    End of  Session   Activity Tolerance: Patient tolerated treatment well Patient left: in bed;with bed alarm set;with call bell/phone within reach Nurse Communication: Mobility status PT Visit Diagnosis: Other abnormalities of gait and mobility (R26.89);Muscle weakness (generalized) (M62.81)    Time: 1010-1043 PT Time Calculation (min) (ACUTE ONLY): 33 min   Charges:   PT Evaluation $PT Eval Moderate Complexity: 1 Mod PT Treatments $Therapeutic Activity: 8-22 mins        [facility] PT Acute Rehabilitation Services Pager [phone] Office [phone]   Angelina Ok [facility] 05/21/2021, 11:40 AM

## 2021-05-21 NOTE — PMR Pre-admission (Signed)
PMR Admission Coordinator Pre-Admission Assessment  Patient: Todd Duran is an 75 y.o., male MRN: [id number] DOB: 1945-11-21 Height: _0  (180.3 cm) Weight: 55.7 kg  Insurance Information HMO:     PPO:      PCP:      IPA:      80/20:      OTHER:  PR[facility]: [facility]      Policy#: [id number]      Subscriber: pt CM Name: Rubin Payor      Phone#: [phone]     Fax#: [phone] Pre-Cert#: [id number]    approved for 30 days  Employer:  Benefits:  Phone #: [phone]     Name:  Eff. Date: active     Deduct:       Out of Pocket Max:       Life Max:  CIR: per [facility] contract       SECONDARY:       Policy#:      Phone#:   Development worker, community:       Phone#:   The Engineer, petroleum" for patients in Inpatient Rehabilitation Facilities with attached "Privacy Act 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 right-handed male with history of hypertension as well as tobacco alcohol use.  Lives with spouse independent prior to admission.  Spouse works during the day.  Presented 05/13/2021 with progressive left foot pain x2 months with ischemic changes and findings of decreased femoral pulses bilaterally.  Underwent ultrasound-guided left femoral artery with abdominal aortogram showing bilateral renal artery stenosis.  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  Limb was not felt to be salvageable.  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 Trula Slade.  Hospital course anemia 8.5 as well as hyponatremia 130.  Cleared to begin heparin for DVT prophylaxis.Admitted to CIR on 05/15/2021.  Noted boggy necrotic area concerning along incision  line with vascular surgeon notified. A few staples were removed noted underlying abscess with poor healing and increased necrosis. 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 Postoperative pain management as well and monitoring of Hgb .  Patient's medical record from [facility]  has been reviewed by the rehabilitation admission coordinator and physician.  Past Medical History  Past Medical History:  Diagnosis Date   Diabetes mellitus without complication ([place])    Hypertension     Family History   family history is not on file.  Prior Rehab/Hospitalizations Has the patient had prior rehab or hospitalizations prior to admission? Yes  CIR 7/8 until 05/20/21 when readmitted to acute for AKA  Has the patient had major surgery during 100 days prior to admission? Yes   Current Medications  Current Facility-Administered Medications:    alum & mag hydroxide-simeth (MAALOX/MYLANTA) 200-200-20 MG/5ML suspension 15-30 mL, 15-30 mL, Oral, Q2H PRN, Rhyne, Samantha J, PA-C   amLODipine (NORVASC) tablet 10 mg, 10 mg, Oral, Daily, Marty Heck, MD, 10 mg at 05/25/21 0955   aspirin EC tablet 81 mg, 81 mg, Oral, Daily, Rhyne, Samantha J, PA-C, 81 mg at 05/25/21 0954   atenolol (TENORMIN) tablet 25 mg, 25 mg, Oral,  BID, Gabriel Earing, PA-C, 25 mg at 05/25/21 0954   atorvastatin (LIPITOR) tablet 20 mg, 20 mg, Oral, QHS, Rhyne, Samantha J, PA-C, 20 mg at 05/24/21 2024   docusate sodium (COLACE) capsule 100 mg, 100 mg, Oral, Daily, Rhyne, Samantha J, PA-C, 100 mg at 57/01/77 9390   folic acid (FOLVITE) tablet 1 mg, 1 mg, Oral, Daily, Rhyne, Samantha J, PA-C, 1 mg at 05/25/21 0954   guaiFENesin-dextromethorphan (ROBITUSSIN DM) 100-10 MG/5ML syrup 15 mL, 15 mL, Oral, Q4H PRN, Rhyne, Samantha J, PA-C   heparin injection 5,000 Units, 5,000 Units, Subcutaneous, Q8H, Rhyne, Samantha J, PA-C, 5,000 Units at 05/25/21 0630   hydrALAZINE (APRESOLINE) injection  5 mg, 5 mg, Intravenous, Q20 Min PRN, Rhyne, Samantha J, PA-C   insulin aspart (novoLOG) injection 0-9 Units, 0-9 Units, Subcutaneous, TID WC, Marty Heck, MD, 1 Units at 05/25/21 1234   labetalol (NORMODYNE) injection 10 mg, 10 mg, Intravenous, Q10 min PRN, Rhyne, Samantha J, PA-C   lactated ringers infusion, , Intravenous, Continuous, Stoltzfus, March Rummage, DO, Last Rate: 10 mL/hr at 05/22/21 1521, Restarted at 05/22/21 1521   magnesium sulfate IVPB 2 g 50 mL, 2 g, Intravenous, Daily PRN, Rhyne, Samantha J, PA-C   metoprolol tartrate (LOPRESSOR) injection 2-5 mg, 2-5 mg, Intravenous, Q2H PRN, Rhyne, Samantha J, PA-C   morphine 2 MG/ML injection 2 mg, 2 mg, Intravenous, Q2H PRN, Rhyne, Samantha J, PA-C   multivitamin with minerals tablet 1 tablet, 1 tablet, Oral, Daily, Rhyne, Samantha J, PA-C, 1 tablet at 05/25/21 0954   ondansetron (ZOFRAN) injection 4 mg, 4 mg, Intravenous, Q6H PRN, Rhyne, Samantha J, PA-C   oxybutynin (DITROPAN) tablet 2.5 mg, 2.5 mg, Oral, BID, Rhyne, Samantha J, PA-C, 2.5 mg at 05/25/21 1005   oxyCODONE-acetaminophen (PERCOCET/ROXICET) 5-325 MG per tablet 1-2 tablet, 1-2 tablet, Oral, Q4H PRN, Rhyne, Samantha J, PA-C, 2 tablet at 05/25/21 1234   phenol (CHLORASEPTIC) mouth spray 1 spray, 1 spray, Mouth/Throat, PRN, Rhyne, Samantha J, PA-C   polyethylene glycol (MIRALAX / GLYCOLAX) packet 17 g, 17 g, Oral, Daily PRN, Rhyne, Samantha J, PA-C   potassium chloride SA (KLOR-CON) CR tablet 20-40 mEq, 20-40 mEq, Oral, Daily PRN, Rhyne, Samantha J, PA-C   thiamine tablet 100 mg, 100 mg, Oral, Daily, Rhyne, Samantha J, PA-C, 100 mg at 05/25/21 3009  Patients Current Diet:  Diet Order             Diet Heart Room service appropriate? Yes; Fluid consistency: Thin  Diet effective now                   Precautions / Restrictions Precautions Precautions: Fall Precaution Comments: L AKA Other Brace: shrinker, limb guard (limb guard not in room during PT session 7/18  and may be from previous surgery, pt new AKA ace wrapped no visible drainage) Restrictions Weight Bearing Restrictions: Yes LLE Weight Bearing: Non weight bearing   Has the patient had 2 or more falls or a fall with injury in the past year? No  Prior Activity Level Community (5-7x/wk): Mod I with RW  Prior Functional Level Self Care: Did the patient need help bathing, dressing, using the toilet or eating? Independent  Indoor Mobility: Did the patient need assistance with walking from room to room (with or without device)? Independent  Stairs: Did the patient need assistance with internal or external stairs (with or without device)? Independent  Functional Cognition: Did the patient need help planning regular tasks such as shopping or remembering to take medications?  Independent  Home Assistive Devices / Equipment Home Assistive Devices/Equipment: None Home Equipment: None  Prior Device Use: Indicate devices/aids used by the patient prior to current illness, exacerbation or injury? Walker  Current Functional Level Cognition  Arousal/Alertness: Awake/alert Overall Cognitive Status: Impaired/Different from baseline Orientation Level: Oriented X4 Following Commands: Follows one step commands with increased time, Follows multi-step commands inconsistently Safety/Judgement: Decreased awareness of safety, Decreased awareness of deficits General Comments: Pt internally distracted this date due to discomfort (at sacrum or under L hip? difficult to tell based on pt report), pt needs some repetition of 1-step cues and encouraged not to pull on condom cath line (pt somewhat restless), chair alarm on for safety and pt family entering room at end of session.    Extremity Assessment (includes Sensation/Coordination)  Upper Extremity Assessment: Defer to OT evaluation  Lower Extremity Assessment: Generalized weakness, LLE deficits/detail LLE Deficits / Details: Lt AKA    ADLs  Overall ADL's  : Needs assistance/impaired Eating/Feeding: Set up, Bed level Grooming: Set up, Sitting Upper Body Bathing: Set up, Sitting Lower Body Bathing: Moderate assistance, Sitting/lateral leans, Bed level Upper Body Dressing : Set up, Sitting Lower Body Dressing: Moderate assistance, Sitting/lateral leans, Bed level Toileting- Clothing Manipulation and Hygiene: Maximal assistance, Sitting/lateral lean General ADL Comments: Pt with noted sitting/standing balance deficits, pain and confusion requiring increased assist for LB ADLs and transfers.    Mobility  Overal bed mobility: Needs Assistance Bed Mobility: Supine to Sit, Sit to Supine Supine to sit: Mod assist, +2 for safety/equipment, HOB elevated Sit to supine: Max assist General bed mobility comments: Assist to elevate trunk into sitting, use of bed features/rail    Transfers  Overall transfer level: Needs assistance Equipment used: Rolling walker (2 wheeled) Transfer via Lift Equipment: Stedy Transfers: Sit to/from [employer] to Stand: Min assist, +2 physical assistance, +2 safety/equipment Stand pivot transfers:  (utilized [employer] for safety with pivot due to decreased focus/restlessness so totalA) Squat pivot transfers: Max assist  Lateral/Scoot Transfers: Min assist General transfer comment: PT provides knee block and cues for BUE support, cues for rocking; pt able to stand to/from [place] x2 for transfer to recliner, then able to stand from recliner to RW with +78mnA with poor standing posture ~30 sec.    Ambulation / Gait / Stairs / WOffice manager/ Balance Dynamic Sitting Balance Sitting balance - Comments: reliant on UE support, pt restless and tending to lean multiple directions but no overt LOB with min guard/Supervision Balance Overall balance assessment: Needs assistance Sitting-balance support: Feet supported, Single extremity supported, Bilateral upper extremity supported Sitting balance-Leahy Scale:  Poor Sitting balance - Comments: reliant on UE support, pt restless and tending to lean multiple directions but no overt LOB with min guard/Supervision Postural control: Posterior lean Standing balance support: Bilateral upper extremity supported Standing balance-Leahy Scale: Poor Standing balance comment: modA with knee block and BUE support at RW ~30 sec    Special needs/care consideration ETOH abuse pta long term   Previous Home Environment  Living Arrangements: Spouse/significant other  Lives With: Spouse, Family Available Help at Discharge: Available 24 hours/day (wife to take fmla) Type of Home: House Home Layout: Multi-level Alternate Level Stairs-Number of Steps: Reports his bed/bath in downstairs, unable to recall number of steps Home Access: Stairs to enter Entrance Stairs-Rails: Right, Left Entrance Stairs-Number of Steps: 4 Bathroom Shower/Tub: TChiropodist Standard Bathroom Accessibility: Yes How Accessible: Accessible via walker Home Care Services: No  Additional Comments: History obtained from previous admission  Discharge Living Setting Plans for Discharge Living Setting: Patient's home, Lives with (comment) Type of Home at Discharge: House Discharge Home Layout: Multi-level Alternate Level Stairs-Number of Steps: 4 steps to bedroom Discharge Home Access: Stairs to enter Entrance Stairs-Rails: Right, Left, Can reach both Entrance Stairs-Number of Steps: 4 Discharge Bathroom Shower/Tub: Tub/shower unit Discharge Bathroom Toilet: Standard Discharge Bathroom Accessibility: Yes How Accessible: Accessible via walker Does the patient have any problems obtaining your medications?: No  Social/Family/Support Systems Patient Roles: Spouse Contact Information: wife Anticipated Caregiver: Wife Anticipated Caregiver's Contact Information: see above Ability/Limitations of Caregiver: wife taking fmla as needed Caregiver Availability: 24/7 Discharge  Plan Discussed with Primary Caregiver: Yes Is Caregiver In Agreement with Plan?: Yes Does Caregiver/Family have Issues with Lodging/Transportation while Pt is in Rehab?: No  Goals Patient/Family Goal for Rehab: Mod I to superivsion with PT and OT Expected length of stay: 10-14 days Pt/Family Agrees to Admission and willing to participate: Yes Program Orientation Provided & Reviewed with Pt/Caregiver Including Roles  & Responsibilities: Yes  Decrease burden of Care through IP rehab admission: n/a  Possible need for SNF placement upon discharge: not anticipated  Patient Condition: I have reviewed medical records from [facility] , spoken with CM, and patient and spouse. I met with patient at the bedside for inpatient rehabilitation assessment.  Patient will benefit from ongoing PT and OT, can actively participate in 3 hours of therapy a day 5 days of the week, and can make measurable gains during the admission.  Patient will also benefit from the coordinated team approach during an Inpatient Acute Rehabilitation admission.  The patient will receive intensive therapy as well as Rehabilitation physician, nursing, social worker, and care management interventions.  Due to bladder management, bowel management, safety, skin/wound care, disease management, medication administration, pain management, and patient education the patient requires 24 hour a day rehabilitation nursing.  The patient is currently mod assist overall with mobility and basic ADLs.  Discharge setting and therapy post discharge at home with home health is anticipated.  Patient has agreed to participate in the Acute Inpatient Rehabilitation Program and will admit today.  Preadmission Screen Completed By:  Cleatrice Burke, 05/25/2021 1:38 PM ______________________________________________________________________   Discussed status with Dr. Naaman Plummer on  05/25/2021 at 1338 and received approval for admission today.  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 Yes Co-Morbidities requiring supervision/potential complications: htn, dm, pad Due to bladder management, bowel management, safety, skin/wound care, disease management, medication administration, pain management, and patient education, does the patient require 24 hr/day rehab nursing? Yes Does the patient require coordinated care of a physician, rehab nurse, PT, OT to address physical and functional deficits in the context of the above medical diagnosis(es)? Yes Addressing deficits in the following areas: balance, endurance, locomotion, strength, transferring, bowel/bladder control, bathing, dressing, feeding, grooming, toileting, and psychosocial support Can the patient actively participate in an intensive therapy program of at least 3 hrs of therapy 5 days a week? Yes The potential for patient to make measurable gains while on inpatient rehab is excellent Anticipated functional outcomes upon discharge from inpatient rehab: modified independent and supervision PT, modified independent and supervision OT, n/a  SLP Estimated rehab length of stay to reach the above functional goals is: 10-14 days Anticipated discharge destination: Home 10. Overall Rehab/Functional Prognosis: excellent   MD Signature: [name], MD, [facility] 05/25/2021

## 2021-05-21 NOTE — Progress Notes (Signed)
Inpatient Rehabilitation Care Coordinator Discharge Note  The overall goal for the admission was met for:   Discharge location: Yes. D/c to acute for Lt BKA revision  Length of Stay: Yes. 4 days  Discharge activity level: Yes. Min A to total A  Home/community participation: Yes. Limited  Services provided included: MD, RD, PT, OT, SLP, RN, CM, TR, Pharmacy, Neuropsych, and SW  Financial Services: Private Insurance: [place]  Choices offered to/list presented to:N/A  Follow-up services arranged: N/A  Comments (or additional information):  Patient/Family verbalized understanding of follow-up arrangements:   Individual responsible for coordination of the follow-up plan: pt wife Luellen Pucker [phone]  Confirmed correct DME delivered: Rana Snare 05/21/2021    Rana Snare

## 2021-05-21 NOTE — H&P (Signed)
H&P    MRN #:  [id number]  History of Present Illness: This is a 75 y.o. male with DM and HTN that vascular was asked to evaluate non-healing left BKA.  Done by Dr. Myra Gianotti 05/13/21.  Elevated WBC count.  Past Medical History:  Diagnosis Date   Diabetes mellitus without complication (H[facility])    Hypertension     Past Surgical History:  Procedure Laterality Date   ABDOMINAL AORTOGRAM W/LOWER EXTREMITY N/A 05/12/2021   Procedure: ABDOMINAL AORTOGRAM W/LOWER EXTREMITY;  Surgeon: Nada Libman, MD;  Location: [facility] INVASIVE CV LAB;  Service: Cardiovascular;  Laterality: N/A;   AMPUTATION Left 05/13/2021   Procedure: AMPUTATION BELOW KNEE LEFT ;  Surgeon: Nada Libman, MD;  Location: [facility] OR;  Service: Vascular;  Laterality: Left;   HERNIA REPAIR     KNEE SURGERY      Allergies  Allergen Reactions   Lisinopril Swelling    Swelling of lips    Prior to Admission medications   Medication Sig Start Date End Date Taking? Authorizing Provider  amLODipine (NORVASC) 10 MG tablet Take 10 mg by mouth daily.   Yes [provider]  aspirin 325 MG tablet Take 325 mg by mouth daily.   Yes [provider]  atenolol (TENORMIN) 100 MG tablet Take 100 mg by mouth daily.   Yes [provider]  atorvastatin (LIPITOR) 20 MG tablet Take 20 mg by mouth at bedtime.   Yes [provider]  cyanocobalamin 1000 MCG tablet Take 1,000 mcg by mouth daily.   Yes [provider]  hydrochlorothiazide (HYDRODIURIL) 25 MG tablet Take 25 mg by mouth daily.   Yes [provider]  metFORMIN (GLUCOPHAGE) 500 MG tablet Take 500 mg by mouth daily with breakfast.   Yes [provider]  methocarbamol (ROBAXIN) 750 MG tablet Take 750 mg by mouth 3 (three) times daily.   Yes [provider]  oxybutynin (DITROPAN) 5 MG tablet Take 0.5 tablets (2.5 mg total) by mouth 2 (two) times daily. 05/19/21  Yes Angiulli, Mcarthur Rossetti, PA-C    Social History   Socioeconomic  History   Marital status: Married    Spouse name: Not on file   Number of children: Not on file   Years of education: Not on file   Highest education level: Not on file  Occupational History   Not on file  Tobacco Use   Smoking status: Every Day    Packs/day: 0.50    Types: Cigarettes   Smokeless tobacco: Never  Vaping Use   Vaping Use: Never used  Substance and Sexual Activity   Alcohol use: Not on file   Drug use: Not on file   Sexual activity: Not on file  Other Topics Concern   Not on file  Social History Narrative   Not on file   Social Determinants of Health   Financial Resource Strain: Not on file  Food Insecurity: Not on file  Transportation Needs: Not on file  Physical Activity: Not on file  Stress: Not on file  Social Connections: Not on file  Intimate Partner Violence: Not on file     History reviewed. No pertinent family history.  ROS: [x]  Positive   [ ]  Negative   [ ]  All sytems reviewed and are negative  Cardiovascular: []  chest pain/pressure []  palpitations []  SOB lying flat []  DOE []  pain in legs while walking []  pain in legs at rest []  pain in legs at night []  non-healing ulcers []  hx  of DVT []  swelling in legs  Pulmonary: []  productive cough []  asthma/wheezing []  home O2  Neurologic: []  weakness in []  arms []  legs []  numbness in []  arms []  legs []  hx of CVA []  mini stroke [] difficulty speaking or slurred speech []  temporary loss of vision in one eye []  dizziness  Hematologic: []  hx of cancer []  bleeding problems []  problems with blood clotting easily  Endocrine:   []  diabetes []  thyroid disease  GI []  vomiting blood []  blood in stool  GU: []  CKD/renal failure []  HD--[]  M/W/F or []  T/T/S []  burning with urination []  blood in urine  Psychiatric: []  anxiety []  depression  Musculoskeletal: []  arthritis []  joint pain  Integumentary: []  rashes []  ulcers  Constitutional: []  fever []  chills   Physical  Examination  Vitals:   05/21/21 0300 05/21/21 0808  BP: 127/75 133/88  Pulse: 91 88  Resp: 13 12  Temp: (!) 97.5 F (36.4 C) 98.1 F (36.7 C)  SpO2: 99% 100%   Body mass index is 17.13 kg/m.  General:  NAD Gait: Not observed HENT: WNL, normocephalic Pulmonary: normal non-labored breathing, without Rales, rhonchi,  wheezing Cardiac: regular, without  Murmurs, rubs or gallops Vascular Exam/Pulses: Left BKA necrotic   CBC    Component Value Date/Time   WBC 15.0 (H) 05/21/2021 0127   RBC 2.72 (L) 05/21/2021 0127   HGB 7.3 (L) 05/21/2021 0127   HCT 23.0 (L) 05/21/2021 0127   PLT 474 (H) 05/21/2021 0127   MCV 84.6 05/21/2021 0127   MCH 26.8 05/21/2021 0127   MCHC 31.7 05/21/2021 0127   RDW 15.3 05/21/2021 0127   LYMPHSABS 0.7 05/18/2021 0712   MONOABS 1.0 05/18/2021 0712   EOSABS 0.0 05/18/2021 0712   BASOSABS 0.0 05/18/2021 0712    BMET    Component Value Date/Time   NA 129 (L) 05/21/2021 0127   K 3.7 05/21/2021 0127   CL 96 (L) 05/21/2021 0127   CO2 25 05/21/2021 0127   GLUCOSE 119 (H) 05/21/2021 0127   BUN 40 (H) 05/21/2021 0127   CREATININE 0.99 05/21/2021 0127   CALCIUM 8.8 (L) 05/21/2021 0127   GFRNONAA >60 05/21/2021 0127    COAGS: No results found for: INR, PROTIME   Non-Invasive Vascular Imaging:       ASSESSMENT/PLAN: This is a 75 y.o. male e status post left below-knee amputation by Dr. .  Vascular surgery was called last night to reevaluate the stump.  I did remove some staples hoping to drain an underlying abscess but really did not drain any significant fluid and what I could see of the wound appears frankly necrotic now.  I think he needs conversion to above-knee amputation and I do not think he has any chance of healing.  I will tentatively posted for tomorrow Wednesday.  Please keep n.p.o. after midnight.    , MD [facility] [place] Office: [phone]

## 2021-05-21 NOTE — Progress Notes (Addendum)
  Progress Note    05/21/2021 7:31 AM 1 Day Post-Op  Subjective:  says his leg feels better  Afebrile  Vitals:   05/20/21 2300 05/21/21 0300  BP: 103/65 127/75  Pulse: 77 91  Resp: 20 13  Temp: 97.6 F (36.4 C) (!) 97.5 F (36.4 C)  SpO2: 97% 99%    Physical Exam: Incisions:  dressing on stump clean and dry   CBC    Component Value Date/Time   WBC 15.0 (H) 05/21/2021 0127   RBC 2.72 (L) 05/21/2021 0127   HGB 7.3 (L) 05/21/2021 0127   HCT 23.0 (L) 05/21/2021 0127   PLT 474 (H) 05/21/2021 0127   MCV 84.6 05/21/2021 0127   MCH 26.8 05/21/2021 0127   MCHC 31.7 05/21/2021 0127   RDW 15.3 05/21/2021 0127   LYMPHSABS 0.7 05/18/2021 0712   MONOABS 1.0 05/18/2021 0712   EOSABS 0.0 05/18/2021 0712   BASOSABS 0.0 05/18/2021 0712    BMET    Component Value Date/Time   NA 129 (L) 05/21/2021 0127   K 3.7 05/21/2021 0127   CL 96 (L) 05/21/2021 0127   CO2 25 05/21/2021 0127   GLUCOSE 119 (H) 05/21/2021 0127   BUN 40 (H) 05/21/2021 0127   CREATININE 0.99 05/21/2021 0127   CALCIUM 8.8 (L) 05/21/2021 0127   GFRNONAA >60 05/21/2021 0127    INR No results found for: INR   Intake/Output Summary (Last 24 hours) at 05/21/2021 0731 Last data filed at 05/20/2021 1709 Gross per 24 hour  Intake 820 ml  Output 100 ml  Net 720 ml     Assessment/Plan:  75 y.o. male is s/p left above knee amputation  1 Day Post-Op  -pt doing well this morning.  Will take down dressing tomorrow.  -should be ready to go back to CIR tomorrow.  Per CIR admissions, will need reauthorization from the [place].  TOC consulted for this.    Doreatha Massed, PA-C [facility] [phone] 05/21/2021 7:31 AM  I have seen and evaluated the patient. I agree with the PA note as documented above.  Postop day 1 status post left above-knee amputation for necrotic left below-knee amputation.  Dressing is clean and dry.  We will remove dressing tomorrow.  White count improved to 15 from 21.   States his pain is controlled  Hgb 8.2 --> 7.3.  Hope to get him back to rehab. Dressing will be removed tomorrow.  Cephus Shelling, MD [facility] [place] Office: [phone]

## 2021-05-21 NOTE — Progress Notes (Signed)
Inpatient Rehabilitation Admissions Coordinator   I met with patient at bedside and spoke with his wife by phone. Both prefer readmit to [facility] once medically ready. I will begin Auth with the [facility] today.  Danne Baxter, RN, MSN Rehab Admissions Coordinator [phone] 05/21/2021 1:05 PM +

## 2021-05-21 NOTE — Anesthesia Postprocedure Evaluation (Signed)
Anesthesia Post Note  Patient: Todd Duran  Procedure(s) Performed: AMPUTATION ABOVE KNEE (Left: Knee)     Patient location during evaluation: PACU Anesthesia Type: Regional and General Level of consciousness: awake and alert Pain management: pain level controlled Vital Signs Assessment: post-procedure vital signs reviewed and stable Respiratory status: spontaneous breathing, nonlabored ventilation, respiratory function stable and patient connected to nasal cannula oxygen Cardiovascular status: blood pressure returned to baseline and stable Postop Assessment: no apparent nausea or vomiting Anesthetic complications: no   No notable events documented.  Last Vitals:  Vitals:   05/21/21 1900 05/21/21 2200  BP: 122/73 121/74  Pulse: 89 85  Resp: 16   Temp: 36.8 C   SpO2: 100% 97%    Last Pain:  Vitals:   05/21/21 2000  TempSrc:   PainSc: 8                  Bretton Tandy P Carlee Tesfaye

## 2021-05-22 ENCOUNTER — Other Ambulatory Visit: Payer: Self-pay

## 2021-05-22 LAB — BASIC METABOLIC PANEL
Anion gap: 5 (ref 5–15)
BUN: 34 mg/dL — ABNORMAL HIGH (ref 8–23)
CO2: 27 mmol/L (ref 22–32)
Calcium: 8.7 mg/dL — ABNORMAL LOW (ref 8.9–10.3)
Chloride: 97 mmol/L — ABNORMAL LOW (ref 98–111)
Creatinine, Ser: 0.88 mg/dL (ref 0.61–1.24)
GFR, Estimated: >60 mL/min (ref >60)
Glucose, Bld: 112 mg/dL — ABNORMAL HIGH (ref 70–99)
Potassium: 4 mmol/L (ref 3.5–5.1)
Sodium: 129 mmol/L — ABNORMAL LOW (ref 135–145)

## 2021-05-22 LAB — CBC
HCT: 21.1 % — ABNORMAL LOW (ref 39.0–52.0)
Hemoglobin: 6.7 g/dL — CL (ref 13.0–17.0)
MCH: 27.1 pg (ref 26.0–34.0)
MCHC: 31.8 g/dL (ref 30.0–36.0)
MCV: 85.4 fL (ref 80.0–100.0)
Platelets: 455 10*3/uL — ABNORMAL HIGH (ref 150–400)
RBC: 2.47 MIL/uL — ABNORMAL LOW (ref 4.22–5.81)
RDW: 15.3 % (ref 11.5–15.5)
WBC: 9.1 10*3/uL (ref 4.0–10.5)
nRBC: 0.2 % (ref 0.0–0.2)

## 2021-05-22 LAB — HEMOGLOBIN AND HEMATOCRIT, BLOOD
HCT: 27.8 % — ABNORMAL LOW (ref 39.0–52.0)
Hemoglobin: 9.1 g/dL — ABNORMAL LOW (ref 13.0–17.0)

## 2021-05-22 LAB — GLUCOSE, CAPILLARY
Glucose-Capillary: 124 mg/dL — ABNORMAL HIGH (ref 70–99)
Glucose-Capillary: 126 mg/dL — ABNORMAL HIGH (ref 70–99)
Glucose-Capillary: 106 mg/dL — ABNORMAL HIGH (ref 70–99)
Glucose-Capillary: 164 mg/dL — ABNORMAL HIGH (ref 70–99)

## 2021-05-22 LAB — PREPARE RBC (CROSSMATCH)

## 2021-05-22 NOTE — Discharge Summary (Addendum)
Discharge Summary    Todd Duran 10-17-1946 75 y.o. male  [id number]  Admission Date: 05/20/2021  Discharge Date: 05/25/2021  Physician: Cephus Shelling, MD  Admission Diagnosis: Non-healing amputation site [facility]) [T87.89]   HPI:   This is a 75 y.o. male with DM and HTN that vascular was asked to evaluate non-healing left BKA.  Done by Dr. Myra Gianotti 05/13/21.  Elevated WBC count.  Hospital Course:  The patient was admitted to the hospital and taken to the operating room on 05/20/2021 and underwent: Left AKA    The pt tolerated the procedure well and was transported to the PACU in good condition.   On POD 2, dressing was removed and incision looked good with staples in tact.  He did have acute blood loss anemia and was transfused 2 units of PRBC's.  His hgb was up to 9.1 from 6.7.  POD 3, pt doing well and incision looks good.  Anticipate dc back to CIR tomorrow.   POD 5, pt doing quite well without pain.  Mild bloody drainage on bandage but no active drainage.  CBC requested by [employer].    CBC    Component Value Date/Time   WBC 9.1 05/22/2021 0135   RBC 2.47 (L) 05/22/2021 0135   HGB 9.1 (L) 05/22/2021 2214   HCT 27.8 (L) 05/22/2021 2214   PLT 455 (H) 05/22/2021 0135   MCV 85.4 05/22/2021 0135   MCH 27.1 05/22/2021 0135   MCHC 31.8 05/22/2021 0135   RDW 15.3 05/22/2021 0135   LYMPHSABS 0.7 05/18/2021 0712   MONOABS 1.0 05/18/2021 0712   EOSABS 0.0 05/18/2021 0712   BASOSABS 0.0 05/18/2021 0712   BMET    Component Value Date/Time   NA 129 (L) 05/22/2021 0135   K 4.0 05/22/2021 0135   CL 97 (L) 05/22/2021 0135   CO2 27 05/22/2021 0135   GLUCOSE 112 (H) 05/22/2021 0135   BUN 34 (H) 05/22/2021 0135   CREATININE 0.88 05/22/2021 0135   CALCIUM 8.7 (L) 05/22/2021 0135   GFRNONAA >60 05/22/2021 0135      Discharge Instructions     Discharge patient   Complete by: As directed    Discharge back to CIR once insurance has been approved.   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   aspirin 81 MG EC tablet Take 1 tablet (81 mg total) by mouth daily. Swallow whole. What changed: Another medication with the same name was removed. Continue taking this medication, and follow the directions you see here.   atenolol 25 MG tablet Commonly known as: TENORMIN Take 1 tablet (25 mg total) by mouth 2 (two) times daily. What changed: Another medication with the same name was removed. Continue taking  this medication, and follow the directions you see here.   atorvastatin 20 MG tablet Commonly known as: LIPITOR Take 20 mg by mouth at bedtime.   cyanocobalamin 1000 MCG tablet Take 1,000 mcg by mouth daily.   docusate sodium 100 MG capsule Commonly known as: COLACE Take 1 capsule (100 mg total) by mouth daily.   folic acid 1 MG tablet Commonly known as: FOLVITE Take 1 tablet (1 mg total) by mouth daily.   hydrochlorothiazide 25 MG tablet Commonly known as: HYDRODIURIL Take 25 mg by mouth daily.   metFORMIN 500 MG tablet Commonly known as: GLUCOPHAGE Take 500 mg by mouth daily with breakfast.   methocarbamol 750 MG tablet Commonly known as: ROBAXIN Take 750 mg by mouth 3 (three) times daily.   multivitamin with minerals Tabs tablet Take 1  tablet by mouth daily.   oxybutynin 5 MG tablet Commonly known as: DITROPAN Take 0.5 tablets (2.5 mg total) by mouth 2 (two) times daily.   oxyCODONE-acetaminophen 5-325 MG tablet Commonly known as: PERCOCET/ROXICET Take 1 tablet by mouth every 6 (six) hours as needed for moderate pain.   polyethylene glycol 17 g packet Commonly known as: MIRALAX / GLYCOLAX Take 17 g by mouth daily as needed for mild constipation.   thiamine 100 MG tablet Take 1 tablet (100 mg total) by mouth daily.        Prescriptions given: None given  Disposition: CIR  Patient's condition: is Good  Follow up: 1. [facility] in 4 weeks   Doreatha Massed, [place] [facility] [phone] 05/23/2021  9:36 AM

## 2021-05-22 NOTE — Progress Notes (Signed)
Inpatient Rehabilitation Admissions Coordinator   [facility] MD will not approve patient to return to CIR today POD # 2 due to acute anemia needing transfusion today and Recent revision to AKA. They request observation over the weekend and for me to follow up on Monday for possible readmit Monday to CIR pending he remains medically stable. I have alerted acute team and TOC. I will follow up on Monday.  Ottie Glazier, RN, MSN Rehab Admissions Coordinator [phone] 05/22/2021 10:47 AM

## 2021-05-22 NOTE — Progress Notes (Signed)
Contacted bedside RN to repeat H/H after blood transfusion received this AM. Hgb resulted at 9.1.  Sunny Schlein Tyteanna Ost DNP [facility] RN

## 2021-05-22 NOTE — Progress Notes (Addendum)
  Progress Note    05/22/2021 7:20 AM 2 Days Post-Op  Subjective:  says his leg feels better  Afebrile HR 70's-100's NSR 100's-120's systolic 100% RA  Vitals:   05/21/21 2300 05/22/21 0355  BP: 112/75 124/74  Pulse: 91 85  Resp: 20 18  Temp: 98 F (36.7 C) 98 F (36.7 C)  SpO2: 96% 99%    Physical Exam: Incisions:  bloody bandage removed and incision is clean with staples in tact.     CBC    Component Value Date/Time   WBC 9.1 05/22/2021 0135   RBC 2.47 (L) 05/22/2021 0135   HGB 6.7 (LL) 05/22/2021 0135   HCT 21.1 (L) 05/22/2021 0135   PLT 455 (H) 05/22/2021 0135   MCV 85.4 05/22/2021 0135   MCH 27.1 05/22/2021 0135   MCHC 31.8 05/22/2021 0135   RDW 15.3 05/22/2021 0135   LYMPHSABS 0.7 05/18/2021 0712   MONOABS 1.0 05/18/2021 0712   EOSABS 0.0 05/18/2021 0712   BASOSABS 0.0 05/18/2021 0712    BMET    Component Value Date/Time   NA 129 (L) 05/22/2021 0135   K 4.0 05/22/2021 0135   CL 97 (L) 05/22/2021 0135   CO2 27 05/22/2021 0135   GLUCOSE 112 (H) 05/22/2021 0135   BUN 34 (H) 05/22/2021 0135   CREATININE 0.88 05/22/2021 0135   CALCIUM 8.7 (L) 05/22/2021 0135   GFRNONAA >60 05/22/2021 0135    INR No results found for: INR   Intake/Output Summary (Last 24 hours) at 05/22/2021 0720 Last data filed at 05/21/2021 2200 Gross per 24 hour  Intake 551.83 ml  Output 475 ml  Net 76.83 ml     Assessment/Plan:  75 y.o. male is s/p left above knee amputation  2 Days Post-Op  -bandage removed and incision looks good with staples in tact.  -acute surgical blood loss anemia-will transfuse two units PRBC's that were ordered overnight. -cbc in am -ok for CIR when insurance approved.   Doreatha Massed, PA-C [facility] [phone] 05/22/2021 7:20 AM     I have seen and evaluated the patient. I agree with the PA note as documented above.  Left AKA looks good after dressing removed.  We will transfuse 1 unit packed red blood cell for  hemoglobin 6.7.  Would like to get back to CIR if insurance approves.  Cephus Shelling, MD [facility] [place] Office: [phone]

## 2021-05-22 NOTE — H&P (Signed)
Physical Medicine and Rehabilitation Admission H&P     HPI: Horace PorteousBilly J Hilgeman is a 75 year old right-handed male with history of hypertension, diabetes mellitus, tobacco and alcohol use.  Per chart review lives with spouse.  Independent prior to admission.  Multilevel home 4 steps to entry.  Spouse works during the day.  Presented 05/13/2021 with progressive left foot pain x2 months with ischemic changes and findings of decreased femoral pulses bilaterally.  Underwent ultrasound-guided left femoral artery with abdominal aortogram showing bilateral renal artery stenosis.  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 Myra GianottiBrabham.  [facility] well as mild hyponatremia 130-131.  He was cleared to begin subcutaneous heparin for DVT prophylaxis.  His therapy evaluations completed and due to decreased functional mobility was admitted to inpatient rehab services 05/15/2021.  Patient with slow progressive gains and pain management as directed.  Close monitoring of BKA site noted boggy necrotic area concerning along incision line as well as leukocytosis [id number] with vascular surgery consulted and if you have staples were removed noted underlying abscess with poor healing and increased necrosis.  Due to these ischemic changes patient was discharged to acute care services 05/20/2021 and underwent left AKA per Dr. Chestine Sporelark.  Postoperative course anemia 6.7 05/22/2021 with orders to transfuse 2 units packed red blood cells and close monitoring of sodium 129 and leukocytosis improved 9100 as well as hemoglobin 9.0.  He remained on subcutaneous heparin for DVT prophylaxis.  Therapy evaluations again completed postoperatively recommendations of CIR and patient was admitted to inpatient rehab services to continue comprehensive therapies.  Review of Systems  Constitutional:  Negative for chills and fever.  HENT:  Negative for  hearing loss.   Eyes:  Negative for blurred vision, double vision and discharge.  Respiratory:  Negative for cough and shortness of breath.   Cardiovascular:  Positive for leg swelling. Negative for chest pain and palpitations.  Gastrointestinal:  Positive for constipation. Negative for heartburn, nausea and vomiting.  Genitourinary:  Positive for urgency. Negative for dysuria and hematuria.  Musculoskeletal:  Positive for joint pain and myalgias.  Skin:  Negative for rash.  All other systems reviewed and are negative. Past Medical History:  Diagnosis Date   Diabetes mellitus without complication (HCC)    Hypertension    Past Surgical History:  Procedure Laterality Date   ABDOMINAL AORTOGRAM W/LOWER EXTREMITY N/A 05/12/2021   Procedure: ABDOMINAL AORTOGRAM W/LOWER EXTREMITY;  Surgeon: Nada LibmanBrabham, Vance W, MD;  Location: [facility] INVASIVE [facility] LAB;  Service: Cardiovascular;  Laterality: N/A;   AMPUTATION Left 05/13/2021   Procedure: AMPUTATION BELOW KNEE LEFT ;  Surgeon: Nada LibmanBrabham, Vance W, MD;  [facility];  Service: Vascular;  Laterality: Left;   AMPUTATION Left 05/20/2021   Procedure: AMPUTATION ABOVE KNEE;  Surgeon: Cephus Shellinglark, Christopher J, MD;  [facility];  Service: Vascular;  Laterality: Left;   HERNIA REPAIR     KNEE SURGERY     History reviewed. No pertinent family history. Social History:  reports that he has been smoking cigarettes. He has been smoking an average of .5 packs per day. He has never used smokeless tobacco. No history on file for alcohol use and drug use. Allergies:  Allergies  Allergen Reactions   Lisinopril Swelling    Swelling of lips   Medications Prior to Admission  Medication Sig Dispense Refill   amLODipine (NORVASC) 10 MG tablet Take 10 mg by mouth  daily.     aspirin 325 MG tablet Take 325 mg by mouth daily.     atenolol (TENORMIN) 100 MG tablet Take 100 mg by mouth daily.     atorvastatin (LIPITOR) 20 MG tablet Take 20 mg by mouth at bedtime.      cyanocobalamin 1000 MCG tablet Take 1,000 mcg by mouth daily.     hydrochlorothiazide (HYDRODIURIL) 25 MG tablet Take 25 mg by mouth daily.     metFORMIN (GLUCOPHAGE) 500 MG tablet Take 500 mg by mouth daily with breakfast.     methocarbamol (ROBAXIN) 750 MG tablet Take 750 mg by mouth 3 (three) times daily.     oxybutynin (DITROPAN) 5 MG tablet Take 0.5 tablets (2.5 mg total) by mouth 2 (two) times daily.      Drug Regimen Review Drug regimen was reviewed and remains appropriate with no significant issues identified  Home: Home Living Family/patient expects to be discharged to:: Private residence Living Arrangements: Spouse/significant other, Children Available Help at Discharge: Available PRN/intermittently, Available 24 hours/day (wife taking FMLA) Type of Home: House Home Access: Stairs to enter [employer] of Steps: 4 Entrance Stairs-Rails: Right, Left Home Layout: Multi-level Alternate Level Stairs-Number of Steps: Reports his bed/bath in downstairs, unable to recall number of steps Bathroom Shower/Tub: [employer] forces operational officer Accessibility: Yes Home Equipment: None Additional Comments: History obtained from previous admission  Lives With: Spouse, Family   Functional History: Prior Function Level of Independence: Independent Comments: Prior to initial BKA, pt was independent in all tasks  Functional Status:  Mobility: Bed Mobility Overal bed mobility: Needs Assistance Bed Mobility: Supine to Sit, Sit to Supine Supine to sit: HOB elevated, Min assist Sit to supine: Min assist General bed mobility comments: Assist to elevate trunk into sitting Transfers Overall transfer level: Needs assistance Equipment used: None Transfers: Lateral/Scoot Transfers Sit to Stand: Mod assist, +2 physical assistance, +2 safety/equipment  Lateral/Scoot Transfers: Min assist General transfer comment: Pt scooted bed to recliner to bed with min assist  using bed pad under pt to assist scooting      ADL: ADL Overall ADL's : Needs assistance/impaired Eating/Feeding: Set up, Bed level Grooming: Set up, Sitting Upper Body Bathing: Set up, Sitting Lower Body Bathing: Moderate assistance, Sitting/lateral leans, Bed level Upper Body Dressing : Set up, Sitting Lower Body Dressing: Moderate assistance, Sitting/lateral leans, Bed level Toileting- Clothing Manipulation and Hygiene: Maximal assistance, Sitting/lateral lean General ADL Comments: Pt with noted sitting/standing balance deficits, pain and confusion requiring increased assist for LB ADLs and transfers.  Cognition: Cognition Overall Cognitive Status: Impaired/Different from baseline Arousal/Alertness: Awake/alert Orientation Level: Oriented to person, Oriented to place, Disoriented to time Cognition Arousal/Alertness: Awake/alert, Lethargic Behavior During Therapy: Flat affect Overall Cognitive Status: Impaired/Different from baseline Area of Impairment: Orientation, Safety/judgement, Awareness, Problem solving, Following commands, Memory Orientation Level: Disoriented to, Place, Time Memory: Decreased short-term memory Following Commands: Follows one step commands with increased time Safety/Judgement: Decreased awareness of safety Awareness: Intellectual Problem Solving: Slow processing, Difficulty sequencing, Requires verbal cues General Comments: Pt with intermittent confusion about location, able to answer he is at hospital but confused by timeline of rehab, surgery, etc. Slower processing and flat affect, increased time to follow directions but attempts all tasks. Cues for safety and sequencing  Physical Exam: Blood pressure 124/74, pulse 85, temperature 98 F (36.7 C), temperature source Oral, resp. rate 18, height 5\' 11"  (1.803 m), weight 55.7 kg, SpO2 99 %. Physical Exam Constitutional:      Appearance:  Normal appearance.  HENT:     Head: Normocephalic.     Right  Ear: Tympanic membrane normal.     Mouth/Throat:     Mouth: Mucous membranes are moist.     Pharynx: Oropharynx is clear.     Comments: Missing teeth.  Eyes:     Extraocular Movements: Extraocular movements intact.     Pupils: Pupils are equal, round, and reactive to light.  Cardiovascular:     Rate and Rhythm: Normal rate and regular rhythm.     Heart sounds: No murmur heard. Pulmonary:     Effort: Pulmonary effort is normal. No respiratory distress.     Breath sounds: No wheezing.  Abdominal:     General: Bowel sounds are normal. There is no distension.     Tenderness: There is no abdominal tenderness.  Skin:    General: Skin is warm.     Comments: Left AKA site postoperative dressing in place appropriately tender. Mild serosanguinous drainage from incision which is well approximated.   Neurological:     General: No focal deficit present.     Mental Status: He is alert and oriented to person, place, and time.     Cranial Nerves: No cranial nerve deficit.     Comments: Patient is alert no acute distress oriented x3 and follows commands. UE 5/5. RLE 4-5/5. LLE: HF 3/5. No focal sensory deficits  Psychiatric:        Mood and Affect: Mood normal.        Behavior: Behavior normal.        Thought Content: Thought content normal.    Results for orders placed or performed during the hospital encounter of 05/20/21 (from the past 48 hour(s))  Glucose, capillary     Status: None   Collection Time: 05/20/21  2:39 PM  Result Value Ref Range   Glucose-Capillary 95 70 - 99 mg/dL    Comment: Glucose reference range applies only to samples taken after fasting for at least 8 hours.  Glucose, capillary     Status: None   Collection Time: 05/20/21  5:23 PM  Result Value Ref Range   Glucose-Capillary 94 70 - 99 mg/dL    Comment: Glucose reference range applies only to samples taken after fasting for at least 8 hours.  Basic metabolic panel     Status: Abnormal   Collection Time: 05/21/21   1:27 AM  Result Value Ref Range   Sodium 129 (L) 135 - 145 mmol/L   Potassium 3.7 3.5 - 5.1 mmol/L   Chloride 96 (L) 98 - 111 mmol/L   CO2 25 22 - 32 mmol/L   Glucose, Bld 119 (H) 70 - 99 mg/dL    Comment: Glucose reference range applies only to samples taken after fasting for at least 8 hours.   BUN 40 (H) 8 - 23 mg/dL   Creatinine, Ser 4.65 0.61 - 1.24 mg/dL   Calcium 8.8 (L) 8.9 - 10.3 mg/dL   GFR, Estimated >03 >54 mL/min    Comment: (NOTE) Calculated using the CKD-EPI Creatinine Equation (2021)    Anion gap 8 5 - 15    Comment: Performed at [facility], [address] [place], [place] [address]  CBC     Status: Abnormal   Collection Time: 05/21/21  1:27 AM  Result Value Ref Range   WBC 15.0 (H) 4.0 - 10.5 K/uL   RBC 2.72 (L) 4.22 - 5.81 MIL/uL   Hemoglobin 7.3 (L) 13.0 - 17.0 g/dL  HCT 23.0 (L) 39.0 - 52.0 %   MCV 84.6 80.0 - 100.0 fL   MCH 26.8 26.0 - 34.0 pg   MCHC 31.7 30.0 - 36.0 g/dL   RDW 78.2 42.3 - 53.6 %   Platelets 474 (H) 150 - 400 K/uL   nRBC 0.0 0.0 - 0.2 %    Comment: Performed at [facility], [address], [place], [place] [address]  Glucose, capillary     Status: Abnormal   Collection Time: 05/21/21  6:07 AM  Result Value Ref Range   Glucose-Capillary 132 (H) 70 - 99 mg/dL    Comment: Glucose reference range applies only to samples taken after fasting for at least 8 hours.  Glucose, capillary     Status: Abnormal   Collection Time: 05/21/21 11:38 AM  Result Value Ref Range   Glucose-Capillary 249 (H) 70 - 99 mg/dL    Comment: Glucose reference range applies only to samples taken after fasting for at least 8 hours.   Comment 1 Notify RN    Comment 2 Document in Chart   Glucose, capillary     Status: Abnormal   Collection Time: 05/21/21  4:02 PM  Result Value Ref Range   Glucose-Capillary 120 (H) 70 - 99 mg/dL    Comment: Glucose reference range applies only to samples taken after fasting for at least 8 hours.   Comment 1 Notify RN     Comment 2 Document in Chart   Glucose, capillary     Status: Abnormal   Collection Time: 05/21/21  8:37 PM  Result Value Ref Range   Glucose-Capillary 198 (H) 70 - 99 mg/dL    Comment: Glucose reference range applies only to samples taken after fasting for at least 8 hours.  Basic metabolic panel     Status: Abnormal   Collection Time: 05/22/21  1:35 AM  Result Value Ref Range   Sodium 129 (L) 135 - 145 mmol/L   Potassium 4.0 3.5 - 5.1 mmol/L   Chloride 97 (L) 98 - 111 mmol/L   CO2 27 22 - 32 mmol/L   Glucose, Bld 112 (H) 70 - 99 mg/dL    Comment: Glucose reference range applies only to samples taken after fasting for at least 8 hours.   BUN 34 (H) 8 - 23 mg/dL   Creatinine, Ser 5.40 0.61 - 1.24 mg/dL   Calcium 8.7 (L) 8.9 - 10.3 mg/dL   GFR, Estimated >08 >67 mL/min    Comment: (NOTE) Calculated using the CKD-EPI Creatinine Equation (2021)    Anion gap 5 5 - 15    Comment: Performed at [facility], [address], [place], [place] [address]  CBC     Status: Abnormal   Collection Time: 05/22/21  1:35 AM  Result Value Ref Range   WBC 9.1 4.0 - 10.5 K/uL   RBC 2.47 (L) 4.22 - 5.81 MIL/uL   Hemoglobin 6.7 (LL) 13.0 - 17.0 g/dL    Comment: REPEATED TO VERIFY THIS CRITICAL RESULT HAS VERIFIED AND BEEN CALLED TO L LOUIS RN BY JAMIE KIRBY ON 07 15 2022 AT 0231, AND HAS BEEN READ BACK.     HCT 21.1 (L) 39.0 - 52.0 %   MCV 85.4 80.0 - 100.0 fL   MCH 27.1 26.0 - 34.0 pg   MCHC 31.8 30.0 - 36.0 g/dL   RDW 93.2 67.1 - 24.5 %   Platelets 455 (H) 150 - 400 K/uL   nRBC 0.2 0.0 - 0.2 %  Comment: Performed at [facility], [address], [place], [place] [address]   No results found.     Medical Problem List and Plan: 1.  Debility secondary to left BKA 05/13/2021 with necrotic changes revised to left AKA 05/20/2021  -patient may  shower if left leg is covered  -ELOS/Goals: 10-14 days, supervision to mod I 2.  Antithrombotics: -DVT/anticoagulation: Subcutaneous  heparin  -antiplatelet therapy: Aspirin 81 mg daily 3. Pain Management: Oxycodone as needed  -massage and elevation to control pain/edema as well. 4. Mood: Provide emotional support  -antipsychotic agents: N/A 5. Neuropsych: This patient is capable of making decisions on his own behalf. 6. Skin/Wound Care: Routine skin checks  -dry dressing and ACE wrap to stump daily---transition to shrinker 7. Fluids/Electrolytes/Nutrition: Routine in and outs with follow-up chemistries 8.  Acute blood loss anemia.  Transfused 2 units 05/22/2021.  Follow-up CBC 9.  Hypertension.  Norvasc 10 mg daily, Tenormin 25 mg twice daily.  Monitor with increased mobility 10.  Hyperlipidemia.  Lipitor 11.  Diabetes mellitus.  Hemoglobin A1c 5.2.  SSI.  Patient on Glucophage 500 mg daily prior to admission.   -check cbgs ac and hs.  -Resume as needed 12.  History of tobacco alcohol use.  Provide counseling 13.  Hyponatremia.  Follow-up chemistries upon admission tomorrow    Charlton Amor, PA-C 05/22/2021

## 2021-05-22 NOTE — Progress Notes (Signed)
Spoke to [place] from [employer]. She reccommended patient get CBC prior to going to CIR Monday.   Kalman Jewels, RN

## 2021-05-22 NOTE — Progress Notes (Signed)
Physical Therapy Treatment Patient Details Name: Todd Duran MRN: [id number] DOB: 1945/11/25 Today's Date: 05/22/2021    History of Present Illness Patient is a 75 year old male with recent L BKA and discharge to CIR. 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  PMH includes DM, HTN    PT Comments    PT remains weak and continues to require significant physical assistance to perform functional mobility at this time. Pt has limited awareness of deficits, initially reporting the ability to stand without assistance. Pt becomes fatigued and sleepy when sitting on bedside commode, limiting session. Pt will continue to benefit from aggressive mobilization and acute PT services to reduce falls risk and caregiver burden. PT continues to recommend CIR placement.   Follow Up Recommendations  CIR     Equipment Recommendations  Wheelchair (measurements PT);Wheelchair cushion (measurements PT)    Recommendations for Other Services       Precautions / Restrictions Precautions Precautions: Fall Precaution Comments: L AKA Required Braces or Orthoses: Other Brace Other Brace: shrinker, limb guard Restrictions Weight Bearing Restrictions: No LLE Weight Bearing: Non weight bearing    Mobility  Bed Mobility Overal bed mobility: Needs Assistance Bed Mobility: Supine to Sit;Sit to Supine     Supine to sit: Mod assist Sit to supine: Max assist        Transfers Overall transfer level: Needs assistance Equipment used: 1 person hand held assist Transfers: Sit to/from [employer] Sit to Stand: Max assist   Squat pivot transfers: Max assist     General transfer comment: PT provides knee block and BUE support, cues for rocking  Ambulation/Gait                 Stairs             Wheelchair Mobility    Modified Rankin (Stroke Patients Only)       Balance Overall balance assessment: Needs  assistance Sitting-balance support: Feet supported;Single extremity supported;Bilateral upper extremity supported Sitting balance-Leahy Scale: Poor Sitting balance - Comments: reliant on UE support Postural control: Posterior lean Standing balance support: Bilateral upper extremity supported Standing balance-Leahy Scale: Poor Standing balance comment: modA with knee block and BUE support                            Cognition Arousal/Alertness: Awake/alert Behavior During Therapy: Impulsive Overall Cognitive Status: Impaired/Different from baseline Area of Impairment: Safety/judgement;Awareness;Problem solving                         Safety/Judgement: Decreased awareness of safety;Decreased awareness of deficits Awareness: Intellectual Problem Solving: Slow processing;Requires verbal cues;Requires tactile cues        Exercises      General Comments General comments (skin integrity, edema, etc.): VSS on RA, pt denies dizziness but does report fatigue when sitting on bedside commode      Pertinent Vitals/Pain Pain Assessment: Faces Faces Pain Scale: Hurts even more Pain Location: L residual limb Pain Descriptors / Indicators: Grimacing Pain Intervention(s): Monitored during session    Home Living                      Prior Function            PT Goals (current goals can now be found in the care plan section) Acute Rehab PT Goals Patient Stated Goal: less pain  Progress towards PT goals: Progressing toward goals (slowly)    Frequency    Min 3X/week      PT Plan Current plan remains appropriate    Co-evaluation              AM-PAC PT "6 Clicks" Mobility   Outcome Measure  Help needed turning from your back to your side while in a flat bed without using bedrails?: A Little Help needed moving from lying on your back to sitting on the side of a flat bed without using bedrails?: A Little Help needed moving to and from a bed to  a chair (including a wheelchair)?: A Lot Help needed standing up from a chair using your arms (e.g., wheelchair or bedside chair)?: A Lot Help needed to walk in hospital room?: Total Help needed climbing 3-5 steps with a railing? : Total 6 Click Score: 12    End of Session Equipment Utilized During Treatment: Gait belt Activity Tolerance: Patient limited by fatigue Patient left: in bed;with call bell/phone within reach;with nursing/sitter in room Nurse Communication: Mobility status PT Visit Diagnosis: Other abnormalities of gait and mobility (R26.89);Muscle weakness (generalized) (M62.81)     Time: 1310-1330 PT Time Calculation (min) (ACUTE ONLY): 20 min  Charges:  $Therapeutic Activity: 8-22 mins                     Arlyss Gandy, PT, DPT Acute Rehabilitation Pager: [phone]    Arlyss Gandy 05/22/2021, 1:38 PM

## 2021-05-23 LAB — BPAM RBC
Blood Product Expiration Date: 202208102359
Blood Product Expiration Date: 202208102359
ISSUE DATE / TIME: 202207150826
ISSUE DATE / TIME: 202207151118
Unit Type and Rh: 5100
Unit Type and Rh: 5100

## 2021-05-23 LAB — TYPE AND SCREEN
ABO/RH(D): O POS
Antibody Screen: NEGATIVE
Unit division: 0
Unit division: 0

## 2021-05-23 LAB — GLUCOSE, CAPILLARY
Glucose-Capillary: 100 mg/dL — ABNORMAL HIGH (ref 70–99)
Glucose-Capillary: 101 mg/dL — ABNORMAL HIGH (ref 70–99)
Glucose-Capillary: 169 mg/dL — ABNORMAL HIGH (ref 70–99)

## 2021-05-23 MED ORDER — FOLIC ACID 1 MG PO TABS: 1.0000 mg | ORAL_TABLET | Freq: Every day | ORAL | Status: AC

## 2021-05-23 MED ORDER — POLYETHYLENE GLYCOL 3350 17 G PO PACK: 17.0000 g | PACK | Freq: Every day | ORAL | 0 refills | Status: DC | PRN

## 2021-05-23 MED ORDER — OXYCODONE-ACETAMINOPHEN 5-325 MG PO TABS: 1.0000 | ORAL_TABLET | Freq: Four times a day (QID) | ORAL | Status: DC | PRN

## 2021-05-23 MED ORDER — ATENOLOL 25 MG PO TABS: 25.0000 mg | ORAL_TABLET | Freq: Two times a day (BID) | ORAL | Status: AC

## 2021-05-23 MED ORDER — ADULT MULTIVITAMIN W/MINERALS CH: 1.0000 | ORAL_TABLET | Freq: Every day | ORAL | Status: AC

## 2021-05-23 MED ORDER — ASPIRIN 81 MG PO TBEC
81.0000 mg | DELAYED_RELEASE_TABLET | Freq: Every day | ORAL | Status: DC
Start: 2021-05-24 — End: 2021-05-23

## 2021-05-23 MED ORDER — ASPIRIN 81 MG PO TBEC: 81.0000 mg | DELAYED_RELEASE_TABLET | Freq: Every day | ORAL | 11 refills | Status: AC

## 2021-05-23 MED ORDER — THIAMINE HCL 100 MG PO TABS: 100.0000 mg | ORAL_TABLET | Freq: Every day | ORAL | Status: DC

## 2021-05-23 MED ORDER — DOCUSATE SODIUM 100 MG PO CAPS: 100.0000 mg | ORAL_CAPSULE | Freq: Every day | ORAL | 0 refills | Status: DC

## 2021-05-23 NOTE — Progress Notes (Addendum)
  Progress Note    05/23/2021 7:17 AM 3 Days Post-Op  Subjective:  no complaints   Afebrile HR 70's NSR 110's-130's systolic 97% RA  Vitals:   05/22/21 2110 05/23/21 0400  BP: 119/68 137/71  Pulse: 76 76  Resp:  15  Temp:  97.9 F (36.6 C)  SpO2:  97%    Physical Exam: Incisions:  bandage changed this am-did not change today.   CBC    Component Value Date/Time   WBC 9.1 05/22/2021 0135   RBC 2.47 (L) 05/22/2021 0135   HGB 9.1 (L) 05/22/2021 2214   HCT 27.8 (L) 05/22/2021 2214   PLT 455 (H) 05/22/2021 0135   MCV 85.4 05/22/2021 0135   MCH 27.1 05/22/2021 0135   MCHC 31.8 05/22/2021 0135   RDW 15.3 05/22/2021 0135   LYMPHSABS 0.7 05/18/2021 0712   MONOABS 1.0 05/18/2021 0712   EOSABS 0.0 05/18/2021 0712   BASOSABS 0.0 05/18/2021 0712    BMET    Component Value Date/Time   NA 129 (L) 05/22/2021 0135   K 4.0 05/22/2021 0135   CL 97 (L) 05/22/2021 0135   CO2 27 05/22/2021 0135   GLUCOSE 112 (H) 05/22/2021 0135   BUN 34 (H) 05/22/2021 0135   CREATININE 0.88 05/22/2021 0135   CALCIUM 8.7 (L) 05/22/2021 0135   GFRNONAA >60 05/22/2021 0135    INR No results found for: INR   Intake/Output Summary (Last 24 hours) at 05/23/2021 0717 Last data filed at 05/22/2021 1513 Gross per 24 hour  Intake 1639 ml  Output 750 ml  Net 889 ml     Assessment/Plan:  75 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 -hopeful for CIR on Monday.   Doreatha Massed, PA-C [facility] [phone] 05/23/2021 7:17 AM   I have seen and evaluated the patient. I agree with the PA note as documented above.  Readmitted from CIR with necrotic left BKA.  AKA looks good after dressing removed.  No fevers.  White blood cells improved to 9.1 yesterday.  Hemoglobin improved after transfusion from 6.7 ---> 9.1.  Hoping to get back to CIR once  approved by [employer].  Cephus Shelling, MD [facility] [place] Office: [phone]

## 2021-05-23 NOTE — Care Management (Signed)
Order for DC 7/16 to CIR when insurance approved.  Per CIR Admission Coordinator's documentation yesterday, they will evaluate on Monday and follow up with the [facility] for auth to readmit if medically stable.

## 2021-05-24 LAB — GLUCOSE, CAPILLARY
Glucose-Capillary: 114 mg/dL — ABNORMAL HIGH (ref 70–99)
Glucose-Capillary: 118 mg/dL — ABNORMAL HIGH (ref 70–99)
Glucose-Capillary: 115 mg/dL — ABNORMAL HIGH (ref 70–99)
Glucose-Capillary: 170 mg/dL — ABNORMAL HIGH (ref 70–99)

## 2021-05-24 NOTE — Progress Notes (Addendum)
  Progress Note    05/24/2021 9:17 AM 4 Days Post-Op  Subjective:  says he feels better.  Says his leg feels fine but does itch some.  Afebrile HR 70's-80's NSR 90's-120's systolic 96% RA  Vitals:   05/24/21 0342 05/24/21 0700  BP: 123/69 102/60  Pulse: 78 76  Resp: 16 12  Temp: 97.8 F (36.6 C) 98.8 F (37.1 C)  SpO2: 98% 96%    Physical Exam: Incisions:  dressing removed.  There is mild bloody drainage on the bandage but incision looks good.  No active ooze with dressing off.      CBC    Component Value Date/Time   WBC 9.1 05/22/2021 0135   RBC 2.47 (L) 05/22/2021 0135   HGB 9.1 (L) 05/22/2021 2214   HCT 27.8 (L) 05/22/2021 2214   PLT 455 (H) 05/22/2021 0135   MCV 85.4 05/22/2021 0135   MCH 27.1 05/22/2021 0135   MCHC 31.8 05/22/2021 0135   RDW 15.3 05/22/2021 0135   LYMPHSABS 0.7 05/18/2021 0712   MONOABS 1.0 05/18/2021 0712   EOSABS 0.0 05/18/2021 0712   BASOSABS 0.0 05/18/2021 0712    BMET    Component Value Date/Time   NA 129 (L) 05/22/2021 0135   K 4.0 05/22/2021 0135   CL 97 (L) 05/22/2021 0135   CO2 27 05/22/2021 0135   GLUCOSE 112 (H) 05/22/2021 0135   BUN 34 (H) 05/22/2021 0135   CREATININE 0.88 05/22/2021 0135   CALCIUM 8.7 (L) 05/22/2021 0135   GFRNONAA >60 05/22/2021 0135       Intake/Output Summary (Last 24 hours) at 05/24/2021 0917 Last data filed at 05/24/2021 0800 Gross per 24 hour  Intake --  Output 780 ml  Net -780 ml     Assessment/Plan:  75 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  -pt to f/u in our office in 4 weeks - office will arrange appt   Doreatha Massed, PA-C [facility] [phone] 05/24/2021 9:17 AM   I have seen and evaluated the patient. I agree with the PA note as documented above.  Left AKA looks good.  Has been stable through the weekend.  He responded to blood  transfusion on Friday.  Stable for CIR if he gets insurance approval.  Cephus Shelling, MD [facility] [place] Office: [phone]

## 2021-05-24 NOTE — Plan of Care (Signed)

## 2021-05-24 NOTE — Progress Notes (Signed)
Inpatient Rehabilitation Admissions Coordinator   [facility] MD will request latest CBC results for 7/18 when I call to pursue [facility] insurance approval for readmit to CIR on Monday. Please place order.  Ottie Glazier, RN, MSN Rehab Admissions Coordinator [phone] 05/24/2021 7:44 PM

## 2021-05-25 ENCOUNTER — Inpatient Hospital Stay (HOSPITAL_COMMUNITY)
Admission: RE | Admit: 2021-05-25 | Discharge: 2021-06-05 | DRG: 560 | Disposition: A | Discharge status: Other Acute Inpatient Hospital | Payer: No Typology Code available for payment source | Source: Intra-hospital | Attending: Physical Medicine & Rehabilitation | Admitting: Physical Medicine & Rehabilitation

## 2021-05-25 ENCOUNTER — Encounter (HOSPITAL_COMMUNITY): Payer: Self-pay | Admitting: Physical Medicine & Rehabilitation

## 2021-05-25 ENCOUNTER — Other Ambulatory Visit: Payer: Self-pay

## 2021-05-25 DIAGNOSIS — Z4781 Encounter for orthopedic aftercare following surgical amputation: Secondary | ICD-10-CM | POA: Diagnosis present

## 2021-05-25 DIAGNOSIS — I10 Essential (primary) hypertension: Secondary | ICD-10-CM | POA: Diagnosis present

## 2021-05-25 DIAGNOSIS — Z7289 Other problems related to lifestyle: Secondary | ICD-10-CM | POA: Diagnosis not present

## 2021-05-25 DIAGNOSIS — Z7141 Alcohol abuse counseling and surveillance of alcoholic: Secondary | ICD-10-CM

## 2021-05-25 DIAGNOSIS — E119 Type 2 diabetes mellitus without complications: Secondary | ICD-10-CM

## 2021-05-25 DIAGNOSIS — Z716 Tobacco abuse counseling: Secondary | ICD-10-CM

## 2021-05-25 DIAGNOSIS — E785 Hyperlipidemia, unspecified: Secondary | ICD-10-CM | POA: Diagnosis present

## 2021-05-25 DIAGNOSIS — Z79899 Other long term (current) drug therapy: Secondary | ICD-10-CM

## 2021-05-25 DIAGNOSIS — L97518 Non-pressure chronic ulcer of other part of right foot with other specified severity: Secondary | ICD-10-CM | POA: Diagnosis present

## 2021-05-25 DIAGNOSIS — F17210 Nicotine dependence, cigarettes, uncomplicated: Secondary | ICD-10-CM | POA: Diagnosis present

## 2021-05-25 DIAGNOSIS — I70261 Atherosclerosis of native arteries of extremities with gangrene, right leg: Secondary | ICD-10-CM | POA: Diagnosis not present

## 2021-05-25 DIAGNOSIS — E1142 Type 2 diabetes mellitus with diabetic polyneuropathy: Secondary | ICD-10-CM | POA: Diagnosis present

## 2021-05-25 DIAGNOSIS — E1152 Type 2 diabetes mellitus with diabetic peripheral angiopathy with gangrene: Secondary | ICD-10-CM | POA: Diagnosis not present

## 2021-05-25 DIAGNOSIS — E871 Hypo-osmolality and hyponatremia: Secondary | ICD-10-CM | POA: Diagnosis present

## 2021-05-25 DIAGNOSIS — K5901 Slow transit constipation: Secondary | ICD-10-CM | POA: Diagnosis present

## 2021-05-25 DIAGNOSIS — E11621 Type 2 diabetes mellitus with foot ulcer: Secondary | ICD-10-CM | POA: Diagnosis present

## 2021-05-25 DIAGNOSIS — Z7984 Long term (current) use of oral hypoglycemic drugs: Secondary | ICD-10-CM

## 2021-05-25 DIAGNOSIS — D62 Acute posthemorrhagic anemia: Secondary | ICD-10-CM | POA: Diagnosis present

## 2021-05-25 DIAGNOSIS — R5381 Other malaise: Secondary | ICD-10-CM | POA: Diagnosis present

## 2021-05-25 DIAGNOSIS — I708 Atherosclerosis of other arteries: Secondary | ICD-10-CM | POA: Diagnosis present

## 2021-05-25 DIAGNOSIS — Z419 Encounter for procedure for purposes other than remedying health state, unspecified: Secondary | ICD-10-CM

## 2021-05-25 DIAGNOSIS — Z89612 Acquired absence of left leg above knee: Secondary | ICD-10-CM | POA: Diagnosis not present

## 2021-05-25 DIAGNOSIS — I739 Peripheral vascular disease, unspecified: Secondary | ICD-10-CM | POA: Diagnosis not present

## 2021-05-25 DIAGNOSIS — Z888 Allergy status to other drugs, medicaments and biological substances status: Secondary | ICD-10-CM

## 2021-05-25 DIAGNOSIS — Z7982 Long term (current) use of aspirin: Secondary | ICD-10-CM

## 2021-05-25 LAB — CBC
HCT: 27.4 % — ABNORMAL LOW (ref 39.0–52.0)
HCT: 28 % — ABNORMAL LOW (ref 39.0–52.0)
Hemoglobin: 9 g/dL — ABNORMAL LOW (ref 13.0–17.0)
Hemoglobin: 8.8 g/dL — ABNORMAL LOW (ref 13.0–17.0)
MCH: 28 pg (ref 26.0–34.0)
MCH: 26.7 pg (ref 26.0–34.0)
MCHC: 32.8 g/dL (ref 30.0–36.0)
MCHC: 31.4 g/dL (ref 30.0–36.0)
MCV: 85.4 fL (ref 80.0–100.0)
MCV: 84.8 fL (ref 80.0–100.0)
Platelets: 536 10*3/uL — ABNORMAL HIGH (ref 150–400)
Platelets: 561 10*3/uL — ABNORMAL HIGH (ref 150–400)
RBC: 3.21 MIL/uL — ABNORMAL LOW (ref 4.22–5.81)
RBC: 3.3 MIL/uL — ABNORMAL LOW (ref 4.22–5.81)
RDW: 15.3 % (ref 11.5–15.5)
RDW: 15.4 % (ref 11.5–15.5)
WBC: 6.2 10*3/uL (ref 4.0–10.5)
WBC: 7.1 10*3/uL (ref 4.0–10.5)
nRBC: 0 % (ref 0.0–0.2)
nRBC: 0 % (ref 0.0–0.2)

## 2021-05-25 LAB — CREATININE, SERUM
Creatinine, Ser: 0.86 mg/dL (ref 0.61–1.24)
GFR, Estimated: >60 mL/min (ref >60)

## 2021-05-25 LAB — GLUCOSE, CAPILLARY
Glucose-Capillary: 129 mg/dL — ABNORMAL HIGH (ref 70–99)
Glucose-Capillary: 149 mg/dL — ABNORMAL HIGH (ref 70–99)
Glucose-Capillary: 109 mg/dL — ABNORMAL HIGH (ref 70–99)
Glucose-Capillary: 193 mg/dL — ABNORMAL HIGH (ref 70–99)

## 2021-05-25 MED ORDER — FOLIC ACID 1 MG PO TABS
1.0000 mg | ORAL_TABLET | Freq: Every day | ORAL | Status: DC
  Administered 2021-05-26 – 2021-06-04 (×10): 1 mg via ORAL
  Filled 2021-05-25 (×10): qty 1

## 2021-05-25 MED ORDER — DOCUSATE SODIUM 100 MG PO CAPS: 100.0000 mg | ORAL_CAPSULE | Freq: Every day | ORAL | Status: DC

## 2021-05-25 MED ORDER — AMLODIPINE BESYLATE 10 MG PO TABS
10.0000 mg | ORAL_TABLET | Freq: Every day | ORAL | Status: DC
  Administered 2021-05-26 – 2021-06-05 (×11): 10 mg via ORAL
  Filled 2021-05-25 (×11): qty 1

## 2021-05-25 MED ORDER — OXYBUTYNIN CHLORIDE 5 MG PO TABS
2.5000 mg | ORAL_TABLET | Freq: Two times a day (BID) | ORAL | Status: DC
  Administered 2021-05-25 – 2021-06-04 (×21): 2.5 mg via ORAL
  Filled 2021-05-25 (×21): qty 1

## 2021-05-25 MED ORDER — INSULIN ASPART 100 UNIT/ML IJ SOLN
0.0000 [IU] | Freq: Three times a day (TID) | INTRAMUSCULAR | Status: DC
Start: 2021-05-25 — End: 2021-06-05
  Administered 2021-05-26 – 2021-05-29 (×7): 1 [IU] via SUBCUTANEOUS
  Administered 2021-05-29: 3 [IU] via SUBCUTANEOUS
  Administered 2021-05-31: 2 [IU] via SUBCUTANEOUS
  Administered 2021-06-02 – 2021-06-04 (×3): 1 [IU] via SUBCUTANEOUS

## 2021-05-25 MED ORDER — DOCUSATE SODIUM 100 MG PO CAPS
100.0000 mg | ORAL_CAPSULE | Freq: Every day | ORAL | Status: DC
  Administered 2021-05-26 – 2021-05-27 (×2): 100 mg via ORAL
  Filled 2021-05-25 (×3): qty 1

## 2021-05-25 MED ORDER — POLYETHYLENE GLYCOL 3350 17 G PO PACK
17.0000 g | PACK | Freq: Every day | ORAL | Status: DC | PRN
  Administered 2021-06-02: 17 g via ORAL
  Filled 2021-05-25: qty 1

## 2021-05-25 MED ORDER — HEPARIN SODIUM (PORCINE) 5000 UNIT/ML IJ SOLN: 5000.0000 [IU] | Freq: Three times a day (TID) | INTRAMUSCULAR | Status: DC

## 2021-05-25 MED ORDER — ATORVASTATIN CALCIUM 10 MG PO TABS
20.0000 mg | ORAL_TABLET | Freq: Every day | ORAL | Status: DC
  Administered 2021-05-25 – 2021-06-04 (×11): 20 mg via ORAL
  Filled 2021-05-25 (×11): qty 2

## 2021-05-25 MED ORDER — ADULT MULTIVITAMIN W/MINERALS CH
1.0000 | ORAL_TABLET | Freq: Every day | ORAL | Status: DC
  Administered 2021-05-26 – 2021-06-04 (×10): 1 via ORAL
  Filled 2021-05-25 (×10): qty 1

## 2021-05-25 MED ORDER — ATENOLOL 25 MG PO TABS
25.0000 mg | ORAL_TABLET | Freq: Two times a day (BID) | ORAL | Status: DC
  Administered 2021-05-25 – 2021-06-05 (×22): 25 mg via ORAL
  Filled 2021-05-25 (×22): qty 1

## 2021-05-25 MED ORDER — OXYCODONE-ACETAMINOPHEN 5-325 MG PO TABS
1.0000 | ORAL_TABLET | ORAL | Status: DC | PRN
  Administered 2021-05-25 – 2021-05-27 (×8): 2 via ORAL
  Administered 2021-05-28 (×2): 1 via ORAL
  Administered 2021-05-29 – 2021-05-31 (×6): 2 via ORAL
  Administered 2021-05-31: 1 via ORAL
  Administered 2021-06-01: 2 via ORAL
  Administered 2021-06-01: 1 via ORAL
  Administered 2021-06-01 – 2021-06-05 (×8): 2 via ORAL
  Filled 2021-05-25 (×11): qty 2
  Filled 2021-05-25: qty 1
  Filled 2021-05-25 (×3): qty 2
  Filled 2021-05-25: qty 1
  Filled 2021-05-25 (×3): qty 2
  Filled 2021-05-25 (×2): qty 1
  Filled 2021-05-25 (×6): qty 2

## 2021-05-25 MED ORDER — ASPIRIN EC 81 MG PO TBEC
81.0000 mg | DELAYED_RELEASE_TABLET | Freq: Every day | ORAL | Status: DC
  Administered 2021-05-26 – 2021-06-04 (×10): 81 mg via ORAL
  Filled 2021-05-25 (×11): qty 1

## 2021-05-25 MED ORDER — HEPARIN SODIUM (PORCINE) 5000 UNIT/ML IJ SOLN
5000.0000 [IU] | Freq: Three times a day (TID) | INTRAMUSCULAR | Status: DC
  Administered 2021-05-25 – 2021-06-04 (×32): 5000 [IU] via SUBCUTANEOUS
  Filled 2021-05-25 (×33): qty 1

## 2021-05-25 MED ORDER — THIAMINE HCL 100 MG PO TABS
100.0000 mg | ORAL_TABLET | Freq: Every day | ORAL | Status: DC
  Administered 2021-05-26 – 2021-06-04 (×10): 100 mg via ORAL
  Filled 2021-05-25 (×10): qty 1

## 2021-05-25 NOTE — Progress Notes (Signed)
[name], MD   Physician  Physical Medicine and Rehabilitation  PMR Pre-admission      Signed  Date of Service:  05/21/2021  2:13 PM       Related encounter: Admission (Discharged) from 05/20/2021 in [facility] 4E CV SURGICAL PROGRESSIVE CARE       Signed          Show:Clear all [x] Written[x] Templated[x] Copied  Added by: [x] Cristina Gong, RN[x] [name], MD   [] Hover for details                                                                                                                                                                                                                                                                                                                                                                                                                                                                       PMR Admission Coordinator Pre-Admission Assessment   Patient: Todd Duran is an 75 y.o., male MRN: [id number] DOB: 1946/01/04 Height: 5' 11"  (180.3 cm) Weight: 55.7 kg   Insurance Information HMO:     PPO:      PCP:      IPA:      80/20:      OTHER: PRIMARY:  Community [place]      Policy#: [id number]      Subscriber: pt CM Name: Rubin Payor      Phone#: [phone]     Fax#: [phone] Pre-Cert#: [id number]    approved for 30 days  Employer: Benefits:  Phone #: [phone]     Name: Eff. Date: active     Deduct:       Out of Pocket Max:       Life Max: CIR: per [facility] contract       SECONDARY:       Policy#:      Phone#:   Development worker, community:       Phone#:   The Engineer, petroleum" for patients in Inpatient Rehabilitation Facilities with attached "Privacy Act [place] Records" was provided and verbally reviewed with: N/A   Emergency Contact Information Contact  Information       Name Relation Home Work Mobile    McSwain Spouse     [phone]    Cobb,Naimo Sister     [phone]    Stephan, Nelis     [phone]           Current Medical History  Patient Admitting Diagnosis: 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 right-handed male with history of hypertension as well as tobacco alcohol use.  Lives with spouse independent prior to admission.  Spouse works during the day.  Presented 05/13/2021 with progressive left foot pain x2 months with ischemic changes and findings of decreased femoral pulses bilaterally.  Underwent ultrasound-guided left femoral artery with abdominal aortogram showing bilateral renal artery stenosis.  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  Limb was not felt to be salvageable.  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 Trula Slade.  Hospital course anemia 8.5 as well as hyponatremia 130.  Cleared to begin heparin for DVT prophylaxis.Admitted to CIR on 05/15/2021.   Noted boggy necrotic area concerning along incision line with vascular surgeon notified. A few staples were removed noted underlying abscess with poor healing and increased necrosis. 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 Postoperative pain management as well and monitoring of Hgb .   Patient's medical record from [facility]  has been reviewed by the rehabilitation admission coordinator and physician.   Past Medical History      Past Medical History:  Diagnosis Date   Diabetes mellitus without complication ([place])     Hypertension        Family History   family history is not on file.   Prior Rehab/Hospitalizations Has the patient had prior rehab or hospitalizations prior to admission? Yes   CIR 7/8 until 05/20/21 when readmitted to acute for AKA   Has the patient had major surgery during 100 days prior to admission? Yes              Current Medications   Current  Facility-Administered Medications:   alum & mag hydroxide-simeth (MAALOX/MYLANTA) 200-200-20 MG/5ML suspension 15-30 mL, 15-30 mL, Oral, Q2H PRN, Rhyne, Samantha J, PA-C   amLODipine (NORVASC) tablet 10 mg, 10 mg, Oral, Daily, Marty Heck, MD, 10 mg at 05/25/21 0955   aspirin EC tablet 81 mg, 81 mg, Oral, Daily, Rhyne, Samantha J, PA-C, 81 mg at 05/25/21 0954   atenolol (TENORMIN) tablet 25 mg, 25 mg, Oral, BID, Rhyne, Samantha J, PA-C, 25 mg at 05/25/21 0954   atorvastatin (LIPITOR) tablet 20 mg, 20 mg, Oral, QHS, Rhyne,  Hulen Shouts, PA-C, 20 mg at 05/24/21 2024   docusate sodium (COLACE) capsule 100 mg, 100 mg, Oral, Daily, Rhyne, Samantha J, PA-C, 100 mg at 13/08/65 7846   folic acid (FOLVITE) tablet 1 mg, 1 mg, Oral, Daily, Rhyne, Samantha J, PA-C, 1 mg at 05/25/21 0954   guaiFENesin-dextromethorphan (ROBITUSSIN DM) 100-10 MG/5ML syrup 15 mL, 15 mL, Oral, Q4H PRN, Rhyne, Samantha J, PA-C   heparin injection 5,000 Units, 5,000 Units, Subcutaneous, Q8H, Rhyne, Samantha J, PA-C, 5,000 Units at 05/25/21 0630   hydrALAZINE (APRESOLINE) injection 5 mg, 5 mg, Intravenous, Q20 Min PRN, Rhyne, Samantha J, PA-C   insulin aspart (novoLOG) injection 0-9 Units, 0-9 Units, Subcutaneous, TID WC, Marty Heck, MD, 1 Units at 05/25/21 1234   labetalol (NORMODYNE) injection 10 mg, 10 mg, Intravenous, Q10 min PRN, Rhyne, Samantha J, PA-C   lactated ringers infusion, , Intravenous, Continuous, Stoltzfus, March Rummage, DO, Last Rate: 10 mL/hr at 05/22/21 1521, Restarted at 05/22/21 1521   magnesium sulfate IVPB 2 g 50 mL, 2 g, Intravenous, Daily PRN, Rhyne, Samantha J, PA-C   metoprolol tartrate (LOPRESSOR) injection 2-5 mg, 2-5 mg, Intravenous, Q2H PRN, Rhyne, Samantha J, PA-C   morphine 2 MG/ML injection 2 mg, 2 mg, Intravenous, Q2H PRN, Rhyne, Samantha J, PA-C   multivitamin with minerals tablet 1 tablet, 1 tablet, Oral, Daily, Rhyne, Samantha J, PA-C, 1 tablet at 05/25/21 0954   ondansetron  (ZOFRAN) injection 4 mg, 4 mg, Intravenous, Q6H PRN, Rhyne, Samantha J, PA-C   oxybutynin (DITROPAN) tablet 2.5 mg, 2.5 mg, Oral, BID, Rhyne, Samantha J, PA-C, 2.5 mg at 05/25/21 1005   oxyCODONE-acetaminophen (PERCOCET/ROXICET) 5-325 MG per tablet 1-2 tablet, 1-2 tablet, Oral, Q4H PRN, Rhyne, Samantha J, PA-C, 2 tablet at 05/25/21 1234   phenol (CHLORASEPTIC) mouth spray 1 spray, 1 spray, Mouth/Throat, PRN, Rhyne, Samantha J, PA-C   polyethylene glycol (MIRALAX / GLYCOLAX) packet 17 g, 17 g, Oral, Daily PRN, Rhyne, Samantha J, PA-C   potassium chloride SA (KLOR-CON) CR tablet 20-40 mEq, 20-40 mEq, Oral, Daily PRN, Rhyne, Samantha J, PA-C   thiamine tablet 100 mg, 100 mg, Oral, Daily, Rhyne, Samantha J, PA-C, 100 mg at 05/25/21 9629   Patients Current Diet:  Diet Order                  Diet Heart Room service appropriate? Yes; Fluid consistency: Thin  Diet effective now                         Precautions / Restrictions Precautions Precautions: Fall Precaution Comments: L AKA Other Brace: shrinker, limb guard (limb guard not in room during PT session 7/18 and may be from previous surgery, pt new AKA ace wrapped no visible drainage) Restrictions Weight Bearing Restrictions: Yes LLE Weight Bearing: Non weight bearing    Has the patient had 2 or more falls or a fall with injury in the past year? No   Prior Activity Level Community (5-7x/wk): Mod I with RW   Prior Functional Level Self Care: Did the patient need help bathing, dressing, using the toilet or eating? Independent   Indoor Mobility: Did the patient need assistance with walking from room to room (with or without device)? Independent   Stairs: Did the patient need assistance with internal or external stairs (with or without device)? Independent   Functional Cognition: Did the patient need help planning regular tasks such as shopping or remembering to take medications? [place]  Devices /  Paramedic Devices/Equipment: None Home Equipment: None   Prior Device Use: Indicate devices/aids used by the patient prior to current illness, exacerbation or injury? Walker   Current Functional Level Cognition   Arousal/Alertness: Awake/alert Overall Cognitive Status: Impaired/Different from baseline Orientation Level: Oriented X4 Following Commands: Follows one step commands with increased time, Follows multi-step commands inconsistently Safety/Judgement: Decreased awareness of safety, Decreased awareness of deficits General Comments: Pt internally distracted this date due to discomfort (at sacrum or under L hip? difficult to tell based on pt report), pt needs some repetition of 1-step cues and encouraged not to pull on condom cath line (pt somewhat restless), chair alarm on for safety and pt family entering room at end of session.    Extremity Assessment (includes Sensation/Coordination)   Upper Extremity Assessment: Defer to OT evaluation  Lower Extremity Assessment: Generalized weakness, LLE deficits/detail LLE Deficits / Details: Lt AKA     ADLs   Overall ADL's : Needs assistance/impaired Eating/Feeding: Set up, Bed level Grooming: Set up, Sitting Upper Body Bathing: Set up, Sitting Lower Body Bathing: Moderate assistance, Sitting/lateral leans, Bed level Upper Body Dressing : Set up, Sitting Lower Body Dressing: Moderate assistance, Sitting/lateral leans, Bed level Toileting- Clothing Manipulation and Hygiene: Maximal assistance, Sitting/lateral lean General ADL Comments: Pt with noted sitting/standing balance deficits, pain and confusion requiring increased assist for LB ADLs and transfers.     Mobility   Overal bed mobility: Needs Assistance Bed Mobility: Supine to Sit, Sit to Supine Supine to sit: Mod assist, +2 for safety/equipment, HOB elevated Sit to supine: Max assist General bed mobility comments: Assist to elevate trunk into sitting, use of bed  features/rail     Transfers   Overall transfer level: Needs assistance Equipment used: Rolling walker (2 wheeled) Transfer via Lift Equipment: Stedy Transfers: Sit to/from [employer] to Stand: Min assist, +2 physical assistance, +2 safety/equipment Stand pivot transfers:  (utilized [employer] for safety with pivot due to decreased focus/restlessness so totalA) Squat pivot transfers: Max assist  Lateral/Scoot Transfers: Min assist General transfer comment: PT provides knee block and cues for BUE support, cues for rocking; pt able to stand to/from [place] x2 for transfer to recliner, then able to stand from recliner to RW with +29mnA with poor standing posture ~30 sec.     Ambulation / Gait / Stairs / WProofreader/ Balance Dynamic Sitting Balance Sitting balance - Comments: reliant on UE support, pt restless and tending to lean multiple directions but no overt LOB with min guard/Supervision Balance Overall balance assessment: Needs assistance Sitting-balance support: Feet supported, Single extremity supported, Bilateral upper extremity supported Sitting balance-Leahy Scale: Poor Sitting balance - Comments: reliant on UE support, pt restless and tending to lean multiple directions but no overt LOB with min guard/Supervision Postural control: Posterior lean Standing balance support: Bilateral upper extremity supported Standing balance-Leahy Scale: Poor Standing balance comment: modA with knee block and BUE support at RW ~30 sec     Special needs/care consideration ETOH abuse pta long term    Previous Home Environment  Living Arrangements: Spouse/significant other  Lives With: Spouse, Family Available Help at Discharge: Available 24 hours/day (wife to take fmla) Type of Home: House Home Layout: Multi-level Alternate Level Stairs-Number of Steps: Reports his bed/bath in downstairs, unable to recall number of steps Home Access: Stairs to enter Entrance Stairs-Rails:  Right, Left Entrance Stairs-Number of Steps: 4 Bathroom Shower/Tub: TChiropodist Standard  Bathroom Accessibility: Yes How Accessible: Accessible via walker Home Care Services: No Additional Comments: History obtained from previous admission   Discharge Living Setting Plans for Discharge Living Setting: Patient's home, Lives with (comment) Type of Home at Discharge: House Discharge Home Layout: Multi-level Alternate Level Stairs-Number of Steps: 4 steps to bedroom Discharge Home Access: Stairs to enter Entrance Stairs-Rails: Right, Left, Can reach both Entrance Stairs-Number of Steps: 4 Discharge Bathroom Shower/Tub: Tub/shower unit Discharge Bathroom Toilet: Standard Discharge Bathroom Accessibility: Yes How Accessible: Accessible via walker Does the patient have any problems obtaining your medications?: No   Social/Family/Support Systems Patient Roles: Spouse Contact Information: wife Anticipated Caregiver: Wife Anticipated Caregiver's Contact Information: see above Ability/Limitations of Caregiver: wife taking fmla as needed Caregiver Availability: 24/7 Discharge Plan Discussed with Primary Caregiver: Yes Is Caregiver In Agreement with Plan?: Yes Does Caregiver/Family have Issues with Lodging/Transportation while Pt is in Rehab?: No   Goals Patient/Family Goal for Rehab: Mod I to superivsion with PT and OT Expected length of stay: 10-14 days Pt/Family Agrees to Admission and willing to participate: Yes Program Orientation Provided & Reviewed with Pt/Caregiver Including Roles  & Responsibilities: Yes   Decrease burden of Care through IP rehab admission: n/a   Possible need for SNF placement upon discharge: not anticipated   Patient Condition: I have reviewed medical records from [facility] , spoken with CM, and patient and spouse. I met with patient at the bedside for inpatient rehabilitation assessment.  Patient will benefit from ongoing PT  and OT, can actively participate in 3 hours of therapy a day 5 days of the week, and can make measurable gains during the admission.  Patient will also benefit from the coordinated team approach during an Inpatient Acute Rehabilitation admission.  The patient will receive intensive therapy as well as Rehabilitation physician, nursing, social worker, and care management interventions.  Due to bladder management, bowel management, safety, skin/wound care, disease management, medication administration, pain management, and patient education the patient requires 24 hour a day rehabilitation nursing.  The patient is currently mod assist overall with mobility and basic ADLs.  Discharge setting and therapy post discharge at home with home health is anticipated.  Patient has agreed to participate in the Acute Inpatient Rehabilitation Program and will admit today.   Preadmission Screen Completed By:  Cleatrice Burke, 05/25/2021 1:38 PM ______________________________________________________________________   Discussed status with Dr. Naaman Plummer on  05/25/2021 at 1338 and received approval for admission today.   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 Yes Co-Morbidities requiring supervision/potential complications: htn, dm, pad Due to bladder management, bowel management, safety, skin/wound care, disease management, medication administration, pain management, and patient education, does the patient require 24 hr/day rehab nursing? Yes Does the patient require coordinated care of a physician, rehab nurse, PT, OT to address physical and functional deficits in the context of the above medical diagnosis(es)? Yes Addressing deficits  in the following areas: balance, endurance, locomotion, strength, transferring, bowel/bladder control, bathing, dressing, feeding, grooming, toileting, and psychosocial support Can the patient actively participate in an intensive therapy program of at least 3 hrs of therapy 5 days a week? Yes The potential for patient to make measurable gains while on inpatient rehab  is excellent Anticipated functional outcomes upon discharge from inpatient rehab: modified independent and supervision PT, modified independent and supervision OT, n/a SLP Estimated rehab length of stay to reach the above functional goals is: 10-14 days Anticipated discharge destination: Home 10. Overall Rehab/Functional Prognosis: excellent     MD Signature: [name], MD, [place] [facility] 05/25/2021           Revision History                                  Note Details  Author [name], MD File Time 05/25/2021  1:45 PM  Author Type Physician Status Signed  Last Editor [name], MD Service Physical Medicine and [place] # [phone] Admit Date 05/25/2021

## 2021-05-25 NOTE — Progress Notes (Signed)
Inpatient Rehabilitation Admissions Coordinator   I have VA approval to readmit to CIR today. I have alerted acute team and TOC . I will make the arrangements to admit today. I met with patient and wife at bedside and they are in agreement.  Danne Baxter, RN, MSN Rehab Admissions Coordinator [phone] 05/25/2021 1:35 PM

## 2021-05-25 NOTE — Progress Notes (Signed)
Inpatient Rehabilitation Admissions Coordinator   I have faxed updated labs and clinicals to the [facility] this morning to pursue VA insurance Auth for possible readmit to CIR today pending VA insurance approval.  Ottie Glazier, RN, MSN Rehab Admissions Coordinator [phone] 05/25/2021 8:46 AM

## 2021-05-25 NOTE — Progress Notes (Addendum)
  Progress Note    05/25/2021 7:01 AM 5 Days Post-Op  Subjective:  pt has been out of bed.  No pain in his stump  Afebrile HR 70's-80's NSR 100's-120's systolic 100% RA  Vitals:   05/24/21 2300 05/25/21 0300  BP: 103/68 123/71  Pulse:    Resp: 14 18  Temp: 98.1 F (36.7 C) 98.1 F (36.7 C)  SpO2: 100% 100%    Physical Exam: Incisions:  dressing removed.  Some bloody drainage present.  No active drainage with dressing off.  Clean and staples in tact.    CBC    Component Value Date/Time   WBC 9.1 05/22/2021 0135   RBC 2.47 (L) 05/22/2021 0135   HGB 9.1 (L) 05/22/2021 2214   HCT 27.8 (L) 05/22/2021 2214   PLT 455 (H) 05/22/2021 0135   MCV 85.4 05/22/2021 0135   MCH 27.1 05/22/2021 0135   MCHC 31.8 05/22/2021 0135   RDW 15.3 05/22/2021 0135   LYMPHSABS 0.7 05/18/2021 0712   MONOABS 1.0 05/18/2021 0712   EOSABS 0.0 05/18/2021 0712   BASOSABS 0.0 05/18/2021 0712    BMET    Component Value Date/Time   NA 129 (L) 05/22/2021 0135   K 4.0 05/22/2021 0135   CL 97 (L) 05/22/2021 0135   CO2 27 05/22/2021 0135   GLUCOSE 112 (H) 05/22/2021 0135   BUN 34 (H) 05/22/2021 0135   CREATININE 0.88 05/22/2021 0135   CALCIUM 8.7 (L) 05/22/2021 0135   GFRNONAA >60 05/22/2021 0135    INR No results found for: INR   Intake/Output Summary (Last 24 hours) at 05/25/2021 0701 Last data filed at 05/24/2021 1800 Gross per 24 hour  Intake 1400 ml  Output 780 ml  Net 620 ml     Assessment/Plan:  75 y.o. male is s/p left above knee amputation  5 Days Post-Op  -pt's left AKA looks good.  Would continue 4x4, kerlix and gentle ace wrap daily. -[facility] requesting updated hgb-this has been ordered stat.  -should return to CIR today.   Doreatha Massed, PA-C [facility] [phone] 05/25/2021 7:01 AM    I have seen and evaluated the patient. I agree with the PA note as documented above.  Left above-knee amputation looks good and dressing removed this morning.   Would like to get him back to CIR pending approval.  No issues over the weekend.  Cephus Shelling, MD [facility] [place] Office: [phone]

## 2021-05-25 NOTE — Progress Notes (Signed)
Inpatient Rehabilitation Medication Review by a Pharmacist  A complete drug regimen review was completed for this patient to identify any potential clinically significant medication issues.  Clinically significant medication issues were identified:  no   Type of Medication Issue Identified Description of Issue Urgent (address now) Non-Urgent (address on AM team rounds) Plan Plan Accepted by Provider? (Yes / No / Pending AM Rounds)  Drug Interaction(s) (clinically significant)       Duplicate Therapy       Allergy       No Medication Administration End Date       Incorrect Dose       Additional Drug Therapy Needed       Other  Metformin, cyanocobalamin, robaxin and HCTZ were not restarted during admission and have not been restarted upon rehab admit. Metformin is addressed in note to restart prn, but others are not. Non-urgent  pending    For non-urgent medication issues to be resolved on team rounds tomorrow morning a [facility] Secure Chat Handoff was sent to:    Pharmacist comments:   Time spent performing this drug regimen review (minutes):  20    Thank you for allowing [place] to participate in this patients care. Signe Colt, PharmD 05/25/2021 10:08 PM  Please check [email or web address] for unit-specific pharmacy phone numbers.

## 2021-05-25 NOTE — Discharge Instructions (Addendum)
Inpatient Rehab Discharge Instructions  Todd Duran Discharge date and time: No discharge date for patient encounter.   Activities/Precautions/ Functional Status: Activity: activity as tolerated Diet: diabetic diet Wound Care: Routine skin checks Functional status:  ___ No restrictions     ___ Walk up steps independently ___ 24/7 supervision/assistance   ___ Walk up steps with assistance ___ Intermittent supervision/assistance  ___ Bathe/dress independently ___ Walk with walker     _x__ Bathe/dress with assistance ___ Walk Independently    ___ Shower independently ___ Walk with assistance    ___ Shower with assistance ___ No alcohol     ___ Return to work/school ________  COMMUNITY REFERRALS UPON DISCHARGE:    Home Health:   PT     OT       RN    SNA                      Agency:[facility]  Phone:[phone] *Please expect follow-up within 2-3 days of discharge to schedule your home visit. I    Medical Equipment/Items Ordered:                                                 Agency/Supplier:  GENERAL COMMUNITY RESOURCES FOR PATIENT/FAMILY:   Special Instructions: No driving smoking or alcohol   My questions have been answered and I understand these instructions. I will adhere to these goals and the provided educational materials after my discharge from the hospital.  Patient/Caregiver Signature _______________________________ Date __________  Clinician Signature _______________________________________ Date __________  Please bring this form and your medication list with you to all your follow-up doctor's appointments.

## 2021-05-25 NOTE — Progress Notes (Signed)
Physical Therapy Treatment Patient Details Name: Todd Duran MRN: [id number] DOB: 1946-01-03 Today's Date: 05/25/2021    History of Present Illness Patient is a 75 year old male with recent L BKA and discharge to CIR. 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 PMH includes DM, HTN.    PT Comments    Pt received in supine, agreeable to therapy session and with good participation and tolerance for transfer training and seated exercises. Pt distracted due to L glute or sacral discomfort, assisted pt for perineal care but no skin breakdown observed during session, NT notified pt may desire a bath soon. Emphasis on safe hand placement with transfers, positioning to prevent skin breakdown and therapeutic exercises for strengthening (HEP given). Pt continues to benefit from PT services to progress toward functional mobility goals.   Follow Up Recommendations  CIR     Equipment Recommendations  Wheelchair (measurements PT);Wheelchair cushion (measurements PT)    Recommendations for Other Services       Precautions / Restrictions Precautions Precautions: Fall Precaution Comments: L AKA Required Braces or Orthoses: Other Brace Other Brace: shrinker, limb guard (limb guard not in room during PT session 7/18 and may be from previous surgery, pt new AKA ace wrapped no visible drainage) Restrictions Weight Bearing Restrictions: Yes LLE Weight Bearing: Non weight bearing    Mobility  Bed Mobility Overal bed mobility: Needs Assistance Bed Mobility: Supine to Sit;Sit to Supine     Supine to sit: Mod assist;+2 for safety/equipment;HOB elevated     General bed mobility comments: Assist to elevate trunk into sitting, use of bed features/rail    Transfers Overall transfer level: Needs assistance Equipment used: Rolling walker (2 wheeled) Transfers: Sit to/from Stand Sit to Stand: Min assist;+2 physical assistance;+2 safety/equipment Stand pivot  transfers:  (utilized [employer] for safety with pivot due to decreased focus/restlessness so totalA)       General transfer comment: PT provides knee block and cues for BUE support, cues for rocking; pt able to stand to/from [place] x2 for transfer to recliner, then able to stand from recliner to RW with +17minA with poor standing posture ~30 sec.  Ambulation/Gait                 Stairs             Wheelchair Mobility    Modified Rankin (Stroke Patients Only)       Balance Overall balance assessment: Needs assistance Sitting-balance support: Feet supported;Single extremity supported;Bilateral upper extremity supported Sitting balance-Leahy Scale: Poor Sitting balance - Comments: reliant on UE support, pt restless and tending to lean multiple directions but no overt LOB with min guard/Supervision Postural control: Posterior lean Standing balance support: Bilateral upper extremity supported Standing balance-Leahy Scale: Poor Standing balance comment: modA with knee block and BUE support at RW ~30 sec                            Cognition Arousal/Alertness: Awake/alert Behavior During Therapy: Restless Overall Cognitive Status: Impaired/Different from baseline Area of Impairment: Safety/judgement;Awareness;Problem solving;Memory                     Memory: Decreased short-term memory Following Commands: Follows one step commands with increased time;Follows multi-step commands inconsistently Safety/Judgement: Decreased awareness of safety;Decreased awareness of deficits Awareness: Intellectual Problem Solving: Slow processing;Requires verbal cues;Requires tactile cues General Comments: Pt internally distracted this date due to discomfort (  at sacrum or under L hip? difficult to tell based on pt report), pt needs some repetition of 1-step cues and encouraged not to pull on condom cath line (pt somewhat restless), chair alarm on for safety and pt family  entering room at end of session.      Exercises Amputee Exercises Hip Extension: AAROM;Left;5 reps;Seated (tcs for technique, pt will need reinforcement) Hip Flexion/Marching: AROM;Right;10 reps;Seated Chair Push Up:  (vcs but pt too fatigued to attempt after multiple STS transfers) Other Exercises Other Exercises: long arc quad RLE x10 reps seated Other Exercises: amputee HEP handout printed    General Comments General comments (skin integrity, edema, etc.): BP 123/75 (81) supine and no dizziness reported with transfer; HR 88 bpm and VSS on RA      Pertinent Vitals/Pain Pain Assessment: 0-10 Pain Score: 8  Pain Location: L residual limb, L buttock/hip while resting in bed and chair Pain Descriptors / Indicators: Grimacing;Guarding;Discomfort;Pins and needles (R LE some tingling) Pain Intervention(s): Limited activity within patient's tolerance;Monitored during session;Premedicated before session;Repositioned (pt defers ice pack)           PT Goals (current goals can now be found in the care plan section) Acute Rehab PT Goals Patient Stated Goal: less pain PT Goal Formulation: With patient Time For Goal Achievement: 06/05/21 Potential to Achieve Goals: Good Progress towards PT goals: Progressing toward goals    Frequency    Min 3X/week      PT Plan Current plan remains appropriate       AM-PAC PT "6 Clicks" Mobility   Outcome Measure  Help needed turning from your back to your side while in a flat bed without using bedrails?: A Little Help needed moving from lying on your back to sitting on the side of a flat bed without using bedrails?: A Lot Help needed moving to and from a bed to a chair (including a wheelchair)?: A Lot Help needed standing up from a chair using your arms (e.g., wheelchair or bedside chair)?: A Lot Help needed to walk in hospital room?: Total Help needed climbing 3-5 steps with a railing? : Total 6 Click Score: 11    End of Session  Equipment Utilized During Treatment: Gait belt Activity Tolerance: Patient tolerated treatment well;Patient limited by pain Patient left: with call bell/phone within reach;in chair;with chair alarm set;with family/visitor present Nurse Communication: Mobility status;Need for lift equipment (pt does well with Antony Salmon this date) PT Visit Diagnosis: Other abnormalities of gait and mobility (R26.89);Muscle weakness (generalized) (M62.81)     Time: 4259-5638 PT Time Calculation (min) (ACUTE ONLY): 25 min  Charges:  $Therapeutic Exercise: 8-22 mins $Therapeutic Activity: 8-22 mins                     Keshawn Sundberg P., PTA Acute Rehabilitation Services Pager: [phone] Office: [phone]    Angus Palms 05/25/2021, 12:39 PM

## 2021-05-25 NOTE — Progress Notes (Signed)
Mobility Specialist: Progress Note   05/25/21 1131  Mobility  Activity Transferred:  Bed to chair  Level of Assistance Minimal assist, patient does 75% or more  Assistive Device Stedy  Mobility Out of bed to chair with meals  Mobility Response Tolerated well  Mobility performed by Mobility specialist;Other (comment) (PTA Carly)  Bed Position Chair  $Mobility charge 1 Mobility   Pre-Mobility: 87 HR, 123/75 BP Post-Mobility: 87 HR  Pt was able to transfer to the chair using the Stedy. Pt independent with bed mobility with some verbal cues and required minA to stand, +2 for safety/equipment. Pt c/o 8/10 pain in his L stump during transfer. Pt to the recliner with call bell in reach.   [facility] Amandalynn Pitz Mobility Specialist Mobility Specialist Phone: [phone]

## 2021-05-25 NOTE — H&P (Signed)
Physical Medicine and Rehabilitation Admission H&P       HPI: Todd Duran is a 75 year old right-handed male with history of hypertension, diabetes mellitus, tobacco and alcohol use.  Per chart review lives with spouse.  Independent prior to admission.  Multilevel home 4 steps to entry.  Spouse works during the day.  Presented 05/13/2021 with progressive left foot pain x2 months with ischemic changes and findings of decreased femoral pulses bilaterally.  Underwent ultrasound-guided left femoral artery with abdominal aortogram showing bilateral renal artery stenosis.  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 Myra Gianotti.  [facility] course anemia as well as mild hyponatremia 130-131.  He was cleared to begin subcutaneous heparin for DVT prophylaxis.  His therapy evaluations completed and due to decreased functional mobility was admitted to inpatient rehab services 05/15/2021.  Patient with slow progressive gains and pain management as directed.  Close monitoring of BKA site noted boggy necrotic area concerning along incision line as well as leukocytosis [id number] with vascular surgery consulted and if you have staples were removed noted underlying abscess with poor healing and increased necrosis.  Due to these ischemic changes patient was discharged to acute care services 05/20/2021 and underwent left AKA per Dr. Chestine Spore.  Postoperative course anemia 6.7 05/22/2021 with orders to transfuse 2 units packed red blood cells and close monitoring of sodium 129 and leukocytosis improved 9100 as well as hemoglobin 9.0.  He remained on subcutaneous heparin for DVT prophylaxis.  Therapy evaluations again completed postoperatively recommendations of CIR and patient was admitted to inpatient rehab services to continue comprehensive therapies.   Review of Systems Constitutional:  Negative for chills and fever. HENT:  Negative  for hearing loss.   Eyes:  Negative for blurred vision, double vision and discharge. Respiratory:  Negative for cough and shortness of breath.   Cardiovascular:  Positive for leg swelling. Negative for chest pain and palpitations. Gastrointestinal:  Positive for constipation. Negative for heartburn, nausea and vomiting. Genitourinary:  Positive for urgency. Negative for dysuria and hematuria. Musculoskeletal:  Positive for joint pain and myalgias. Skin:  Negative for rash.  All other systems reviewed and are negative.     Past Medical History:  Diagnosis Date   Diabetes mellitus without complication (HCC)     Hypertension           Past Surgical History:  Procedure Laterality Date   ABDOMINAL AORTOGRAM W/LOWER EXTREMITY N/A 05/12/2021    Procedure: ABDOMINAL AORTOGRAM W/LOWER EXTREMITY;  Surgeon: Nada Libman, MD;  Location: [facility] INVASIVE CV LAB;  Service: Cardiovascular;  Laterality: N/A;   AMPUTATION Left 05/13/2021    Procedure: AMPUTATION BELOW KNEE LEFT ;  Surgeon: Nada Libman, MD;  Location: [facility] OR;  Service: Vascular;  Laterality: Left;   AMPUTATION Left 05/20/2021    Procedure: AMPUTATION ABOVE KNEE;  Surgeon: Cephus Shelling, MD;  Location: [facility] OR;  Service: Vascular;  Laterality: Left;   HERNIA REPAIR       KNEE SURGERY        History reviewed. No pertinent family history. Social History:  reports that he has been smoking cigarettes. He has been smoking an average of .5 packs per day. He has never used smokeless tobacco. No history on file for alcohol use and drug use. Allergies:       Allergies  Allergen Reactions   Lisinopril Swelling      Swelling  of lips          Medications Prior to Admission  Medication Sig Dispense Refill   amLODipine (NORVASC) 10 MG tablet Take 10 mg by mouth daily.       aspirin 325 MG tablet Take 325 mg by mouth daily.       atenolol (TENORMIN) 100 MG tablet Take 100 mg by mouth daily.       atorvastatin (LIPITOR) 20 MG tablet Take  20 mg by mouth at bedtime.       cyanocobalamin 1000 MCG tablet Take 1,000 mcg by mouth daily.       hydrochlorothiazide (HYDRODIURIL) 25 MG tablet Take 25 mg by mouth daily.       metFORMIN (GLUCOPHAGE) 500 MG tablet Take 500 mg by mouth daily with breakfast.       methocarbamol (ROBAXIN) 750 MG tablet Take 750 mg by mouth 3 (three) times daily.       oxybutynin (DITROPAN) 5 MG tablet Take 0.5 tablets (2.5 mg total) by mouth 2 (two) times daily.          Drug Regimen Review Drug regimen was reviewed and remains appropriate with no significant issues identified   Home: Home Living Family/patient expects to be discharged to:: Private residence Living Arrangements: Spouse/significant other, Children Available Help at Discharge: Available PRN/intermittently, Available 24 hours/day (wife taking FMLA) Type of Home: House Home Access: Stairs to enter Entergy CorporationEntrance Stairs-Number of Steps: 4 Entrance Stairs-Rails: Right, Left Home Layout: Multi-level Alternate Level Stairs-Number of Steps: Reports his bed/bath in downstairs, unable to recall number of steps Bathroom Shower/Tub: Armed forces operational officerTub/shower unit Bathroom Toilet: Standard Bathroom Accessibility: Yes Home Equipment: None Additional Comments: History obtained from previous admission  Lives With: Spouse, Family   Functional History: Prior Function Level of Independence: Independent Comments: Prior to initial BKA, pt was independent in all tasks   Functional Status:  Mobility: Bed Mobility Overal bed mobility: Needs Assistance Bed Mobility: Supine to Sit, Sit to Supine Supine to sit: HOB elevated, Min assist Sit to supine: Min assist General bed mobility comments: Assist to elevate trunk into sitting Transfers Overall transfer level: Needs assistance Equipment used: None Transfers: Lateral/Scoot Transfers Sit to Stand: Mod assist, +2 physical assistance, +2 safety/equipment  Lateral/Scoot Transfers: Min assist General transfer comment: Pt  scooted bed to recliner to bed with min assist using bed pad under pt to assist scooting   ADL: ADL Overall ADL's : Needs assistance/impaired Eating/Feeding: Set up, Bed level Grooming: Set up, Sitting Upper Body Bathing: Set up, Sitting Lower Body Bathing: Moderate assistance, Sitting/lateral leans, Bed level Upper Body Dressing : Set up, Sitting Lower Body Dressing: Moderate assistance, Sitting/lateral leans, Bed level Toileting- Clothing Manipulation and Hygiene: Maximal assistance, Sitting/lateral lean General ADL Comments: Pt with noted sitting/standing balance deficits, pain and confusion requiring increased assist for LB ADLs and transfers.   Cognition: Cognition Overall Cognitive Status: Impaired/Different from baseline Arousal/Alertness: Awake/alert Orientation Level: Oriented to person, Oriented to place, Disoriented to time Cognition Arousal/Alertness: Awake/alert, Lethargic Behavior During Therapy: Flat affect Overall Cognitive Status: Impaired/Different from baseline Area of Impairment: Orientation, Safety/judgement, Awareness, Problem solving, Following commands, Memory Orientation Level: Disoriented to, Place, Time Memory: Decreased short-term memory Following Commands: Follows one step commands with increased time Safety/Judgement: Decreased awareness of safety Awareness: Intellectual Problem Solving: Slow processing, Difficulty sequencing, Requires verbal cues General Comments: Pt with intermittent confusion about location, able to answer he is at hospital but confused by timeline of rehab, surgery, etc. Slower processing and flat affect, increased  time to follow directions but attempts all tasks. Cues for safety and sequencing   Physical Exam: Blood pressure 124/74, pulse 85, temperature 98 F (36.7 C), temperature source Oral, resp. rate 18, height 5\' 11"  (1.803 m), weight 55.7 kg, SpO2 99 %. Physical Exam Constitutional:      Appearance: Normal  appearance. HENT:    Head: Normocephalic.    Right Ear: Tympanic membrane normal.    Mouth/Throat:    Mouth: Mucous membranes are moist.    Pharynx: Oropharynx is clear.    Comments: Missing teeth.  Eyes:    Extraocular Movements: Extraocular movements intact.    Pupils: Pupils are equal, round, and reactive to light. Cardiovascular:    Rate and Rhythm: Normal rate and regular rhythm.    Heart sounds: No murmur heard. Pulmonary:    Effort: Pulmonary effort is normal. No respiratory distress.    Breath sounds: No wheezing. Abdominal:    General: Bowel sounds are normal. There is no distension.    Tenderness: There is no abdominal tenderness. Skin:    General: Skin is warm.    Comments: Left AKA site postoperative dressing in place appropriately tender. Mild serosanguinous drainage from incision which is well approximated.   Neurological:    General: No focal deficit present.    Mental Status: He is alert and oriented to person, place, and time.    Cranial Nerves: No cranial nerve deficit.    Comments: Patient is alert no acute distress oriented x3 and follows commands. UE 5/5. RLE 4-5/5. LLE: HF 3/5. No focal sensory deficits  Psychiatric:        Mood and Affect: Mood normal.        Behavior: Behavior normal.        Thought Content: Thought content normal.     Lab Results Last 48 Hours        Results for orders placed or performed during the hospital encounter of 05/20/21 (from the past 48 hour(s))  Glucose, capillary     Status: None    Collection Time: 05/20/21  2:39 PM  Result Value Ref Range    Glucose-Capillary 95 70 - 99 mg/dL      Comment: Glucose reference range applies only to samples taken after fasting for at least 8 hours.  Glucose, capillary     Status: None    Collection Time: 05/20/21  5:23 PM  Result Value Ref Range    Glucose-Capillary 94 70 - 99 mg/dL      Comment: Glucose reference range applies only to samples taken after fasting for at least 8 hours.   Basic metabolic panel     Status: Abnormal    Collection Time: 05/21/21  1:27 AM  Result Value Ref Range    Sodium 129 (L) 135 - 145 mmol/L    Potassium 3.7 3.5 - 5.1 mmol/L    Chloride 96 (L) 98 - 111 mmol/L    CO2 25 22 - 32 mmol/L    Glucose, Bld 119 (H) 70 - 99 mg/dL      Comment: Glucose reference range applies only to samples taken after fasting for at least 8 hours.    BUN 40 (H) 8 - 23 mg/dL    Creatinine, Ser 05/23/21 0.61 - 1.24 mg/dL    Calcium 8.8 (L) 8.9 - 10.3 mg/dL    GFR, Estimated 9.83 >38 mL/min      Comment: (NOTE) Calculated using the CKD-EPI Creatinine Equation (2021)      Anion gap 8 5 -  15      Comment: Performed at [facility], [address], [place], [place] [address]  CBC     Status: Abnormal    Collection Time: 05/21/21  1:27 AM  Result Value Ref Range    WBC 15.0 (H) 4.0 - 10.5 K/uL    RBC 2.72 (L) 4.22 - 5.81 MIL/uL    Hemoglobin 7.3 (L) 13.0 - 17.0 g/dL    HCT 13.0 (L) 86.5 - 52.0 %    MCV 84.6 80.0 - 100.0 fL    MCH 26.8 26.0 - 34.0 pg    MCHC 31.7 30.0 - 36.0 g/dL    RDW 78.4 69.6 - 29.5 %    Platelets 474 (H) 150 - 400 K/uL    nRBC 0.0 0.0 - 0.2 %      Comment: Performed at [facility], [address], [place], [place] [address]  Glucose, capillary     Status: Abnormal    Collection Time: 05/21/21  6:07 AM  Result Value Ref Range    Glucose-Capillary 132 (H) 70 - 99 mg/dL      Comment: Glucose reference range applies only to samples taken after fasting for at least 8 hours.  Glucose, capillary     Status: Abnormal    Collection Time: 05/21/21 11:38 AM  Result Value Ref Range    Glucose-Capillary 249 (H) 70 - 99 mg/dL      Comment: Glucose reference range applies only to samples taken after fasting for at least 8 hours.    Comment 1 Notify RN      Comment 2 Document in Chart    Glucose, capillary     Status: Abnormal    Collection Time: 05/21/21  4:02 PM  Result Value Ref Range    Glucose-Capillary 120 (H) 70 - 99 mg/dL       Comment: Glucose reference range applies only to samples taken after fasting for at least 8 hours.    Comment 1 Notify RN      Comment 2 Document in Chart    Glucose, capillary     Status: Abnormal    Collection Time: 05/21/21  8:37 PM  Result Value Ref Range    Glucose-Capillary 198 (H) 70 - 99 mg/dL      Comment: Glucose reference range applies only to samples taken after fasting for at least 8 hours.  Basic metabolic panel     Status: Abnormal    Collection Time: 05/22/21  1:35 AM  Result Value Ref Range    Sodium 129 (L) 135 - 145 mmol/L    Potassium 4.0 3.5 - 5.1 mmol/L    Chloride 97 (L) 98 - 111 mmol/L    CO2 27 22 - 32 mmol/L    Glucose, Bld 112 (H) 70 - 99 mg/dL      Comment: Glucose reference range applies only to samples taken after fasting for at least 8 hours.    BUN 34 (H) 8 - 23 mg/dL    Creatinine, Ser 2.44 0.61 - 1.24 mg/dL    Calcium 8.7 (L) 8.9 - 10.3 mg/dL    GFR, Estimated >01 >02 mL/min      Comment: (NOTE) Calculated using the CKD-EPI Creatinine Equation (2021)      Anion gap 5 5 - 15      Comment: Performed at [facility], [address] [place], [place] [address]  CBC     Status: Abnormal    Collection Time: 05/22/21  1:35 AM  Result Value Ref Range    WBC 9.1 4.0 - 10.5 K/uL    RBC 2.47 (L) 4.22 - 5.81 MIL/uL    Hemoglobin 6.7 (LL) 13.0 - 17.0 g/dL      Comment: REPEATED TO VERIFY THIS CRITICAL RESULT HAS VERIFIED AND BEEN CALLED TO L LOUIS RN BY JAMIE KIRBY ON 07 15 2022 AT 0231, AND HAS BEEN READ BACK.      HCT 21.1 (L) 39.0 - 52.0 %    MCV 85.4 80.0 - 100.0 fL    MCH 27.1 26.0 - 34.0 pg    MCHC 31.8 30.0 - 36.0 g/dL    RDW 68.0 32.1 - 22.4 %    Platelets 455 (H) 150 - 400 K/uL    nRBC 0.2 0.0 - 0.2 %      Comment: Performed at [facility], [address] [place], [place] [address]      Imaging Results (Last 48 hours)  No results found.           Medical Problem List and Plan: 1.  Debility secondary to left BKA 05/13/2021  with necrotic changes revised to left AKA 05/20/2021             -patient may  shower if left leg is covered             -ELOS/Goals: 10-14 days, supervision to mod I 2.  Antithrombotics: -DVT/anticoagulation: Subcutaneous heparin             -antiplatelet therapy: Aspirin 81 mg daily 3. Pain Management: Oxycodone as needed             -massage and elevation to control pain/edema as well. 4. Mood: Provide emotional support             -antipsychotic agents: N/A 5. Neuropsych: This patient is capable of making decisions on his own behalf. 6. Skin/Wound Care: Routine skin checks             -dry dressing and ACE wrap to stump daily---transition to shrinker 7. Fluids/Electrolytes/Nutrition: Routine in and outs with follow-up chemistries 8.  Acute blood loss anemia.  Transfused 2 units 05/22/2021.  Follow-up CBC 9.  Hypertension.  Norvasc 10 mg daily, Tenormin 25 mg twice daily.  Monitor with increased mobility 10.  Hyperlipidemia.  Lipitor 11.  Diabetes mellitus.  Hemoglobin A1c 5.2.  SSI.  Patient on Glucophage 500 mg daily prior to admission.   -check cbgs ac and hs. -Resume as needed 12.  History of tobacco alcohol use.  Provide counseling 13.  Hyponatremia.  Follow-up chemistries upon admission tomorrow       Charlton Amor, PA-C 05/22/2021   I have personally performed a face to face diagnostic evaluation of this patient and formulated the key components of the plan.  Additionally, I have personally reviewed laboratory data, imaging studies, as well as relevant notes and concur with the physician assistant's documentation above.  The patient's status has not changed from the original H&P.  Any changes in documentation from the acute care chart have been noted above.  Ranelle Oyster, MD, [place]

## 2021-05-26 DIAGNOSIS — Z89612 Acquired absence of left leg above knee: Secondary | ICD-10-CM | POA: Diagnosis not present

## 2021-05-26 DIAGNOSIS — I10 Essential (primary) hypertension: Secondary | ICD-10-CM

## 2021-05-26 DIAGNOSIS — D62 Acute posthemorrhagic anemia: Secondary | ICD-10-CM | POA: Diagnosis not present

## 2021-05-26 DIAGNOSIS — I739 Peripheral vascular disease, unspecified: Secondary | ICD-10-CM | POA: Diagnosis not present

## 2021-05-26 LAB — COMPREHENSIVE METABOLIC PANEL
ALT: 24 U/L (ref 0–44)
AST: 31 U/L (ref 15–41)
Albumin: 1.5 g/dL — ABNORMAL LOW (ref 3.5–5.0)
Alkaline Phosphatase: 311 U/L — ABNORMAL HIGH (ref 38–126)
Anion gap: 6 (ref 5–15)
BUN: 23 mg/dL (ref 8–23)
CO2: 25 mmol/L (ref 22–32)
Calcium: 8.8 mg/dL — ABNORMAL LOW (ref 8.9–10.3)
Chloride: 102 mmol/L (ref 98–111)
Creatinine, Ser: 0.77 mg/dL (ref 0.61–1.24)
GFR, Estimated: >60 mL/min (ref >60)
Glucose, Bld: 113 mg/dL — ABNORMAL HIGH (ref 70–99)
Potassium: 4 mmol/L (ref 3.5–5.1)
Sodium: 133 mmol/L — ABNORMAL LOW (ref 135–145)
Total Bilirubin: 0.5 mg/dL (ref 0.3–1.2)
Total Protein: 6.5 g/dL (ref 6.5–8.1)

## 2021-05-26 LAB — CBC WITH DIFFERENTIAL/PLATELET
Abs Immature Granulocytes: 0.07 10*3/uL (ref 0.00–0.07)
Basophils Absolute: 0 10*3/uL (ref 0.0–0.1)
Basophils Relative: 0 %
Eosinophils Absolute: 0.1 10*3/uL (ref 0.0–0.5)
Eosinophils Relative: 1 %
HCT: 26.5 % — ABNORMAL LOW (ref 39.0–52.0)
Hemoglobin: 8.4 g/dL — ABNORMAL LOW (ref 13.0–17.0)
Immature Granulocytes: 1 %
Lymphocytes Relative: 17 %
Lymphs Abs: 1.2 10*3/uL (ref 0.7–4.0)
MCH: 26.8 pg (ref 26.0–34.0)
MCHC: 31.7 g/dL (ref 30.0–36.0)
MCV: 84.7 fL (ref 80.0–100.0)
Monocytes Absolute: 0.4 10*3/uL (ref 0.1–1.0)
Monocytes Relative: 6 %
Neutro Abs: 5.3 10*3/uL (ref 1.7–7.7)
Neutrophils Relative %: 75 %
Platelets: 513 10*3/uL — ABNORMAL HIGH (ref 150–400)
RBC: 3.13 MIL/uL — ABNORMAL LOW (ref 4.22–5.81)
RDW: 15.4 % (ref 11.5–15.5)
WBC: 7 10*3/uL (ref 4.0–10.5)
nRBC: 0 % (ref 0.0–0.2)

## 2021-05-26 LAB — GLUCOSE, CAPILLARY
Glucose-Capillary: 119 mg/dL — ABNORMAL HIGH (ref 70–99)
Glucose-Capillary: 119 mg/dL — ABNORMAL HIGH (ref 70–99)
Glucose-Capillary: 126 mg/dL — ABNORMAL HIGH (ref 70–99)
Glucose-Capillary: 162 mg/dL — ABNORMAL HIGH (ref 70–99)

## 2021-05-26 MED ORDER — SORBITOL 70 % SOLN
60.0000 mL | Status: AC
  Administered 2021-05-26: 60 mL via ORAL
  Filled 2021-05-26: qty 60

## 2021-05-26 NOTE — Progress Notes (Addendum)
Patient ID: Todd Duran, male   DOB: [birth year], 75 y.o.   MRN: [id number]  SW familiar with pt as he is a return patient. Pt went to acute on 7/13 due to concerns related to L BKA. Pt now L AKA. Please refer to assessment completed on 05/15/2021. SW to confirm there are no barriers to d/c.   SW spoke with pt wife Todd Duran [phone]) to provide updates from team conference and ELOS 10-14 days. Discussed if there are any changes with regard to support. She states she is the primary caregiver, but their son that is not working may be able to help, and there have also been family that have stated they are willing to help as well. She said she is going to confirm with everyone who can help. She also states she will be here to visit pt this afternoon, as she does not drive and relies on others for transportation. SW informed will f/u once there is a d/c date set.   SW left message for [place] [phone]) to inform on pt d/c date, and requested aide program for patient.   SW met with pt in room to discuss above. Pt would like to leave sooner than 14 days if possible, however, willing to stay if he needs too.   Loralee Pacas, MSW, [place] Office: [phone] Cell: [phone] Fax: [phone]

## 2021-05-26 NOTE — Evaluation (Signed)
Occupational Therapy Assessment and Plan  Patient Details  Name: Todd Duran MRN: [id number] Date of Birth: Feb 03, 1946  OT Diagnosis: acute pain, muscle weakness (generalized), and new AKA Rehab Potential:   Good ELOS:   12-14 days  Today's Date: 05/26/2021 OT Individual Time: 5885-0277 OT Individual Time Calculation (min): 60 min     Hospital Problem: Active Problems:   Left above-knee amputee [facility])   Past Medical History:  Past Medical History:  Diagnosis Date   Diabetes mellitus without complication ([place])    Hypertension    Past Surgical History:  Past Surgical History:  Procedure Laterality Date   ABDOMINAL AORTOGRAM W/LOWER EXTREMITY N/A 05/12/2021   Procedure: ABDOMINAL AORTOGRAM W/LOWER EXTREMITY;  Surgeon: Serafina Mitchell, MD;  Location: [place] [facility];  Service: Cardiovascular;  Laterality: N/A;   AMPUTATION Left 05/13/2021   Procedure: AMPUTATION BELOW KNEE LEFT ;  Surgeon: Serafina Mitchell, MD;  Location: [facility] OR;  Service: Vascular;  Laterality: Left;   AMPUTATION Left 05/20/2021   Procedure: AMPUTATION ABOVE KNEE;  Surgeon: Marty Heck, MD;  Location: [facility] OR;  Service: Vascular;  Laterality: Left;   HERNIA REPAIR     KNEE SURGERY      Assessment & Plan Clinical Impression: Patient is a 75 y.o. year old male  with history of hypertension, diabetes mellitus, tobacco and alcohol use.  Per chart review lives with spouse.  Independent prior to admission.  Multilevel home 4 steps to entry.  Spouse works during the day.  Presented 05/13/2021 with progressive left foot pain x2 months with ischemic changes and findings of decreased femoral pulses bilaterally.  Underwent ultrasound-guided left femoral artery with abdominal aortogram showing bilateral renal artery stenosis.  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 Trula Slade.  [facility] course anemia as well as mild hyponatremia  130-131.  He was cleared to begin subcutaneous heparin for DVT prophylaxis.  His therapy evaluations completed and due to decreased functional mobility was admitted to inpatient rehab services 05/15/2021.  Patient with slow progressive gains and pain management as directed.  Close monitoring of BKA site noted boggy necrotic area concerning along incision line as well as leukocytosis [id number] with vascular surgery consulted and if you have staples were removed noted underlying abscess with poor healing and increased necrosis.  Due to these ischemic changes patient was discharged to acute care services 05/20/2021 and underwent left AKA per Dr. Carlis Abbott.  Postoperative course anemia 6.7 05/22/2021 with orders to transfuse 2 units packed red blood cells and close monitoring of sodium 129 and leukocytosis improved 9100 as well as hemoglobin 9.0.  He remained on subcutaneous heparin for DVT prophylaxis. Patient transferred to CIR on 05/25/2021 .    Patient currently requires Max/mod with basic self-care skills secondary to muscle weakness, decreased cardiorespiratoy endurance, and decreased sitting balance, decreased standing balance, decreased postural control, and difficulty maintaining precautions.  Prior to hospitalization, patient could complete BADL with independent .  Patient will benefit from skilled intervention to increase independence with basic self-care skills prior to discharge home with care partner.  Anticipate patient will require 24 hour supervision and follow up home health.  OT - End of Session Activity Tolerance: Decreased this session Endurance Deficit: Yes Endurance Deficit Description: Rest breaks within BADL tasks OT Assessment Rehab Potential (ACUTE ONLY): Good OT Patient demonstrates impairments in the following area(s): Balance;Behavior;Cognition;Edema;Pain;Endurance;Nutrition;Motor;Perception;Safety;Sensory;Skin Integrity OT Basic ADL's Functional Problem(s):  Grooming;Dressing;Bathing;Toileting OT  Transfers Functional Problem(s): Toilet;Tub/Shower OT Additional Impairment(s): None OT Plan OT Intensity: Minimum of 1-2 x/day, 45 to 90 minutes OT Frequency: 5 out of 7 days OT Duration/Estimated Length of Stay: 12-14 days OT Treatment/Interventions: Balance/vestibular training;Self Care/advanced ADL retraining;Therapeutic Exercise;Wheelchair propulsion/positioning;Disease mangement/prevention;Cognitive remediation/compensation;DME/adaptive equipment instruction;Pain management;Skin care/wound managment;UE/LE Strength taining/ROM;Community reintegration;Functional electrical stimulation;Patient/family education;Splinting/orthotics;UE/LE Coordination activities;Discharge planning;Functional mobility training;Psychosocial support;Visual/perceptual remediation/compensation;Therapeutic Activities OT Self Feeding Anticipated Outcome(s): n/a OT Basic Self-Care Anticipated Outcome(s): supervision OT Toileting Anticipated Outcome(s): supervision OT Bathroom Transfers Anticipated Outcome(s): supervision OT Recommendation Patient destination: Home Follow Up Recommendations: Home health OT Equipment Recommended: To be determined   OT Evaluation Precautions/Restrictions  Precautions Precautions: Fall Precaution Comments: L AKA Restrictions Weight Bearing Restrictions: Yes LLE Weight Bearing: Non weight bearing Pain Pain Assessment Pain Scale: 0-10 Pain Score: 6 Pain Type: Chronic pain Pain Location: Back Pain Orientation: Right Pain Descriptors / Indicators: Aching Pain Frequency: Constant Pain Intervention(s): Nursing administered medications Home Living/Prior Functioning Home Living Family/patient expects to be discharged to:: Private residence Living Arrangements: Spouse/significant other Available Help at Discharge: Available 24 hours/day Type of Home: House Home Access: Stairs to enter Technical brewer of Steps: 4 Entrance  Stairs-Rails: Right, Left Home Layout: Multi-level Alternate Level Stairs-Number of Steps: Reports his bed/bath in downstairs, Bathroom Shower/Tub: Chiropodist: Programmer, systems: Yes  Lives With: Spouse, Family IADL History 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[employer], retired Manufacturing systems engineer IADL Comments: Enjoys fishing Prior Function Level of Independence: Independent with basic ADLs, Independent with gait, Independent with transfers, Independent with homemaking with ambulation Vision Baseline Vision/History: Wears glasses Wears Glasses: At all times Vision Assessment?: No apparent visual deficits Cognition Overall Cognitive Status: Impaired/Different from baseline Arousal/Alertness: Awake/alert Orientation Level: Place;Situation;Person Person: Oriented Place: Oriented Situation: Oriented Year: 2022 Month: July Day of Week: Correct Memory: Impaired Memory Impairment: Decreased recall of new information;Retrieval deficit Immediate Memory Recall: Sock;Bed;Blue Memory Recall Sock: Not able to recall Memory Recall Blue: Without Cue Memory Recall Bed: Without Cue Attention: Sustained Sustained Attention: Appears intact Problem Solving: Impaired Problem Solving Impairment: Functional basic Safety/Judgment: Impaired (but seems much better than previous rehab stay prior to BKLA converted to AKA) Sensation Coordination Gross Motor Movements are Fluid and Coordinated: No Fine Motor Movements are Fluid and Coordinated: Yes Coordination and Movement Description: new L AKA Finger Nose Finger Test: [facility] Motor  Motor Motor: Abnormal postural alignment and control Balance Static Sitting Balance Static Sitting - Level of Assistance: 5: Stand by assistance Dynamic Sitting Balance Dynamic Sitting - Level of Assistance: 5: Stand by assistance Extremity/Trunk Assessment   LUE Assessment LUE Assessment: Within Functional Limits  Care  Tool Care Tool Self Care Eating   Eating Assist Level: Set up assist    Oral Care    Oral Care Assist Level: Set up assist    Bathing   Body parts bathed by patient: Left arm;Chest;Abdomen;Face;Right arm;Front perineal area;Buttocks;Right upper leg;Left upper leg Body parts bathed by helper: Left upper leg Body parts n/a: Left lower leg Assist Level: Moderate Assistance - Patient 50 - 74%    Upper Body Dressing(including orthotics)   What is the patient wearing?: Pull over shirt   Assist Level: Minimal Assistance - Patient > 75%    Lower Body Dressing (excluding footwear)   What is the patient wearing?: Pants Assist for lower body dressing: Maximal Assistance - Patient 25 - 49%    Putting on/Taking off footwear   What is the patient wearing?: Non-skid slipper socks  Care Tool Toileting Toileting activity Toileting Activity did not occur (Clothing management and hygiene only): N/A (no void or bm)       Care Tool Bed Mobility Roll left and right activity   Roll left and right assist level: Minimal Assistance - Patient > 75%    Sit to lying activity   Sit to lying assist level: Minimal Assistance - Patient > 75%    Lying to sitting edge of bed activity   Lying to sitting edge of bed assist level: Minimal Assistance - Patient > 75%     Care Tool Transfers Sit to stand transfer Sit to stand activity did not occur: Safety/medical concerns Sit to stand assist level: Moderate Assistance - Patient 50 - 74%    Chair/bed transfer   Chair/bed transfer assist level: Moderate Assistance - Patient 50 - 74%     Toilet transfer   Assist Level: Maximal Assistance - Patient 24 - 49%     Care Tool Cognition Expression of Ideas and Wants Expression of Ideas and Wants: Some difficulty - exhibits some difficulty with expressing needs and ideas (e.g, some words or finishing thoughts) or speech is not clear   Understanding Verbal and Non-Verbal Content Understanding Verbal and  Non-Verbal Content: Usually understands - understands most conversations, but misses some part/intent of message. Requires cues at times to understand   Memory/Recall Ability *first 3 days only Memory/Recall Ability *first 3 days only: Current season;Staff names and faces    Refer to Care Plan for Long Term Goals  SHORT TERM GOAL WEEK 1 OT Short Term Goal 1 (Week 1): Pt will complete LB dressing with min A and use of AE, as needed. OT Short Term Goal 2 (Week 1): Pt will complete toileting task with min A. OT Short Term Goal 3 (Week 1): Pt will complete functional transfers (toilet, shower) with min A.  Recommendations for other services: None    Skilled Therapeutic Intervention Pt greeted semi-reclined in bed and agreeable to OT eval and treat. OT eval completed addressing rehab process, OT purpose, POC, ELOS, and goals.  LB bathing completed EOB using lateral leans to wash buttocks and pull up pants. UB bathing/dressing also at EOB with good sitting balance. Squat-pivot over to drop arm wc. Grooming tasks completed from wc at the sink. See below for further details regarding BADL performance. Pt left seated in wc at end of session with alarm belt on, call bell in reach, and needs met.   ADL ADL Eating: Set up Grooming: Setup Upper Body Bathing: Setup Lower Body Bathing: Moderate assistance;Maximal assistance Upper Body Dressing: Minimal assistance Lower Body Dressing: Moderate assistance;Maximal assistance Toileting: Maximal assistance Toilet Transfer: Maximal assistance;Moderate assistance Mobility  Bed Mobility Bed Mobility: Rolling Right;Rolling Left;Supine to Sit;Sit to Supine;Sitting - Scoot to [employer] of Bed Rolling Right: Supervision/verbal cueing Rolling Left: Supervision/Verbal cueing Supine to Sit: Minimal Assistance - Patient > 75% Sitting - Scoot to Edge of Bed: Supervision/Verbal cueing Sit to Supine: Minimal Assistance - Patient > 75%   Discharge Criteria: Patient  will be discharged from OT if patient refuses treatment 3 consecutive times without medical reason, if treatment goals not met, if there is a change in medical status, if patient makes no progress towards goals or if patient is discharged from hospital.  The above assessment, treatment plan, treatment alternatives and goals were discussed and mutually agreed upon: by patient  Valma Cava 05/26/2021, 1:36 PM

## 2021-05-26 NOTE — Progress Notes (Signed)
Inpatient Rehabilitation  Patient information reviewed and entered into eRehab system by Dejan Angert Cicely Ortner, OTR/L.   Information including medical coding, functional ability and quality indicators will be reviewed and updated through discharge.    

## 2021-05-26 NOTE — Evaluation (Signed)
Physical Therapy Assessment and Plan  Patient Details  Name: Todd [facility]an MRN: [id number] Date of Birth: Dec 09, 1945  PT Diagnosis: Difficulty walking, Impaired sensation, and Muscle weakness Rehab Potential: Good ELOS: 10-14 days   Today's Date: 05/26/2021 PT Individual Time: 7416-3845 PT Individual Time Calculation (min): 54 min    [facility] Problem: Active Problems:   Left above-knee am[facility])   Past Medical History:  Past Medical History:  Diagnosis Date   Diabetes mellitus without complication ([place])    Hypertension    Past Surgical History:  Past Surgical History:  Procedure Laterality Date   ABDOMINAL AORTOGRAM W/LOWER EXTREMITY N/A 05/12/2021   Procedure: ABDOMINAL AORTOGRAM W/LOWER EXTREMITY;  Surgeon: Todd Mitchell, MD;  Location: [facility];  Service: Cardiovascular;  Laterality: N/A;   AMPUTATION Left 05/13/2021   Procedure: AMPUTATION BELOW KNEE LEFT ;  Surgeon: Todd Mitchell, MD;  Location: [facility] OR;  Service: Vascular;  Laterality: Left;   AMPUTATION Left 05/20/2021   Procedure: AMPUTATION ABOVE KNEE;  Surgeon: Todd Heck, MD;  Location: [facility] OR;  Service: Vascular;  Laterality: Left;   HERNIA REPAIR     KNEE SURGERY      Assessment & Plan Clinical Impression: Patient is a 75 year old right-handed male with history of hypertension, diabetes mellitus, tobacco and alcohol use.  Per chart review lives with spouse.  Independent prior to admission.  Multilevel home 4 steps to entry.  Spouse works during the day.  Presented 05/13/2021 with progressive left foot pain x2 months with ischemic changes and findings of decreased femoral pulses bilaterally.  Underwent ultrasound-guided left femoral artery with abdominal aortogram showing bilateral renal artery stenosis.  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 Trula [facility].  [facility] course anemia as well as mild  hyponatremia 130-131.  He was cleared to begin subcutaneous heparin for DVT prophylaxis.  His therapy evaluations completed and due to decreased functional mobility was admitted to inpatient rehab services 05/15/2021.  Patient with slow progressive gains and pain management as directed.  Close monitoring of BKA site noted boggy necrotic area concerning along incision line as well as leukocytosis [id number] with vascular surgery consulted and if you have staples were removed noted underlying abscess with poor healing and increased necrosis.  Due to these ischemic changes patient was discharged to acute care services 05/20/2021 and underwent left AKA per Dr. Carlis [facility]an.  Postoperative course anemia 6.7 05/22/2021 with orders to transfuse 2 units packed red blood cells and close monitoring of sodium 129 and leukocytosis improved 9100 as well as hemoglobin 9.0.  He remained on subcutaneous heparin for DVT prophylaxis. Patient transferred to CIR on 05/25/2021 .   Patient currently requires mod with mobility secondary to muscle weakness, decreased cardiorespiratoy endurance, and decreased standing balance, decreased postural control, and decreased balance strategies.  Prior to hospitalization, patient was independent  with mobility and lived with Spouse, Family in a House home.  Home access is 4Stairs to enter.  Patient will benefit from skilled PT intervention to maximize safe functional mobility, minimize fall risk, and decrease caregiver burden for planned discharge home with intermittent assist.  Anticipate patient will benefit from follow up Todd [facility]an at discharge.  PT - End of Session Activity Tolerance: Tolerates 30+ min activity with multiple rests Endurance Deficit: Yes Endurance Deficit Description: Rest breaks within mobility tasks PT Assessment Rehab Potential (ACUTE/IP ONLY): Good PT Barriers to Discharge: [place] home environment;Decreased caregiver support;Home  environment access/layout;Lack of/limited  family support;Weight bearing restrictions;Behavior PT Patient demonstrates impairments in the following area(s): Balance;Endurance;Motor;Pain;Safety;Skin Integrity;Sensory PT Transfers Functional Problem(s): Bed Mobility;Bed to Chair;Car;Furniture PT Locomotion Functional Problem(s): Ambulation;Wheelchair Mobility;Stairs PT Plan PT Intensity: Minimum of 1-2 x/day ,45 to 90 minutes PT Frequency: 5 out of 7 days PT Duration Estimated Length of Stay: 10-14 days PT Treatment/Interventions: Ambulation/gait training;Cognitive remediation/compensation;Discharge planning;DME/adaptive equipment instruction;Functional mobility training;Pain management;Psychosocial support;Splinting/orthotics;Therapeutic Activities;UE/LE Strength taining/ROM;Wheelchair propulsion/positioning;UE/LE Coordination activities;Therapeutic Exercise;Stair training;Skin care/wound management;Patient/family education;Neuromuscular re-education;Functional electrical stimulation;Disease management/prevention;Community reintegration;Balance/vestibular training PT Transfers Anticipated Outcome(s): Supervision PT Locomotion Anticipated Outcome(s): 74' with CGA and LRAD PT Recommendation Follow Up Recommendations: Home health PT Patient destination: Home Equipment Recommended: To be determined   PT Evaluation Precautions/Restrictions Precautions Precautions: Fall Precaution Comments: L AKA Restrictions Weight Bearing Restrictions: Yes LLE Weight Bearing: Non weight bearing Pain Pain Assessment Pain Scale: 0-10 Pain Score: 5  Pain Type: Chronic pain Pain Location: Back Pain Orientation: Right Pain Descriptors / Indicators: Aching Pain Frequency: Constant Pain Intervention(s): Medication (See eMAR) Home Living/Prior Functioning Home Living Available Help at Discharge: Available 24 hours/day Type of Home: House Home Access: Stairs to enter Technical brewer of Steps: 4 Entrance Stairs-Rails: Right;Left Home Layout:  Multi-level Alternate Level Stairs-Number of Steps: Reports his bed/bath in downstairs, Bathroom Shower/Tub: Chiropodist: Standard Bathroom Accessibility: Yes  Lives With: Spouse;Family Prior Function Level of Independence: Independent with basic ADLs;Independent with gait;Independent with transfers;Independent with homemaking with ambulation Vision/Perception  Perception Perception: Within Functional Limits Praxis Praxis: Intact  Cognition Overall Cognitive Status: Impaired/Different from baseline Arousal/Alertness: Awake/alert Orientation Level: Oriented X4 Attention: Sustained Sustained Attention: Appears intact Memory: Impaired Memory Impairment: Decreased recall of new information;Retrieval deficit Immediate Memory Recall: Sock;Bed;Blue Memory Recall Sock: Not able to recall Memory Recall Blue: Without Cue Memory Recall Bed: Without Cue Problem Solving: Impaired Problem Solving Impairment: Functional basic Safety/Judgment: Impaired (but seems much better than previous rehab stay prior to BKLA converted to AKA) Sensation Coordination Gross Motor Movements are Fluid and Coordinated: No Fine Motor Movements are Fluid and Coordinated: Yes Coordination and Movement Description: new L AKA Finger Nose Finger Test: [facility] Motor  Motor Motor: Abnormal postural alignment and control Motor - Skilled Clinical Observations: new L AKA  Trunk/Postural Assessment  Cervical Assessment Cervical Assessment: Within Functional Limits Thoracic Assessment Thoracic Assessment: Within Functional Limits Lumbar Assessment Lumbar Assessment:  (Posterior pelvic tilt) Postural Control Postural Control: Deficits on evaluation (decreased/delayed4)  Balance Static Sitting Balance Static Sitting - Level of Assistance: 5: Stand by assistance Dynamic Sitting Balance Dynamic Sitting - Level of Assistance: 5: Stand by assistance Extremity Assessment  LUE Assessment LUE  Assessment: Within Functional Limits RLE Assessment RLE Assessment: Exceptions to [facility] Active Range of Motion (AROM) Comments: WFL General Strength Comments: Grossly 4/5 LLE Assessment LLE Assessment: Exceptions to [facility] General Strength Comments: Hip ~3/5  Care Tool Care Tool Bed Mobility Roll left and right activity   Roll left and right assist level: Minimal Assistance - Patient > 75%    Sit to lying activity   Sit to lying assist level: Minimal Assistance - Patient > 75%    Lying to sitting edge of bed activity   Lying to sitting edge of bed assist level: Minimal Assistance - Patient > 75%     Care Tool Transfers Sit to stand transfer Sit to stand activity did not occur: Safety/medical concerns Sit to stand assist level: Moderate Assistance - Patient 50 - 74%    Chair/bed transfer   Chair/bed transfer assist level: Moderate  Assistance - Patient 50 - 74%     Toilet transfer   Assist Level: Maximal Assistance - Patient 24 - 49%    Car transfer   Car transfer assist level: Moderate Assistance - Patient 50 - 74%      Care Tool Locomotion Ambulation   Assist level: Minimal Assistance - Patient > 75% Assistive device: Parallel bars Max distance: 5'  Walk 10 feet activity Walk 10 feet activity did not occur: Safety/medical concerns       Walk 50 feet with 2 turns activity Walk 50 feet with 2 turns activity did not occur: Safety/medical concerns      Walk 150 feet activity Walk 150 feet activity did not occur: Safety/medical concerns      Walk 10 feet on uneven surfaces activity Walk 10 feet on uneven surfaces activity did not occur: Safety/medical concerns      Stairs Stair activity did not occur: Safety/medical concerns        Walk up/down 1 step activity Walk up/down 1 step or curb (drop down) activity did not occur: Safety/medical concerns     Walk up/down 4 steps activity did not occuR: Safety/medical concerns  Walk up/down 4 steps activity      Walk  up/down 12 steps activity Walk up/down 12 steps activity did not occur: Safety/medical concerns      Pick up small objects from floor Pick up small object from the floor (from standing position) activity did not occur: Safety/medical concerns      Wheelchair Will patient use wheelchair at discharge?: Yes Type of Wheelchair: Manual   Wheelchair assist level: Supervision/Verbal cueing Max wheelchair distance: 150'  Wheel 50 feet with 2 turns activity   Assist Level: Supervision/Verbal cueing  Wheel 150 feet activity   Assist Level: Supervision/Verbal cueing    Refer to Care Plan for Long Term Goals  SHORT TERM GOAL WEEK 1 PT Short Term Goal 1 (Week 1): Patient with perform basic transfers with min A using LRAD. PT Short Term Goal 2 (Week 1): Pt will ambulate x25' with minA and LRAD. PT Short Term Goal 3 (Week 1): Patient with perform standing activities with mod A >5 min.  Recommendations for other services: None   Skilled Therapeutic Intervention  Evaluation completed (see details above and below) with education on PT POC and goals and individual treatment initiated with focus on bed mobility, balance, transfers, and initiating gait training. Pt received seated in WC and agrees to therapy. Reports pain in L lower back, chronic in nature. PT provides repositioning as needed and alerts RN to pt's pain complaints. Pt self propels WC x150' with supervision on safety and efficient propulsion technique. Pt also manages WC on and off elevator. PT completes sit to stand with modA and RW, with cues on body mechanics and hand placement. Pt performs car transfer with modA and multimodal cueing for sequencing, hand placement, and safety. Following seated rest break, pt performs sit to stand in parallel bars. Pt ambulates forward and backward 5' total with minA and cues for upright gaze to improve posture and balance, and ensuring COG over pt's BOS. Pt then performs 2x5 heel raises facing mirror for  visual feedback. Able to just barely clear R heel from floor with exercise. Pt propels WC back to room. Squat pivot back to bed with modA. Sit to supine with minA. Left with alarm intact and all needs within reach.  Mobility Bed Mobility Bed Mobility: Rolling Right;Rolling Left;Supine to Sit;Sit to Supine;Sitting - Scoot  to Edge of Bed Rolling Right: Supervision/verbal cueing Rolling Left: Supervision/Verbal cueing Supine to Sit: Minimal Assistance - Patient > 75% Sitting - Scoot to Edge of Bed: Supervision/Verbal cueing Sit to Supine: Minimal Assistance - Patient > 75% Locomotion  Gait Ambulation: Yes Gait Assistance: Minimal Assistance - Patient > 75% Gait Distance (Feet): 5 Feet Assistive device: Parallel bars Gait Assistance Details: Tactile cues for initiation;Tactile cues for posture;Verbal cues for gait pattern;Verbal cues for sequencing;Verbal cues for technique Gait Gait: Yes Gait Pattern: Decreased step length - right;Right flexed knee in stance Gait velocity: Decreased Stairs / Additional Locomotion Stairs: No Wheelchair Mobility Wheelchair Mobility: Yes Wheelchair Assistance: Chartered loss adjuster: Both upper extremities Wheelchair Parts Management: Needs assistance Distance: 150'   Discharge Criteria: Patient will be discharged from PT if patient refuses treatment 3 consecutive times without medical reason, if treatment goals not met, if there is a change in medical status, if patient makes no progress towards goals or if patient is discharged from [facility].  The above assessment, treatment plan, treatment alternatives and goals were discussed and mutually agreed upon: by patient  Breck Coons, PT, DPT 05/26/2021, 12:48 PM

## 2021-05-26 NOTE — Progress Notes (Addendum)
PROGRESS NOTE   Subjective/Complaints: Pt had a good  night. Denies pain (except for a little in back). Able to sleep. Ready to get started with therapies. Hasn't had a bm for a week +?  ROS: Patient denies fever, rash, sore throat, blurred vision, nausea, vomiting, diarrhea, cough, shortness of breath or chest pain, joint or back pain, headache, or mood change.    Objective:   No results found. Recent Labs    05/25/21 1530 05/26/21 0443  WBC 7.1 7.0  HGB 8.8* 8.4*  HCT 28.0* 26.5*  PLT 561* 513*   Recent Labs    05/25/21 1530 05/26/21 0443  NA  --  133*  K  --  4.0  CL  --  102  CO2  --  25  GLUCOSE  --  113*  BUN  --  23  CREATININE 0.86 0.77  CALCIUM  --  8.8*    Intake/Output Summary (Last 24 hours) at 05/26/2021 1200 Last data filed at 05/26/2021 0900 Gross per 24 hour  Intake 240 ml  Output 900 ml  Net -660 ml        Physical Exam: Vital Signs Blood pressure 131/77, pulse 84, temperature 98.3 F (36.8 C), temperature source Oral, resp. rate 18, height 5\' 11"  (1.803 m), weight 53.9 kg, SpO2 98 %.  General: Alert and oriented x 3, No apparent distress. Frail appearing HEENT: Head is normocephalic, atraumatic, PERRLA, EOMI, sclera anicteric, oral mucosa pink and moist, dentition intact, ext ear canals clear,  Neck: Supple without JVD or lymphadenopathy Heart: Reg rate and rhythm. No murmurs rubs or gallops Chest: CTA bilaterally without wheezes, rales, or rhonchi; no distress Abdomen: Soft, non-tender, non-distended, bowel sounds positive. Extremities: No clubbing, cyanosis, or edema. Pulses are 2+ Psych: Pt's affect is appropriate. Pt is cooperative Skin: left AKA stump intact, well-approximated. S/S drainage.  Neuro: Pt is cognitively appropriate with normal insight, memory, and awareness. Cranial nerves 2-12 are intact. Sensory exam is normal. Reflexes are 2+ in all 4's. Fine motor coordination is  intact. No tremors. Motor function is grossly 5/5 UE. LLE 3/5. RLE 4-5/5.   Musculoskeletal: Full ROM, No pain with AROM or PROM in the neck, trunk, or extremities. Posture appropriate    Assessment/Plan: 1. Functional deficits which require 3+ hours per day of interdisciplinary therapy in a comprehensive inpatient rehab setting. Physiatrist is providing close team supervision and 24 hour management of active medical problems listed below. Physiatrist and rehab team continue to assess barriers to discharge/monitor patient progress toward functional and medical goals  Care Tool:  Bathing    Body parts bathed by patient: Left arm, Chest, Abdomen, Face, Right arm, Front perineal area, Buttocks, Right upper leg, Left upper leg   Body parts bathed by helper: Left upper leg Body parts n/a: Left lower leg   Bathing assist Assist Level: Moderate Assistance - Patient 50 - 74%     Upper Body Dressing/Undressing Upper body dressing   What is the patient wearing?: Pull over shirt    Upper body assist Assist Level: Minimal Assistance - Patient > 75%    Lower Body Dressing/Undressing Lower body dressing      What is  the patient wearing?: Pants     Lower body assist Assist for lower body dressing: Maximal Assistance - Patient 25 - 49%     Toileting Toileting Toileting Activity did not occur (Clothing management and hygiene only): N/A (no void or bm)  Toileting assist Assist for toileting: Minimal Assistance - Patient > 75%     Transfers Chair/bed transfer  Transfers assist     Chair/bed transfer assist level: Maximal Assistance - Patient 25 - 49%     Locomotion Ambulation   Ambulation assist              Walk 10 feet activity   Assist           Walk 50 feet activity   Assist           Walk 150 feet activity   Assist           Walk 10 feet on uneven surface  activity   Assist           Wheelchair     Assist                Wheelchair 50 feet with 2 turns activity    Assist            Wheelchair 150 feet activity     Assist          Blood pressure 131/77, pulse 84, temperature 98.3 F (36.8 C), temperature source Oral, resp. rate 18, height 5\' 11"  (1.803 m), weight 53.9 kg, SpO2 98 %.   Medical Problem List and Plan: 1.  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  Antithrombotics: -DVT/anticoagulation: Subcutaneous heparin             -antiplatelet therapy: Aspirin 81 mg daily 3. Pain Management: Oxycodone as needed             -massage and elevation to control pain/edema as well.  -oob to chair for LB pain.  4. Mood: Provide emotional support             -antipsychotic agents: N/A 5. Neuropsych: This patient is capable of making decisions on his own behalf. 6. Skin/Wound Care: Routine skin checks             -dry dressing and ACE wrap to stump daily---transition to shrinker once drainage subsides a bit 7. Fluids/Electrolytes/Nutrition: encourage PO  -I personally reviewed the patient's labs today.  BUN up--->push fluids  -AP elevated but likely reactive.  8.  Acute blood loss anemia.  Transfused 2 units 05/22/2021.  Follow-up hgb 8.4 today   -recheck Thursday 9.  Hypertension.  Norvasc 10 mg daily, Tenormin 25 mg twice daily.     -normotensive at present 10.  Hyperlipidemia.  Lipitor 11.  Diabetes mellitus.  Hemoglobin A1c 5.2.  SSI.  Patient on Glucophage 500 mg daily prior to admission.   -CBG (last 3)  Recent Labs    05/25/21 2106 05/26/21 0605 05/26/21 1201  GLUCAP 193* 119* 119*   -will hold on meds for now.7/19 12.  History of tobacco alcohol use.  Provide counseling 13.  Hyponatremia.  mild 133, recheck thursday 14. SLow transit constipation: sorbitol and SSE if needed  today     LOS: 1 days A FACE TO FACE EVALUATION WAS PERFORMED  Ranelle Oyster 05/26/2021, 12:00 PM

## 2021-05-26 NOTE — Progress Notes (Signed)
Occupational Therapy Session Note  Patient Details  Name: Todd Duran MRN: [id number] Date of Birth: 10/04/1946  Today's Date: 05/26/2021 OT Individual Time: 8241-7530 OT Individual Time Calculation (min): 46 min  and Today's Date: 05/26/2021 OT Missed Time: 14 Minutes Missed Time Reason: Patient fatigue;Pain  Short Term Goals: Week 1:  OT Short Term Goal 1 (Week 1): Pt will complete LB dressing with min A and use of AE, as needed. OT Short Term Goal 2 (Week 1): Pt will complete toileting task with min A. OT Short Term Goal 3 (Week 1): Pt will complete functional transfers (toilet, shower) with min A.  Skilled Therapeutic Interventions/Progress Updates:    Pt greeted semi-reclined in bed and agreeable to OT treatment session. Pt completed bed mobility with supervision and HOB elevated. Nursing came in and administered pain medication and bowel meds. Pt reported he wanted to try to get to [facility]. Worked on lateral scooting and squat-pivot transfers to drop-arm BSC- Mod A. Lateral leans to pull down pants with OT assist. Pt spent extended time on BSC trying to have BM. Pt voided bladder, but was unable to have a BM. Worked on lateral leans again for peri-care which pt was able to lean far enough on wide commode to reach bottom. Pt completed similar transfer back to bed while pants still down. Lateral leans to pull up pants with OT assist. Pt took rest break, then OT tried to get pt to attempt standing again. Pt stated he was too fatigued and his residual limb was hurting. Pt left semi-reclined in bed with bed alarm on, family present, call bell in reach, and needs met.   Therapy Documentation Precautions:  Precautions Precautions: Fall Precaution Comments: L AKA Restrictions Weight Bearing Restrictions: Yes LLE Weight Bearing: Non weight bearing General: General OT Amount of Missed Time: 14 Minutes Pain: Pain Assessment Pain Scale: 0-10 Pain Score: 5  Pain Type: Chronic pain Pain  Location: Back Pain Orientation: Right Pain Descriptors / Indicators: Aching Pain Frequency: Constant Pain Intervention(s): Nursing administered pain meds   Therapy/Group: Individual Therapy  Valma Cava 05/26/2021, 1:54 PM

## 2021-05-26 NOTE — Plan of Care (Signed)
  Problem: RH Balance Goal: LTG: Patient will maintain dynamic sitting balance (OT) Description: LTG:  Patient will maintain dynamic sitting balance with assistance during activities of daily living (OT) Flowsheets (Taken 05/26/2021 0945) LTG: Pt will maintain dynamic sitting balance during ADLs with: Supervision/Verbal cueing Goal: LTG Patient will maintain dynamic standing with ADLs (OT) Description: LTG:  Patient will maintain dynamic standing balance with assist during activities of daily living (OT)  Flowsheets (Taken 05/26/2021 0945) LTG: Pt will maintain dynamic standing balance during ADLs with: Contact Guard/Touching assist   Problem: Sit to Stand Goal: LTG:  Patient will perform sit to stand in prep for activites of daily living with assistance level (OT) Description: LTG:  Patient will perform sit to stand in prep for activites of daily living with assistance level (OT) Flowsheets (Taken 05/26/2021 0945) LTG: PT will perform sit to stand in prep for activites of daily living with assistance level: Contact Guard/Touching assist   Problem: RH Grooming Goal: LTG Patient will perform grooming w/assist,cues/equip (OT) Description: LTG: Patient will perform grooming with assist, with/without cues using equipment (OT) Flowsheets (Taken 05/26/2021 0945) LTG: Pt will perform grooming with assistance level of: Independent with assistive device    Problem: RH Bathing Goal: LTG Patient will bathe all body parts with assist levels (OT) Description: LTG: Patient will bathe all body parts with assist levels (OT) Flowsheets (Taken 05/26/2021 0945) LTG: Pt will perform bathing with assistance level/cueing: Supervision/Verbal cueing   Problem: RH Dressing Goal: LTG Patient will perform upper body dressing (OT) Description: LTG Patient will perform upper body dressing with assist, with/without cues (OT). Flowsheets (Taken 05/26/2021 0945) LTG: Pt will perform upper body dressing with assistance  level of: Set up assist Goal: LTG Patient will perform lower body dressing w/assist (OT) Description: LTG: Patient will perform lower body dressing with assist, with/without cues in positioning using equipment (OT) Flowsheets (Taken 05/26/2021 0945) LTG: Pt will perform lower body dressing with assistance level of: Supervision/Verbal cueing   Problem: RH Toileting Goal: LTG Patient will perform toileting task (3/3 steps) with assistance level (OT) Description: LTG: Patient will perform toileting task (3/3 steps) with assistance level (OT)  Flowsheets (Taken 05/26/2021 0945) LTG: Pt will perform toileting task (3/3 steps) with assistance level: Supervision/Verbal cueing   Problem: RH Toilet Transfers Goal: LTG Patient will perform toilet transfers w/assist (OT) Description: LTG: Patient will perform toilet transfers with assist, with/without cues using equipment (OT) Flowsheets (Taken 05/26/2021 0945) LTG: Pt will perform toilet transfers with assistance level of: Supervision/Verbal cueing   Problem: RH Tub/Shower Transfers Goal: LTG Patient will perform tub/shower transfers w/assist (OT) Description: LTG: Patient will perform tub/shower transfers with assist, with/without cues using equipment (OT) Flowsheets (Taken 05/26/2021 0945) LTG: Pt will perform tub/shower stall transfers with assistance level of: Supervision/Verbal cueing

## 2021-05-26 NOTE — Patient Care Conference (Signed)
Inpatient RehabilitationTeam Conference and Plan of Care Update Date: 05/26/2021   Time: 10:46 AM    Patient Name: Todd Duran      Medical Record Number: [id number]  Date of Birth: 1946/06/14 Sex: Male         Room/Bed: 5C09C/5C09C-01 Payor Info: Payor: [employer] / Plan: [facility] / Product Type: *No Product type* /    Admit Date/Time:  05/25/2021  3:05 PM  Primary Diagnosis:  <principal problem not specified>  Hospital Problems: Active Problems:   Left above-knee amputee [facility])    Expected Discharge Date: Expected Discharge Date:  (10-14 days)  Team Members Present: Physician leading conference: Dr. Faith Rogue Care Coodinator Present: Cecile Sheerer, LCSWA;Deklan Minar Marlyne Beards, RN, BSN, CRRN Nurse Present: Kennyth Arnold, RN PT Present: Malachi Pro, PT OT Present: Kearney Hard, OT PPS Coordinator present : Fae Pippin, SLP     Current Status/Progress Goal Weekly Team Focus  Bowel/Bladder             Swallow/Nutrition/ Hydration             ADL's   Mod A transfers, Mod/max A LB ADLs  supervision overall  Transfers, self-care retraining, limb loss education,   Mobility             Communication             Safety/Cognition/ Behavioral Observations            Pain             Skin               Discharge Planning:      Team Discussion: Came back to CIR yesterday, looks much better. Wound is draining a little. Continent B/B, LBM 05/17/21. 8/10 pain, Percocet, 1-2 tabs, q 4 hr. Right BKA with staples. Patient on target to meet rehab goals: Transfers mod assist. PT eval pending.  *See Care Plan and progress notes for long and short-term goals.   Revisions to Treatment Plan:  Medications for constipation.  Teaching Needs: Family education, medication management, pain management, residual limb education, skin/wound care, constipation management, transfer training, gait training, balance training, endurance training,  safety awareness.  Current Barriers to Discharge: Decreased caregiver support, Medical stability, Home enviroment access/layout, Wound care, Lack of/limited family support, Weight bearing restrictions, Medication compliance, and Behavior  Possible Resolutions to Barriers: Continue current medications, provide emotional support.     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 pain improved. constipated. monitoring sugars  Barriers to Discharge: Medical stability   Possible Resolutions to Barriers/Weekly Focus: wound care, pain mgt, daily assessment of labs, pt data   Continued Need for Acute Rehabilitation Level of Care: The patient requires daily medical management by a physician with specialized training in physical medicine and rehabilitation for the following reasons: Direction of a multidisciplinary physical rehabilitation program to maximize functional independence : Yes Medical management of patient stability for increased activity during participation in an intensive rehabilitation regime.: Yes Analysis of laboratory values and/or radiology reports with any subsequent need for medication adjustment and/or medical intervention. : Yes   I attest that I was present, lead the team conference, and concur with the assessment and plan of the team.   Tennis Must 05/26/2021, 5:05 PM

## 2021-05-27 LAB — SURGICAL PATHOLOGY

## 2021-05-27 LAB — GLUCOSE, CAPILLARY
Glucose-Capillary: 123 mg/dL — ABNORMAL HIGH (ref 70–99)
Glucose-Capillary: 122 mg/dL — ABNORMAL HIGH (ref 70–99)
Glucose-Capillary: 111 mg/dL — ABNORMAL HIGH (ref 70–99)
Glucose-Capillary: 124 mg/dL — ABNORMAL HIGH (ref 70–99)

## 2021-05-27 NOTE — Progress Notes (Addendum)
Physical Therapy Session Note  Patient Details  Name: Todd Duran MRN: [id number] Date of Birth: 08-03-1946  Today's Date: 05/27/2021 PT Individual Time: 1018-1102 PT Individual Time Calculation (min): 44 min   Short Term Goals: Week 1:  PT Short Term Goal 1 (Week 1): Patient with perform basic transfers with min A using LRAD. PT Short Term Goal 2 (Week 1): Pt will ambulate x25' with minA and LRAD. PT Short Term Goal 3 (Week 1): Patient with perform standing activities with mod A >5 min.  Skilled Therapeutic Interventions/Progress Updates:   PAIN LOW BACK PAIN 10/10 per pt, discussed heating pad, will request per md. Pt supine to sit w/supervisi8on and use of bed features. Lateral scoot bed to [facility] w/set up, cues for safety, cga.  Pt on bariatric BSC which proves to be poor fit as pt slides into oversized hole during BM.  Pt shifts on BSC profusely during BM and requires supervision for safety.  One episode of balance loss to L w/cga for prevention of fall off of commode.   Pt continent of bowels and performs hygiene w/supervision for balance, set up for cloths.  Toileting required extensive time, pt slow w/all aspects. BSC to bed lateral scoot w/cga to min assist w/uphill bias, cues for safety,  Pt stated increased back pain and stated he had to "stretch out" Sit to supine w/cues for safety. Supine therex: L hip abd/add w/cues for technique  Glut sets in bridge positi8on x  10 w/cues for technique Sidelyin hip extension x 10 w/cues for technique/to complete full AROM Clamshells w/cues for LLE x 10 Pt declined oob activity due to back pain.  Discussed need for properly sized BSC and need for full supervision due to balance/safety w/toileting for pt w/NT.  Informed by moving rooms this am to 4th floor, will followup w/primary.  Therapy Documentation Precautions:  Precautions Precautions: Fall Precaution Comments: L AKA Restrictions Weight Bearing Restrictions: Yes LLE Weight  Bearing: Non weight bearing     Therapy/Group: Individual Therapy Rada Hay, PT   Shearon Balo 05/27/2021, 9:23 AM

## 2021-05-27 NOTE — Progress Notes (Signed)
Physical Therapy Session Note  Patient Details  Name: Todd Duran MRN: [id number] Date of Birth: 1946/01/30  Today's Date: 05/27/2021 PT Individual Time: 0800-0910 PT Individual Time Calculation (min): 70 min   Short Term Goals: Week 1:  PT Short Term Goal 1 (Week 1): Patient with perform basic transfers with min A using LRAD. PT Short Term Goal 2 (Week 1): Pt will ambulate x25' with minA and LRAD. PT Short Term Goal 3 (Week 1): Patient with perform standing activities with mod A >5 min.  Skilled Therapeutic Interventions/Progress Updates:     Patient in bed incontinent of bowl upon PT arrival. Patient alert and agreeable to PT session. Patient denied pain during session. Nursing reports he was pre-medicated prior to session.  Therapeutic Activity: Bed Mobility: Patient performed rolling R/L with supervision and min cues for technique to doff/don soiled incontinence brief and to don a clean incontinence brief after toileting on BSC. He performed supine to/from sit with supervision with use of bed rail in a flat bed. Provided verbal cues for rolling to his R side and pushing up with his R elbow to come to sitting. Transfers: Patient performed lateral scoot transfer bed<>BSC with CGA. Provided cues for hand placement and head-hips relationship for proper technique and decreased assist with transfers.   Neuromuscular Re-ed: Patient performed the following sitting balance activities: -performed sitting unsupported on BSC >20 min, cued patient in lateral leans for removal of incontinence brief and peri-care, cued patient to reach outside of BOS to place dirty wash cloths and retrieve clean wash cloths from towel placed on a chair in front of him, focused on sitting balance and core strengthening during a function task with supervision and mod A x1 due to L LOB during reaching  Therapeutic Exercise: Patient performed the following exercises with verbal and tactile cues for proper  technique. -L SLR x8 with 5-10 sec hold during dressing change -L hip abduction/adduction in supine 2x10 -R bridging with L hip flexion 2x5 -supine hip flexor stretch x5 min with gentle manual pressure at residual limb -patient performed supine>long sit x3 for abdominal activation with a functional movement and task of applying lotion to R lower extremity for skin integrity and to promote prolonged abdominal hold  Patient without dressing on residual limb at beginning of session. No drainage and well healing incision on inspection. Applied non-adherent dressing, Kerlix, and 1-4" ACE wrap, did not apply hips wrap due to frequency of bowl incontinence. Educated patient on wrapping purpose and technique throughout.   Patient in bed at end of session with breaks locked, bed alarm set, and all needs within reach.   Therapy Documentation Precautions:  Precautions Precautions: Fall Precaution Comments: L AKA Restrictions Weight Bearing Restrictions: Yes LLE Weight Bearing: Non weight bearing    Therapy/Group: Individual Therapy  Caster Fayette L Caterin Tabares PT, DPT  05/27/2021, 9:15 AM

## 2021-05-27 NOTE — Progress Notes (Signed)
Occupational Therapy Session Note  Patient Details  Name: Todd Duran MRN: [id number] Date of Birth: Jan 29, 1946  Today's Date: 05/27/2021 OT Individual Time: 1445-1538 OT Individual Time Calculation (min): 53 min    Short Term Goals: Week 1:  OT Short Term Goal 1 (Week 1): Pt will complete LB dressing with min A and use of AE, as needed. OT Short Term Goal 2 (Week 1): Pt will complete toileting task with min A. OT Short Term Goal 3 (Week 1): Pt will complete functional transfers (toilet, shower) with min A.  Skilled Therapeutic Interventions/Progress Updates:     Pt received in bed with urated pain in residual limb. RN delivers pain medication and rest provided for pain relief  ADL:  Pt completes UB dressing with set up to don shirt. Pt completes LB dressing with MIN A for sitting balance. Pt  when threading BLE into pants and lateral leaning to advance pants past hips. Pt with poor trunk control at EOB often laying back down fully on side when lateral leaning to elbow. Attempted to engage pt on seated EOB modified sit ups, however pt perseverative on "it will make me poop" Pt completes toileting with MOD A for lateral leans to advance pant past hips and supervision for peri bathing after smear of a BM in brief/commode. Pt completes toileting transfer with CGA overall for down hill squat pivot transfer and MIN A for uphill back to bed.  OT rewraps residual limb with ace wrap.   Therapeutic activity Pt educated on skin inspection and limb desensitization methods. Pt able to demo ability to gently rub residual limb as well tap. Pt provided with handout for skin inspection education to refer to later.   Pt left at end of session in bed with exit alarm on, call light in reach and all needs met   Therapy Documentation Precautions:  Precautions Precautions: Fall Precaution Comments: L AKA Restrictions Weight Bearing Restrictions: Yes LLE Weight Bearing: Non weight  bearing General:   Vital Signs: Therapy Vitals Temp: 99.6 F (37.6 C) Pulse Rate: 83 Resp: 16 BP: 128/69 Patient Position (if appropriate): Lying Oxygen Therapy SpO2: 100 % O2 Device: Room Air Pain:   ADL: ADL Eating: Set up Grooming: Setup Upper Body Bathing: Setup Lower Body Bathing: Moderate assistance, Maximal assistance Upper Body Dressing: Minimal assistance Lower Body Dressing: Moderate assistance, Maximal assistance Toileting: Maximal assistance Toilet Transfer: Maximal assistance, Moderate assistance Vision   Perception    Praxis   Exercises:   Other Treatments:     Therapy/Group: Individual Therapy  Tonny Branch 05/27/2021, 6:53 AM

## 2021-05-27 NOTE — Progress Notes (Signed)
Patient ID: Todd Duran, male   DOB: 1946-02-02, 75 y.o.   MRN: [id number]  SW received return phone call from [facility] SW (p:[phone]/[phone]) to discuss pt discharge. SW informed once aware of d/c date will provide. Pt is above 70% service connected, eligible for SNF or long term care, and in home aide. SW requested aide for pt. States she will follow-up with [place] office to see how to coordinate patient care needs.  Requests H&P for pt most recent admission. SW faxed H&P.   Cecile Sheerer, MSW, LCSWA Office: [phone] Cell: [phone] Fax: [phone]

## 2021-05-28 LAB — BASIC METABOLIC PANEL
Anion gap: 9 (ref 5–15)
BUN: 20 mg/dL (ref 8–23)
CO2: 24 mmol/L (ref 22–32)
Calcium: 9.1 mg/dL (ref 8.9–10.3)
Chloride: 100 mmol/L (ref 98–111)
Creatinine, Ser: 0.81 mg/dL (ref 0.61–1.24)
GFR, Estimated: >60 mL/min (ref >60)
Glucose, Bld: 185 mg/dL — ABNORMAL HIGH (ref 70–99)
Potassium: 3.9 mmol/L (ref 3.5–5.1)
Sodium: 133 mmol/L — ABNORMAL LOW (ref 135–145)

## 2021-05-28 LAB — GLUCOSE, CAPILLARY
Glucose-Capillary: 117 mg/dL — ABNORMAL HIGH (ref 70–99)
Glucose-Capillary: 122 mg/dL — ABNORMAL HIGH (ref 70–99)
Glucose-Capillary: 143 mg/dL — ABNORMAL HIGH (ref 70–99)
Glucose-Capillary: 176 mg/dL — ABNORMAL HIGH (ref 70–99)

## 2021-05-28 NOTE — Progress Notes (Signed)
Physical Therapy Session Note  Patient Details  Name: Todd Duran MRN: [id number] Date of Birth: [birth year]  Today's Date: 05/28/2021 PT Individual Time: [phone] and [id number] PT Individual Time Calculation (min): 54 min and 53 min  Short Term Goals: Week 1:  PT Short Term Goal 1 (Week 1): Patient with perform basic transfers with min A using LRAD. PT Short Term Goal 2 (Week 1): Pt will ambulate x25' with minA and LRAD. PT Short Term Goal 3 (Week 1): Patient with perform standing activities with mod A >5 min.  Skilled Therapeutic Interventions/Progress Updates:     1st Session: Pt received supine in bed and agrees to therapy. Reports pain in low back. Chronic in nature and number not provided. PT provides rest breaks and repositioning to manage pain. Supine to sit with cues on logrolling to decrease strain on low back. Squat pivot to WC with minA. WC transport to gym for time management. Pt performs squat pivot to Nustep with minA. Pt completes Nustesp 2x5:00 at workload of 3 with average steps per minute >40. Performed for strength and endurance training and PT provides cues on posture, correct foot placement, and taking rest breaks as needed. Squat pivot back to WC with minA. Pt then attempts sit to stand with RW. Pt cannot achieve initially and not performing adequate anterior weight shift. PT educates on body mechanics and provides increased tactile cueing and pt eventually able to complete with minA. WC transport back to room. Squat pivot back to bed with minA. Left supine with alarm intact and all needs within reach.  2nd Session: Pt received supine in bed and agrees to therapy. No complaint of pain. Supine to sit with supervision and use of bed features. Pt attempts to don sock at EOB but loses balance backward while attempting, requiring modA to bring back to midline. PT assists with donning socks and pt admits that he is anxious about falling forward, making it difficult to put socks  on. Squat pivot to WC with CGA. Pt self propels WC independently to gym, including navigating elevator. Squat pivot to mat table with CGA and cues on body mechanics. Platform placed between pt's legs to provide physical barrier to prevent "falling forward" and also to allow pt to straddle platform to encourage anterior pelvic tilt and improved posture. Pt cued to reach forward with alternating hands to grab cups on ground, then return to upright seated position. Pt then performs task reaching anterior and superior to hang clothespins on basketball net, with PT providing multimodal cues for posture and body mechanics. Pt performs posterior scoot back onto mat table with cues on trunk lean and hand placement. Pt then performs multiple reps of sit to stand with RW and mirror for visual feedback. Mat table raised several inches and PT provides minA at trunk to facilitate weight shift and stability. Pt stands ~30 seconds each rep, prior to fatiguing and asking to sit. Pt then performs stand pivot transfer to [facility] with RW and minA. WC transport back to room. Squat pivot to bed with CGA. Pt left supine with alarm intact and all needs within reach.   Therapy Documentation Precautions:  Precautions Precautions: Fall Precaution Comments: L AKA Restrictions Weight Bearing Restrictions: Yes LLE Weight Bearing: Non weight bearing   Therapy/Group: Individual Therapy  Beau Fanny, PT, DPT 05/28/2021, 3:52 PM

## 2021-05-28 NOTE — IPOC Note (Signed)
Overall Plan of Care [facility]) Patient Details Name: KWADWO TARAS MRN: [id number] DOB: Mar 24, 1946  Admitting Diagnosis: Left above-knee amputee [facility])  Hospital Problems: Principal Problem:   Left above-knee amputee [facility])     Functional Problem List: Nursing Behavior, Edema, Endurance, Medication Management, Pain, Safety, Skin Integrity  PT Balance, Endurance, Motor, Pain, Safety, Skin Integrity, Sensory  OT Balance, Behavior, Cognition, Edema, Pain, Endurance, Nutrition, Motor, Perception, Safety, Sensory, Skin Integrity  SLP    TR         Basic ADL's: OT Grooming, Dressing, Bathing, Toileting     Advanced  ADL's: OT       Transfers: PT Bed Mobility, Bed to Chair, Car, Occupational psychologist, Research scientist (life sciences): PT Ambulation, Psychologist, prison and probation services, Stairs     Additional Impairments: OT None  SLP        TR      Anticipated Outcomes Item Anticipated Outcome  Self Feeding n/a  Swallowing      Basic self-care  supervision  Toileting  supervision   Bathroom Transfers supervision  Bowel/Bladder  Supervision with bowels  Transfers  Supervision  Locomotion  25' with CGA and LRAD  Communication     Cognition     Pain  < 4  Safety/Judgment  Supervision and no falls   Therapy Plan: PT Intensity: Minimum of 1-2 x/day ,45 to 90 minutes PT Frequency: 5 out of 7 days PT Duration Estimated Length of Stay: 10-14 days OT Intensity: Minimum of 1-2 x/day, 45 to 90 minutes OT Frequency: 5 out of 7 days OT Duration/Estimated Length of Stay: 12-14 days     Due to the current state of emergency, patients may not be receiving their 3-hours of Medicare-mandated therapy.   Team Interventions: Nursing Interventions Patient/Family Education, Bowel Management, Disease Management/Prevention, Pain Management, Medication Management, Skin Care/Wound Management, Discharge Planning, Psychosocial Support  PT interventions Ambulation/gait training, Cognitive  remediation/compensation, Discharge planning, DME/adaptive equipment instruction, Functional mobility training, Pain management, Psychosocial support, Splinting/orthotics, Therapeutic Activities, UE/LE Strength taining/ROM, Wheelchair propulsion/positioning, UE/LE Coordination activities, Therapeutic Exercise, Stair training, Skin care/wound management, Patient/family education, Neuromuscular re-education, Functional electrical stimulation, Disease management/prevention, Firefighter, Warden/ranger  OT Interventions Warden/ranger, Self Care/advanced ADL retraining, Therapeutic Exercise, Wheelchair propulsion/positioning, Disease mangement/prevention, Cognitive remediation/compensation, DME/adaptive equipment instruction, Pain management, Skin care/wound managment, UE/LE Strength taining/ROM, Community reintegration, Development worker, international aid stimulation, Patient/family education, Splinting/orthotics, UE/LE Coordination activities, Discharge planning, Functional mobility training, Psychosocial support, Visual/perceptual remediation/compensation, Therapeutic Activities  SLP Interventions    TR Interventions    SW/CM Interventions     Barriers to Discharge MD  Medical stability  Nursing Decreased caregiver support, Home environment access/layout, Wound Care, Lack of/limited family support, Weight bearing restrictions, Medication compliance, Behavior Patient lives with spouse in a multi-level home. 4 steps to enter with right and left rails. 4 steps to the bedroom. Wife taking FMLA and will be available 24/7.  PT Inaccessible home environment, Decreased caregiver support, Home environment access/layout, Lack of/limited family support, Weight bearing restrictions, Behavior    OT      SLP      SW       Team Discharge Planning: Destination: PT-Home ,OT- Home , SLP-  Projected Follow-up: PT-Home health PT, OT-  Home health OT, SLP-  Projected Equipment Needs: PT-To be  determined, OT- To be determined, SLP-  Equipment Details: PT- , OT-  Patient/family involved in discharge planning: PT- Patient,  OT-Patient, SLP-   MD ELOS: 10-14 days Medical Rehab Prognosis:  Excellent  Assessment: The patient has been admitted for CIR therapies with the diagnosis of left 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 The team will be addressing functional mobility, strength, stamina, balance, safety, adaptive techniques and equipment, self-care, bowel and bladder mgt, patient and caregiver education, pre-prosthetic ed, pain mgt, stump care, community reentry. Goals have been set at supervision for self-care and mobility.   Due to the current state of emergency, patients may not be receiving their 3 hours per day of Medicare-mandated therapy.    Ranelle Oyster, MD, FAAPMR     See Team Conference Notes for weekly updates to the plan of care

## 2021-05-28 NOTE — Progress Notes (Signed)
Occupational Therapy Session Note  Patient Details  Name: Todd Duran MRN: [id number] Date of Birth: 03/04/1946  Today's Date: 05/28/2021 Session 1 OT Individual Time: 2481-8590 OT Individual Time Calculation (min): 54 min   Session 2 OT Individual Time: 9311-2162 OT Individual Time Calculation (min): 41 min    Short Term Goals: Week 1:  OT Short Term Goal 1 (Week 1): Pt will complete LB dressing with min A and use of AE, as needed. OT Short Term Goal 2 (Week 1): Pt will complete toileting task with min A. OT Short Term Goal 3 (Week 1): Pt will complete functional transfers (toilet, shower) with min A.  Skilled Therapeutic Interventions/Progress Updates:  Session 1:  Met pt lying in bed, safety belt on, pt agreed to session. Treatment session was focused on self-care retraining and functional transfers. Pt was CGA for bed mobility > EOB. Pt requested to use BSC for BM. Pt able to laterally scoot for easier transfer to [facility]. Pt completed toilet transfer by squat pivot with Min A. Pt required Max A for toileting tasks but able to lean for posterior cleaning. Pt squat pivoted back to EOB to complete UB self-care with CGA. Pt able to donn and doff UB dressing with CGA sitting EOB. Pt required Max A for donning LB dressing via lateral leans and rolling L and R in bed. Pt was left lying supine with HOB elevated, bed alarm on, all needs met.   Session 2: Met pt lying in bed, safety belt and bed alarm on, pt reluctant to session. Treatment session was focused on sit > stand transfers and functional mobility. OTS offered oral care sitting EOB, pt agreed but perseverated on staying in bed due to fatigue from previous session. Pt completed bed mobility with CGA > EOB. Pt completed oral care tasks with set-up assist. Pt able to laterally lean L to reach for horseshoes and maintain posture. OT and OTS encouraged pt to participate in sit > stand transfers, pt agreed. Pt able to complete sit > stand transfer  with Max A. Pt was Min A for stand > sit EOB. Pt declined another sit > stand and expressed "let's give today a break". Pt requested to remove socks before lying in bed, required Max A. Pt able to adjust body in bed using bed rails. Pt left supine with HOB elevated, safety belt and bed alarm on, all needs met.   Therapy Documentation Precautions:  Precautions Precautions: Fall Precaution Comments: L AKA Restrictions Weight Bearing Restrictions: Yes LLE Weight Bearing: Non weight bearing Pain: Pain Assessment Pain Scale: 0-10 Pain Score: 6  Pain Type: Chronic pain Pain Location: Groin Pain Orientation: Right Pain Descriptors / Indicators: Aching Pain Onset: Sudden Patients Stated Pain Goal: 0 Pain Intervention(s): Repositioned   Therapy/Group: Individual Therapy  Cristel Rail 05/28/2021, 11:12 AM

## 2021-05-28 NOTE — Progress Notes (Signed)
Patient ID: Todd Duran, male   DOB: 12/07/1945, 75 y.o.   MRN: [id number]  SW faxed HHPT/OT/SN/aide order to [facility] SW (p:[phone]/f:[phone]).  Cecile Sheerer, MSW, LCSWA Office: [phone] Cell: [phone] Fax: [phone]

## 2021-05-28 NOTE — Progress Notes (Signed)
PROGRESS NOTE   Subjective/Complaints: Mutiple mushy stools/blobs overnight. Last of which was at 0830. Denies cramps, pain, fever. Low back still sore,   ROS: Patient denies fever, rash, sore throat, blurred vision, nausea, vomiting, cough, shortness of breath or chest pain,   headache, or mood change.    Objective:   No results found. Recent Labs    05/25/21 1530 05/26/21 0443  WBC 7.1 7.0  HGB 8.8* 8.4*  HCT 28.0* 26.5*  PLT 561* 513*   Recent Labs    05/26/21 0443 05/28/21 0719  NA 133* 133*  K 4.0 3.9  CL 102 100  CO2 25 24  GLUCOSE 113* 185*  BUN 23 20  CREATININE 0.77 0.81  CALCIUM 8.8* 9.1    Intake/Output Summary (Last 24 hours) at 05/28/2021 1048 Last data filed at 05/28/2021 0700 Gross per 24 hour  Intake 540 ml  Output 200 ml  Net 340 ml        Physical Exam: Vital Signs Blood pressure 122/70, pulse 80, temperature 98.4 F (36.9 C), resp. rate 17, height 5\' 11"  (1.803 m), weight 53.9 kg, SpO2 100 %.  Constitutional: No distress . Vital signs reviewed. HEENT: EOMI, oral membranes moist Neck: supple Cardiovascular: RRR without murmur. No JVD    Respiratory/Chest: CTA Bilaterally without wheezes or rales. Normal effort    GI/Abdomen: BS +, non-tender, non-distended Ext: no clubbing, cyanosis, or edema Psych: pleasant and cooperative  Skin: left AKA stump intact, just re-wrapped and dressed with ACE  Neuro: Pt is cognitively appropriate with normal insight, memory, and awareness. Cranial nerves 2-12 are intact. Sensory exam is normal. Reflexes are 2+ in all 4's. Fine motor coordination is intact. No tremors. Motor function is grossly 5/5 UE. LLE 3/5. RLE 4-5/5.   Musculoskeletal: Full ROM, No pain with AROM or PROM in the neck, trunk, or extremities. Posture appropriate    Assessment/Plan: 1. Functional deficits which require 3+ hours per day of interdisciplinary therapy in a comprehensive  inpatient rehab setting. Physiatrist is providing close team supervision and 24 hour management of active medical problems listed below. Physiatrist and rehab team continue to assess barriers to discharge/monitor patient progress toward functional and medical goals  Care Tool:  Bathing    Body parts bathed by patient: Face, Right arm, Left arm, Chest, Abdomen   Body parts bathed by helper: Front perineal area, Buttocks Body parts n/a: Left lower leg   Bathing assist Assist Level: Moderate Assistance - Patient 50 - 74%     Upper Body Dressing/Undressing Upper body dressing   What is the patient wearing?: Pull over shirt    Upper body assist Assist Level: Contact Guard/Touching assist    Lower Body Dressing/Undressing Lower body dressing      What is the patient wearing?: Pants, Incontinence brief     Lower body assist Assist for lower body dressing: Maximal Assistance - Patient 25 - 49%     Toileting Toileting Toileting Activity did not occur (Clothing management and hygiene only): N/A (no void or bm)  Toileting assist Assist for toileting: Maximal Assistance - Patient 25 - 49%     Transfers Chair/bed transfer  Transfers assist  Chair/bed transfer assist level: Moderate Assistance - Patient 50 - 74%     Locomotion Ambulation   Ambulation assist   Ambulation activity did not occur: Safety/medical concerns  Assist level: Minimal Assistance - Patient > 75% Assistive device: Parallel bars Max distance: 5'   Walk 10 feet activity   Assist  Walk 10 feet activity did not occur: Safety/medical concerns        Walk 50 feet activity   Assist Walk 50 feet with 2 turns activity did not occur: Safety/medical concerns         Walk 150 feet activity   Assist Walk 150 feet activity did not occur: Safety/medical concerns         Walk 10 feet on uneven surface  activity   Assist Walk 10 feet on uneven surfaces activity did not occur:  Safety/medical concerns         Wheelchair     Assist Will patient use wheelchair at discharge?: Yes Type of Wheelchair: Manual    Wheelchair assist level: Supervision/Verbal cueing Max wheelchair distance: 150'    Wheelchair 50 feet with 2 turns activity    Assist        Assist Level: Supervision/Verbal cueing   Wheelchair 150 feet activity     Assist      Assist Level: Supervision/Verbal cueing   Blood pressure 122/70, pulse 80, temperature 98.4 F (36.9 C), resp. rate 17, height 5\' 11"  (1.803 m), weight 53.9 kg, SpO2 100 %.   Medical Problem List and Plan: 1.  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  Antithrombotics: -DVT/anticoagulation: Subcutaneous heparin             -antiplatelet therapy: Aspirin 81 mg daily 3. Pain Management: Oxycodone as needed             -massage and elevation to control pain/edema as well.  -oob to chair for LB pain.  4. Mood: Provide emotional support             -antipsychotic agents: N/A 5. Neuropsych: This patient is capable of making decisions on his own behalf. 6. Skin/Wound Care: Routine skin checks             -dry dressing and ACE wrap to stump daily for now---transition to shrinker once drainage subsides a bit 7. Fluids/Electrolytes/Nutrition:    -BUN/Cr improved 7/21. Continue encourage fluids 8.  Acute blood loss anemia.  Transfused 2 units 05/22/2021.  Follow-up hgb 8.4 today   -CBC pending 9.  Hypertension.  Norvasc 10 mg daily, Tenormin 25 mg twice daily.     -normotensive at present 10.  Hyperlipidemia.  Lipitor 11.  Diabetes mellitus.  Hemoglobin A1c 5.2.  SSI.  Patient on Glucophage 500 mg daily prior to admission.   -CBG (last 3)  Recent Labs    05/27/21 1644 05/27/21 2057 05/28/21 0605   GLUCAP 111* 124* 117*   -continue off meds 7/21 12.  History of tobacco alcohol use.  Provide counseling 13.  Hyponatremia.  mild 133, same 7/21 14. SLow transit constipation:  7.21 moving bowels now, frequently today  -likely effect from sorbitol  -hold colace for now-reassess tomorrow  LOS: 3 days A FACE TO FACE EVALUATION WAS PERFORMED  Ranelle Oyster 05/28/2021, 10:48 AM

## 2021-05-29 LAB — GLUCOSE, CAPILLARY
Glucose-Capillary: 228 mg/dL — ABNORMAL HIGH (ref 70–99)
Glucose-Capillary: 128 mg/dL — ABNORMAL HIGH (ref 70–99)
Glucose-Capillary: 126 mg/dL — ABNORMAL HIGH (ref 70–99)
Glucose-Capillary: 126 mg/dL — ABNORMAL HIGH (ref 70–99)

## 2021-05-29 LAB — CBC
HCT: 23.8 % — ABNORMAL LOW (ref 39.0–52.0)
Hemoglobin: 7.6 g/dL — ABNORMAL LOW (ref 13.0–17.0)
MCH: 27.4 pg (ref 26.0–34.0)
MCHC: 31.9 g/dL (ref 30.0–36.0)
MCV: 85.9 fL (ref 80.0–100.0)
Platelets: 534 10*3/uL — ABNORMAL HIGH (ref 150–400)
RBC: 2.77 MIL/uL — ABNORMAL LOW (ref 4.22–5.81)
RDW: 15.4 % (ref 11.5–15.5)
WBC: 7.1 10*3/uL (ref 4.0–10.5)
nRBC: 0 % (ref 0.0–0.2)

## 2021-05-29 MED ORDER — METFORMIN HCL 500 MG PO TABS
500.0000 mg | ORAL_TABLET | Freq: Every day | ORAL | Status: DC
  Administered 2021-05-29 – 2021-06-04 (×7): 500 mg via ORAL
  Filled 2021-05-29 (×7): qty 1

## 2021-05-29 NOTE — Progress Notes (Signed)
PROGRESS NOTE   Subjective/Complaints: Bowels have slowed down. Feels well this morning. Pain controlled in stump. Low back a little sore  ROS: Patient denies fever, rash, sore throat, blurred vision, nausea, vomiting, diarrhea, cough, shortness of breath or chest pain,   headache, or mood change.    Objective:   No results found. Recent Labs    05/29/21 0437  WBC 7.1  HGB 7.6*  HCT 23.8*  PLT 534*   Recent Labs    05/28/21 0719  NA 133*  K 3.9  CL 100  CO2 24  GLUCOSE 185*  BUN 20  CREATININE 0.81  CALCIUM 9.1    Intake/Output Summary (Last 24 hours) at 05/29/2021 1246 Last data filed at 05/29/2021 0700 Gross per 24 hour  Intake 540 ml  Output 200 ml  Net 340 ml        Physical Exam: Vital Signs Blood pressure 137/76, pulse 77, temperature 98.4 F (36.9 C), temperature source Oral, resp. rate 18, height 5\' 11"  (1.803 m), weight 53.9 kg, SpO2 98 %.  Constitutional: No distress . Vital signs reviewed. HEENT: EOMI, oral membranes moist Neck: supple Cardiovascular: RRR without murmur. No JVD    Respiratory/Chest: CTA Bilaterally without wheezes or rales. Normal effort    GI/Abdomen: BS +, non-tender, non-distended Ext: no clubbing, cyanosis, or edema Psych: pleasant and cooperative   Skin: left AKA incision intact. Sl serous drainage.  Neuro: Pt is cognitively appropriate with normal insight, memory, and awareness. Cranial nerves 2-12 are intact. Sensory exam is normal. Reflexes are 2+ in all 4's. Fine motor coordination is intact. No tremors. Motor function is grossly 5/5 UE. LLE 3/5. RLE 4-5/5.   Musculoskeletal: low back mildly TTP. Stump with mild tenderness also, still swollen   Assessment/Plan: 1. Functional deficits which require 3+ hours per day of interdisciplinary therapy in a comprehensive inpatient rehab setting. Physiatrist is providing close team supervision and 24 hour management of  active medical problems listed below. Physiatrist and rehab team continue to assess barriers to discharge/monitor patient progress toward functional and medical goals  Care Tool:  Bathing    Body parts bathed by patient: Face, Right arm, Left arm, Chest, Abdomen   Body parts bathed by helper: Front perineal area, Buttocks Body parts n/a: Left lower leg   Bathing assist Assist Level: Moderate Assistance - Patient 50 - 74%     Upper Body Dressing/Undressing Upper body dressing   What is the patient wearing?: Pull over shirt    Upper body assist Assist Level: Contact Guard/Touching assist    Lower Body Dressing/Undressing Lower body dressing      What is the patient wearing?: Pants, Incontinence brief     Lower body assist Assist for lower body dressing: Maximal Assistance - Patient 25 - 49%     Toileting Toileting Toileting Activity did not occur (Clothing management and hygiene only): N/A (no void or bm)  Toileting assist Assist for toileting: Maximal Assistance - Patient 25 - 49%     Transfers Chair/bed transfer  Transfers assist     Chair/bed transfer assist level: Moderate Assistance - Patient 50 - 74%     Locomotion Ambulation   Ambulation assist  Ambulation activity did not occur: Safety/medical concerns  Assist level: Minimal Assistance - Patient > 75% Assistive device: Parallel bars Max distance: 5'   Walk 10 feet activity   Assist  Walk 10 feet activity did not occur: Safety/medical concerns        Walk 50 feet activity   Assist Walk 50 feet with 2 turns activity did not occur: Safety/medical concerns         Walk 150 feet activity   Assist Walk 150 feet activity did not occur: Safety/medical concerns         Walk 10 feet on uneven surface  activity   Assist Walk 10 feet on uneven surfaces activity did not occur: Safety/medical concerns         Wheelchair     Assist Will patient use wheelchair at discharge?:  Yes Type of Wheelchair: Manual    Wheelchair assist level: Supervision/Verbal cueing Max wheelchair distance: 150'    Wheelchair 50 feet with 2 turns activity    Assist        Assist Level: Supervision/Verbal cueing   Wheelchair 150 feet activity     Assist      Assist Level: Supervision/Verbal cueing   Blood pressure 137/76, pulse 77, temperature 98.4 F (36.9 C), temperature source Oral, resp. rate 18, height 5\' 11"  (1.803 m), weight 53.9 kg, SpO2 98 %.   Medical Problem List and Plan: 1.  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  Antithrombotics: -DVT/anticoagulation: Subcutaneous heparin             -antiplatelet therapy: Aspirin 81 mg daily 3. Pain Management: Oxycodone as needed             -massage and elevation to control pain/edema as well.  -oob to chair for LB pain.  4. Mood: Provide emotional support             -antipsychotic agents: N/A 5. Neuropsych: This patient is capable of making decisions on his own behalf. 6. Skin/Wound Care: Routine skin checks             -continue dry dressing/ACE at least thru weekend---> shrinker 7. Fluids/Electrolytes/Nutrition:    -BUN/Cr improved 7/21. Continue encourage fluids 8.  Acute blood loss anemia.  Transfused 2 units 05/22/2021.  Follow-up hgb 8.4 today   -counts slowly drifting down again---8.8->8.4->7.6 7/22   -likely all post-op   -will check stool for ob   -recheck cbc Monday 9.  Hypertension.  Norvasc 10 mg daily, Tenormin 25 mg twice daily.     -normotensive at present 10.  Hyperlipidemia.  Lipitor 11.  Diabetes mellitus.  Hemoglobin A1c 5.2.  SSI.  Patient on Glucophage 500 mg daily prior to admission.   -CBG (last 3)  Recent Labs    05/28/21 2111 05/29/21 0559 05/29/21 1202  GLUCAP  176* 228* 128*   -resume glucphage 500mg  7/21 12.  History of tobacco alcohol use.  Provide counseling 13.  Hyponatremia.  mild 133, same 7/21 14. SLow transit constipation:  Responded to sorbitol-- -holding colace for now    LOS: 4 days A FACE TO FACE EVALUATION WAS PERFORMED  8/21 05/29/2021, 12:46 PM

## 2021-05-29 NOTE — Progress Notes (Signed)
Occupational Therapy Session Note  Patient Details  Name: Todd Duran MRN: [id number] Date of Birth: November 15, 1945  Today's Date: 05/29/2021 OT Individual Time: 496-759    16 min  Today's Date: 05/29/2021 OT Individual Time: 1453-1520 OT Individual Time Calculation (min): 27 min   Short Term Goals: Week 1:  OT Short Term Goal 1 (Week 1): Pt will complete LB dressing with min A and use of AE, as needed. OT Short Term Goal 2 (Week 1): Pt will complete toileting task with min A. OT Short Term Goal 3 (Week 1): Pt will complete functional transfers (toilet, shower) with min A.  Skilled Therapeutic Interventions/Progress Updates:     Pt received in bed with "nothing more than the usual pain" respositioning and rest provided for pain relief. Pt requires encouragement to participate. OT selects TTB for in bathroom for when pt feels up to showering.  ADL: Squat pivot transfer OOB with CGA overall.  Pt completes bathing with set up for UB bathing and EOB LB bathing with Sup A for LB bathing at lateral lean level  Education on TTB to utilize in shower for bathing with residual limb covered. Pt completes UB dressing with set up Pt completes LB dressing with CGA for donning LB clothing at seated level at EOB d/t poor trunk control Pt completes footwear with CGA to don sock seated in w/c.   Pt left at end of session in bed with exit alarm on, call light in reach and all needs met  Session 2:  Pt received in bed with family present and mild pain in residual limb with respositioning provided at end of session for comfort  Therapeutic activity Focus of session on functional mobility with S level lateral scoot transfers into/out of bed<>chair. Pt completes 2 STS at high low table with MOD A to power up and VC for upright posture and CoG shift to R over R leg.  Pt requesting to use vending machine selecting oatmeal creme pie for snack option. Education on impact of diabetes/sugary foods on blood  sugars and healing with OT pointing out other healthier options- trail mix, pistacios, veggie straws, pretzels. Pt chooses "cheetos" instead. However no evidence of learning as on the way back pt states, "wait I got the creme pie"  Pt left at end of session in w/c with exit alarm on, call light in reach and all needs met   Therapy Documentation Precautions:  Precautions Precautions: Fall Precaution Comments: L AKA Restrictions Weight Bearing Restrictions: Yes LLE Weight Bearing: Non weight bearing General:   Vital Signs: Therapy Vitals Temp: 98.4 F (36.9 C) Temp Source: Oral Pulse Rate: 77 Resp: 18 BP: 137/76 Patient Position (if appropriate): Lying Oxygen Therapy SpO2: 98 % O2 Device: Room Air Pain:   ADL: ADL Eating: Set up Grooming: Setup Upper Body Bathing: Setup Lower Body Bathing: Moderate assistance, Maximal assistance Upper Body Dressing: Minimal assistance Lower Body Dressing: Moderate assistance, Maximal assistance Toileting: Maximal assistance Toilet Transfer: Maximal assistance, Moderate assistance Vision   Perception    Praxis   Exercises:   Other Treatments:     Therapy/Group: Individual Therapy  Tonny Branch 05/29/2021, 6:40 AM

## 2021-05-29 NOTE — Progress Notes (Signed)
Physical Therapy Session Note  Patient Details  Name: Todd Duran MRN: [id number] Date of Birth: 1945/12/15  Today's Date: 05/29/2021 PT Individual Time: 0835-0900 and 1120-1200 PT Individual Time Calculation (min): 25 min and 40 min  Short Term Goals: Week 1:  PT Short Term Goal 1 (Week 1): Patient with perform basic transfers with min A using LRAD. PT Short Term Goal 2 (Week 1): Pt will ambulate x25' with minA and LRAD. PT Short Term Goal 3 (Week 1): Patient with perform standing activities with mod A >5 min.  Skilled Therapeutic Interventions/Progress Updates:     Pt received supine in bed and agrees to therapy. NO complaint of pain. Supine to sit with verbal cues for logrolling technique and positioning at EOB. Pt performs squat pivot to WC with CGA and cues for positioning and sequencing. Session focused on [facility] propulsion for community reintegration as well as strength and endurance training. Pt self propels WC with bilateral upper extremities >1000' during session. Pt navigates elevator as well as crowded environments, and through doorways over thresholds to outdoors. PT provides verbal cues for efficient propulsion technique outdoors with graded slopes and varying surface textures. Pt requires seated rest break prior to propelling back inside, up elevator, and back to room. Squat pivot to bed with CGA. Left supine with alarm intact and all needs within reach.  2nd session: pt received supine in bed and agrees to therapy. Reports some nerve pain in legs, but no number provided. PT provides repositioning and rest breaks as needed. Bed mobility with cues on positioning and squat pivot to WC with CGA. WC transport to gym for time management and energy conservation. Pt participates in drum group for 5 minutes to work on activity tolerance as well as bilateral upper extremity coordination. Pt transfer to mat with CGA. Remainder of session working on sit to stand and strengthening of R leg. Hard  backed chair turned backward to use as upper extremity support and to provide pt with R knee blocking in the case of buckling, through pt does not buckle during session. Pt requires consistent verbal cueing on hand placement and sequencing of transfer, and typically modA but occasional minA when motor planning and weight shift is adequate. Pt tasked with standing to fatigue and each rep able to stand ~1 minute, with come verbal encouragement. Pt requires extended seated rest break. WC transport back to room. Squat pivot back to bed with CGA. Left supine with alarm intact and all needs within reach.  Therapy Documentation Precautions:  Precautions Precautions: Fall Precaution Comments: L AKA Restrictions Weight Bearing Restrictions: Yes LLE Weight Bearing: Non weight bearing    Therapy/Group: Individual Therapy  Beau Fanny, PT, DPT 05/29/2021, 3:59 PM

## 2021-05-30 LAB — GLUCOSE, CAPILLARY
Glucose-Capillary: 110 mg/dL — ABNORMAL HIGH (ref 70–99)
Glucose-Capillary: 115 mg/dL — ABNORMAL HIGH (ref 70–99)
Glucose-Capillary: 141 mg/dL — ABNORMAL HIGH (ref 70–99)

## 2021-05-30 MED ORDER — GABAPENTIN 100 MG PO CAPS
100.0000 mg | ORAL_CAPSULE | Freq: Two times a day (BID) | ORAL | Status: DC
  Administered 2021-05-30 – 2021-06-05 (×13): 100 mg via ORAL
  Filled 2021-05-30 (×13): qty 1

## 2021-05-30 NOTE — Progress Notes (Signed)
PROGRESS NOTE   Subjective/Complaints: Per PT limited by RLE pain, pt c/o numbness in foot but also with pain , had this issue at home as well  Left stump not painful but itchy  ROS: Patient denies CP, SOB, N/V/D  Objective:   No results found. Recent Labs    05/29/21 0437  WBC 7.1  HGB 7.6*  HCT 23.8*  PLT 534*    Recent Labs    05/28/21 0719  NA 133*  K 3.9  CL 100  CO2 24  GLUCOSE 185*  BUN 20  CREATININE 0.81  CALCIUM 9.1     Intake/Output Summary (Last 24 hours) at 05/30/2021 0905 Last data filed at 05/30/2021 0800 Gross per 24 hour  Intake 360 ml  Output 700 ml  Net -340 ml         Physical Exam: Vital Signs Blood pressure 135/84, pulse 86, temperature 98.3 F (36.8 C), resp. rate 20, height 5\' 11"  (1.803 m), weight 53.9 kg, SpO2 100 %.  General: No acute distress Mood and affect are appropriate Heart: Regular rate and rhythm no rubs murmurs or extra sounds Lungs: Clear to auscultation, breathing unlabored, no rales or wheezes Abdomen: Positive bowel sounds, soft nontender to palpation, nondistended Extremities: No clubbing, cyanosis, or edema, Right toes slightly cool no discoloration reduced dorsal pedal pulse  Skin: No evidence of breakdown, no evidence of rash, left stump with shrinker  Right foot no lesions MSK no joint swelling Right foot and ankle   Neuro: Pt is cognitively appropriate with normal insight, memory, and awareness. Cranial nerves 2-12 are intact. Sensory exam absent LT below the Right knee  Reflexes are 2+ in all 4's. Fine motor coordination is intact. No tremors. Motor function is grossly 5/5 UE. LLE 3/5. RLE 4-5/5.   Musculoskeletal: low back mildly TTP. Stump with mild tenderness also, still swollen   Assessment/Plan: 1. Functional deficits which require 3+ hours per day of interdisciplinary therapy in a comprehensive inpatient rehab setting. Physiatrist is  providing close team supervision and 24 hour management of active medical problems listed below. Physiatrist and rehab team continue to assess barriers to discharge/monitor patient progress toward functional and medical goals  Care Tool:  Bathing    Body parts bathed by patient: Face, Right arm, Left arm, Chest, Abdomen   Body parts bathed by helper: Front perineal area, Buttocks Body parts n/a: Left lower leg   Bathing assist Assist Level: Moderate Assistance - Patient 50 - 74%     Upper Body Dressing/Undressing Upper body dressing   What is the patient wearing?: Pull over shirt    Upper body assist Assist Level: Contact Guard/Touching assist    Lower Body Dressing/Undressing Lower body dressing      What is the patient wearing?: Pants, Incontinence brief     Lower body assist Assist for lower body dressing: Maximal Assistance - Patient 25 - 49%     Toileting Toileting Toileting Activity did not occur (Clothing management and hygiene only): N/A (no void or bm)  Toileting assist Assist for toileting: Maximal Assistance - Patient 25 - 49%     Transfers Chair/bed transfer  Transfers assist     Chair/bed  transfer assist level: Moderate Assistance - Patient 50 - 74%     Locomotion Ambulation   Ambulation assist   Ambulation activity did not occur: Safety/medical concerns  Assist level: Minimal Assistance - Patient > 75% Assistive device: Parallel bars Max distance: 5'   Walk 10 feet activity   Assist  Walk 10 feet activity did not occur: Safety/medical concerns        Walk 50 feet activity   Assist Walk 50 feet with 2 turns activity did not occur: Safety/medical concerns         Walk 150 feet activity   Assist Walk 150 feet activity did not occur: Safety/medical concerns         Walk 10 feet on uneven surface  activity   Assist Walk 10 feet on uneven surfaces activity did not occur: Safety/medical concerns          Wheelchair     Assist Will patient use wheelchair at discharge?: Yes Type of Wheelchair: Manual    Wheelchair assist level: Supervision/Verbal cueing Max wheelchair distance: 150'    Wheelchair 50 feet with 2 turns activity    Assist        Assist Level: Supervision/Verbal cueing   Wheelchair 150 feet activity     Assist      Assist Level: Supervision/Verbal cueing   Blood pressure 135/84, pulse 86, temperature 98.3 F (36.8 C), resp. rate 20, height 5\' 11"  (1.803 m), weight 53.9 kg, SpO2 100 %.   Medical Problem List and Plan: 1.  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  Antithrombotics: -DVT/anticoagulation: Subcutaneous heparin             -antiplatelet therapy: Aspirin 81 mg daily 3. Pain Management: Oxycodone as needed Peripheral neuropathy pain RIght foot add gabapentin              -massage and elevation to control pain/edema as well.  -oob to chair for LB pain.  4. Mood: Provide emotional support             -antipsychotic agents: N/A 5. Neuropsych: This patient is capable of making decisions on his own behalf. 6. Skin/Wound Care: Routine skin checks             -continue dry dressing/ACE at least thru weekend---> shrinker 7. Fluids/Electrolytes/Nutrition:    -BUN/Cr improved 7/21. Continue encourage fluids 8.  Acute blood loss anemia.  Transfused 2 units 05/22/2021.  Follow-up hgb 8.4 today   -counts slowly drifting down again---8.8->8.4->7.6 7/22   -likely all post-op   -will check stool for ob   -recheck cbc Monday 9.  Hypertension.  Norvasc 10 mg daily, Tenormin 25 mg twice daily.     -normotensive at present 10.  Hyperlipidemia.  Lipitor 11.  Diabetes mellitus.  Hemoglobin A1c 5.2.  SSI.  Patient on Glucophage 500 mg daily prior  to admission.   -CBG (last 3)  Recent Labs    05/29/21 1624 05/29/21 2104 05/30/21 0609  GLUCAP 126* 126* 110*    -resume glucphage 500mg  7/21 12.  History of tobacco alcohol use.  Provide counseling 13.  Hyponatremia.  mild 133, same 7/21 14. SLow transit constipation:  Responded to sorbitol-- -holding colace for now    LOS: 5 days A FACE TO FACE EVALUATION WAS PERFORMED  Erick Colace 05/30/2021, 9:05 AM

## 2021-05-30 NOTE — Progress Notes (Signed)
Occupational Therapy Session Note  Patient Details  Name: Todd Duran MRN: [id number] Date of Birth: 12/15/1945  Session 1 10 min [phone]   Today's Date: 05/30/2021 OT Individual Time: 1430-1515 OT Individual Time Calculation (min): 45 min  Short Term Goals: Week 1:  OT Short Term Goal 1 (Week 1): Pt will complete LB dressing with min A and use of AE, as needed. OT Short Term Goal 2 (Week 1): Pt will complete toileting task with min A. OT Short Term Goal 3 (Week 1): Pt will complete functional transfers (toilet, shower) with min A.  Skilled Therapeutic Interventions/Progress Updates:     Pt received in recliner with unrated RLE pain. Pt educated on TTB transfer via squat pivot to encourage pt to shower this date. Pt verbalized understanding, but reporting not "wanting to work this weekend and rest up for Monday." OT educated on activity participation to improve independnece with functional endurance and transfers. PT continues to decline tx. Will follow up for continued tx this date as available per POC Pt left at end of session in bed with exit alarm on, call light in reach and all needs met  Session 2:  Pt received in recliner with RLE neuropathic pain alleviated when positioned LE down.  ADL:  Pt completes LB dressing with supervision and VC for bridging hips with RLE to advance pants pas thips Pt completes footwear with significantlyincreased time, use of stool and VC for 1 handed sock donning [employer] from recliner Pt completes grooming seated in w/c at sink with set up Pt completes shower/Tub transfer for practice as a dry run with supervision for squat pivot transfer from w/c to TTB in bathroom.    Pt left at end of session in w/c with exit alarm on, call light in reach and all needs met   Therapy Documentation Precautions:  Precautions Precautions: Fall Precaution Comments: L AKA Restrictions Weight Bearing Restrictions: Yes LLE Weight Bearing: Non weight  bearing General:   Vital Signs: Therapy Vitals Temp: 98.3 F (36.8 C) Pulse Rate: 86 Resp: 20 BP: 135/84 Patient Position (if appropriate): Lying Oxygen Therapy SpO2: 100 % O2 Device: Room Air Pain:   ADL: ADL Eating: Set up Grooming: Setup Upper Body Bathing: Setup Lower Body Bathing: Moderate assistance, Maximal assistance Upper Body Dressing: Minimal assistance Lower Body Dressing: Moderate assistance, Maximal assistance Toileting: Maximal assistance Toilet Transfer: Maximal assistance, Moderate assistance Vision   Perception    Praxis   Exercises:   Other Treatments:     Therapy/Group: Individual Therapy  Tonny Branch 05/30/2021, 6:49 AM

## 2021-05-30 NOTE — Progress Notes (Addendum)
Physical Therapy Session Note  Patient Details  Name: Todd Duran MRN: [id number] Date of Birth: November 03, 1946  Today's Date: 05/30/2021 PT Individual Time: 0802-0901; 2409-7353 PT Individual Time Calculation (min): 59 min , 30 min  Short Term Goals: Week 1:  PT Short Term Goal 1 (Week 1): Patient with perform basic transfers with min A using LRAD. PT Short Term Goal 2 (Week 1): Pt will ambulate x25' with minA and LRAD. PT Short Term Goal 3 (Week 1): Patient with perform standing activities with mod A >5 min.  Skilled Therapeutic Interventions/Progress Updates:  Tx 1:  Pt resting in bed.  He reported "bad" pain in his RLLE, pin and needles; no number given.  Premedicated x 1 hr per pt. At end of session, pt informed Todd Bosworth, RN and Todd Duran that RLE neuropathic pain was more limiting to pt than surgical pain.   Rolling L><R with supervision and cues for assistance pulling pants over hips.  PT donned R TED and non slip sock. ACE wrap intact L residual limb.   Supine> sit with supervision, slowly due to pain.  Pt performed squat /scoot transfer to L bed> wc;/recliner with CGA and assistance for wc set up.      Wc propulsion from [facility] unit to/from 4W unit therapy room, > 400', with supervision.  Pt needed cues for safety of R foot during turns into elevator and in congested room.    In parallel bars, sit>< stand, x 3: 30 seconds, 70 seconds, 60 seconds. Pt pulled up on bars to stand with min/mod assist, after scooting forward with cues.  Pt benefited from using a large clock on table nearby, to track his standing durations.    Therapeutic exercises performed with LEs to increase strength for functional mobility. Seated in wc: 10 x 1 R long arc quad knee extension, R ankle DF with flexed knee and with extended knee, bil adductor squeezes, heel raises.  Gently PROM R ankle DF.   Pt insisted on buying an oatmeal cream cake from vending machine on his way back to his room; he ignored PT's  efforts at diabetic ed. At end of session, pt resting in recliner with seat pad alarm set and needs at hand.   Tx 2:  Pt resting in w/c.  He reported "a little bit" of pain R foot.  Premedicated.  Therapeutic exercises performed with LE to increase strength for functional mobility. -seated in wc: 10 x 1 R heel raises, R closed chain hip and knee extension, trunk extension/flexion without use of UEs.  Prone lying x 2 minutes for L hip flexor stretch; duration limited by pain distal end of residual limb.    Transfer training for moving COG forward: 10 x 1 trunk flexion/extension with R foot firmly on floor.  Supine>< prone with extra time, supervision.   Attempted sit> stand using bil hands on wall railing, but pt unable due to pressure on L residual limb.  Squat pivot to R to return to bed, CGA.    At end of session, pt resting in bed with alarm set and needs at hand.      Therapy Documentation Precautions:  Precautions Precautions: Fall Precaution Comments: L AKA Restrictions Weight Bearing Restrictions: Yes LLE Weight Bearing: Non weight bearing      Therapy/Group: Individual Therapy  Todd Duran 05/30/2021, 10:14 AM

## 2021-05-31 LAB — GLUCOSE, CAPILLARY
Glucose-Capillary: 110 mg/dL — ABNORMAL HIGH (ref 70–99)
Glucose-Capillary: 116 mg/dL — ABNORMAL HIGH (ref 70–99)
Glucose-Capillary: 153 mg/dL — ABNORMAL HIGH (ref 70–99)
Glucose-Capillary: 117 mg/dL — ABNORMAL HIGH (ref 70–99)

## 2021-06-01 LAB — CBC
HCT: 23.3 % — ABNORMAL LOW (ref 39.0–52.0)
Hemoglobin: 7.2 g/dL — ABNORMAL LOW (ref 13.0–17.0)
MCH: 26.9 pg (ref 26.0–34.0)
MCHC: 30.9 g/dL (ref 30.0–36.0)
MCV: 86.9 fL (ref 80.0–100.0)
Platelets: 560 10*3/uL — ABNORMAL HIGH (ref 150–400)
RBC: 2.68 MIL/uL — ABNORMAL LOW (ref 4.22–5.81)
RDW: 15.5 % (ref 11.5–15.5)
WBC: 7.8 10*3/uL (ref 4.0–10.5)
nRBC: 0 % (ref 0.0–0.2)

## 2021-06-01 LAB — GLUCOSE, CAPILLARY
Glucose-Capillary: 102 mg/dL — ABNORMAL HIGH (ref 70–99)
Glucose-Capillary: 109 mg/dL — ABNORMAL HIGH (ref 70–99)
Glucose-Capillary: 101 mg/dL — ABNORMAL HIGH (ref 70–99)
Glucose-Capillary: 142 mg/dL — ABNORMAL HIGH (ref 70–99)

## 2021-06-01 MED ORDER — DAKINS (1/4 STRENGTH) 0.125 % EX SOLN
Freq: Two times a day (BID) | CUTANEOUS | Status: AC
  Filled 2021-06-01: qty 473

## 2021-06-01 MED ORDER — FERROUS SULFATE 325 (65 FE) MG PO TABS
325.0000 mg | ORAL_TABLET | Freq: Every day | ORAL | Status: DC
  Administered 2021-06-01 – 2021-06-04 (×4): 325 mg via ORAL
  Filled 2021-06-01 (×4): qty 1

## 2021-06-01 NOTE — Progress Notes (Signed)
Physical Therapy Session Note  Patient Details  Name: Todd Duran MRN: [id number] Date of Birth: [birth year]  Today's Date: 06/01/2021 PT Individual Time: 1445-1455 PT Individual Time Calculation (min): 10 min   Short Term Goals: Week 1:  PT Short Term Goal 1 (Week 1): Patient with perform basic transfers with min A using LRAD. PT Short Term Goal 2 (Week 1): Pt will ambulate x25' with minA and LRAD. PT Short Term Goal 3 (Week 1): Patient with perform standing activities with mod A >5 min.  Skilled Therapeutic Interventions/Progress Updates:    Pt received seated in bed, just received shrinker from [employer]. Education with patient regarding donning/doffing shrinker, shrinker wearing schedule, and purpose of shrinker for edema management, limb shaping, and desensitization. Pt declines any further participation in therapy session following education due to fatigue. Reviewed rehab schedule including 3 hours/day needed or possiblity of 15/7 schedule if patient is not able to tolerate a full 3 hours/day. Pt continues to decline participation despite education regarding importance of participation in therapy session and importance of meeting therapy minutes. Pt missed 65 min of scheduled skilled therapy session due to refusal. Pt left supine in bed with needs in reach, bed alarm in place.  Therapy Documentation Precautions:  Precautions Precautions: Fall Precaution Comments: L AKA Restrictions Weight Bearing Restrictions: Yes LLE Weight Bearing: Non weight bearing General: PT Amount of Missed Time (min): 65 Minutes PT Missed Treatment Reason: Patient unwilling to participate      Therapy/Group: Individual Therapy   Peter Congo, PT, DPT, CSRS  06/01/2021, 5:08 PM

## 2021-06-01 NOTE — Progress Notes (Signed)
Physical Therapy Session Note  Patient Details  Name: Todd Duran MRN: [id number] Date of Birth: 21-May-1946  Today's Date: 06/01/2021 PT Individual Time: 1110-1205 PT Individual Time Calculation (min): 55 min   Short Term Goals: Week 1:  PT Short Term Goal 1 (Week 1): Patient with perform basic transfers with min A using LRAD. PT Short Term Goal 2 (Week 1): Pt will ambulate x25' with minA and LRAD. PT Short Term Goal 3 (Week 1): Patient with perform standing activities with mod A >5 min.  Skilled Therapeutic Interventions/Progress Updates:    Patient in supine and reports MD took off dressing this morning to check wound and needs re-dressing.  Checked with RN and applied kerlix and ace wrap to L AKA residual limb.  Patient finished donned pants with min A in supine and performed supine to sit with S and increased time.  Transfer via scoot pivot to w/c with min A.  Patient propelled w/c x 200' to therapy gym with including down on elevator.  Patient transferred to mat via scoot pivot with min A after mod cues for w/c set up.  Patient performed sit <>stand to RW from slight elevated mat with min A standing for 30 sec then 2 x 60 sec with min A and mod cues for postural support. Patient c/o back pain as noted so transferred back to w/c scoot/squat pivot with min A and mod cues.  Patient performed seated postural exercises including bilateral horizontal abduction with orange t-band x 10, attempted w/c push up, but pt unable so performed shoulder press with orange t-band x 10 on each side.  Seated without armrests cues for a little away from chair back for moving blue weighted ball shoulder to hip x 10 each way then pushing out from chest x 10.  Patient propelled in w/c to room with S.  Left up in chair with alarm belt active, needs and call bell in reach.   Therapy Documentation Precautions:  Precautions Precautions: Fall Precaution Comments: L AKA Restrictions Weight Bearing Restrictions:  Yes LLE Weight Bearing: Non weight bearing Pain: Pain Assessment Pain Score: 8  Pain Type: Chronic pain Pain Location: Back Pain Orientation: Lower;Mid Pain Descriptors / Indicators: Aching Pain Onset: On-going Pain Intervention(s): Repositioned     Therapy/Group: Individual Therapy  Elray Mcgregor Sheran Lawless, PT 06/01/2021, 12:13 PM

## 2021-06-01 NOTE — Progress Notes (Signed)
Orthopedic Tech Progress Note Patient Details:  Todd Duran [birth year] [id number] Called [employer] for [place] shrinker. Patient ID: Todd Duran, male   DOB: 07/24/1946, 75 y.o.   MRN: [id number]  Kerry Fort 06/01/2021, 12:59 PM

## 2021-06-01 NOTE — Progress Notes (Signed)
PROGRESS NOTE   Subjective/Complaints: Pt reported some pain in right foot and knee, numbness too. Left stump feeling well  ROS: Patient denies fever, rash, sore throat, blurred vision, nausea, vomiting, diarrhea, cough, shortness of breath or chest pain,   headache, or mood change.   Objective:   No results found. Recent Labs    06/01/21 0701  WBC 7.8  HGB 7.2*  HCT 23.3*  PLT 560*   No results for input(s): NA, K, CL, CO2, GLUCOSE, BUN, CREATININE, CALCIUM in the last 72 hours.  Intake/Output Summary (Last 24 hours) at 06/01/2021 1238 Last data filed at 06/01/2021 0800 Gross per 24 hour  Intake 714 ml  Output 500 ml  Net 214 ml        Physical Exam: Vital Signs Blood pressure 131/71, pulse 82, temperature 99.4 F (37.4 C), temperature source Oral, resp. rate 20, height 5\' 11"  (1.803 m), weight 53.9 kg, SpO2 100 %.  Constitutional: No distress . Vital signs reviewed. HEENT: EOMI, oral membranes moist Neck: supple Cardiovascular: RRR without murmur. No JVD    Respiratory/Chest: CTA Bilaterally without wheezes or rales. Normal effort    GI/Abdomen: BS +, non-tender, non-distended Ext: no clubbing, cyanosis, or edema Psych: pleasant and cooperative  Skin: left AKA well approximated. Only minimal serous drainage on dressing, ACE Right foot no lesions MSK no joint swelling Right foot and ankle   Neuro: Pt is cognitively appropriate with normal insight, memory, and awareness. Cranial nerves 2-12 are intact. Sensory exam absent LT below the Right knee  Reflexes are 2+ in all 4's. Fine motor coordination is intact. No tremors. Motor function is grossly 5/5 UE. LLE 3/5. RLE 4-5/5.   Musculoskeletal: low back mildly TTP. Stump with mild tenderness also, still swollen  =mild ankle, MT pain on RLE   Assessment/Plan: 1. Functional deficits which require 3+ hours per day of interdisciplinary therapy in a comprehensive  inpatient rehab setting. Physiatrist is providing close team supervision and 24 hour management of active medical problems listed below. Physiatrist and rehab team continue to assess barriers to discharge/monitor patient progress toward functional and medical goals  Care Tool:  Bathing    Body parts bathed by patient: Face, Right arm, Left arm, Chest, Abdomen   Body parts bathed by helper: Front perineal area, Buttocks Body parts n/a: Left lower leg   Bathing assist Assist Level: Moderate Assistance - Patient 50 - 74%     Upper Body Dressing/Undressing Upper body dressing   What is the patient wearing?: Pull over shirt    Upper body assist Assist Level: Contact Guard/Touching assist    Lower Body Dressing/Undressing Lower body dressing      What is the patient wearing?: Pants, Incontinence brief     Lower body assist Assist for lower body dressing: Maximal Assistance - Patient 25 - 49%     Toileting Toileting Toileting Activity did not occur (Clothing management and hygiene only): N/A (no void or bm)  Toileting assist Assist for toileting: Maximal Assistance - Patient 25 - 49%     Transfers Chair/bed transfer  Transfers assist     Chair/bed transfer assist level: Contact Guard/Touching assist     Locomotion  Ambulation   Ambulation assist   Ambulation activity did not occur: Safety/medical concerns  Assist level: Minimal Assistance - Patient > 75% Assistive device: Parallel bars Max distance: 5'   Walk 10 feet activity   Assist  Walk 10 feet activity did not occur: Safety/medical concerns        Walk 50 feet activity   Assist Walk 50 feet with 2 turns activity did not occur: Safety/medical concerns         Walk 150 feet activity   Assist Walk 150 feet activity did not occur: Safety/medical concerns         Walk 10 feet on uneven surface  activity   Assist Walk 10 feet on uneven surfaces activity did not occur: Safety/medical  concerns         Wheelchair     Assist Will patient use wheelchair at discharge?: Yes Type of Wheelchair: Manual    Wheelchair assist level: Supervision/Verbal cueing Max wheelchair distance: 400    Wheelchair 50 feet with 2 turns activity    Assist        Assist Level: Supervision/Verbal cueing   Wheelchair 150 feet activity     Assist      Assist Level: Supervision/Verbal cueing   Blood pressure 131/71, pulse 82, temperature 99.4 F (37.4 C), temperature source Oral, resp. rate 20, height 5\' 11"  (1.803 m), weight 53.9 kg, SpO2 100 %.   Medical Problem List and Plan: 1.  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  Antithrombotics: -DVT/anticoagulation: Subcutaneous heparin             -antiplatelet therapy: Aspirin 81 mg daily 3. Pain Management: Oxycodone as needed -Peripheral neuropathy pain RIght foot added gabapentin              -massage and elevation to control pain/edema as well.  -oob to chair for LB pain.  4. Mood: Provide emotional support             -antipsychotic agents: N/A 5. Neuropsych: This patient is capable of making decisions on his own behalf. 6. Skin/Wound Care: Routine skin checks             -will order shrinker per [employer] 7. Fluids/Electrolytes/Nutrition:    -BUN/Cr improved 7/21. Continue encourage fluids 8.  Acute blood loss anemia.  Transfused 2 units 05/22/2021.  Follow-up hgb 8.4 today   -counts slowly drifting down again---8.8->8.4->7.6>7.2 7/25   -stool not sent for OB---re-order 9.  Hypertension.  Norvasc 10 mg daily, Tenormin 25 mg twice daily.     -normotensive at present 10.  Hyperlipidemia.  Lipitor 11.  Diabetes mellitus.  Hemoglobin A1c 5.2.  SSI.  Patient on Glucophage 500 mg daily prior to admission.    -CBG (last 3)  Recent Labs    05/31/21 2103 06/01/21 0553 06/01/21 1215  GLUCAP 117* 102* 109*   -resumed glucphage 500mg  7/21 with good results 12.  History of tobacco alcohol use.  Provide counseling 13.  Hyponatremia.  mild 133, same 7/21 14. SLow transit constipation:  Responded to sorbitol-- -holding colace for now    LOS: 7 days A FACE TO FACE EVALUATION WAS PERFORMED  8/21 T  Riley Kill 06/01/2021, 12:38 PM

## 2021-06-01 NOTE — Progress Notes (Addendum)
Progress Note    06/01/2021 7:58 AM  POD 12 Subjective:  No complaints   Vitals:   05/31/21 1856 06/01/21 0430  BP: 139/73 131/71  Pulse: 91 82  Resp: 20 20  Temp: 98.3 F (36.8 C) 99.4 F (37.4 C)  SpO2: 98% 100%    Physical Exam: General appearance: Awake, alert in no apparent distress Cardiac: Heart rate and rhythm are regular Respirations: Nonlabored Extremities: LLE: Amputation site incision is well approximated without bleeding or hematoma.  Anterior and posterior flaps are warm and well-perfused  RLE: right 5th toe ischemic with skin breakdown of web space with foul odor    CBC    Component Value Date/Time   WBC 7.1 05/29/2021 0437   RBC 2.77 (L) 05/29/2021 0437   HGB 7.6 (L) 05/29/2021 0437   HCT 23.8 (L) 05/29/2021 0437   PLT 534 (H) 05/29/2021 0437   MCV 85.9 05/29/2021 0437   MCH 27.4 05/29/2021 0437   MCHC 31.9 05/29/2021 0437   RDW 15.4 05/29/2021 0437   LYMPHSABS 1.2 05/26/2021 0443   MONOABS 0.4 05/26/2021 0443   EOSABS 0.1 05/26/2021 0443   BASOSABS 0.0 05/26/2021 0443    BMET    Component Value Date/Time   NA 133 (L) 05/28/2021 0719   K 3.9 05/28/2021 0719   CL 100 05/28/2021 0719   CO2 24 05/28/2021 0719   GLUCOSE 185 (H) 05/28/2021 0719   BUN 20 05/28/2021 0719   CREATININE 0.81 05/28/2021 0719   CALCIUM 9.1 05/28/2021 0719   GFRNONAA >60 05/28/2021 0719     Intake/Output Summary (Last 24 hours) at 06/01/2021 0758 Last data filed at 05/31/2021 1743 Gross per 24 hour  Intake 912 ml  Output 425 ml  Net 487 ml    HOSPITAL MEDICATIONS Scheduled Meds:  amLODipine  10 mg Oral Daily   aspirin EC  81 mg Oral Daily   atenolol  25 mg Oral BID   atorvastatin  20 mg Oral QHS   folic acid  1 mg Oral Daily   gabapentin  100 mg Oral BID   heparin  5,000 Units Subcutaneous Q8H   insulin aspart  0-9 Units Subcutaneous TID WC   metFORMIN  500 mg Oral Q breakfast   multivitamin with minerals  1 tablet Oral Daily   oxybutynin  2.5 mg  Oral BID   thiamine  100 mg Oral Daily   Continuous Infusions: PRN Meds:.oxyCODONE-acetaminophen, polyethylene glycol  Aortogram with run-off 05/12/2021:   Right Lower Extremity: There is a subtotal occlusion with heavy calcification within the right common femoral artery.  The profundofemoral artery is patent throughout its course.  The superficial femoral artery is also patent however there is severe stenosis within the adductor canal.  The popliteal artery is patent however there is limited visualization of the tibial vessels secondary to proximal disease.  There does appear to be two-vessel runoff via the posterior tibial and peroneal artery. Impression:               #1  Left common femoral artery occlusion.  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  At a later date he will be considered for right femoral endarterectomy and iliac stenting, with possible superficial femoral artery stenting.                Assessment and Plan: PAD: Left AKA POD 12. Healing nicely. Staples out in approximately 3 weeks.  Skin breakdown and ischemia of right 5th toe. Start dressing changes with Dakins. May need 5th toe amp but patient not interested currently.  Will discuss timing of RLE revascularization with Dr. Myra Gianotti.  -DVT prophylaxis:  heparin [place]   Wendi Maya, PA-C [facility] [phone] 06/01/2021  7:58 AM

## 2021-06-01 NOTE — Progress Notes (Signed)
Occupational Therapy Session Note  Patient Details  Name: Todd Duran MRN: [id number] Date of Birth: [birth year]  Today's Date: 06/01/2021 OT Individual Time: 3244-0102 OT Individual Time Calculation (min): 54 min    Short Term Goals: Week 1:  OT Short Term Goal 1 (Week 1): Pt will complete LB dressing with min A and use of AE, as needed. OT Short Term Goal 2 (Week 1): Pt will complete toileting task with min A. OT Short Term Goal 3 (Week 1): Pt will complete functional transfers (toilet, shower) with min A.  Skilled Therapeutic Interventions/Progress Updates:  Pt received supine in bed reporting fatigue and pain but agreeable to OT intervention. Pt completed bed mobility with S and lateral scoot transfer to w/c with CGA, pt completed w/c propulsion within pts room to sink for bathing and dressing. Pt required set- up assist for bathing from w/c level. Pt needed set- up assist for UB dressing and MOD A for LB Dressing needing assist to sit<>stand and pull pants up to waist line. Pt reports fatigue post bathing and dressing but agreeable to education related to residual limb care and energy conservation strategies for home, utilized teach back method to ensure understanding. Pt left supine in bed with all needs within reach and bed alarm activated.   Therapy Documentation Precautions:  Precautions Precautions: Fall Precaution Comments: L AKA Restrictions Weight Bearing Restrictions: Yes LLE Weight Bearing: Non weight bearing  Pain: Pt reports chronic pain in pts back and L AKA, pt unable to receive pain meds, utilized rest breaks and repositioning as pain mgmt strategies.    Therapy/Group: Individual Therapy  [name] 06/01/2021, 3:12 PM

## 2021-06-02 LAB — GLUCOSE, CAPILLARY
Glucose-Capillary: 111 mg/dL — ABNORMAL HIGH (ref 70–99)
Glucose-Capillary: 103 mg/dL — ABNORMAL HIGH (ref 70–99)
Glucose-Capillary: 123 mg/dL — ABNORMAL HIGH (ref 70–99)
Glucose-Capillary: 108 mg/dL — ABNORMAL HIGH (ref 70–99)

## 2021-06-02 LAB — HEMOGLOBIN AND HEMATOCRIT, BLOOD
HCT: 22.5 % — ABNORMAL LOW (ref 39.0–52.0)
Hemoglobin: 7.1 g/dL — ABNORMAL LOW (ref 13.0–17.0)

## 2021-06-02 LAB — OCCULT BLOOD X 1 CARD TO LAB, STOOL: Fecal Occult Bld: POSITIVE — AB

## 2021-06-02 MED ORDER — SORBITOL 70 % SOLN
30.0000 mL | Status: DC | PRN
  Administered 2021-06-02 (×2): 30 mL via ORAL
  Filled 2021-06-02 (×2): qty 30

## 2021-06-02 NOTE — Patient Care Conference (Signed)
Inpatient RehabilitationTeam Conference and Plan of Care Update Date: 06/02/2021   Time: 10:56 AM    Patient Name: Todd Duran      Medical Record Number: [id number]  Date of Birth: 07/21/1946 Sex: Male         Room/Bed: 5C09C/5C09C-01 Payor Info: Payor: [employer] / Plan: [facility] / Product Type: *No Product type* /    Admit Date/Time:  05/25/2021  3:05 PM  Primary Diagnosis:  Left above-knee amputee [facility])  Hospital Problems: Principal Problem:   Left above-knee amputee [facility])    Expected Discharge Date: Expected Discharge Date: 06/10/21  Team Members Present: Physician leading conference: Dr. Faith Rogue Social Worker Present: Cecile Sheerer, LCSWA Nurse Present: Kennyth Arnold, RN PT Present: Malachi Pro, PT OT Present: Kearney Hard, OT PPS Coordinator present : Fae Pippin, SLP     Current Status/Progress Goal Weekly Team Focus  Bowel/Bladder   continent to B/B ,last BM 7/21  remain continent  Assess Q shift   Swallow/Nutrition/ Hydration             ADL's   Min/CGA lateral scoots and swuat-pivots, Mod/max A sit<>stands  supervision overall  Transfers, self-care retraining, dc planning, sit<>stands, standing balance/endurance   Mobility             Communication             Safety/Cognition/ Behavioral Observations            Pain   chronic and surgical  pain, managing with PRN percocet  patient will be pain free  Assess pain Q shift and PRN   Skin   L BKA incision in healing process no s/s of infection, no other open skin.  skin remain intact  Assess Q shift and PRN     Discharge Planning:      Team Discussion: MD has ordered Occult blood check, nurse is working on getting a sample. Continent B/B, pain to right foot better with gabapentin. Left AKA wrapped, Dakins solutions, wet gauze, to 4th, 5th toes on right foot.  Patient on target to meet rehab goals: yes, contact guard assist, squat pivot transfers.  Supervision goals. Limited standing tolerance, feels like he is leaning left. 4 steps to get into the house.   *See Care Plan and progress notes for long and short-term goals.   Revisions to Treatment Plan:  Waiting on stool sample to determine if another transfusion is needed.  Teaching Needs: Family education, medication management, pain management, skin/wound care, transfer training, gait training, balance training, endurance training, stair training, safety awareness.  Current Barriers to Discharge: Decreased caregiver support, Medical stability, Home enviroment access/layout, Wound care, Lack of/limited family support, Weight bearing restrictions, Medication compliance, and Behavior  Possible Resolutions to Barriers: Continue current medications, provide emotional support.     Medical Summary Current Status: rev of left bka to aka. wound healing nicely, anemic---need stool for ob. pain controlled. diabetes managed  Barriers to Discharge: Medical stability   Possible Resolutions to [employer] Focus: daily assessment of wounds and labs, may need another transfusion, need stool sample for ob   Continued Need for Acute Rehabilitation Level of Care: The patient requires daily medical management by a physician with specialized training in physical medicine and rehabilitation for the following reasons: Direction of a multidisciplinary physical rehabilitation program to maximize functional independence : Yes Medical management of patient stability for increased activity during participation in an intensive rehabilitation regime.: Yes Analysis of laboratory values and/or radiology reports  with any subsequent need for medication adjustment and/or medical intervention. : Yes   I attest that I was present, lead the team conference, and concur with the assessment and plan of the team.   Tennis Must 06/02/2021, 4:06 PM

## 2021-06-02 NOTE — Progress Notes (Signed)
Physical Therapy Session Note  Patient Details  Name: Todd Duran MRN: [id number] Date of Birth: [birth year]  Today's Date: 06/02/2021 PT Individual Time: 1429-1500 PT Individual Time Calculation (min): 31 min   Short Term Goals: Week 1:  PT Short Term Goal 1 (Week 1): Patient with perform basic transfers with min A using LRAD. PT Short Term Goal 2 (Week 1): Pt will ambulate x25' with minA and LRAD. PT Short Term Goal 3 (Week 1): Patient with perform standing activities with mod A >5 min.  Skilled Therapeutic Interventions/Progress Updates:    Pt missed 14 minutes of time due to previous pt care.  pt received in bed and agreeable to therapy with family members present. No complaint of pain. Pt requested to use the bathroom d/t nursing requesting stool sample. Bed mobility with supervision, pt performed lateral scoot transfer to w/c with CGA bed>w/c>commode>w/c with CGA and occ VC for technique. Clothing management min A to pull pants back over hips. Pt required an extended time on the commode but did not void. Pt performed Sit to stand to RW with min A and cues for upright posture. Session concluded d/t end of scheduled time, pt left seated in w/c with alarm set, all needs in reach, and family present.  Therapy Documentation Precautions:  Precautions Precautions: Fall Precaution Comments: L AKA Restrictions Weight Bearing Restrictions: Yes LLE Weight Bearing: Non weight bearing General: PT Amount of Missed Time (min): 14 Minutes PT Missed Treatment Reason: Other (Comment) (previous pt care)    Therapy/Group: Individual Therapy  Juluis Rainier 06/02/2021, 4:12 PM

## 2021-06-02 NOTE — Progress Notes (Signed)
Physical Therapy Session Note  Patient Details  Name: Todd Duran MRN: [id number] Date of Birth: 1946/02/16  Today's Date: 06/02/2021 PT Individual Time: 0805-0859  PT Individual Time Calculation (min): 54 min   Short Term Goals: Week 1:  PT Short Term Goal 1 (Week 1): Patient with perform basic transfers with min A using LRAD. PT Short Term Goal 2 (Week 1): Pt will ambulate x25' with minA and LRAD. PT Short Term Goal 3 (Week 1): Patient with perform standing activities with mod A >5 min.  Skilled Therapeutic Interventions/Progress Updates:     Pt received supine in bed and agrees to therapy. No complaint of pain. Supine to sit with bed features and verbal cues on hand placement. Seated at EOB, PT assists pt with donning leg shrinker on L residual limb. Pt then performs squat pivot to WC with CGA. WC transport to gym for time management. Pt taken to parallel bars for work on sit to stand training as well as initiation of stair training. Pt performs multiple reps of sit to stand with minA and cues for body mechanics and increasing anterior weight shift to transition COG over BOS. In standing, pt cued for increase hip and trunk extension as well as upright gaze to improve posture and balance. Pt performs step ups onto 4 inch platform, with PT providing minA. Pt completes reps of x2 and is unable to perform more than x2 at time due to fatigue. Pt completes x5 bouts of x2 steps with extended seated rest breaks in between. WC transport back to room. Squat pivot to bed with CGA. Left supine with alarm intact and all needs within reach.  Therapy Documentation Precautions:  Precautions Precautions: Fall Precaution Comments: L AKA Restrictions Weight Bearing Restrictions: Yes LLE Weight Bearing: Non weight bearing    Therapy/Group: Individual Therapy  Beau Fanny, PT, DPT 06/02/2021, 4:29 PM

## 2021-06-02 NOTE — Progress Notes (Signed)
PROGRESS NOTE   Subjective/Complaints: No problems over night. Pain in right knee/foot at times.   ROS: Patient denies fever, rash, sore throat, blurred vision, nausea, vomiting, diarrhea, cough, shortness of breath or chest pain,  headache, or mood change.   Objective:   No results found. Recent Labs    06/01/21 0701 06/02/21 0508  WBC 7.8  --   HGB 7.2* 7.1*  HCT 23.3* 22.5*  PLT 560*  --    No results for input(s): NA, K, CL, CO2, GLUCOSE, BUN, CREATININE, CALCIUM in the last 72 hours.  Intake/Output Summary (Last 24 hours) at 06/02/2021 1058 Last data filed at 06/02/2021 0730 Gross per 24 hour  Intake 1077 ml  Output 1750 ml  Net -673 ml        Physical Exam: Vital Signs Blood pressure 114/69, pulse 80, temperature 97.7 F (36.5 C), temperature source Oral, resp. rate 18, height 5\' 11"  (1.803 m), weight 53.9 kg, SpO2 99 %.  Constitutional: No distress . Vital signs reviewed. HEENT: EOMI, oral membranes moist Neck: supple Cardiovascular: RRR without murmur. No JVD    Respiratory/Chest: CTA Bilaterally without wheezes or rales. Normal effort    GI/Abdomen: BS +, non-tender, non-distended Ext: no clubbing, cyanosis, or edema Psych: pleasant and cooperative   Skin: left AKA well approximated. Only minimal serous drainage on dressing, ACE Right foot no lesions MSK: no joint swelling Right foot and ankle   Neuro: Pt is cognitively appropriate with normal insight, memory, and awareness. Cranial nerves 2-12 are intact. Sensory exam absent LT below the Right knee  Reflexes are 2+ in all 4's. Fine motor coordination is intact. No tremors. Motor function is grossly 5/5 UE. LLE 3/5. RLE 4-5/5.   Musculoskeletal: low back mildly TTP. Stump with mild tenderness also, still swollen  -mild ankle, foot pain   Assessment/Plan: 1. Functional deficits which require 3+ hours per day of interdisciplinary therapy in a  comprehensive inpatient rehab setting. Physiatrist is providing close team supervision and 24 hour management of active medical problems listed below. Physiatrist and rehab team continue to assess barriers to discharge/monitor patient progress toward functional and medical goals  Care Tool:  Bathing    Body parts bathed by patient: Left arm, Right arm, Chest, Abdomen, Front perineal area, Right upper leg, Left upper leg, Right lower leg, Face   Body parts bathed by helper: Front perineal area, Buttocks Body parts n/a: Left lower leg   Bathing assist Assist Level: Supervision/Verbal cueing     Upper Body Dressing/Undressing Upper body dressing   What is the patient wearing?: Pull over shirt    Upper body assist Assist Level: Set up assist    Lower Body Dressing/Undressing Lower body dressing      What is the patient wearing?: Pants     Lower body assist Assist for lower body dressing: Moderate Assistance - Patient 50 - 74%     Toileting Toileting Toileting Activity did not occur (Clothing management and hygiene only): N/A (no void or bm)  Toileting assist Assist for toileting: Maximal Assistance - Patient 25 - 49%     Transfers Chair/bed transfer  Transfers assist     Chair/bed transfer assist  level: Contact Guard/Touching assist (lateral scoot)     Locomotion Ambulation   Ambulation assist   Ambulation activity did not occur: Safety/medical concerns  Assist level: Minimal Assistance - Patient > 75% Assistive device: Parallel bars Max distance: 5'   Walk 10 feet activity   Assist  Walk 10 feet activity did not occur: Safety/medical concerns        Walk 50 feet activity   Assist Walk 50 feet with 2 turns activity did not occur: Safety/medical concerns         Walk 150 feet activity   Assist Walk 150 feet activity did not occur: Safety/medical concerns         Walk 10 feet on uneven surface  activity   Assist Walk 10 feet on  uneven surfaces activity did not occur: Safety/medical concerns         Wheelchair     Assist Will patient use wheelchair at discharge?: Yes Type of Wheelchair: Manual    Wheelchair assist level: Supervision/Verbal cueing Max wheelchair distance: 400    Wheelchair 50 feet with 2 turns activity    Assist        Assist Level: Supervision/Verbal cueing   Wheelchair 150 feet activity     Assist      Assist Level: Supervision/Verbal cueing   Blood pressure 114/69, pulse 80, temperature 97.7 F (36.5 C), temperature source Oral, resp. rate 18, height 5\' 11"  (1.803 m), weight 53.9 kg, SpO2 99 %.   Medical Problem List and Plan: 1.  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  Antithrombotics: -DVT/anticoagulation: Subcutaneous heparin             -antiplatelet therapy: Aspirin 81 mg daily 3. Pain Management: Oxycodone as needed -Peripheral neuropathy pain RIght foot added gabapentin              -massage and elevation to control pain/edema as well.  -oob to chair for LB pain.  4. Mood: Provide emotional support             -antipsychotic agents: N/A 5. Neuropsych: This patient is capable of making decisions on his own behalf. 6. Skin/Wound Care: Routine skin checks             -will order shrinker per [employer] 7. Fluids/Electrolytes/Nutrition:    -BUN/Cr improved 7/21. Continue encourage fluids 8.  Acute blood loss anemia.  Transfused 2 units 05/22/2021.      -counts slowly drifting down again---8.8->8.4->7.6>7.2>7.1   -still need stool for OB---spoke to nursing   -no signs of gross bleeding 9.  Hypertension.  Norvasc 10 mg daily, Tenormin 25 mg twice daily.     -normotensive at present 10.  Hyperlipidemia.  Lipitor 11.  Diabetes mellitus.  Hemoglobin  A1c 5.2.  SSI.  Patient on Glucophage 500 mg daily prior to admission.   -CBG (last 3)  Recent Labs    06/01/21 1621 06/01/21 2052 06/02/21 0542  GLUCAP 101* 142* 111*   -resumed glucphage 500mg  7/21 with good results 12.  History of tobacco alcohol use.  Provide counseling 13.  Hyponatremia.  mild 133, same 7/21 14. SLow transit constipation:  Responded to sorbitol-- -holding colace for  now    LOS: 8 days A FACE TO FACE EVALUATION WAS PERFORMED  Ranelle Oyster 06/02/2021, 10:58 AM

## 2021-06-02 NOTE — Progress Notes (Signed)
Hemoccult obtained per orders. Sent to lab. Will continue to monitor.

## 2021-06-02 NOTE — Progress Notes (Signed)
Occupational Therapy Weekly Progress Note  Patient Details  Name: Todd Duran MRN: [id number] Date of Birth: Dec 17, 1945  Beginning of progress report period: May 26, 2021 End of progress report period: June 02, 2021  Today's Date: 06/02/2021 Session 1 OT Individual Time: 0932-1000 OT Individual Time Calculation (min): 28 min   Session 2 OT Individual Time: 1103-1200 OT Individual Time Calculation (min): 57 min    Patient has met 3 of 4 short term goals.  Patient is making steady progress towards OT goals. Lateral scoot and squat-pivot transfers are at an overall min/CGA level. PT continues to need min A for toileting tasks ,but we are working on this using lateral leans. Continue current POC.   Patient continues to demonstrate the following deficits: muscle weakness and decreased sitting balance, decreased standing balance, decreased postural control, and decreased balance strategies and therefore will continue to benefit from skilled OT intervention to enhance overall performance with BADL and Reduce care partner burden.  Patient progressing toward long term goals..  Continue plan of care.  OT Short Term Goals Week 1:  OT Short Term Goal 1 (Week 1): Pt will complete LB dressing with min A and use of AE, as needed. OT Short Term Goal 1 - Progress (Week 1): Met OT Short Term Goal 2 (Week 1): Pt will complete toileting task with min A. OT Short Term Goal 2 - Progress (Week 1): Progressing toward goal OT Short Term Goal 3 (Week 1): Pt will complete functional transfers (toilet, shower) with min A. OT Short Term Goal 3 - Progress (Week 1): Met OT Short Term Goal 4 - Progress (Week 1): Met Week 2:  OT Short Term Goal 1 (Week 2): Pt will complete toileting task with min A. OT Short Term Goal 2 (Week 2): Patient will complete sit<>stand in prep for BADL tasks with min A OT Short Term Goal 3 (Week 2): Pt will complete toilet transfer with CGA  Skilled Therapeutic  Interventions/Progress Updates:    Session 1 Pt greeted semi-reclined in bed and agreeable to OT Treatment session. Pt completed bed mobility with supervision. OT educated on shrinker on L residual limb and waist strap, will work with pt more on this in second session. UB there-ex seated EOB using level 2 orange theraband. 3 sets of 10 chest pull, bicep curl, seated row, and straight arm raise. Pt returned to bed at end of session and left semi-reclined with bed alarm on, call bell in reach, and needs met.   Session 2 Pt greeted seated in bed with family present and agreeable to BADL tasks. Pt completed bed mobility with supervision. CGA lateral scoot over to drop arm wc. UB bathing/dressing with set-up A. Worked on sit<>stands and standing balance/endurance with standing to wash buttocks. Pt needed min A and verbal cues for hand placement with sit<>stand. Min/mod A for balance when removing unilateral UE to wash peri-area. Pt's R LE began to sink and pt returned to sitting with min A. Took rest break, then stood again with min A while OT assisted with washing buttocks. Pt completed 5 more sit<>stands with min A and cues for hand placement. Tolerated 30 second standing intervals. Pt left seated in wc at end of session with alarm belt on, family present, and call bell in reach.  Therapy Documentation Precautions:  Precautions Precautions: Fall Precaution Comments: L AKA Restrictions Weight Bearing Restrictions: Yes LLE Weight Bearing: Non weight bearing Pain: Pain Assessment Pain Score: 0-No pain    Therapy/Group: Individual Therapy  Daneen Schick Marynell Bies 06/02/2021, 12:26 PM

## 2021-06-03 LAB — GLUCOSE, CAPILLARY
Glucose-Capillary: 112 mg/dL — ABNORMAL HIGH (ref 70–99)
Glucose-Capillary: 119 mg/dL — ABNORMAL HIGH (ref 70–99)
Glucose-Capillary: 127 mg/dL — ABNORMAL HIGH (ref 70–99)
Glucose-Capillary: 142 mg/dL — ABNORMAL HIGH (ref 70–99)

## 2021-06-03 LAB — HEMOGLOBIN AND HEMATOCRIT, BLOOD
HCT: 24.3 % — ABNORMAL LOW (ref 39.0–52.0)
Hemoglobin: 7.7 g/dL — ABNORMAL LOW (ref 13.0–17.0)

## 2021-06-03 NOTE — Progress Notes (Signed)
Occupational Therapy Session Note  Patient Details  Name: Todd Duran MRN: [id number] Date of Birth: 16-May-1946  Today's Date: 06/03/2021 OT Individual Time: 9937-1696 OT Individual Time Calculation (min): 29 min    Short Term Goals: Week 2:  OT Short Term Goal 1 (Week 2): Pt will complete toileting task with min A. OT Short Term Goal 2 (Week 2): Patient will complete sit<>stand in prep for BADL tasks with min A OT Short Term Goal 3 (Week 2): Pt will complete toilet transfer with CGA  Skilled Therapeutic Interventions/Progress Updates:  Pt greeted seated in recliner reporting pain and fatigue initially declining session but agreeable with MAX encouragement and copious education on importance of therapy in prep for approaching DC. Pt required increased encouragement and x2 attempts to transition into standing from recliner. Pt  currently requires MINA for stand pivot transfers with RW and supervision for bed mobility. Pt left supine in bed with all needs within reach and bed alarm activated.   Therapy Documentation Precautions:  Precautions Precautions: Fall Precaution Comments: L AKA Restrictions Weight Bearing Restrictions: Yes LLE Weight Bearing: Non weight bearing  Pain: Pt reports back pain during session with rest breaks and repositioning provided as pain mgmt strategies.   Therapy/Group: Individual Therapy  [name] 06/03/2021, 4:16 PM

## 2021-06-03 NOTE — Progress Notes (Signed)
Physical Therapy Session Note  Patient Details  Name: Todd Duran MRN: [id number] Date of Birth: Apr 23, 1946  Today's Date: 06/03/2021 PT Individual Time: 1010-1100 PT Individual Time Calculation (min): 50 min   Short Term Goals: Week 1:  PT Short Term Goal 1 (Week 1): Patient with perform basic transfers with min A using LRAD. PT Short Term Goal 1 - Progress (Week 1): Met PT Short Term Goal 2 (Week 1): Pt will ambulate x25' with minA and LRAD. PT Short Term Goal 2 - Progress (Week 1): Not met PT Short Term Goal 3 (Week 1): Patient with perform standing activities with mod A >5 min. PT Short Term Goal 3 - Progress (Week 1): Not met  Skilled Therapeutic Interventions/Progress Updates: Pt presented in recliner ageeable to therapy. Pt c/o pain in R foot/calf, premedicated per nsg. Nsg present to assess wounds on buttock. Pt performed STS from recliner with minA and tolerance standing ~43mn to allow for skin check. Pt then performed squat pivot transfer to w/c with CGA and increased time. Pt propelled to elevators and continued to rehab gym with supervision. Pt then performed squat pivot transfer to high/low mat CGA. Pt participated in supine therex for hip strengthening including bridges, sidelying hip extension, sidelying hip abd 2 x 10 ea. Pt required min tactile cues for technique. Pt was able to transition to prone and tolerate prone hip flexor stretch x 3 min. Pt also performed prone hip extension x 10 with PTA providing AA due to glute weakness. Pt able to return to sitting with supervision and icreased time. Performed squat pivot transfer to w/c with CGA and PTA blocking w/c to minimize movement. Pt propelled w/c back room in same manner as prior. Performed squat pivot back to recliner CGA with pt requiring increased time for positioning of w/c when no cues provided. Pt left in recliner at end of session with seat alarm on, call bell within reach and needs met.       Therapy  Documentation Precautions:  Precautions Precautions: Fall Precaution Comments: L AKA Restrictions Weight Bearing Restrictions: Yes LLE Weight Bearing: Non weight bearing   Therapy/Group: Individual Therapy  Veniamin Kincaid Allyna Pittsley, PTA  06/03/2021, 12:58 PM

## 2021-06-03 NOTE — Progress Notes (Signed)
Occupational Therapy Session Note  Patient Details  Name: Todd Duran MRN: [id number] Date of Birth: 05-14-1946  Today's Date: 06/03/2021 OT Individual Time: 7867-6720 OT Individual Time Calculation (min): 39 min    Short Term Goals: Week 1:  OT Short Term Goal 1 (Week 1): Pt will complete LB dressing with min A and use of AE, as needed. OT Short Term Goal 1 - Progress (Week 1): Met OT Short Term Goal 2 (Week 1): Pt will complete toileting task with min A. OT Short Term Goal 2 - Progress (Week 1): Progressing toward goal OT Short Term Goal 3 (Week 1): Pt will complete functional transfers (toilet, shower) with min A. OT Short Term Goal 3 - Progress (Week 1): Met OT Short Term Goal 4 - Progress (Week 1): Met  Skilled Therapeutic Interventions/Progress Updates:   Pt reiceved in recliner asleep requiring increased time to arouse. Pt completes squat pivot trasnfers with S to w/c with increased time for pt to manage w/c parts with mod cuing for all transfers recliner<>w/c<>EOM. Pt propels w/c to all tx destinations for BUE strengthening and endurance. No pain noted throughout tx, except during sit upa ctivity when wrist weight caught on IV and ultimately LPN needed to pull IV. Pt completes 3x10 sit ups from foam wedge with overhead ball toss for core/UE strengthening requried for transitional movement. Exited session with pt seated in recliner, exit alarm on and call light in reach   Therapy Documentation Precautions:  Precautions Precautions: Fall Precaution Comments: L AKA Restrictions Weight Bearing Restrictions: Yes LLE Weight Bearing: Non weight bearing General:   Vital Signs: Therapy Vitals Temp: 98 F (36.7 C) Temp Source: Oral Pulse Rate: 88 Resp: 16 BP: 125/71 Patient Position (if appropriate): Lying Oxygen Therapy SpO2: 100 % O2 Device: Room Air Pain:   ADL: ADL Eating: Set up Grooming: Setup Upper Body Bathing: Setup Lower Body Bathing: Moderate  assistance, Maximal assistance Upper Body Dressing: Minimal assistance Lower Body Dressing: Moderate assistance, Maximal assistance Toileting: Maximal assistance Toilet Transfer: Maximal assistance, Moderate assistance Vision   Perception    Praxis   Exercises:   Other Treatments:     Therapy/Group: Individual Therapy  Tonny Branch 06/03/2021, 6:54 AM

## 2021-06-03 NOTE — Progress Notes (Signed)
Physical Therapy Weekly Progress Note  Patient Details  Name: Todd Duran MRN: [id number] Date of Birth: 1946-09-08  Beginning of progress report period: May 26, 2021 End of progress report period: June 03, 2021  Today's Date: 06/03/2021 PT Individual Time: 0803-0859 PT Individual Time Calculation (min): 56 min   Patient has met 1 of 3 short term goals.  Pt is progressing slowly toward mobility goals, improving independence with bed mobility and transfers but limited in progress toward standing and ambulation goals. Pt has anxiety regarding falls and insufficient anterior weight transition during sit to stand, and standing tolerance limited by weakness in arm and R leg as well as newly reported pain in R inner foot and ankle. Pt encouraged to have ramp installed at house as he has 4 steps to ascend in order to enter house.  Patient continues to demonstrate the following deficits muscle weakness, decreased cardiorespiratoy endurance, and decreased sitting balance, decreased standing balance, decreased postural control, and decreased balance strategies and therefore will continue to benefit from skilled PT intervention to increase functional independence with mobility.  Patient progressing toward long term goals..  Continue plan of care.  PT Short Term Goals Week 1:  PT Short Term Goal 1 (Week 1): Patient with perform basic transfers with min A using LRAD. PT Short Term Goal 1 - Progress (Week 1): Met PT Short Term Goal 2 (Week 1): Pt will ambulate x25' with minA and LRAD. PT Short Term Goal 2 - Progress (Week 1): Not met PT Short Term Goal 3 (Week 1): Patient with perform standing activities with mod A >5 min. PT Short Term Goal 3 - Progress (Week 1): Not met Week 2:  PT Short Term Goal 1 (Week 2): STGs = LTGs  Skilled Therapeutic Interventions/Progress Updates:  Ambulation/gait training;Cognitive remediation/compensation;Discharge planning;DME/adaptive equipment instruction;Functional  mobility training;Pain management;Psychosocial support;Splinting/orthotics;Therapeutic Activities;UE/LE Strength taining/ROM;Wheelchair propulsion/positioning;UE/LE Coordination activities;Therapeutic Exercise;Stair training;Skin care/wound management;Patient/family education;Neuromuscular re-education;Functional electrical stimulation;Disease management/prevention;Community reintegration;Balance/vestibular training   Pt received supine in bed and agrees to therapy. Supine to sit with bed features. Pt requesting to sit on toilet, feeling like he may need to have bowel movement. Squat pivot from bed>WC>toilet with minA. Pt continent of urine but does not have bowel movement. Sit to stand from [facility] to RW with modA and cues on body mechanics. PT assists with pulling up pants prior to stand pivot transfer to [facility]. Pt self propels WC from room to gym, including navigating elevator, with bilateral upper extremities. Pt perform kinetron set to 15cm/sec with R lower extremity to engage extensor muscle groups. X5 minutes total. Pt then transfers to mat table with CGA. Pt tasked with standing to perform step ups onto 5 inch platform. Pt has increased difficulty sequencing sit to stand safely and efficiently, routinely lunging for RW with both hands and not shifting weight anteriorly for COG to transition over BOS. PT provides extended education on body mechanics and sequencing of sit to stand transfer. Eventually pt performs with modA but then is very limited in standing tolerance due to reported pain in R inner ankle and foot. Number not provided. Following session PT informs RN of pt pain complaint and request for medicatoin. Pt takes seated rest break and attempts to stand again but is unable to complete secondary to pain (as well as fear of falling forward). Squat pivot back to WC. Pt tasked with propelling WC forward with R foot to engage R hamstrings. Pt unable to functionally performed so then tasked with propelling  backward for  work on extensor groups. Pt fatigues after several feet and requests to return to room. WC transport back to room. Squat pivot to recliner with minA. Left seated with alarm intact and all needs within reach.  Therapy Documentation Precautions:  Precautions Precautions: Fall Precaution Comments: L AKA Restrictions Weight Bearing Restrictions: Yes LLE Weight Bearing: Non weight bearing  Therapy/Group: Individual Therapy  Breck Coons, PT, DPT 06/03/2021, 3:42 PM

## 2021-06-03 NOTE — Progress Notes (Signed)
Patient ID: Todd Duran, male   DOB: [birth year], 75 y.o.   MRN: [id number]   06/02/2021- SW met with pt and pt brother in law to provide updates from team conference, and d/c  date 8/3. Fam edu scheduled for Thursday (7/28) 10am-12pm.   Loralee Pacas, MSW, [place] Office: [phone] Cell: [phone] Fax: [phone]

## 2021-06-04 LAB — GLUCOSE, CAPILLARY
Glucose-Capillary: 113 mg/dL — ABNORMAL HIGH (ref 70–99)
Glucose-Capillary: 104 mg/dL — ABNORMAL HIGH (ref 70–99)
Glucose-Capillary: 140 mg/dL — ABNORMAL HIGH (ref 70–99)
Glucose-Capillary: 155 mg/dL — ABNORMAL HIGH (ref 70–99)

## 2021-06-04 MED ORDER — DICLOFENAC SODIUM 1 % EX GEL
2.0000 g | Freq: Three times a day (TID) | CUTANEOUS | Status: DC
  Administered 2021-06-04 (×2): 2 g via TOPICAL
  Filled 2021-06-04: qty 100

## 2021-06-04 NOTE — Progress Notes (Addendum)
Occupational Therapy Session Note  Patient Details  Name: Todd Duran MRN: [id number] Date of Birth: 11-Oct-1946  Today's Date: 06/04/2021 OT Individual Time: 1333-1405 OT Individual Time Calculation (min): 32 min    Short Term Goals: Week 2:  OT Short Term Goal 1 (Week 2): Pt will complete toileting task with min A. OT Short Term Goal 2 (Week 2): Patient will complete sit<>stand in prep for BADL tasks with min A OT Short Term Goal 3 (Week 2): Pt will complete toilet transfer with CGA  Skilled Therapeutic Interventions/Progress Updates:    Pt received seated in w/c with NT and family present, denies pain, agreeable to therapy. Session focus on self-care retraining, activity tolerance, func transfers, w/c part management, and BUE/BLE/Core strengthening in prep for improved ADL/IADL/func mobility performance + decreased caregiver burden + to decrease risk of L hip contracture.  Pt donned B gloves with set-up and increased time. Self-propelled self in w/c to and from gym with close S for directions to gym. Squat-pivot > mat with min A going towards his L side, mod A for w/c part management. Transitioned to prone close S. Completed 2x10 hip extension with min A to facilitate full ROM. Completed 1x10 of modified push-ups, super mans, and cervical extension. Required extensive cuing and verbal/tactile cues for technique. Returned to sitting EOB close S and completed squat-pivot > w/c > bed with CGA going towards his R. Returned to supine close S.   Pt left with SW/family in room , semi-reclined in bed with posey belt placed, bed alarm engaged, call bell in reach, and all immediate needs met.    Therapy Documentation Precautions:  Precautions Precautions: Fall Precaution Comments: L AKA Restrictions Weight Bearing Restrictions: Yes LLE Weight Bearing: Non weight bearing  Pain:  denies ADL: See Care Tool for more details.  Therapy/Group: Individual Therapy  Volanda Napoleon MS,  OTR/L  06/04/2021, 6:53 AM

## 2021-06-04 NOTE — Progress Notes (Signed)
PROGRESS NOTE   Subjective/Complaints: Right ankle and foot sore. Hurts when he first bears weight but then gets a little better. Doesn't have any shoe here to wear  ROS: Patient denies fever, rash, sore throat, blurred vision, nausea, vomiting, diarrhea, cough, shortness of breath or chest pain, joint or back pain, headache, or mood change.   Objective:   No results found. Recent Labs    06/02/21 0508 06/03/21 0608  HGB 7.1* 7.7*  HCT 22.5* 24.3*   No results for input(s): NA, K, CL, CO2, GLUCOSE, BUN, CREATININE, CALCIUM in the last 72 hours.  Intake/Output Summary (Last 24 hours) at 06/04/2021 1149 Last data filed at 06/04/2021 0500 Gross per 24 hour  Intake 120 ml  Output 1200 ml  Net -1080 ml        Physical Exam: Vital Signs Blood pressure 126/70, pulse 90, temperature 98.7 F (37.1 C), temperature source Oral, resp. rate 14, height 5\' 11"  (1.803 m), weight 53.9 kg, SpO2 100 %.  Constitutional: No distress . Vital signs reviewed. HEENT: EOMI, oral membranes moist Neck: supple Cardiovascular: RRR without murmur. No JVD    Respiratory/Chest: CTA Bilaterally without wheezes or rales. Normal effort    GI/Abdomen: BS +, non-tender, non-distended Ext: no clubbing, cyanosis, or edema Psych: pleasant and cooperative  Skin: left AKA well approximated, shrinker Right foot no lesions MSK: right lateral malleolus and MT's seem tender to palpation   Neuro: Pt is cognitively appropriate with normal insight, memory, and awareness. Cranial nerves 2-12 are intact. Sensory exam absent LT below the Right knee  Reflexes are 2+ in all 4's. Fine motor coordination is intact. No tremors. Motor function is grossly 5/5 UE. LLE 3/5. RLE 4-5/5.   Musculoskeletal: low back mildly TTP. Stump with mild tenderness also, still swollen  -mild ankle, foot pain   Assessment/Plan: 1. Functional deficits which require 3+ hours per day of  interdisciplinary therapy in a comprehensive inpatient rehab setting. Physiatrist is providing close team supervision and 24 hour management of active medical problems listed below. Physiatrist and rehab team continue to assess barriers to discharge/monitor patient progress toward functional and medical goals  Care Tool:  Bathing    Body parts bathed by patient: Left arm, Right arm, Chest, Abdomen, Front perineal area, Right upper leg, Left upper leg, Right lower leg, Face   Body parts bathed by helper: Front perineal area, Buttocks Body parts n/a: Left lower leg   Bathing assist Assist Level: Supervision/Verbal cueing     Upper Body Dressing/Undressing Upper body dressing   What is the patient wearing?: Pull over shirt    Upper body assist Assist Level: Set up assist    Lower Body Dressing/Undressing Lower body dressing      What is the patient wearing?: Pants     Lower body assist Assist for lower body dressing: Moderate Assistance - Patient 50 - 74%     Toileting Toileting Toileting Activity did not occur (Clothing management and hygiene only): N/A (no void or bm)  Toileting assist Assist for toileting: Maximal Assistance - Patient 25 - 49%     Transfers Chair/bed transfer  Transfers assist     Chair/bed transfer assist level:  Minimal Assistance - Patient > 75% (stand pivot with RW)     Locomotion Ambulation   Ambulation assist   Ambulation activity did not occur: Safety/medical concerns  Assist level: Minimal Assistance - Patient > 75% Assistive device: Parallel bars Max distance: 5'   Walk 10 feet activity   Assist  Walk 10 feet activity did not occur: Safety/medical concerns        Walk 50 feet activity   Assist Walk 50 feet with 2 turns activity did not occur: Safety/medical concerns         Walk 150 feet activity   Assist Walk 150 feet activity did not occur: Safety/medical concerns         Walk 10 feet on uneven surface   activity   Assist Walk 10 feet on uneven surfaces activity did not occur: Safety/medical concerns         Wheelchair     Assist Will patient use wheelchair at discharge?: Yes Type of Wheelchair: Manual    Wheelchair assist level: Supervision/Verbal cueing Max wheelchair distance: 400    Wheelchair 50 feet with 2 turns activity    Assist        Assist Level: Supervision/Verbal cueing   Wheelchair 150 feet activity     Assist      Assist Level: Supervision/Verbal cueing   Blood pressure 126/70, pulse 90, temperature 98.7 F (37.1 C), temperature source Oral, resp. rate 14, height 5\' 11"  (1.803 m), weight 53.9 kg, SpO2 100 %.   Medical Problem List and Plan: 1.  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  Antithrombotics: -DVT/anticoagulation: Subcutaneous heparin             -antiplatelet therapy: Aspirin 81 mg daily 3. Pain Management: Oxycodone as needed -Peripheral neuropathy pain RIght foot added gabapentin              -massage and elevation to control pain/edema as well.  -oob to chair for LB pain.   -add voltaren gel to right foot 4. Mood: Provide emotional support             -antipsychotic agents: N/A 5. Neuropsych: This patient is capable of making decisions on his own behalf. 6. Skin/Wound Care: Routine skin checks             -will order shrinker per [employer] 7. Fluids/Electrolytes/Nutrition:    -BUN/Cr improved 7/21. Continue encourage fluids 8.  Acute blood loss anemia.  Transfused 2 units 05/22/2021.      -stool OB positive Tuesday but last HGB sl improved to 7.7   -recheck cbc tomorrow 7/29 9.  Hypertension.  Norvasc 10 mg daily, Tenormin 25 mg twice daily.     -normotensive at present 10.  Hyperlipidemia.  Lipitor 11.   Diabetes mellitus.  Hemoglobin A1c 5.2.  SSI.  Patient on Glucophage 500 mg daily prior to admission.   -CBG (last 3)  Recent Labs    06/03/21 1641 06/03/21 2120 06/04/21 0622  GLUCAP 127* 142* 113*   -resumed glucophage 500mg  7/21 with good results 12.  History of tobacco alcohol use.  Provide counseling 13.  Hyponatremia.  mild 133, same 7/21 14. SLow transit constipation:  Responded to sorbitol-- -holding colace for now    LOS: 10 days A FACE TO FACE EVALUATION WAS PERFORMED  Ranelle Oyster 06/04/2021, 11:49 AM

## 2021-06-04 NOTE — Progress Notes (Addendum)
Patient ID: Todd Duran, male   DOB: 07/21/1946, 75 y.o.   MRN: [id number]  SW met with pt, pt wife, and pt sister Todd Duran in room to provide [facility] list. Preferred [facility] is [facility]. SW informed will order DME for these items to be shipped to the home. Wife encouraged to bring w/c with her to the hospital on date of discharge for the travel back to home in the event it is needed. SW will f/u once aware if H[facility] is able to accept.  SW sent DME order: w/c, DABSC, RW, TTB, and slide board to [employer] [place] (p:[phone]/f:[phone]).   SW sent HHPT/OT/SN/aide referral to [facility], and waiting on follow-up if able to accept. *referral accepted by [facility]. SW updated [facility] SW on above. SW sent updated clinicals to support current orders.   Loralee Pacas, MSW, [place] Office: [phone] Cell: [phone] Fax: [phone]

## 2021-06-04 NOTE — H&P (View-Only) (Signed)
    Subjective  -  This is a 74-year-old gentleman who presented with a nonsalvageable ischemic left foot.  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  He has been up in rehab for his amputation.  He has developed a necrotic right fifth toe.  I will hold his Glucophage in anticipation of contrast for tomorrow.   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  He will continue with inpatient rehab.  Ischemic right toe: This was examined and discussed with the patient and his family who was present at the bedside.  He has known multilevel disease on the right.  I told him that his right toe is no longer salvageable.  He will need a right fifth toe amputation however without revascularization this will not heal.  I discussed proceeding with a right femoral endarterectomy, right iliac stent, and right superficial femoral artery stenting.  At the same time I will perform a right fifth toe amputation.  I plan on doing this.  He will be n.p.o. after midnight.  All questions were answered, and he wishes to proceed  Todd Duran 06/04/2021 4:35 PM --  Vitals:   06/04/21 0848 06/04/21 1334  BP: 126/70 107/60  Pulse: 90 86  Resp:  16  Temp:  97.9 F (36.6 C)  SpO2:  100%    Intake/Output Summary (Last 24 hours) at 06/04/2021 1635 Last data filed at 06/04/2021 1300 Gross per 24 hour  Intake 240 ml  Output 1200 ml  Net -960 ml     Laboratory CBC    Component Value Date/Time   WBC 7.8 06/01/2021 0701   HGB 7.7 (L) 06/03/2021 0608   HCT 24.3 (L) 06/03/2021 0608    PLT 560 (H) 06/01/2021 0701    BMET    Component Value Date/Time   NA 133 (L) 05/28/2021 0719   K 3.9 05/28/2021 0719   CL 100 05/28/2021 0719   CO2 24 05/28/2021 0719   GLUCOSE 185 (H) 05/28/2021 0719   BUN 20 05/28/2021 0719   CREATININE 0.81 05/28/2021 0719   CALCIUM 9.1 05/28/2021 0719   GFRNONAA >60 05/28/2021 0719    COAG No results found for: INR, PROTIME No results found for: PTT  Antibiotics Anti-infectives (From admission, onward)    None        V. Todd Duran IV, M.D., FACS [facility] Office: [phone] Pager:  [phone]  

## 2021-06-04 NOTE — Progress Notes (Signed)
Physical Therapy Session Note  Patient Details  Name: Todd Duran MRN: [id number] Date of Birth: 26-Sep-1946  Today's Date: 06/04/2021 PT Individual Time: 4098-1191 and 1105-1200 PT Individual Time Calculation (min): 40 min and 55 min  Short Term Goals: Week 2:  PT Short Term Goal 1 (Week 2): STGs = LTGs  Skilled Therapeutic Interventions/Progress Updates:     1st Session: Pt received supine in bed and agrees to therapy. Reports pain in R lower leg. Number not provided. PT provides rest breaks as needed to manage pain. Supine to sit with bed features and no assistance required. Pt sits at EOB to use urinal with supervision and cues for hip position for safety. Pt then transfers to [facility] with close supervision and cues for positioning and hand placement. Session focused on WC mobility and endurance training. Pt self propels WC >500'. Navigates elevator independently. Pt pushes up and down ramp with verbal cues for safety and propulsion technique. PT provides pt with WC gloves to prevent overuse injury of hands. Pt performs x20 scapular retractions with level 2 theraband and PT provides manual overpressure at scapulae for optimal scapulohumeral rhythm. Pt then performs x20 LAQs with R leg against level 2 therband resistance. WC transport back to room. Left seated in WC with alarm intact and all needs within reach.  2nd Session: Pt received seated in [facility] and agrees to therapy. Wife and sister present for family education. PT discusses pt progress with therapy and likely mobility level upon DC, with pt having made little progress toward standing and ambulation goals. Family informed that pt will likely be at supervision level for transfers and use of WC for mobility. Family also encouraged to install ramp as pt has 5 steps to enter home. PT was informed that having ramp by DC is very unlikely. Pt taken to gym to practice stair navigation with WC. PT demos removal of anti-tippers and placing pt in "wheelie"  position to bump up stairs backward. PT and pt's wife complete x4 steps with wife demonstrating good body mechanics and hand position on WC Pt then practices car transfers. Pt attempts stand pivot to car but is unable to complete stand, saying R leg will not support him at this moment. PT then educates on use of slideboard. Pt completes slideboard transfer to/from car with minA from PT. Then pt completes with wife providing assistance and demonstrating good safety and carryover from PT education. Pt propels WC back to room. Left seated with alarm intact and all needs within reach.  Therapy Documentation Precautions:  Precautions Precautions: Fall Precaution Comments: L AKA Restrictions Weight Bearing Restrictions: Yes LLE Weight Bearing: Non weight bearing   Therapy/Group: Individual Therapy  Beau Fanny, PT, DPT 06/04/2021, 12:35 PM

## 2021-06-04 NOTE — Progress Notes (Signed)
Occupational Therapy Session Note  Patient Details  Name: Todd Duran MRN: [id number] Date of Birth: [birth year]  Today's Date: 06/04/2021 OT Individual Time: 1002-1100 OT Individual Time Calculation (min): 58 min   Short Term Goals: Week 2:  OT Short Term Goal 1 (Week 2): Pt will complete toileting task with min A. OT Short Term Goal 2 (Week 2): Patient will complete sit<>stand in prep for BADL tasks with min A OT Short Term Goal 3 (Week 2): Pt will complete toilet transfer with CGA  Skilled Therapeutic Interventions/Progress Updates:    Pt greeted seated in wc and agreeable to OT treatment session. Wife and sister present for for family education. Discussed home set-up, wc access, bathroom, transfers, and residual limb care, and BADL modifications. Educated family on safety and proper body mechanics to assist with squat-pivot transfers. Pt propelled wc to therapy apartment and practiced tub bench transfer with wife. Discussed removed shower doors and placing curtain. Pt then returned to room and worked on bathing/dressing at the sink. OT educated on AKA shrnker and positioning of waist strap. Pt reported need to have a BM and  completed squat-pivot to drop arm BSC with CGA from spouse. Worked on lateral leans to manage clothing. Pt with successful BM. Set-up for peri-care using lateral leans. Handoff to nurse tech for clothing management and transfer back to wc due to time. Pt left in care of nurse tech and spouse.   Therapy Documentation Precautions:  Precautions Precautions: Fall Precaution Comments: L AKA Restrictions Weight Bearing Restrictions: Yes LLE Weight Bearing: Non weight bearing Pain:  Denies pain   Therapy/Group: Individual Therapy  Mal Amabile 06/04/2021, 10:55 AM

## 2021-06-04 NOTE — Progress Notes (Addendum)
    Subjective  -  This is a 75 year old gentleman who presented with a nonsalvageable ischemic left foot.  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  He has been up in rehab for his amputation.  He has developed a necrotic right fifth toe.  I will hold his Glucophage in anticipation of contrast for tomorrow.   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  He will continue with inpatient rehab.  Ischemic right toe: This was examined and discussed with the patient and his family who was present at the bedside.  He has known multilevel disease on the right.  I told him that his right toe is no longer salvageable.  He will need a right fifth toe amputation however without revascularization this will not heal.  I discussed proceeding with a right femoral endarterectomy, right iliac stent, and right superficial femoral artery stenting.  At the same time I will perform a right fifth toe amputation.  I plan on doing this.  He will be n.p.o. after midnight.  All questions were answered, and he wishes to proceed  Wells Michelena Culmer 06/04/2021 4:35 PM --  Vitals:   06/04/21 0848 06/04/21 1334  BP: 126/70 107/60  Pulse: 90 86  Resp:  16  Temp:  97.9 F (36.6 C)  SpO2:  100%    Intake/Output Summary (Last 24 hours) at 06/04/2021 1635 Last data filed at 06/04/2021 1300 Gross per 24 hour  Intake 240 ml  Output 1200 ml  Net -960 ml     Laboratory CBC    Component Value Date/Time   WBC 7.8 06/01/2021 0701   HGB 7.7 (L) 06/03/2021 0608   HCT 24.3 (L) 06/03/2021 0608    PLT 560 (H) 06/01/2021 0701    BMET    Component Value Date/Time   NA 133 (L) 05/28/2021 0719   K 3.9 05/28/2021 0719   CL 100 05/28/2021 0719   CO2 24 05/28/2021 0719   GLUCOSE 185 (H) 05/28/2021 0719   BUN 20 05/28/2021 0719   CREATININE 0.81 05/28/2021 0719   CALCIUM 9.1 05/28/2021 0719   GFRNONAA >60 05/28/2021 0719    COAG No results found for: INR, PROTIME No results found for: PTT  Antibiotics Anti-infectives (From admission, onward)    None        V. Charlena Cross, M.D., [facility] [place] Office: [phone] Pager:  [phone]

## 2021-06-05 ENCOUNTER — Inpatient Hospital Stay (HOSPITAL_COMMUNITY): Payer: No Typology Code available for payment source | Admitting: Anesthesiology

## 2021-06-05 ENCOUNTER — Encounter (HOSPITAL_COMMUNITY): Payer: Self-pay | Admitting: Physical Medicine & Rehabilitation

## 2021-06-05 ENCOUNTER — Inpatient Hospital Stay (HOSPITAL_COMMUNITY): Payer: No Typology Code available for payment source

## 2021-06-05 ENCOUNTER — Encounter (HOSPITAL_COMMUNITY)
Admission: RE | Disposition: A | Discharge status: Home / Self Care | Payer: Self-pay | Source: Intra-hospital | Attending: Surgery

## 2021-06-05 ENCOUNTER — Inpatient Hospital Stay (HOSPITAL_COMMUNITY)
Admission: RE | Admit: 2021-06-05 | Discharge: 2021-06-23 | DRG: 271 | Disposition: A | Discharge status: Home / Self Care | Payer: No Typology Code available for payment source | Source: Intra-hospital | Attending: Surgery | Admitting: Surgery

## 2021-06-05 DIAGNOSIS — L89152 Pressure ulcer of sacral region, stage 2: Secondary | ICD-10-CM | POA: Diagnosis present

## 2021-06-05 DIAGNOSIS — D62 Acute posthemorrhagic anemia: Secondary | ICD-10-CM | POA: Diagnosis present

## 2021-06-05 DIAGNOSIS — Z888 Allergy status to other drugs, medicaments and biological substances status: Secondary | ICD-10-CM

## 2021-06-05 DIAGNOSIS — E11621 Type 2 diabetes mellitus with foot ulcer: Secondary | ICD-10-CM | POA: Diagnosis present

## 2021-06-05 DIAGNOSIS — E785 Hyperlipidemia, unspecified: Secondary | ICD-10-CM | POA: Diagnosis present

## 2021-06-05 DIAGNOSIS — I70235 Atherosclerosis of native arteries of right leg with ulceration of other part of foot: Secondary | ICD-10-CM | POA: Diagnosis not present

## 2021-06-05 DIAGNOSIS — E1142 Type 2 diabetes mellitus with diabetic polyneuropathy: Secondary | ICD-10-CM | POA: Diagnosis present

## 2021-06-05 DIAGNOSIS — Z7984 Long term (current) use of oral hypoglycemic drugs: Secondary | ICD-10-CM

## 2021-06-05 DIAGNOSIS — Z89511 Acquired absence of right leg below knee: Secondary | ICD-10-CM | POA: Diagnosis not present

## 2021-06-05 DIAGNOSIS — Z79899 Other long term (current) drug therapy: Secondary | ICD-10-CM | POA: Diagnosis not present

## 2021-06-05 DIAGNOSIS — I701 Atherosclerosis of renal artery: Secondary | ICD-10-CM | POA: Diagnosis present

## 2021-06-05 DIAGNOSIS — I70261 Atherosclerosis of native arteries of extremities with gangrene, right leg: Secondary | ICD-10-CM | POA: Diagnosis present

## 2021-06-05 DIAGNOSIS — Z7982 Long term (current) use of aspirin: Secondary | ICD-10-CM | POA: Diagnosis not present

## 2021-06-05 DIAGNOSIS — L97519 Non-pressure chronic ulcer of other part of right foot with unspecified severity: Secondary | ICD-10-CM | POA: Diagnosis present

## 2021-06-05 DIAGNOSIS — I10 Essential (primary) hypertension: Secondary | ICD-10-CM | POA: Diagnosis present

## 2021-06-05 DIAGNOSIS — I708 Atherosclerosis of other arteries: Secondary | ICD-10-CM | POA: Diagnosis present

## 2021-06-05 DIAGNOSIS — E1152 Type 2 diabetes mellitus with diabetic peripheral angiopathy with gangrene: Principal | ICD-10-CM | POA: Diagnosis present

## 2021-06-05 DIAGNOSIS — Z89612 Acquired absence of left leg above knee: Secondary | ICD-10-CM

## 2021-06-05 DIAGNOSIS — E119 Type 2 diabetes mellitus without complications: Secondary | ICD-10-CM | POA: Diagnosis not present

## 2021-06-05 DIAGNOSIS — I739 Peripheral vascular disease, unspecified: Secondary | ICD-10-CM | POA: Diagnosis not present

## 2021-06-05 DIAGNOSIS — I70209 Unspecified atherosclerosis of native arteries of extremities, unspecified extremity: Secondary | ICD-10-CM | POA: Diagnosis present

## 2021-06-05 DIAGNOSIS — F17210 Nicotine dependence, cigarettes, uncomplicated: Secondary | ICD-10-CM | POA: Diagnosis present

## 2021-06-05 DIAGNOSIS — Z4781 Encounter for orthopedic aftercare following surgical amputation: Secondary | ICD-10-CM | POA: Diagnosis not present

## 2021-06-05 DIAGNOSIS — M62271 Nontraumatic ischemic infarction of muscle, right ankle and foot: Secondary | ICD-10-CM | POA: Diagnosis not present

## 2021-06-05 DIAGNOSIS — E871 Hypo-osmolality and hyponatremia: Secondary | ICD-10-CM | POA: Diagnosis not present

## 2021-06-05 DIAGNOSIS — L8990 Pressure ulcer of unspecified site, unspecified stage: Secondary | ICD-10-CM | POA: Insufficient documentation

## 2021-06-05 HISTORY — PX: INSERTION OF ILIAC STENT: SHX6256

## 2021-06-05 HISTORY — PX: PATCH ANGIOPLASTY: SHX6230

## 2021-06-05 HISTORY — PX: ENDARTERECTOMY FEMORAL: SHX5804

## 2021-06-05 HISTORY — PX: AMPUTATION: SHX166

## 2021-06-05 LAB — PREPARE RBC (CROSSMATCH)

## 2021-06-05 LAB — POCT I-STAT 7, (LYTES, BLD GAS, ICA,H+H)
Acid-Base Excess: 1 mmol/L (ref 0.0–2.0)
Acid-Base Excess: 1 mmol/L (ref 0.0–2.0)
Bicarbonate: 25.8 mmol/L (ref 20.0–28.0)
Bicarbonate: 26.6 mmol/L (ref 20.0–28.0)
Calcium, Ion: 1.45 mmol/L — ABNORMAL HIGH (ref 1.15–1.40)
Calcium, Ion: 1.35 mmol/L (ref 1.15–1.40)
HCT: 20 % — ABNORMAL LOW (ref 39.0–52.0)
HCT: 20 % — ABNORMAL LOW (ref 39.0–52.0)
Hemoglobin: 6.8 g/dL — CL (ref 13.0–17.0)
Hemoglobin: 6.8 g/dL — CL (ref 13.0–17.0)
O2 Saturation: 100 %
O2 Saturation: 100 %
Potassium: 4.4 mmol/L (ref 3.5–5.1)
Potassium: 4.8 mmol/L (ref 3.5–5.1)
Sodium: 132 mmol/L — ABNORMAL LOW (ref 135–145)
Sodium: 131 mmol/L — ABNORMAL LOW (ref 135–145)
TCO2: 27 mmol/L (ref 22–32)
TCO2: 28 mmol/L (ref 22–32)
pCO2 arterial: 40.5 mmHg (ref 32.0–48.0)
pCO2 arterial: 44.7 mmHg (ref 32.0–48.0)
pH, Arterial: 7.412 (ref 7.350–7.450)
pH, Arterial: 7.382 (ref 7.350–7.450)
pO2, Arterial: 303 mmHg — ABNORMAL HIGH (ref 83.0–108.0)
pO2, Arterial: 293 mmHg — ABNORMAL HIGH (ref 83.0–108.0)

## 2021-06-05 LAB — CBC
HCT: 22.7 % — ABNORMAL LOW (ref 39.0–52.0)
Hemoglobin: 7.1 g/dL — ABNORMAL LOW (ref 13.0–17.0)
MCH: 26.9 pg (ref 26.0–34.0)
MCHC: 31.3 g/dL (ref 30.0–36.0)
MCV: 86 fL (ref 80.0–100.0)
Platelets: 459 10*3/uL — ABNORMAL HIGH (ref 150–400)
RBC: 2.64 MIL/uL — ABNORMAL LOW (ref 4.22–5.81)
RDW: 15.4 % (ref 11.5–15.5)
WBC: 7.4 10*3/uL (ref 4.0–10.5)
nRBC: 0 % (ref 0.0–0.2)

## 2021-06-05 LAB — GLUCOSE, CAPILLARY
Glucose-Capillary: 112 mg/dL — ABNORMAL HIGH (ref 70–99)
Glucose-Capillary: 87 mg/dL (ref 70–99)
Glucose-Capillary: 79 mg/dL (ref 70–99)
Glucose-Capillary: 105 mg/dL — ABNORMAL HIGH (ref 70–99)
Glucose-Capillary: 114 mg/dL — ABNORMAL HIGH (ref 70–99)

## 2021-06-05 LAB — POCT I-STAT, CHEM 8
BUN: 15 mg/dL (ref 8–23)
BUN: 16 mg/dL (ref 8–23)
Calcium, Ion: 1.39 mmol/L (ref 1.15–1.40)
Calcium, Ion: 1.39 mmol/L (ref 1.15–1.40)
Chloride: 100 mmol/L (ref 98–111)
Chloride: 101 mmol/L (ref 98–111)
Creatinine, Ser: 0.7 mg/dL (ref 0.61–1.24)
Creatinine, Ser: 0.7 mg/dL (ref 0.61–1.24)
Glucose, Bld: 103 mg/dL — ABNORMAL HIGH (ref 70–99)
Glucose, Bld: 110 mg/dL — ABNORMAL HIGH (ref 70–99)
HCT: 25 % — ABNORMAL LOW (ref 39.0–52.0)
HCT: 24 % — ABNORMAL LOW (ref 39.0–52.0)
Hemoglobin: 8.5 g/dL — ABNORMAL LOW (ref 13.0–17.0)
Hemoglobin: 8.2 g/dL — ABNORMAL LOW (ref 13.0–17.0)
Potassium: 5.1 mmol/L (ref 3.5–5.1)
Potassium: 5.7 mmol/L — ABNORMAL HIGH (ref 3.5–5.1)
Sodium: 131 mmol/L — ABNORMAL LOW (ref 135–145)
Sodium: 131 mmol/L — ABNORMAL LOW (ref 135–145)
TCO2: 26 mmol/L (ref 22–32)
TCO2: 24 mmol/L (ref 22–32)

## 2021-06-05 LAB — POCT ACTIVATED CLOTTING TIME
Activated Clotting Time: 208 seconds
Activated Clotting Time: 237 seconds
Activated Clotting Time: 254 seconds
Activated Clotting Time: 237 seconds

## 2021-06-05 SURGERY — ENDARTERECTOMY, FEMORAL
Anesthesia: General | Laterality: Right

## 2021-06-05 MED ORDER — PHENYLEPHRINE 40 MCG/ML (10ML) SYRINGE FOR IV PUSH (FOR BLOOD PRESSURE SUPPORT)
PREFILLED_SYRINGE | INTRAVENOUS | Status: DC | PRN
Start: 2021-06-05 — End: 2021-06-05
  Administered 2021-06-05: 80 ug via INTRAVENOUS
  Administered 2021-06-05: 120 ug via INTRAVENOUS
  Administered 2021-06-05: 80 ug via INTRAVENOUS
  Administered 2021-06-05 (×4): 120 ug via INTRAVENOUS

## 2021-06-05 MED ORDER — CHLORHEXIDINE GLUCONATE 0.12 % MT SOLN: 15.0000 mL | Freq: Once | OROMUCOSAL | Status: AC

## 2021-06-05 MED ORDER — LACTATED RINGERS IV SOLN: INTRAVENOUS | Status: DC | PRN

## 2021-06-05 MED ORDER — OXYCODONE-ACETAMINOPHEN 5-325 MG PO TABS
1.0000 | ORAL_TABLET | ORAL | Status: DC | PRN
  Administered 2021-06-05 – 2021-06-21 (×45): 2 via ORAL
  Administered 2021-06-21: 1 via ORAL
  Administered 2021-06-22 – 2021-06-23 (×6): 2 via ORAL
  Filled 2021-06-05 (×15): qty 2
  Filled 2021-06-05: qty 1
  Filled 2021-06-05 (×38): qty 2

## 2021-06-05 MED ORDER — CEFAZOLIN SODIUM-DEXTROSE 2-3 GM-%(50ML) IV SOLR
INTRAVENOUS | Status: DC | PRN
  Administered 2021-06-05 (×2): 2 g via INTRAVENOUS

## 2021-06-05 MED ORDER — MAGNESIUM SULFATE 2 GM/50ML IV SOLN: 2.0000 g | Freq: Every day | INTRAVENOUS | Status: DC | PRN

## 2021-06-05 MED ORDER — PROTAMINE SULFATE 10 MG/ML IV SOLN
INTRAVENOUS | Status: DC | PRN
  Administered 2021-06-05 (×2): 20 mg via INTRAVENOUS
  Administered 2021-06-05: 10 mg via INTRAVENOUS

## 2021-06-05 MED ORDER — PHENOL 1.4 % MT LIQD: 1.0000 | OROMUCOSAL | Status: DC | PRN

## 2021-06-05 MED ORDER — CEFAZOLIN SODIUM-DEXTROSE 2-4 GM/100ML-% IV SOLN
INTRAVENOUS | Status: AC
  Filled 2021-06-05: qty 100

## 2021-06-05 MED ORDER — FENTANYL CITRATE (PF) 250 MCG/5ML IJ SOLN
INTRAMUSCULAR | Status: DC | PRN
  Administered 2021-06-05 (×4): 50 ug via INTRAVENOUS
  Administered 2021-06-05: 100 ug via INTRAVENOUS
  Administered 2021-06-05: 50 ug via INTRAVENOUS
  Administered 2021-06-05: 100 ug via INTRAVENOUS
  Administered 2021-06-05: 50 ug via INTRAVENOUS

## 2021-06-05 MED ORDER — FENTANYL CITRATE (PF) 250 MCG/5ML IJ SOLN
INTRAMUSCULAR | Status: AC
  Filled 2021-06-05: qty 5

## 2021-06-05 MED ORDER — FENTANYL CITRATE (PF) 100 MCG/2ML IJ SOLN
25.0000 ug | INTRAMUSCULAR | Status: DC | PRN
Start: 2021-06-05 — End: 2021-06-05

## 2021-06-05 MED ORDER — ATORVASTATIN CALCIUM 10 MG PO TABS
20.0000 mg | ORAL_TABLET | Freq: Every day | ORAL | Status: DC
  Filled 2021-06-05: qty 2

## 2021-06-05 MED ORDER — ORAL CARE MOUTH RINSE: 15.0000 mL | Freq: Once | OROMUCOSAL | Status: AC

## 2021-06-05 MED ORDER — ADULT MULTIVITAMIN W/MINERALS CH
1.0000 | ORAL_TABLET | Freq: Every day | ORAL | Status: DC
  Administered 2021-06-06 – 2021-06-23 (×16): 1 via ORAL
  Filled 2021-06-05 (×16): qty 1

## 2021-06-05 MED ORDER — PHENYLEPHRINE 40 MCG/ML (10ML) SYRINGE FOR IV PUSH (FOR BLOOD PRESSURE SUPPORT)
PREFILLED_SYRINGE | INTRAVENOUS | Status: AC
  Filled 2021-06-05: qty 10

## 2021-06-05 MED ORDER — LABETALOL HCL 5 MG/ML IV SOLN: 10.0000 mg | INTRAVENOUS | Status: DC | PRN

## 2021-06-05 MED ORDER — POTASSIUM CHLORIDE CRYS ER 20 MEQ PO TBCR: 20.0000 meq | EXTENDED_RELEASE_TABLET | Freq: Every day | ORAL | Status: DC | PRN

## 2021-06-05 MED ORDER — ESMOLOL HCL 100 MG/10ML IV SOLN
INTRAVENOUS | Status: AC
  Filled 2021-06-05: qty 10

## 2021-06-05 MED ORDER — HEMOSTATIC AGENTS (NO CHARGE) OPTIME
TOPICAL | Status: DC | PRN
  Administered 2021-06-05: 2 via TOPICAL

## 2021-06-05 MED ORDER — METOPROLOL TARTRATE 5 MG/5ML IV SOLN: 2.0000 mg | INTRAVENOUS | Status: DC | PRN

## 2021-06-05 MED ORDER — ACETAMINOPHEN 650 MG RE SUPP: 325.0000 mg | RECTAL | Status: DC | PRN

## 2021-06-05 MED ORDER — OXYCODONE-ACETAMINOPHEN 5-325 MG PO TABS: 1.0000 | ORAL_TABLET | ORAL | 0 refills | Status: AC | PRN

## 2021-06-05 MED ORDER — HEPARIN 6000 UNIT IRRIGATION SOLUTION
Status: AC
  Filled 2021-06-05: qty 500

## 2021-06-05 MED ORDER — GABAPENTIN 100 MG PO CAPS: 100.0000 mg | ORAL_CAPSULE | Freq: Two times a day (BID) | ORAL | Status: AC

## 2021-06-05 MED ORDER — HEPARIN 6000 UNIT IRRIGATION SOLUTION
Status: DC | PRN
  Administered 2021-06-05: 1

## 2021-06-05 MED ORDER — LACTATED RINGERS IV SOLN: INTRAVENOUS | Status: DC

## 2021-06-05 MED ORDER — OXYBUTYNIN CHLORIDE 5 MG PO TABS
2.5000 mg | ORAL_TABLET | Freq: Two times a day (BID) | ORAL | Status: DC
  Administered 2021-06-05 – 2021-06-23 (×32): 2.5 mg via ORAL
  Filled 2021-06-05 (×37): qty 0.5

## 2021-06-05 MED ORDER — METFORMIN HCL 500 MG PO TABS
500.0000 mg | ORAL_TABLET | Freq: Every day | ORAL | Status: DC
  Administered 2021-06-06 – 2021-06-23 (×16): 500 mg via ORAL
  Filled 2021-06-05 (×16): qty 1

## 2021-06-05 MED ORDER — LABETALOL HCL 5 MG/ML IV SOLN
10.0000 mg | INTRAVENOUS | Status: DC | PRN
Start: 2021-06-05 — End: 2021-06-05

## 2021-06-05 MED ORDER — 0.9 % SODIUM CHLORIDE (POUR BTL) OPTIME
TOPICAL | Status: DC | PRN
  Administered 2021-06-05: 1000 mL

## 2021-06-05 MED ORDER — CEFAZOLIN SODIUM 1 G IJ SOLR
INTRAMUSCULAR | Status: AC
  Filled 2021-06-05: qty 20

## 2021-06-05 MED ORDER — OXYCODONE HCL 5 MG/5ML PO SOLN
5.0000 mg | Freq: Once | ORAL | Status: DC | PRN
Start: 2021-06-05 — End: 2021-06-05

## 2021-06-05 MED ORDER — ACETAMINOPHEN 325 MG PO TABS
325.0000 mg | ORAL_TABLET | ORAL | Status: DC | PRN
Start: 2021-06-05 — End: 2021-06-23
  Administered 2021-06-07 – 2021-06-16 (×2): 650 mg via ORAL
  Filled 2021-06-05 (×3): qty 2

## 2021-06-05 MED ORDER — MAGNESIUM SULFATE 2 GM/50ML IV SOLN
2.0000 g | Freq: Every day | INTRAVENOUS | Status: DC | PRN
Start: 2021-06-05 — End: 2021-06-05

## 2021-06-05 MED ORDER — SUGAMMADEX SODIUM 200 MG/2ML IV SOLN
INTRAVENOUS | Status: DC | PRN
Start: 2021-06-05 — End: 2021-06-05
  Administered 2021-06-05: 200 mg via INTRAVENOUS

## 2021-06-05 MED ORDER — PHENYLEPHRINE 40 MCG/ML (10ML) SYRINGE FOR IV PUSH (FOR BLOOD PRESSURE SUPPORT)
PREFILLED_SYRINGE | INTRAVENOUS | Status: AC
  Filled 2021-06-05: qty 30

## 2021-06-05 MED ORDER — LIDOCAINE 2% (20 MG/ML) 5 ML SYRINGE
INTRAMUSCULAR | Status: AC
  Filled 2021-06-05: qty 5

## 2021-06-05 MED ORDER — FERROUS SULFATE 325 (65 FE) MG PO TABS: 325.0000 mg | ORAL_TABLET | Freq: Every day | ORAL | 3 refills | Status: AC

## 2021-06-05 MED ORDER — AMLODIPINE BESYLATE 10 MG PO TABS
10.0000 mg | ORAL_TABLET | Freq: Every day | ORAL | Status: DC
  Administered 2021-06-07 – 2021-06-23 (×13): 10 mg via ORAL
  Filled 2021-06-05 (×16): qty 1

## 2021-06-05 MED ORDER — ALUM & MAG HYDROXIDE-SIMETH 200-200-20 MG/5ML PO SUSP: 15.0000 mL | ORAL | Status: DC | PRN

## 2021-06-05 MED ORDER — DICLOFENAC SODIUM 1 % EX GEL: 2.0000 g | Freq: Three times a day (TID) | CUTANEOUS | Status: AC

## 2021-06-05 MED ORDER — INSULIN ASPART 100 UNIT/ML IJ SOLN
0.0000 [IU] | Freq: Three times a day (TID) | INTRAMUSCULAR | Status: DC
  Administered 2021-06-06: 3 [IU] via SUBCUTANEOUS
  Administered 2021-06-06 – 2021-06-10 (×5): 2 [IU] via SUBCUTANEOUS
  Administered 2021-06-11: 3 [IU] via SUBCUTANEOUS
  Administered 2021-06-11: 5 [IU] via SUBCUTANEOUS
  Administered 2021-06-11 – 2021-06-13 (×4): 3 [IU] via SUBCUTANEOUS
  Administered 2021-06-14 (×3): 2 [IU] via SUBCUTANEOUS
  Administered 2021-06-15: 3 [IU] via SUBCUTANEOUS
  Administered 2021-06-15 (×2): 2 [IU] via SUBCUTANEOUS
  Administered 2021-06-16: 3 [IU] via SUBCUTANEOUS
  Administered 2021-06-16: 2 [IU] via SUBCUTANEOUS
  Administered 2021-06-17: 5 [IU] via SUBCUTANEOUS
  Administered 2021-06-18 (×2): 2 [IU] via SUBCUTANEOUS
  Administered 2021-06-19: 3 [IU] via SUBCUTANEOUS
  Administered 2021-06-21: 8 [IU] via SUBCUTANEOUS
  Administered 2021-06-22: 2 [IU] via SUBCUTANEOUS
  Administered 2021-06-22: 3 [IU] via SUBCUTANEOUS
  Administered 2021-06-23: 2 [IU] via SUBCUTANEOUS

## 2021-06-05 MED ORDER — SODIUM CHLORIDE 0.9 % IV SOLN: INTRAVENOUS | Status: DC | PRN

## 2021-06-05 MED ORDER — PANTOPRAZOLE SODIUM 40 MG PO TBEC
40.0000 mg | DELAYED_RELEASE_TABLET | Freq: Every day | ORAL | Status: DC
  Administered 2021-06-06 – 2021-06-23 (×16): 40 mg via ORAL
  Filled 2021-06-05 (×16): qty 1

## 2021-06-05 MED ORDER — IODIXANOL 320 MG/ML IV SOLN
INTRAVENOUS | Status: DC | PRN
  Administered 2021-06-05: 70 mL via INTRA_ARTERIAL

## 2021-06-05 MED ORDER — ASPIRIN EC 81 MG PO TBEC
81.0000 mg | DELAYED_RELEASE_TABLET | Freq: Every day | ORAL | Status: DC
  Administered 2021-06-06 – 2021-06-23 (×16): 81 mg via ORAL
  Filled 2021-06-05 (×16): qty 1

## 2021-06-05 MED ORDER — HEPARIN SODIUM (PORCINE) 5000 UNIT/ML IJ SOLN
5000.0000 [IU] | Freq: Three times a day (TID) | INTRAMUSCULAR | Status: DC
  Administered 2021-06-06 – 2021-06-23 (×49): 5000 [IU] via SUBCUTANEOUS
  Filled 2021-06-05 (×47): qty 1

## 2021-06-05 MED ORDER — HEPARIN SODIUM (PORCINE) 5000 UNIT/ML IJ SOLN: 5000.0000 [IU] | Freq: Three times a day (TID) | INTRAMUSCULAR | Status: DC

## 2021-06-05 MED ORDER — DOCUSATE SODIUM 100 MG PO CAPS: 100.0000 mg | ORAL_CAPSULE | Freq: Every day | ORAL | Status: DC

## 2021-06-05 MED ORDER — ONDANSETRON HCL 4 MG/2ML IJ SOLN
INTRAMUSCULAR | Status: DC | PRN
  Administered 2021-06-05: 4 mg via INTRAVENOUS

## 2021-06-05 MED ORDER — ACETAMINOPHEN 325 MG PO TABS: 325.0000 mg | ORAL_TABLET | ORAL | Status: DC | PRN

## 2021-06-05 MED ORDER — GUAIFENESIN-DM 100-10 MG/5ML PO SYRP: 15.0000 mL | ORAL_SOLUTION | ORAL | Status: DC | PRN

## 2021-06-05 MED ORDER — DEXAMETHASONE SODIUM PHOSPHATE 10 MG/ML IJ SOLN
INTRAMUSCULAR | Status: DC | PRN
  Administered 2021-06-05: 5 mg via INTRAVENOUS

## 2021-06-05 MED ORDER — PHENYLEPHRINE HCL-NACL 10-0.9 MG/250ML-% IV SOLN
INTRAVENOUS | Status: DC | PRN
  Administered 2021-06-05: 20 ug/min via INTRAVENOUS

## 2021-06-05 MED ORDER — FERROUS SULFATE 325 (65 FE) MG PO TABS
325.0000 mg | ORAL_TABLET | Freq: Every day | ORAL | Status: DC
  Administered 2021-06-06 – 2021-06-23 (×16): 325 mg via ORAL
  Filled 2021-06-05 (×16): qty 1

## 2021-06-05 MED ORDER — ROCURONIUM BROMIDE 10 MG/ML (PF) SYRINGE
PREFILLED_SYRINGE | INTRAVENOUS | Status: AC
  Filled 2021-06-05: qty 10

## 2021-06-05 MED ORDER — IOHEXOL 350 MG/ML SOLN
INTRAVENOUS | Status: DC | PRN
  Administered 2021-06-05: 15 mL via INTRA_ARTERIAL

## 2021-06-05 MED ORDER — ALBUMIN HUMAN 5 % IV SOLN: INTRAVENOUS | Status: DC | PRN

## 2021-06-05 MED ORDER — ONDANSETRON HCL 4 MG/2ML IJ SOLN
INTRAMUSCULAR | Status: AC
  Filled 2021-06-05: qty 2

## 2021-06-05 MED ORDER — POLYETHYLENE GLYCOL 3350 17 G PO PACK: 17.0000 g | PACK | Freq: Every day | ORAL | 0 refills | Status: AC | PRN

## 2021-06-05 MED ORDER — SODIUM CHLORIDE 0.9% IV SOLUTION: Freq: Once | INTRAVENOUS | Status: DC

## 2021-06-05 MED ORDER — LIDOCAINE 2% (20 MG/ML) 5 ML SYRINGE
INTRAMUSCULAR | Status: DC | PRN
  Administered 2021-06-05: 60 mg via INTRAVENOUS

## 2021-06-05 MED ORDER — OXYCODONE HCL 5 MG PO TABS: 5.0000 mg | ORAL_TABLET | Freq: Once | ORAL | Status: DC | PRN

## 2021-06-05 MED ORDER — ATENOLOL 25 MG PO TABS
25.0000 mg | ORAL_TABLET | Freq: Two times a day (BID) | ORAL | Status: DC
  Administered 2021-06-06 – 2021-06-23 (×33): 25 mg via ORAL
  Filled 2021-06-05 (×33): qty 1

## 2021-06-05 MED ORDER — HEPARIN SODIUM (PORCINE) 1000 UNIT/ML IJ SOLN
INTRAMUSCULAR | Status: DC | PRN
  Administered 2021-06-05: 5500 [IU] via INTRAVENOUS
  Administered 2021-06-05: 3000 [IU] via INTRAVENOUS
  Administered 2021-06-05 (×2): 1000 [IU] via INTRAVENOUS

## 2021-06-05 MED ORDER — DOCUSATE SODIUM 100 MG PO CAPS
100.0000 mg | ORAL_CAPSULE | Freq: Every day | ORAL | Status: DC
  Administered 2021-06-06 – 2021-06-23 (×15): 100 mg via ORAL
  Filled 2021-06-05 (×16): qty 1

## 2021-06-05 MED ORDER — CEFAZOLIN SODIUM-DEXTROSE 2-4 GM/100ML-% IV SOLN
2.0000 g | Freq: Three times a day (TID) | INTRAVENOUS | Status: AC
  Administered 2021-06-06 (×2): 2 g via INTRAVENOUS
  Filled 2021-06-05 (×2): qty 100

## 2021-06-05 MED ORDER — CEFAZOLIN SODIUM-DEXTROSE 2-4 GM/100ML-% IV SOLN
2.0000 g | Freq: Three times a day (TID) | INTRAVENOUS | Status: DC
Start: 2021-06-05 — End: 2021-06-05

## 2021-06-05 MED ORDER — SODIUM CHLORIDE 0.9 % IV SOLN: 500.0000 mL | Freq: Once | INTRAVENOUS | Status: DC | PRN

## 2021-06-05 MED ORDER — HYDRALAZINE HCL 20 MG/ML IJ SOLN: 5.0000 mg | INTRAMUSCULAR | Status: DC | PRN

## 2021-06-05 MED ORDER — OXYCODONE-ACETAMINOPHEN 5-325 MG PO TABS: 1.0000 | ORAL_TABLET | ORAL | Status: DC | PRN

## 2021-06-05 MED ORDER — CHLORHEXIDINE GLUCONATE 0.12 % MT SOLN
OROMUCOSAL | Status: AC
  Administered 2021-06-05: 15 mL via OROMUCOSAL
  Filled 2021-06-05: qty 15

## 2021-06-05 MED ORDER — ONDANSETRON HCL 4 MG/2ML IJ SOLN: 4.0000 mg | Freq: Four times a day (QID) | INTRAMUSCULAR | Status: DC | PRN

## 2021-06-05 MED ORDER — ALUM & MAG HYDROXIDE-SIMETH 200-200-20 MG/5ML PO SUSP
15.0000 mL | ORAL | Status: DC | PRN
  Filled 2021-06-05 (×2): qty 30

## 2021-06-05 MED ORDER — MORPHINE SULFATE (PF) 2 MG/ML IV SOLN
2.0000 mg | INTRAVENOUS | Status: DC | PRN
  Administered 2021-06-06: 2 mg via INTRAVENOUS
  Filled 2021-06-05: qty 1

## 2021-06-05 MED ORDER — MIDAZOLAM HCL 2 MG/2ML IJ SOLN
INTRAMUSCULAR | Status: AC
  Filled 2021-06-05: qty 2

## 2021-06-05 MED ORDER — PROPOFOL 10 MG/ML IV BOLUS
INTRAVENOUS | Status: AC
  Filled 2021-06-05: qty 20

## 2021-06-05 MED ORDER — POLYETHYLENE GLYCOL 3350 17 G PO PACK
17.0000 g | PACK | Freq: Every day | ORAL | Status: DC | PRN
  Administered 2021-06-09 – 2021-06-12 (×3): 17 g via ORAL
  Filled 2021-06-05 (×3): qty 1

## 2021-06-05 MED ORDER — GABAPENTIN 100 MG PO CAPS
100.0000 mg | ORAL_CAPSULE | Freq: Two times a day (BID) | ORAL | Status: DC
  Administered 2021-06-05 – 2021-06-23 (×33): 100 mg via ORAL
  Filled 2021-06-05 (×33): qty 1

## 2021-06-05 MED ORDER — ROCURONIUM BROMIDE 10 MG/ML (PF) SYRINGE
PREFILLED_SYRINGE | INTRAVENOUS | Status: DC | PRN
Start: 2021-06-05 — End: 2021-06-05
  Administered 2021-06-05: 50 mg via INTRAVENOUS
  Administered 2021-06-05: 30 mg via INTRAVENOUS
  Administered 2021-06-05: 50 mg via INTRAVENOUS

## 2021-06-05 MED ORDER — PANTOPRAZOLE SODIUM 40 MG PO TBEC: 40.0000 mg | DELAYED_RELEASE_TABLET | Freq: Every day | ORAL | Status: DC

## 2021-06-05 MED ORDER — FOLIC ACID 1 MG PO TABS
1.0000 mg | ORAL_TABLET | Freq: Every day | ORAL | Status: DC
  Administered 2021-06-06 – 2021-06-23 (×16): 1 mg via ORAL
  Filled 2021-06-05 (×16): qty 1

## 2021-06-05 MED ORDER — PROPOFOL 10 MG/ML IV BOLUS
INTRAVENOUS | Status: DC | PRN
Start: 2021-06-05 — End: 2021-06-05
  Administered 2021-06-05: 40 mg via INTRAVENOUS
  Administered 2021-06-05: 100 mg via INTRAVENOUS

## 2021-06-05 SURGICAL SUPPLY — 98 items
ADH SKN CLS APL DERMABOND .7 (GAUZE/BANDAGES/DRESSINGS) ×1
AGENT HMST KT MTR STRL THRMB (HEMOSTASIS) ×2
BAG COUNTER SPONGE SURGICOUNT (BAG) ×2 IMPLANT
BAG SPNG CNTER NS LX DISP (BAG) ×1
BALLN MUSTANG 4X100X135 (BALLOONS) ×2
BALLN MUSTANG 8X60X75 (BALLOONS) ×2
BALLOON MUSTANG 4X100X135 (BALLOONS) IMPLANT
BALLOON MUSTANG 8X60X75 (BALLOONS) IMPLANT
BLADE AVERAGE 25X9 (BLADE) ×2 IMPLANT
BLADE SAW SGTL 81X20 HD (BLADE) IMPLANT
BNDG ELASTIC 4X5.8 VLCR STR LF (GAUZE/BANDAGES/DRESSINGS) ×2 IMPLANT
BNDG ELASTIC 6X5.8 VLCR STR LF (GAUZE/BANDAGES/DRESSINGS) ×2 IMPLANT
BNDG GAUZE ELAST 4 BULKY (GAUZE/BANDAGES/DRESSINGS) ×2 IMPLANT
CANISTER SUCT 3000ML PPV (MISCELLANEOUS) ×2 IMPLANT
CATH BEACON 5 .035 65 KMP TIP (CATHETERS) ×1 IMPLANT
CATH EMB 4FR 80CM (CATHETERS) ×1 IMPLANT
CATH OMNI FLUSH 5F 65CM (CATHETERS) ×3 IMPLANT
CATH QUICKCROSS SUPP .035X90CM (MICROCATHETER) ×1 IMPLANT
CLIP VESOCCLUDE MED 24/CT (CLIP) ×2 IMPLANT
CLIP VESOCCLUDE SM WIDE 24/CT (CLIP) ×2 IMPLANT
COVER PROBE W GEL 5X96 (DRAPES) ×1 IMPLANT
COVER SURGICAL LIGHT HANDLE (MISCELLANEOUS) ×2 IMPLANT
DERMABOND ADVANCED (GAUZE/BANDAGES/DRESSINGS) ×1
DERMABOND ADVANCED .7 DNX12 (GAUZE/BANDAGES/DRESSINGS) ×1 IMPLANT
DEVICE TORQUE H2O (MISCELLANEOUS) ×1 IMPLANT
DRAIN CHANNEL 15F RND FF W/TCR (WOUND CARE) IMPLANT
DRAPE C-ARM 42X72 X-RAY (DRAPES) IMPLANT
DRAPE EXTREMITY T 121X128X90 (DISPOSABLE) ×2 IMPLANT
DRAPE HALF SHEET 40X57 (DRAPES) ×2 IMPLANT
DRAPE X-RAY CASS 24X20 (DRAPES) IMPLANT
DRSG EMULSION OIL 3X3 NADH (GAUZE/BANDAGES/DRESSINGS) ×1 IMPLANT
DRSG TEGADERM 4X4.75 (GAUZE/BANDAGES/DRESSINGS) ×3 IMPLANT
ELECT REM PT RETURN 9FT ADLT (ELECTROSURGICAL) ×2
ELECTRODE REM PT RTRN 9FT ADLT (ELECTROSURGICAL) ×1 IMPLANT
EVACUATOR SILICONE 100CC (DRAIN) IMPLANT
GAUZE 4X4 16PLY ~~LOC~~+RFID DBL (SPONGE) ×1 IMPLANT
GAUZE SPONGE 4X4 12PLY STRL (GAUZE/BANDAGES/DRESSINGS) ×2 IMPLANT
GLIDEWIRE ADV .035X180CM (WIRE) ×1 IMPLANT
GLOVE SURG POLYISO LF SZ7.5 (GLOVE) ×2 IMPLANT
GLOVE SURG SIGNA 7.5 PF LTX (GLOVE) ×3 IMPLANT
GLOVE SURG UNDER POLY LF SZ7.5 (GLOVE) ×2 IMPLANT
GOWN STRL REUS W/ TWL LRG LVL3 (GOWN DISPOSABLE) ×2 IMPLANT
GOWN STRL REUS W/ TWL XL LVL3 (GOWN DISPOSABLE) ×1 IMPLANT
GOWN STRL REUS W/TWL LRG LVL3 (GOWN DISPOSABLE) ×4
GOWN STRL REUS W/TWL XL LVL3 (GOWN DISPOSABLE) ×2
GRAFT VASC PATCH XENOSURE 1X14 (Vascular Products) ×1 IMPLANT
GUIDEWIRE ANGLED .035X150CM (WIRE) ×1 IMPLANT
HEMOSTAT SNOW SURGICEL 2X4 (HEMOSTASIS) IMPLANT
KIT BASIN OR (CUSTOM PROCEDURE TRAY) ×2 IMPLANT
KIT ENCORE 26 ADVANTAGE (KITS) ×1 IMPLANT
KIT MICROPUNCTURE NIT STIFF (SHEATH) ×1 IMPLANT
KIT TURNOVER KIT B (KITS) ×2 IMPLANT
NDL HYPO 25GX1X1/2 BEV (NEEDLE) IMPLANT
NEEDLE HYPO 25GX1X1/2 BEV (NEEDLE) IMPLANT
NS IRRIG 1000ML POUR BTL (IV SOLUTION) ×4 IMPLANT
PACK ENDO MINOR (CUSTOM PROCEDURE TRAY) ×2 IMPLANT
PACK GENERAL/GYN (CUSTOM PROCEDURE TRAY) ×2 IMPLANT
PACK PERIPHERAL VASCULAR (CUSTOM PROCEDURE TRAY) ×2 IMPLANT
PAD ARMBOARD 7.5X6 YLW CONV (MISCELLANEOUS) ×4 IMPLANT
SET COLLECT BLD 21X3/4 12 (NEEDLE) IMPLANT
SET MICROPUNCTURE 5F STIFF (MISCELLANEOUS) ×2 IMPLANT
SET WALTER ACTIVATION W/DRAPE (SET/KITS/TRAYS/PACK) IMPLANT
SHEATH BRITE TIP 8FR 23CM (SHEATH) ×1 IMPLANT
SHEATH PINNACLE 5F 10CM (SHEATH) ×2 IMPLANT
SHEATH PINNACLE 8F 10CM (SHEATH) ×2 IMPLANT
SHEATH PINNACLE ST 7F 45CM (SHEATH) ×1 IMPLANT
SHIELD RADPAD ABSORB 11X34 (MISCELLANEOUS) ×1 IMPLANT
SPONGE INTESTINAL PEANUT (DISPOSABLE) ×2 IMPLANT
SPONGE T-LAP 18X18 ~~LOC~~+RFID (SPONGE) ×3 IMPLANT
STENT EPIC VASCULAR 9X60X75 (Permanent Stent) ×1 IMPLANT
STENT VIABAHNBX 8X39X135 (Permanent Stent) ×1 IMPLANT
STOPCOCK 4 WAY LG BORE MALE ST (IV SETS) ×1 IMPLANT
STOPCOCK MORSE 400PSI 3WAY (MISCELLANEOUS) ×2 IMPLANT
SURGIFLO W/THROMBIN 8M KIT (HEMOSTASIS) ×2 IMPLANT
SUT ETHILON 3 0 PS 1 (SUTURE) ×3 IMPLANT
SUT PROLENE 5 0 C 1 24 (SUTURE) ×7 IMPLANT
SUT PROLENE 5 0 C 1 36 (SUTURE) ×2 IMPLANT
SUT PROLENE 6 0 BV (SUTURE) ×4 IMPLANT
SUT SILK 2 0 SH (SUTURE) ×2 IMPLANT
SUT VIC AB 2-0 CT1 27 (SUTURE) ×2
SUT VIC AB 2-0 CT1 TAPERPNT 27 (SUTURE) ×1 IMPLANT
SUT VIC AB 3-0 SH 27 (SUTURE) ×4
SUT VIC AB 3-0 SH 27X BRD (SUTURE) ×1 IMPLANT
SUT VIC AB 3-0 X1 27 (SUTURE) ×2 IMPLANT
SYR 3ML LL SCALE MARK (SYRINGE) ×1 IMPLANT
SYR CONTROL 10ML LL (SYRINGE) IMPLANT
SYR MEDRAD MARK V 150ML (SYRINGE) ×3 IMPLANT
TOWEL GREEN STERILE (TOWEL DISPOSABLE) ×4 IMPLANT
TUBING EXTENTION W/L.L. (IV SETS) IMPLANT
TUBING HIGH PRESSURE 120CM (CONNECTOR) ×2 IMPLANT
UNDERPAD 30X36 HEAVY ABSORB (UNDERPADS AND DIAPERS) ×2 IMPLANT
WATER STERILE IRR 1000ML POUR (IV SOLUTION) ×2 IMPLANT
WIRE AMPLATZ SS-J .035X180CM (WIRE) IMPLANT
WIRE AMPLATZ SS-J .035X260CM (WIRE) IMPLANT
WIRE BENTSON .035X145CM (WIRE) ×2 IMPLANT
WIRE HI TORQ VERSACORE J 260CM (WIRE) ×1 IMPLANT
WIRE STARTER BENTSON 035X150 (WIRE) ×1 IMPLANT
WIRE TORQFLEX AUST .018X40CM (WIRE) ×3 IMPLANT

## 2021-06-05 NOTE — Op Note (Signed)
Patient name: Todd Duran MRN: [id number] DOB: [birth year] Sex: male  06/05/2021 Pre-operative Diagnosis: Right fifth toe ulcer Post-operative diagnosis:  Same Surgeon:  [name] Assistants:  Adonis Housekeeper, PA Procedure:   #1: Right iliofemoral endarterectomy with bovine patch angioplasty   #2: Abdominal aortogram   #3: Stent, right common iliac artery   #4: Stent, right external iliac artery   #5: Right lower extremity angiogram    #6: Failed attempt at right lower extremity angioplasty   #7: Right posterior tibial artery ultrasound-guided access   #8: Right fifth toe amputation including metatarsal head Anesthesia: General Blood Loss: 300 cc Specimens: None  Findings: Extensive nearly occlusive plaque within the external iliac, common femoral and profundofemoral arteries on the right.  Extensive endarterectomy was performed and bovine pericardial patch was placed (approximately 8 cm in length.  Greater than 80% stenosis was identified in the right common and external iliac arteries.  These were successfully stented, using a 8 x 38 VBX in the common iliac artery and overlapping 9 x 60 epic stent in the external iliac artery.  I tried to cross the lesions within the superficial femoral artery which were heavily calcified but ultimately created a dissection plane and I could not reenter.  I tried to obtain pedal access however could not get the wire to advance because of how calcified the vessel was.  There was mild capillary bleeding at the toe amputation site.  If the patient has issues with wound healing, I would recommend right femoral-popliteal bypass graft  Indications: This is a 75 year old gentleman who presented with a nonsalvageable left leg.  He underwent left below-knee amputation which had to be converted to an above-knee amputation.  While in rehab, he has developed progressive ischemic changes to the right fifth toe where it is now nonviable.  He comes in today for right leg  revascularization  Procedure:  The patient was identified in the holding area and taken to [facility] OR ROOM 12  The patient was then placed supine on the table. general anesthesia was administered.  The patient was prepped and draped in the usual sterile fashion.  A time out was called and antibiotics were administered.  A PA was necessary to expedite the procedure and assist with technical details.  A longitudinal incision was made in the right groin.  Cautery was used divide subcutaneous tissue down to the femoral sheath which was opened sharply.  I dissected out the distal external iliac artery under the inguinal ligament and divided the crossing circumflex vein.  The common femoral artery and its branches were fully isolated.  I continued the dissection down onto the superficial femoral artery as well as the profundofemoral artery.  The vessels were heavily calcified circumferentially.  Once I had adequate exposure, the patient was fully heparinized.  After the heparin circulated, the vessels were occluded.  A #11 blade was used to make an arteriotomy which was extended longitudinally with Potts scissors up to the inguinal ligament and down onto the superficial femoral artery for approximately 1 cm.  A Madelaine Etienne elevator was used to perform endarterectomy.  I tacked the plaque and the superficial femoral artery at a good endpoint.  I established good flow into the profundofemoral artery.  In order to address the plaque in the distal external iliac artery, a #4 Fogarty balloon was inserted for proximal control.  The plaque was removed from the external iliac artery with a hemostat.  Once I was satisfied with the endarterectomy  and bovine pericardial patch was placed.  The patch length was approximately 8 cm.  The anastomosis was performed with a running 5-0 Prolene.  Prior to completion, I inserted a 8 French sheath into the external iliac artery.  I then gain wire access into the aorta and performed a  retrograde injection which showed the distal aorta and severely diseased common iliac artery up to 80% and greater than 80% stenosis extending into the external iliac artery.  I tried to advance the 8 [place] sheath into the aorta but could not and so I had to predilate the lesion with a 4 mm balloon.  Once this was done I advanced the sheath into the aorta.  A 8 x 39 VBX stent was then prepared and inserted.  It was deployed landing at the origin of the common iliac artery.  I then selected a 9 x 60 self-expanding epic stent with 1 cm overlap into the VBX stent.  This was postdilated with an 8 mm balloon.  Completion imaging revealed resolution of the stenosis within the iliac artery.  The sheath and wire were then removed.  I then completed the patch angioplasty.  Blood flow was reestablished.  There was an excellent pulse within the common femoral artery.  Attention was then turned towards the superficial femoral artery disease.  A micropuncture needle was used to access the common femoral artery patch.  An 018 wire was advanced into the superficial femoral artery and a micropuncture sheath was placed.  A contrast injection was performed for runoff of the right leg down to the knee.  The superficial femoral artery was heavily calcified and nearly occluded.  I then upsized to a 7 x 45 sheath which was inserted in the superficial femoral artery.  I then used a 035 Glidewire and quick cross catheter and tried to cross the lesions.  I had difficulty advancing the catheter and wire and ultimately ended up and a dissection plane.  I spent an excessive amount of time trying to cross the lesions, however ultimately could not get out of the dissection plane.  Because of the duration of the attempt, I elected to stop.  The sheath was then removed.  The hole in the patch was closed with 5-0 Prolene.  The wound was then copiously irrigated.  Protamine was administered (50 mg) hemostasis was achieved.  Once I was satisfied  with hemostasis, the femoral sheath was reapproximated 2-0 Vicryl.  Subcutaneous tissue was then closed with additional layers of 2-0 and 3-0 Vicryl followed by subcuticular closure.  Dermabond was applied to the incision.  Attention was then turned towards the right fifth toe.  A racquet type incision was made incorporating the base of the fifth toe.  The incision was taken down to the bone and a bone cutter was used to remove the fifth toe.  I used a rongeur to remove the metatarsal head and get back to healthy bone.  There was no obvious infection.  There was mild capillary bleeding.  I elected to reapproximate the tissue with interrupted 3-0 nylon vertical mattress sutures.  Sterile dressings were applied.  The patient was successfully extubated taken recovery in stable condition.   Disposition: To PACU stable.   [name], M.D., [facility] [place] Office: [phone] Pager:  [phone]

## 2021-06-05 NOTE — Transfer of Care (Signed)
Immediate Anesthesia Transfer of Care Note  Patient: Todd Duran  Procedure(s) Performed: RIGHT FEMORAL ENDARTERECTOMY (Right) INSERTION OF RIGHT ILIAC AND SUPERFICIAL FEMORAL ARTERY STENT (Right) RIGHT FIFTH TOE AMPUTATION (Right) PATCH ANGIOPLASTY OF RIGHT FEMORAL ARTERY USING XENOSURE BIOLOGIC PATCH (Right)  Patient Location: PACU  Anesthesia Type:General  Level of Consciousness: drowsy, patient cooperative and responds to stimulation  Airway & Oxygen Therapy: Patient Spontanous Breathing and Patient connected to face mask oxygen  Post-op Assessment: Report given to RN, Post -op Vital signs reviewed and stable and Patient moving all extremities X 4  Post vital signs: Reviewed and stable  Last Vitals:  Vitals Value Taken Time  BP 107/65   Temp    Pulse 92   Resp 14   SpO2 96     Last Pain:  Vitals:   06/05/21 1255  TempSrc:   PainSc: 0-No pain      Patients Stated Pain Goal: 0 (06/05/21 0750)  Complications: No notable events documented.

## 2021-06-05 NOTE — Progress Notes (Signed)
Physical Therapy Session Note  Patient Details  Name: Todd Duran MRN: [id number] Date of Birth: 07-Jan-1946  Today's Date: 06/05/2021 PT Individual Time:  -    PT Amount of Missed Time (min): 30 Minutes PT Missed Treatment Reason: Unavailable (Comment) (Pt prepping for surgery for additional amputation today)  Short Term Goals: Week 1:  PT Short Term Goal 1 (Week 1): Patient with perform basic transfers with min A using LRAD. PT Short Term Goal 1 - Progress (Week 1): Met PT Short Term Goal 2 (Week 1): Pt will ambulate x25' with minA and LRAD. PT Short Term Goal 2 - Progress (Week 1): Not met PT Short Term Goal 3 (Week 1): Patient with perform standing activities with mod A >5 min. PT Short Term Goal 3 - Progress (Week 1): Not met Week 2:  PT Short Term Goal 1 (Week 2): STGs = LTGs  Skilled Therapeutic Interventions/Progress Updates:    Pt on hold per PA and discharging to acute for RLE revascularization surgery.  Therapy Documentation Precautions:  Precautions Precautions: Fall Precaution Comments: L AKA Restrictions Weight Bearing Restrictions: Yes LLE Weight Bearing: Non weight bearing General: PT Amount of Missed Time (min): 30 Minutes PT Missed Treatment Reason: Unavailable (Comment) (Pt prepping for surgery for additional amputation today)  Pain: Pain Assessment Pain Score: Asleep   Therapy/Group: Individual Therapy  Carmeron Heady, SPT  06/05/2021, 12:00 PM

## 2021-06-05 NOTE — Progress Notes (Signed)
Patient was originally on 4W rehab. Upon arrival to PACU he had not been discharged from that unit appropriatly. Registration, Patient Placement were both in formed that he needed to be discharged from 4W and readmitted to PACU so that we were able to assign him to a bay, a monitor and release his orders. This was still in the process of being resolved when patient was discharged from [facility] to 4East bed 9. RN Lynden Ang received both telephonic and bedside report from this RN. She was in formed that all charting would need to be done under the new CSN number. RN Zeb Comfort contacted MD to have orders put in again for floor unit. Pt was in no distress, with stable vitals, on room air with no complaints of pain when transferred from PACU to 4East.  06/05/2021 @ 2144 Manson Allan, RN

## 2021-06-05 NOTE — Interval H&P Note (Signed)
History and Physical Interval Note:  06/05/2021 1:54 PM  DARRELL HAUK  has presented today for surgery, with the diagnosis of LOWER LIMB ISCHEMIA.  The various methods of treatment have been discussed with the patient and family. After consideration of risks, benefits and other options for treatment, the patient has consented to  Procedure(s): RIGHT FEMORAL ENDARTERECTOMY (Right) INSERTION OF RIGHT ILIAC AND SUPERFICIAL FEMORAL ARTERY STENT (Right) RIGHT FIFTH TOE AMPUTATION (Right) as a surgical intervention.  The patient's history has been reviewed, patient examined, no change in status, stable for surgery.  I have reviewed the patient's chart and labs.  Questions were answered to the patient's satisfaction.     [name]

## 2021-06-05 NOTE — Progress Notes (Signed)
Physical Therapy Session Note  Patient Details  Name: MOUA RASMUSSON MRN: [id number] Date of Birth: 05/24/1946  Today's Date: 06/05/2021 PT Missed Time: 9 Minutes Missed Time Reason: Unavailable (Comment) (Pt prepping for surgery for additional amputation today)  Short Term Goals: Week 1:  PT Short Term Goal 1 (Week 1): Patient with perform basic transfers with min A using LRAD. PT Short Term Goal 1 - Progress (Week 1): Met PT Short Term Goal 2 (Week 1): Pt will ambulate x25' with minA and LRAD. PT Short Term Goal 2 - Progress (Week 1): Not met PT Short Term Goal 3 (Week 1): Patient with perform standing activities with mod A >5 min. PT Short Term Goal 3 - Progress (Week 1): Not met Week 2:  PT Short Term Goal 1 (Week 2): STGs = LTGs  Skilled Therapeutic Interventions/Progress Updates:  Pt received supine in bed and informed pt he was leaving for surgery. Communicated with nursing regarding pt discharge and informed he would be discharging to acute within 30 minutes. Missed 30 minutes of therapy.   Therapy Documentation Precautions:  Precautions Precautions: Fall Precaution Comments: L AKA Restrictions Weight Bearing Restrictions: Yes LLE Weight Bearing: Non weight bearing   Therapy/Group: Individual Therapy Cruzita Lederer Javius Sylla, PT, DPT  06/05/2021, 7:49 AM

## 2021-06-05 NOTE — Progress Notes (Signed)
Patient ID: Todd Duran, male   DOB: [birth year], 75 y.o.   MRN: [id number]  Per medical team, pt will transition to acute hospital due to vascular issues. Pt is not likely to return to CIR.  SW updated Cassie/VA SW with [facility] [phone]), and [facility] on current changes.   SW made efforts to make contact with pt wife to inform on HHA, but unable to get through on her line, indicated not accepting calls at this time.  Cecile Sheerer, MSW, LCSWA Office: [phone] Cell: [phone] Fax: [phone]

## 2021-06-05 NOTE — Progress Notes (Signed)
Occupational Therapy Note  Patient Details  Name: Todd Duran MRN: [id number] Date of Birth: 1945-11-28  Today's Date: 06/05/2021 OT Missed Time: 60 Minutes Missed Time Reason:    Patient off the unit for surgery. OT to follow up as appropriate.   [name] 06/05/2021, 3:23 PM

## 2021-06-05 NOTE — Anesthesia Preprocedure Evaluation (Addendum)
Anesthesia Evaluation  Patient identified by MRN, date of birth, ID band Patient awake    Reviewed: Allergy & Precautions, H&P , NPO status , Patient's Chart, lab work & pertinent test results  History of Anesthesia Complications Negative for: history of anesthetic complications  Airway Mallampati: III  TM Distance: >3 FB Neck ROM: full    Dental  (+) Dental Advisory Given, Teeth Intact   Pulmonary neg shortness of breath, neg COPD, neg recent URI, Current Smoker and Patient abstained from smoking.,    breath sounds clear to auscultation       Cardiovascular hypertension, + Peripheral Vascular Disease   Rhythm:regular Rate:Normal     Neuro/Psych negative neurological ROS  negative psych ROS   GI/Hepatic   Endo/Other  diabetes, Type 2  Renal/GU Lab Results      Component                Value               Date                      CREATININE               0.81                05/28/2021                Musculoskeletal   Abdominal   Peds  Hematology  (+) anemia ,   Anesthesia Other Findings   Reproductive/Obstetrics                            Anesthesia Physical Anesthesia Plan  ASA: 3  Anesthesia Plan: General   Post-op Pain Management:    Induction: Intravenous  PONV Risk Score and Plan: 1 and Ondansetron, Dexamethasone and Treatment may vary due to age or medical condition  Airway Management Planned: Oral ETT  Additional Equipment: Arterial line  Intra-op Plan:   Post-operative Plan: Extubation in OR  Informed Consent: I have reviewed the patients History and Physical, chart, labs and discussed the procedure including the risks, benefits and alternatives for the proposed anesthesia with the patient or authorized representative who has indicated his/her understanding and acceptance.     Dental advisory given  Plan Discussed with: CRNA, Anesthesiologist and  Surgeon  Anesthesia Plan Comments:         Anesthesia Quick Evaluation

## 2021-06-05 NOTE — Discharge Summary (Signed)
Physician Discharge Summary  Patient ID: Todd Duran MRN: [id number] DOB/AGE: [birth year] 75 y.o.  Admit date: 05/25/2021 Discharge date: 06/05/2021  Discharge Diagnoses:  Principal Problem:   Left above-knee amputee [facility]) DVT prophylaxis Ischemic right toe Pain management Acute blood loss anemia Hypertension Hyperlipidemia Diabetes mellitus with peripheral neuropathy History of alcohol tobacco use Constipation  Discharged Condition: Stable  Significant Diagnostic Studies: PERIPHERAL VASCULAR CATHETERIZATION  Result Date: 05/12/2021 Images from the original result were not included. Patient name: Todd Duran MRN: [id number] DOB: 1946-08-02 Sex: male 05/12/2021 Pre-operative Diagnosis: Ischemic left foot Post-operative diagnosis:  Same Surgeon:  [name] Procedure Performed:  1.  Ultrasound-guided access, left common femoral artery  2.  Abdominal aortogram  3.  Bilateral lower extremity runoff  4.  Conscious sedation, 50 minutes Indications: This is a 75 year old gentleman with ischemic left foot and severe disease on the right who comes in today for angiographic evaluation Procedure:  The patient was identified in the holding area and taken to room 8.  The patient was then placed supine on the table and prepped and draped in the usual sterile fashion.  A time out was called.  Conscious sedation was administered with the use of IV fentanyl and Versed under continuous physician and nurse monitoring.  Heart rate, blood pressure, and oxygen saturation were continuously monitored.  Total sedation time was 50 minutes.  Ultrasound was used to evaluate the left common femoral artery.  It was patent .  A digital ultrasound image was acquired.  A micropuncture needle was used to access the left common femoral artery under ultrasound guidance.  An 018 wire was advanced without resistance and a micropuncture sheath was placed.  The 018 wire was removed and a benson wire was placed.  The  micropuncture sheath was exchanged for a 5 french sheath.  An omniflush catheter was advanced over the wire to the level of L-1.  An abdominal angiogram was obtained.  Next, the catheter was pulled down to the aortic bifurcation and bilateral runoff was performed Findings:  Aortogram: Bilateral renal artery stenosis.  The infrarenal abdominal aorta is calcified but patent throughout its course.  Moderate stenosis is visualized at the origin of the left common iliac artery.  The external iliac artery is calcified but patent throughout its course.  There is a 90% right iliac stenosis  Right Lower Extremity: There is a subtotal occlusion with heavy calcification within the right common femoral artery.  The profundofemoral artery is patent throughout its course.  The superficial femoral artery is also patent however there is severe stenosis within the adductor canal.  The popliteal artery is patent however there is limited visualization of the tibial vessels secondary to proximal disease.  There does appear to be two-vessel runoff via the posterior tibial and peroneal artery.  Left Lower Extremity: The left common femoral artery is occluded.  There is reconstitution of the profundofemoral artery.  The superficial femoral artery is patent down the adductor canal where it occludes.  There is reconstitution of the above-knee popliteal artery with three-vessel runoff via very diseased vessels. Intervention: None Impression:  #1  Left common femoral artery occlusion.  Left superficial femoral artery occlusion at the adductor canal with popliteal reconstitution and three-vessel runoff  #2  Right iliac stenosis, 90%  #3  Severe right common femoral stenosis along with severe right superficial femoral artery stenosis  #4  The patient will require left below-knee amputation.  At a later date he will be considered for right  femoral endarterectomy and iliac stenting, with possible superficial femoral artery stenting.  [name], M.D., FACS [facility] Office: [phone] Pager:  [phone]   VAS [place] ABI WITH/WO TBI  Result Date: 05/08/2021  LOWER EXTREMITY DOPPLER STUDY Patient Name:  Todd Duran  Date of Exam:   05/08/2021 Medical Rec #: [id number]        Accession #:    [id number] Date of Birth: Jan 21, 1946        Patient Gender: M Patient Age:   75Y Exam Location:  [facility] Procedure:      VAS [place] ABI WITH/WO TBI Referring Phys: --------------------------------------------------------------------------------  Indications: Ulceration, and gangrene involving the left foot and toes.  Performing Technologist: Dorthula Matas RVS, RCS  Examination Guidelines: A complete evaluation includes at minimum, Doppler waveform signals and systolic blood pressure reading at the level of bilateral brachial, anterior tibial, and posterior tibial arteries, when vessel segments are accessible. Bilateral testing is considered an integral part of a complete examination. Photoelectric Plethysmograph (PPG) waveforms and toe systolic pressure readings are included as required and additional duplex testing as needed. Limited examinations for reoccurring indications may be performed as noted.  ABI Findings: +---------+------------------+-----+--------+--------+ Right    Rt Pressure (mmHg)IndexWaveformComment  +---------+------------------+-----+--------+--------+ Brachial 140                                     +---------+------------------+-----+--------+--------+ PTA                             absent           +---------+------------------+-----+--------+--------+ DP                              absent           +---------+------------------+-----+--------+--------+ Great Toe                       Absent           +---------+------------------+-----+--------+--------+ +---------+------------------+-----+--------+-----------------------------+ Left     Lt Pressure  (mmHg)IndexWaveformComment                       +---------+------------------+-----+--------+-----------------------------+ Brachial 144                                                          +---------+------------------+-----+--------+-----------------------------+ PTA                             absent                                +---------+------------------+-----+--------+-----------------------------+ DP                              absent                                +---------+------------------+-----+--------+-----------------------------+ Great Toe  unable to obtain due to ulcer +---------+------------------+-----+--------+-----------------------------+  Summary: Right: Absent pedal pulses. Left: Absent pedal pulses.  *See table(s) above for measurements and observations.  Electronically signed by Lemar Livings MD on 05/08/2021 at 4:42:13 PM.    Final    DG HIP UNILAT WITH PELVIS 2-3 VIEWS LEFT  Result Date: 05/15/2021 CLINICAL DATA:  Recent fall with left hip pain, initial encounter EXAM: DG HIP (WITH OR WITHOUT PELVIS) 3V LEFT COMPARISON:  MRI from 04/13/2021 FINDINGS: Proximal femur appears within normal limits. Mild degenerative changes of the hip joint are seen. Pelvic ring as visualized is intact. No gross soft tissue abnormality is seen. Diffuse vascular calcifications are noted. IMPRESSION: Degenerative changes without acute abnormality. Electronically Signed   By: Alcide Clever M.D.   On: 05/15/2021 21:24    Labs:  Basic Metabolic Panel: No results for input(s): NA, K, CL, CO2, GLUCOSE, BUN, CREATININE, CALCIUM, MG, PHOS in the last 168 hours.  CBC: Recent Labs  Lab 06/01/21 0701 06/02/21 0508 06/03/21 0608 06/05/21 0555  WBC 7.8  --   --  7.4  HGB 7.2* 7.1* 7.7* 7.1*  HCT 23.3* 22.5* 24.3* 22.7*  MCV 86.9  --   --  86.0  PLT 560*  --   --  459*    CBG: Recent Labs  Lab 06/04/21 0622 06/04/21 1209  06/04/21 1631 06/04/21 2137 06/05/21 0603  GLUCAP 113* 104* 140* 155* 112*    Brief HPI:   Todd Duran is a 75 y.o. right-handed male with history of hypertension diabetes mellitus tobacco alcohol use.  Per chart review lives with spouse independent prior to admission.  Presented 05/13/2021 with progressive left foot pain x2 months with ischemic changes and findings of decreased femoral pulses bilaterally.  Underwent ultrasound-guided left femoral artery and abdominal aortogram showing bilateral renal artery stenosis.  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 Myra Gianotti.  [facility] course anemia mild hyponatremia 130-131.  He was cleared to begin subcutaneous heparin for DVT prophylaxis.  Therapy evaluations completed he was admitted to inpatient rehab services 05/15/2021.  Patient with slow progressive gains pain management as directed.  Close monitoring of BKA site noted boggy necrotic area concerning along incision line as well as leukocytosis with vascular surgery consulted Staples are removed underlying abscess poor healing and increased necrosis.  Due to these ischemic changes patient was discharged to acute care services 05/20/2021 underwent left AKA per Dr. Chestine Spore.  Postoperative course anemia transfused 2 units packed red blood cells.  He remained on subcutaneous heparin for DVT prophylaxis.  He again was admitted to inpatient rehab services 05/25/2021 to resume comprehensive inpatient therapies.   Hospital Course: Todd Duran was admitted to rehab 05/25/2021 for inpatient therapies to consist of PT, ST and OT at least three hours five days a week. Past admission physiatrist, therapy team and rehab RN have worked together to provide customized collaborative inpatient rehab.  Pertaining to patient's left AKA 05/20/2021 healing nicely followed by vascular surgery he did have a necrotic ischemic right toe  followed by vascular surgery who felt that amputation was imminent as well as revascularization he was to be discharged to acute care services 06/05/2021 for procedure.  While on rehab services pain management ongoing with use of oxycodone as well as scheduled Neurontin.  Voltaren for right foot.  Acute blood loss anemia stable latest hemoglobin 7.1.  Blood pressure controlled and monitored maintained on  Norvasc.  Lipitor ongoing for hyperlipidemia.  History of diabetes mellitus hemoglobin A1c 5.2 low-dose Glucophage as directed.  He did have a history of tobacco alcohol use receiving counsel regards to cessation of these products.   Blood pressures were monitored on TID basis and soft and monitored  Diabetes has been monitored with ac/hs CBG checks and SSI was use prn for tighter BS control.    Rehab course: During patient's stay in rehab weekly team conferences were held to monitor patient's progress, set goals and discuss barriers to discharge. At admission, patient required +2 physical assist sit to stand minimal assist sit to supine set up upper body bathing moderate assist lower body bathing  Physical exam.  Blood pressure 124/74 pulse 85 temperature 98 respirations 18 oxygen saturations 99% room air Constitutional.  No acute distress HEENT Head.  Multiple missing teeth Eyes.  Pupils round and reactive to light no discharge without nystagmus Neck.  Supple nontender no JVD without thyromegaly Cardiac regular rate rhythm not extra sounds or murmur heard Abdomen.  Soft nontender positive bowel sounds without rebound Respiratory effort normal no respiratory distress without wheeze Skin.  Left AKA site dressed Staples intact.  Ischemic changes right fifth toe  He/She  has had improvement in activity tolerance, balance, postural control as well as ability to compensate for deficits. He/She has had improvement in functional use RUE/LUE  and RLE/LLE as well as improvement in awareness.  Patient  progressing slowly towards mobility goals improving independence as well as endurance.  He did have some anxiety in regards to risk of falls.  Squat pivot from bed to wheelchair minimal assist.  Patient was assist with pulling up his pants stand pivot transfer to wheelchair.  Patient then transfers to mat table with contact-guard.  Patient provided with extended education on body mechanics sequencing and safety.  He was very limited in standing tolerance.       Disposition: Discharged to acute care services    Diet: Diabetic diet  Special Instructions: No driving smoking or alcohol  Medications at time of dictation 1.  Norvasc 10 mg p.o. daily 2.  Aspirin 81 mg p.o. daily 3.  Tenormin 25 mg p.o. twice daily 4.  Lipitor 20 mg p.o. daily 5.  Voltaren gel 2 g 3 times daily 6.  Ferrous sulfate 325 mg daily 7.  Folic acid 1 mg p.o. daily 8.  Neurontin 100 mg p.o. twice daily 9.  Glucophage 500 mg p.o. daily 10.  Multivitamin daily 11.  Ditropan 2.5 mg p.o. twice daily 12.  Oxycodone as needed pain 13.  Thiamine 100 mg p.o. daily  30-35 minutes were spent completing discharge summary and discharge planning   Discharge Instructions     Ambulatory referral to Physical Medicine Rehab   Complete by: As directed    Moderate complexity follow-up 1 to 2 weeks left AKA        Follow-up Information     Ranelle OysterSwartz, Zachary T, MD Follow up.   Specialty: Physical Medicine and Rehabilitation Why: Office to call for appointment Contact information: [address] [place] [address] [phone]         Cephus Shellinglark, Christopher J, MD Follow up.   Specialty: Vascular Surgery Why: Call for appointment Contact information: [address] [place] [address] [phone]                 Signed: Charlton AmorDaniel J Jahson Emanuele 06/05/2021, 8:47 AM

## 2021-06-05 NOTE — Anesthesia Procedure Notes (Signed)
Procedure Name: Intubation Date/Time: 06/05/2021 2:34 PM Performed by: Lovie Chol, CRNA Pre-anesthesia Checklist: Patient identified, Emergency Drugs available, Suction available and Patient being monitored Patient Re-evaluated:Patient Re-evaluated prior to induction Oxygen Delivery Method: Circle System Utilized Preoxygenation: Pre-oxygenation with 100% oxygen Induction Type: IV induction Ventilation: Mask ventilation without difficulty Laryngoscope Size: Miller and 3 Grade View: Grade I Tube type: Oral Tube size: 7.5 mm Number of attempts: 1 Airway Equipment and Method: Stylet and Oral airway Placement Confirmation: ETT inserted through vocal cords under direct vision, positive ETCO2 and breath sounds checked- equal and bilateral Secured at: 22 cm Tube secured with: Tape Dental Injury: Teeth and Oropharynx as per pre-operative assessment

## 2021-06-05 NOTE — Op Note (Deleted)
  The note originally documented on this encounter has been moved the the encounter in which it belongs.  

## 2021-06-05 NOTE — Progress Notes (Signed)
Inpatient Rehabilitation Admissions Coordinator  Patient discharged from [facility] for surgery. We will follow up next week as [facility] read[facility] to [facility] once PT and OT evals completed postoperatively.    Ottie Glazier, RN, MSN Rehab Admissions Coordinator [phone] 06/05/2021 1:15 PM

## 2021-06-05 NOTE — Anesthesia Postprocedure Evaluation (Signed)
Anesthesia Post Note  Patient: Todd Duran  Procedure(s) Performed: RIGHT FEMORAL ENDARTERECTOMY (Right) INSERTION OF RIGHT ILIAC AND SUPERFICIAL FEMORAL ARTERY STENT (Right) RIGHT FIFTH TOE AMPUTATION (Right) PATCH ANGIOPLASTY OF RIGHT FEMORAL ARTERY USING XENOSURE BIOLOGIC PATCH (Right)     Patient location during evaluation: PACU Anesthesia Type: General Level of consciousness: awake and alert and patient cooperative Pain management: pain level controlled Vital Signs Assessment: post-procedure vital signs reviewed and stable Respiratory status: spontaneous breathing, nonlabored ventilation, respiratory function stable and patient connected to nasal cannula oxygen Cardiovascular status: blood pressure returned to baseline and stable Postop Assessment: no apparent nausea or vomiting Anesthetic complications: no   No notable events documented.  Last Vitals:  Vitals:   06/05/21 2050 06/05/21 2105  BP: 106/66 121/64  Pulse: 84 84  Resp: 10 13  Temp: 36.6 C 36.6 C  SpO2: 100% 100%    Last Pain:  Vitals:   06/05/21 2105  TempSrc:   PainSc: 0-No pain                 Bernis Schreur,E. Chrissy Ealey

## 2021-06-05 NOTE — Progress Notes (Signed)
PROGRESS NOTE   Subjective/Complaints: Spoke with Dr. Myra Gianotti, notes reviewed. Pt to OR today for toe amp and re-vascularization. Pt a little disappointed as he's concerned it will delay his discharge.   ROS: Patient denies fever, rash, sore throat, blurred vision, nausea, vomiting, diarrhea, cough, shortness of breath or chest pain,  headache, or mood change.   Objective:   No results found. Recent Labs    06/03/21 0608 06/05/21 0555  WBC  --  7.4  HGB 7.7* 7.1*  HCT 24.3* 22.7*  PLT  --  459*   No results for input(s): NA, K, CL, CO2, GLUCOSE, BUN, CREATININE, CALCIUM in the last 72 hours.  Intake/Output Summary (Last 24 hours) at 06/05/2021 1047 Last data filed at 06/05/2021 0710 Gross per 24 hour  Intake 360 ml  Output 1375 ml  Net -1015 ml        Physical Exam: Vital Signs Blood pressure 136/72, pulse 82, temperature 98.2 F (36.8 C), temperature source Oral, resp. rate 16, height 5\' 11"  (1.803 m), weight 55.4 kg, SpO2 100 %.  Constitutional: No distress . Vital signs reviewed. HEENT: EOMI, oral membranes moist Neck: supple Cardiovascular: RRR without murmur. No JVD    Respiratory/Chest: CTA Bilaterally without wheezes or rales. Normal effort    GI/Abdomen: BS +, non-tender, non-distended Ext: no clubbing, cyanosis, or edema Psych: pleasant and cooperative  Skin: left AKA well approximated, shrinker in place. Right 5th toe with wound ongoing Neuro: Pt is cognitively appropriate with normal insight, memory, and awareness. Cranial nerves 2-12 are intact. Sensory exam absent LT below the Right knee  Reflexes are 2+ in all 4's. Fine motor coordination is intact. No tremors. Motor function is grossly 5/5 UE. LLE 3/5. RLE 4-5/5.   Musculoskeletal: low back mildly TTP. Stump with minimal tenderness also, swelling down  -mild ankle, foot pain   Assessment/Plan: 1. Functional deficits which require 3+ hours per  day of interdisciplinary therapy in a comprehensive inpatient rehab setting. Physiatrist is providing close team supervision and 24 hour management of active medical problems listed below. Physiatrist and rehab team continue to assess barriers to discharge/monitor patient progress toward functional and medical goals  Care Tool:  Bathing    Body parts bathed by patient: Left arm, Right arm, Chest, Abdomen, Front perineal area, Right upper leg, Left upper leg, Right lower leg, Face   Body parts bathed by helper: Front perineal area, Buttocks Body parts n/a: Left lower leg   Bathing assist Assist Level: Supervision/Verbal cueing     Upper Body Dressing/Undressing Upper body dressing   What is the patient wearing?: Pull over shirt    Upper body assist Assist Level: Set up assist    Lower Body Dressing/Undressing Lower body dressing      What is the patient wearing?: Pants     Lower body assist Assist for lower body dressing: Moderate Assistance - Patient 50 - 74%     Toileting Toileting Toileting Activity did not occur (Clothing management and hygiene only): N/A (no void or bm)  Toileting assist Assist for toileting: Supervision/Verbal cueing     Transfers Chair/bed transfer  Transfers assist     Chair/bed transfer assist level:  Minimal Assistance - Patient > 75% (stand pivot with RW)     Locomotion Ambulation   Ambulation assist   Ambulation activity did not occur: Safety/medical concerns  Assist level: Minimal Assistance - Patient > 75% Assistive device: Parallel bars Max distance: 5'   Walk 10 feet activity   Assist  Walk 10 feet activity did not occur: Safety/medical concerns        Walk 50 feet activity   Assist Walk 50 feet with 2 turns activity did not occur: Safety/medical concerns         Walk 150 feet activity   Assist Walk 150 feet activity did not occur: Safety/medical concerns         Walk 10 feet on uneven surface   activity   Assist Walk 10 feet on uneven surfaces activity did not occur: Safety/medical concerns         Wheelchair     Assist Will patient use wheelchair at discharge?: Yes Type of Wheelchair: Manual    Wheelchair assist level: Supervision/Verbal cueing Max wheelchair distance: 400    Wheelchair 50 feet with 2 turns activity    Assist        Assist Level: Supervision/Verbal cueing   Wheelchair 150 feet activity     Assist      Assist Level: Supervision/Verbal cueing   Blood pressure 136/72, pulse 82, temperature 98.2 F (36.8 C), temperature source Oral, resp. rate 16, height 5\' 11"  (1.803 m), weight 55.4 kg, SpO2 100 %.   Medical Problem List and Plan: 1.  Debility secondary to left BKA 05/13/2021 with necrotic changes revised to left AKA 05/20/2021             -patient may  shower if left leg is covered  -pt to OR today for 5th toe amp and revascularization--discharge to acute/Dr. 05/22/2021.  -Needs therapy re-eval post-op to assess his needs. Might bring him back to rehab depending upon his functional needs post-op. Will need re-authorization from Myra Gianotti?     2.  Antithrombotics: -DVT/anticoagulation: Subcutaneous heparin             -antiplatelet therapy: Aspirin 81 mg daily 3. Pain Management: Oxycodone as needed -Peripheral neuropathy pain RIght foot added gabapentin              -massage and elevation to control pain/edema as well.  -oob to chair for LB pain.   -add voltaren gel to right foot 4. Mood: Provide emotional support             -antipsychotic agents: N/A 5. Neuropsych: This patient is capable of making decisions on his own behalf. 6. Skin/Wound Care: Routine skin checks             - shrinker per [employer] 7. Fluids/Electrolytes/Nutrition:    -BUN/Cr improved 7/21. Continue encourage fluids 8.  Acute blood loss anemia.  Transfused 2 units 05/22/2021.      -stool OB positive Tuesday but last HGB sl improved to 7.7   -Hgb 7.1  7/29---probably will drop further peri-operatively   -blood transfusion per surgery if needed    9.  Hypertension.  Norvasc 10 mg daily, Tenormin 25 mg twice daily.     -normotensive at present 10.  Hyperlipidemia.  Lipitor 11.  Diabetes mellitus.  Hemoglobin A1c 5.2.  SSI.  Patient on Glucophage 500 mg daily prior to admission.   -CBG (last 3)  Recent Labs    06/04/21 1631 06/04/21 2137 06/05/21 0603  GLUCAP 140* 155* 112*   -  resumed glucophage 500mg  7/21 with reasonable results 12.  History of tobacco alcohol use.  Provide counseling 13.  Hyponatremia.  mild 133, same 7/21 14. SLow transit constipation:  Responded to sorbitol-- -holding colace for now    LOS: 11 days A FACE TO FACE EVALUATION WAS PERFORMED  8/21 06/05/2021, 10:47 AM

## 2021-06-05 NOTE — Anesthesia Procedure Notes (Signed)
Arterial Line Insertion Start/End7/29/2022 12:00 PM, 06/05/2021 12:10 PM Performed by: Lonia Mad, CRNA, CRNA  Patient location: Pre-op. Preanesthetic checklist: patient identified, IV checked, site marked, risks and benefits discussed, surgical consent, monitors and equipment checked, pre-op evaluation and timeout performed Lidocaine 1% used for infiltration Left, radial was placed Catheter size: 20 G Hand hygiene performed  and maximum sterile barriers used   Attempts: 1 Procedure performed without using ultrasound guided technique. Following insertion, dressing applied and Biopatch. Post procedure assessment: normal  Patient tolerated the procedure well with no immediate complications.

## 2021-06-06 ENCOUNTER — Encounter (HOSPITAL_COMMUNITY): Payer: Self-pay | Admitting: Vascular Surgery

## 2021-06-06 ENCOUNTER — Other Ambulatory Visit: Payer: Self-pay

## 2021-06-06 LAB — TYPE AND SCREEN
ABO/RH(D): O POS
Antibody Screen: NEGATIVE
Unit division: 0
Unit division: 0
Unit division: 0

## 2021-06-06 LAB — GLUCOSE, CAPILLARY
Glucose-Capillary: 126 mg/dL — ABNORMAL HIGH (ref 70–99)
Glucose-Capillary: 151 mg/dL — ABNORMAL HIGH (ref 70–99)
Glucose-Capillary: 148 mg/dL — ABNORMAL HIGH (ref 70–99)
Glucose-Capillary: 129 mg/dL — ABNORMAL HIGH (ref 70–99)

## 2021-06-06 LAB — BPAM RBC
Blood Product Expiration Date: 202208262359
Blood Product Expiration Date: 202208272359
Blood Product Expiration Date: 202208272359
ISSUE DATE / TIME: 202207291343
ISSUE DATE / TIME: 202207291343
ISSUE DATE / TIME: 202207291847
Unit Type and Rh: 5100
Unit Type and Rh: 5100
Unit Type and Rh: 5100

## 2021-06-06 LAB — BASIC METABOLIC PANEL
Anion gap: 8 (ref 5–15)
BUN: 17 mg/dL (ref 8–23)
CO2: 22 mmol/L (ref 22–32)
Calcium: 9.1 mg/dL (ref 8.9–10.3)
Chloride: 101 mmol/L (ref 98–111)
Creatinine, Ser: 1.04 mg/dL (ref 0.61–1.24)
GFR, Estimated: >60 mL/min (ref >60)
Glucose, Bld: 179 mg/dL — ABNORMAL HIGH (ref 70–99)
Potassium: 5.4 mmol/L — ABNORMAL HIGH (ref 3.5–5.1)
Sodium: 131 mmol/L — ABNORMAL LOW (ref 135–145)

## 2021-06-06 LAB — CBC
HCT: 24.5 % — ABNORMAL LOW (ref 39.0–52.0)
Hemoglobin: 8.2 g/dL — ABNORMAL LOW (ref 13.0–17.0)
MCH: 27.7 pg (ref 26.0–34.0)
MCHC: 33.5 g/dL (ref 30.0–36.0)
MCV: 82.8 fL (ref 80.0–100.0)
Platelets: 376 10*3/uL (ref 150–400)
RBC: 2.96 MIL/uL — ABNORMAL LOW (ref 4.22–5.81)
RDW: 17.3 % — ABNORMAL HIGH (ref 11.5–15.5)
WBC: 9.4 10*3/uL (ref 4.0–10.5)
nRBC: 0 % (ref 0.0–0.2)

## 2021-06-06 LAB — LIPID PANEL
Cholesterol: 110 mg/dL (ref 0–200)
HDL: 27 mg/dL — ABNORMAL LOW (ref >40)
LDL Cholesterol: 70 mg/dL (ref 0–99)
Total CHOL/HDL Ratio: 4.1 RATIO
Triglycerides: 66 mg/dL (ref <150)
VLDL: 13 mg/dL (ref 0–40)

## 2021-06-06 MED ORDER — ATORVASTATIN CALCIUM 40 MG PO TABS
40.0000 mg | ORAL_TABLET | Freq: Every day | ORAL | Status: DC
  Administered 2021-06-06 – 2021-06-22 (×16): 40 mg via ORAL
  Filled 2021-06-06 (×16): qty 1

## 2021-06-06 NOTE — Progress Notes (Signed)
PHARMACIST LIPID MONITORING   Todd Duran is a 75 y.o. male admitted on 06/05/2021 with limb ischemia.  Pharmacy has been consulted to optimize lipid-lowering therapy with the indication of secondary prevention for clinical ASCVD.  Recent Labs:  Lipid Panel (last 6 months):   Lab Results  Component Value Date   CHOL 110 06/06/2021   TRIG 66 06/06/2021   HDL 27 (L) 06/06/2021   CHOLHDL 4.1 06/06/2021   VLDL 13 06/06/2021   LDLCALC 70 06/06/2021    Hepatic function panel (last 6 months):   Lab Results  Component Value Date   AST 31 05/26/2021   ALT 24 05/26/2021   ALKPHOS 311 (H) 05/26/2021   BILITOT 0.5 05/26/2021    SCr (since admission):   Serum creatinine: 1.04 mg/dL 28/76/81 1572 Estimated creatinine clearance: 50.3 mL/min  Current therapy and lipid therapy tolerance Current lipid-lowering therapy: Lipitor 20 mg daily Documented or reported allergies or intolerances to lipid-lowering therapies (if applicable): none  Assessment:   75 yo male presents with limb ischemia with PMH significant for HTN and DM with LDL of 70 on Lipitor 20 mg daily.Patient agrees with changes to lipid-lowering therapy.  Plan:    1.Statin intensity (high intensity recommended for all patients regardless of the LDL):  Add or increase statin to high intensity. Increase Lipitor to 40 mg daily.  2.Add ezetimibe (if any one of the following):   Not indicated at this time.  3.Refer to lipid clinic:   No  4.Follow-up with:  Vascular Surgery - appt scheduled for 8/15  5.Follow-up labs after discharge:  Changes in lipid therapy were made. Check a lipid panel in 8-12 weeks then annually.     Filbert Schilder, PharmD PGY1 Pharmacy Resident 06/06/2021  11:31 AM  Please check [email or web address] for unit-specific pharmacy phone numbers.

## 2021-06-06 NOTE — Evaluation (Signed)
Occupational Therapy Evaluation Patient Details Name: Todd Duran MRN: [id number] DOB: Jan 20, 1946 Today's Date: 06/06/2021    History of Present Illness Pt is a 75 yo male admitted on 7/29 from CIR due to R lower limb ischemia. Pt underwent R femoral endarterectomy, R iliac & superficial fem art stent, R 5th toe amp. PSH: L AKA PMH: DM, HTN   Clinical Impression   Pt admitted from CIR (was previously independent prior to initial admission). Today was mod A for LB ADL, mod A for lateral scoot transfer to L to return supine with mod A (assist with LLE), perseverating on going home (see cognition section), Pt will require return to CIR to maximize safety and independence as well as education with DME/AE. INitiated education for desensitization with residual limb to address phantom limb pain. OT will continue to follow acutely to work on balance, cognition, ADL prior to return to CIR.    Follow Up Recommendations  CIR    Equipment Recommendations  Tub/shower bench;Other (comment);3 in 1 bedside commode;Wheelchair (measurements OT);Wheelchair cushion (measurements OT) (rolling walker)    Recommendations for Other Services Rehab consult     Precautions / Restrictions Precautions Precautions: Fall Precaution Comments: L aka, R 5th Required Braces or Orthoses: Other Brace Other Brace: R darco shoe Restrictions Weight Bearing Restrictions: Yes RLE Weight Bearing: Weight bearing as tolerated LLE Weight Bearing: Non weight bearing      Mobility Bed Mobility Overal bed mobility: Needs Assistance Bed Mobility: Sit to Supine     Supine to sit: Min assist Sit to supine: Mod assist   General bed mobility comments: max directional verbal cues, increased time, mod A to bring LLE back into bed, assist to scoot for positioning in bed    Transfers Overall transfer level: Needs assistance Equipment used: Rolling walker (2 wheeled) Transfers: Lateral/Scoot Transfers           Lateral/Scoot Transfers: Mod assist (going to L) General transfer comment: Transferred to the L side from recliner > bed, multimodal cues and mod A with gait belt and bed pad    Balance Overall balance assessment: Needs assistance Sitting-balance support: Feet supported;No upper extremity supported Sitting balance-Leahy Scale: Fair Sitting balance - Comments: reliant on UE support, pt restless and tending to lean multiple directions but no overt LOB with min guard/Supervision Postural control: Posterior lean Standing balance support: Bilateral upper extremity supported Standing balance-Leahy Scale: Poor Standing balance comment: unable                           ADL either performed or assessed with clinical judgement   ADL Overall ADL's : Needs assistance/impaired Eating/Feeding: Set up;Sitting   Grooming: Sitting;Wash/dry hands;Wash/dry face;Min guard Grooming Details (indicate cue type and reason): in recliner Upper Body Bathing: Sitting;Min guard   Lower Body Bathing: Sitting/lateral leans;Maximal assistance Lower Body Bathing Details (indicate cue type and reason): unable to access LLE foot at this time, can access down to knee Upper Body Dressing : Set up;Sitting   Lower Body Dressing: Sitting/lateral leans;Maximal assistance Lower Body Dressing Details (indicate cue type and reason): unable to access LLE foot at this time, can access down to knee Toilet Transfer: Moderate assistance;Stand-pivot;Cueing for safety;Cueing for sequencing;BSC;RW Toilet Transfer Details (indicate cue type and reason): to recliner, bed height elevated and mod A to power up to standing. patient able to pivot and take small hop on R foot however poor eccentric control into chair needing heavy mod A to  safely sit into recliner Toileting- Clothing Manipulation and Hygiene: Maximal assistance;Sitting/lateral lean       Functional mobility during ADLs: Moderate assistance;Cueing for  safety;Cueing for sequencing;Rolling walker General ADL Comments: increased time required, multimodal cues, balance deficits, decreased access to LB ADL     Vision Baseline Vision/History: Wears glasses Wears Glasses: At all times Patient Visual Report: No change from baseline Vision Assessment?: No apparent visual deficits     Perception     Praxis      Pertinent Vitals/Pain Pain Assessment: 0-10 Pain Score: 8  (8/10 R foot, 5/10 L residual limb) Faces Pain Scale: Hurts even more Pain Location: L limb and R foot Pain Descriptors / Indicators: Grimacing;Guarding;Discomfort;Pins and needles Pain Intervention(s): Limited activity within patient's tolerance;Monitored during session;Repositioned;Other (comment) (desensitization techniques for residual limb)     Hand Dominance Right   Extremity/Trunk Assessment Upper Extremity Assessment Upper Extremity Assessment: Overall WFL for tasks assessed   Lower Extremity Assessment Lower Extremity Assessment: Defer to PT evaluation RLE Deficits / Details: pt able to initiate SLR in bed, would not do ankle pump due to tightness and pain LLE Deficits / Details: pt with good hip ROM in all directions   Cervical / Trunk Assessment Cervical / Trunk Assessment: Normal   Communication Communication Communication: HOH   Cognition Arousal/Alertness: Awake/alert Behavior During Therapy: WFL for tasks assessed/performed;Anxious Overall Cognitive Status: Impaired/Different from baseline Area of Impairment: Safety/judgement;Awareness;Problem solving;Memory                     Memory: Decreased short-term memory Following Commands: Follows one step commands with increased time Safety/Judgement: Decreased awareness of safety;Decreased awareness of deficits Awareness: Intellectual Problem Solving: Slow processing;Decreased initiation;Requires verbal cues General Comments: Pt perseverating on going "home" OT relayed that plan was to go  CIR, Pt continued to perseverate on home. Could not problem solve how he would function with new AKA and now darco shoe post-op. Required assist to dial out to call wife   General Comments  VSS    Exercises Other Exercises Other Exercises: gentle ROM to R ankle   Shoulder Instructions      Home Living Family/patient expects to be discharged to:: Private residence Living Arrangements: Spouse/significant other Available Help at Discharge: Available 24 hours/day Type of Home: House Home Access: Stairs to enter [employer] of Steps: 4 Entrance Stairs-Rails: Right;Left Home Layout: Multi-level Alternate Level Stairs-Number of Steps: Reports his bed/bath in downstairs,   Bathroom Shower/Tub: Chief Strategy Officer: Standard Bathroom Accessibility: Yes How Accessible: Accessible via walker Home Equipment: None   Additional Comments: pt was d/c'd from CIR to have surgery, plan is to return. Pt lives with spouse typically who can provide 24/7 assist.  Lives With: Spouse;Family    Prior Functioning/Environment Level of Independence: Needs assistance  Gait / Transfers Assistance Needed: pt doing lateral scoot and std pvt transfers in CIR ADL's / Homemaking Assistance Needed: working with OT in CIR   Comments: Prior to initial BKA, pt was independent in all tasks        OT Problem List: Decreased strength;Decreased activity tolerance;Impaired balance (sitting and/or standing);Decreased safety awareness;Decreased knowledge of use of DME or AE;Decreased knowledge of precautions;Pain;Decreased cognition      OT Treatment/Interventions: Self-care/ADL training;DME and/or AE instruction;Therapeutic activities;Patient/family education;Balance training    OT Goals(Current goals can be found in the care plan section) Acute Rehab OT Goals Patient Stated Goal: go home OT Goal Formulation: With patient Time For Goal Achievement: 06/20/21  Potential to Achieve Goals:  Good ADL Goals Pt Will Perform Grooming: with modified independence;sitting Pt Will Perform Lower Body Bathing: with modified independence;sitting/lateral leans;with adaptive equipment Pt Will Perform Lower Body Dressing: with set-up;with adaptive equipment;sitting/lateral leans Pt Will Transfer to Toilet: with supervision;bedside commode Pt Will Perform Toileting - Clothing Manipulation and hygiene: with modified independence;sitting/lateral leans Pt Will Perform Tub/Shower Transfer: Tub transfer;with supervision;tub bench  OT Frequency: Min 2X/week   Barriers to D/C:            Co-evaluation              AM-PAC OT "6 Clicks" Daily Activity     Outcome Measure Help from another person eating meals?: A Little Help from another person taking care of personal grooming?: A Little Help from another person toileting, which includes using toliet, bedpan, or urinal?: A Lot Help from another person bathing (including washing, rinsing, drying)?: A Lot Help from another person to put on and taking off regular upper body clothing?: A Little Help from another person to put on and taking off regular lower body clothing?: A Lot 6 Click Score: 15   End of Session Equipment Utilized During Treatment: Gait belt Nurse Communication: Mobility status  Activity Tolerance: Patient tolerated treatment well Patient left: in bed;with call bell/phone within reach;with bed alarm set  OT Visit Diagnosis: Unsteadiness on feet (R26.81);Other abnormalities of gait and mobility (R26.89);Muscle weakness (generalized) (M62.81);Pain;Other symptoms and signs involving cognitive function Pain - Right/Left: Left Pain - part of body: Leg                Time: 8832-5498 OT Time Calculation (min): 25 min Charges:  OT General Charges $OT Visit: 1 Visit OT Evaluation $OT Eval Moderate Complexity: 1 Mod OT Treatments $Self Care/Home Management : 8-22 mins  Nyoka Cowden OTR/L Acute Rehabilitation Services Pager:  [phone] Office: [phone]  Evern Bio Jasmine Maceachern 06/06/2021, 11:51 AM

## 2021-06-06 NOTE — Progress Notes (Signed)
Orthopedic Tech Progress Note Patient Details:  Todd Duran 1946/03/12 [id number]  Ortho Devices Type of Ortho Device: Darco shoe Ortho Device/Splint Location: Right Foot Ortho Device/Splint Interventions: Application   Post Interventions Patient Tolerated: Well Instructions Provided: Adjustment of device  Veanna Dower E Allison Silva 06/06/2021, 8:19 AM

## 2021-06-06 NOTE — Evaluation (Signed)
Physical Therapy Evaluation Patient Details Name: Todd Duran MRN: [id number] DOB: Jan 02, 1946 Today's Date: 06/06/2021   History of Present Illness  Pt is a 75 yo male admitted on 7/29 from CIR due to R lower limb ischemia. Pt underwent R femoral endarterectomy, R iliac & superficial fem art stent, R 5th toe amp. PSH: L AKA PMH: DM, HTN   Clinical Impression  Pt admitted with above. Pt with limited R LE WBing tolerance today limiting standing/std pvt transfers ability. Pt with noted decreased response time however able to follow simple commands. Pt to benefit from CIR UPon d/c to achieve safe supervision level of function  for safe transition home with spouse. Acute PT to cont to follow.    Follow Up Recommendations CIR    Equipment Recommendations  Wheelchair (measurements PT);Wheelchair cushion (measurements PT)    Recommendations for Other Services Rehab consult     Precautions / Restrictions Precautions Precautions: Fall Precaution Comments: L aka, R 5th Required Braces or Orthoses: Other Brace Other Brace: R darco shoe Restrictions Weight Bearing Restrictions: Yes RLE Weight Bearing: Weight bearing as tolerated (in DARCO shoe) LLE Weight Bearing: Non weight bearing      Mobility  Bed Mobility Overal bed mobility: Needs Assistance Bed Mobility: Supine to Sit     Supine to sit: Min assist     General bed mobility comments: max directional verbal cues, increased time, minA to prevent falling backwards    Transfers Overall transfer level: Needs assistance   Transfers: Lateral/Scoot Transfers          Lateral/Scoot Transfers: Min assist General transfer comment: attempted to complete std pvt transfer x 2 however pt declined due to incresaed R foot pain and tightness with beginning of WBing to prep for standing. Pt able to instruct where to place drop arm recliner for optimal transfer safety. Pt transfered to R side  Ambulation/Gait             General  Gait Details: unable this date  Stairs            Wheelchair Mobility    Modified Rankin (Stroke Patients Only)       Balance Overall balance assessment: Needs assistance Sitting-balance support: Feet supported;No upper extremity supported Sitting balance-Leahy Scale: Fair         Standing balance comment: unable                             Pertinent Vitals/Pain Pain Assessment: 0-10 Pain Score: 8  (8/10 R foot, 5/10 L residual limb) Pain Location: L limb and R foot Pain Descriptors / Indicators: Grimacing;Guarding;Discomfort;Pins and needles Pain Intervention(s): Limited activity within patient's tolerance    Home Living Family/patient expects to be discharged to:: Inpatient rehab                 Additional Comments: pt was d/c'd from CIR to have surgery, plan is to return. Pt lives with spouse typically who can provide 24/7 assist.    Prior Function Level of Independence: Needs assistance   Gait / Transfers Assistance Needed: pt doing lateral scoot and std pvt transfers in CIR  ADL's / Homemaking Assistance Needed: working with OT in CIR        Hand Dominance   Dominant Hand: Right    Extremity/Trunk Assessment   Upper Extremity Assessment Upper Extremity Assessment: Overall WFL for tasks assessed    Lower Extremity Assessment Lower Extremity Assessment: Generalized weakness;RLE  deficits/detail;LLE deficits/detail RLE Deficits / Details: pt able to initiate SLR in bed, would not do ankle pump due to tightness and pain LLE Deficits / Details: pt with good hip ROM in all directions    Cervical / Trunk Assessment Cervical / Trunk Assessment: Normal  Communication   Communication: HOH  Cognition Arousal/Alertness: Awake/alert Behavior During Therapy: WFL for tasks assessed/performed Overall Cognitive Status: Impaired/Different from baseline Area of Impairment: Safety/judgement;Awareness;Problem solving;Memory                        Following Commands: Follows one step commands with increased time Safety/Judgement: Decreased awareness of safety;Decreased awareness of deficits Awareness: Intellectual Problem Solving: Slow processing;Decreased initiation General Comments: pt with delayed processing, appeared to have incorrect report of home set up when compared to recent admission report      General Comments General comments (skin integrity, edema, etc.): VSS    Exercises Other Exercises Other Exercises: gentle ROM to R ankle   Assessment/Plan    PT Assessment Patient needs continued PT services  PT Problem List Decreased strength;Decreased mobility;Decreased balance;Decreased activity tolerance;Decreased knowledge of precautions;Decreased knowledge of use of DME       PT Treatment Interventions DME instruction;Gait training;Functional mobility training;Therapeutic activities;Balance training;Patient/family education;Wheelchair mobility training    PT Goals (Current goals can be found in the Care Plan section)  Acute Rehab PT Goals Patient Stated Goal: didn't state PT Goal Formulation: With patient Time For Goal Achievement: 06/19/21 Potential to Achieve Goals: Good    Frequency Min 3X/week   Barriers to discharge        Co-evaluation               AM-PAC PT "6 Clicks" Mobility  Outcome Measure Help needed turning from your back to your side while in a flat bed without using bedrails?: A Little Help needed moving from lying on your back to sitting on the side of a flat bed without using bedrails?: A Little Help needed moving to and from a bed to a chair (including a wheelchair)?: A Little Help needed standing up from a chair using your arms (e.g., wheelchair or bedside chair)?: A Lot Help needed to walk in hospital room?: A Lot Help needed climbing 3-5 steps with a railing? : Total 6 Click Score: 14    End of Session Equipment Utilized During Treatment: Gait belt Activity  Tolerance: Patient limited by pain Patient left: in chair;with call bell/phone within reach;with chair alarm set Nurse Communication: Mobility status PT Visit Diagnosis: Other abnormalities of gait and mobility (R26.89);Muscle weakness (generalized) (M62.81)    Time: 2122-4825 PT Time Calculation (min) (ACUTE ONLY): 21 min   Charges:   PT Evaluation $PT Eval Moderate Complexity: 1 Mod          Lewis Shock, PT, DPT Acute Rehabilitation Services Pager #: [phone] Office #: [phone]   Iona Hansen 06/06/2021, 8:30 AM

## 2021-06-06 NOTE — Progress Notes (Signed)
Orthopedic Tech Progress Note Patient Details:  Todd Duran [birth year] [id number] [place] Shrinker has been ordered from [facility]. Patient ID: Todd Duran, male   DOB: [birth year], 75 y.o.   MRN: [id number]  Smitty Pluck 06/06/2021, 1:51 PM

## 2021-06-06 NOTE — Progress Notes (Addendum)
Progress Note    06/06/2021 1:35 PM * No surgery found *  Subjective:  no complaints   Vitals:   06/06/21 0855 06/06/21 1000  BP: 100/61 114/66  Pulse: 88 79  Resp: 15 16  Temp: 98.9 F (37.2 C) 98.9 F (37.2 C)  SpO2: 100%    Physical Exam: Cardiac:  regular Lungs:  non labored Incisions:  right groin incision is clean, dry and intact without swelling or hematoma Extremities:  well perfused and warm. Faint doppler PT signal in right. Right foot dressings clean, dry and intact Neurologic: alert and oriented  CBC    Component Value Date/Time   WBC 9.4 06/06/2021 0153   RBC 2.96 (L) 06/06/2021 0153   HGB 8.2 (L) 06/06/2021 0153   HCT 24.5 (L) 06/06/2021 0153   PLT 376 06/06/2021 0153   MCV 82.8 06/06/2021 0153   MCH 27.7 06/06/2021 0153   MCHC 33.5 06/06/2021 0153   RDW 17.3 (H) 06/06/2021 0153   LYMPHSABS 1.2 05/26/2021 0443   MONOABS 0.4 05/26/2021 0443   EOSABS 0.1 05/26/2021 0443   BASOSABS 0.0 05/26/2021 0443    BMET    Component Value Date/Time   NA 131 (L) 06/06/2021 0153   K 5.4 (H) 06/06/2021 0153   CL 101 06/06/2021 0153   CO2 22 06/06/2021 0153   GLUCOSE 179 (H) 06/06/2021 0153   BUN 17 06/06/2021 0153   CREATININE 1.04 06/06/2021 0153   CALCIUM 9.1 06/06/2021 0153   GFRNONAA >60 06/06/2021 0153    INR No results found for: INR   Intake/Output Summary (Last 24 hours) at 06/06/2021 1335 Last data filed at 06/06/2021 1024 Gross per 24 hour  Intake 2050 ml  Output 1105 ml  Net 945 ml     Assessment/Plan:  75 y.o. 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 RLE is well perfused and warm with doppler PT signal that is faint Right groin incision is clean, dry and intact without swelling or hematoma Right foot dressings will be taken down tomorrow Hemodynamically stable Shrinker ordered for left AKA PT/OT recommending return to CIR   Graceann Congress, PA-C [facility] [phone] 06/06/2021 1:35 PM  I have interviewed the patient and examined the  patient. I agree with the findings by the PA.  No significant rest pain of the right foot.  His right groin incision looks excellent.  Cari Caraway, MD

## 2021-06-06 NOTE — Progress Notes (Signed)
06/05/2021 2145 Received pt to room 4e-09 from PACU.  Pt is A&Ox2.  No C/O voiced.  Tele monitor applied and CCMD notified.  CHG bath given.  Oriented to room call light and bed.  Call bell in reach. Kathryne Hitch

## 2021-06-07 LAB — GLUCOSE, CAPILLARY
Glucose-Capillary: 86 mg/dL (ref 70–99)
Glucose-Capillary: 123 mg/dL — ABNORMAL HIGH (ref 70–99)
Glucose-Capillary: 117 mg/dL — ABNORMAL HIGH (ref 70–99)
Glucose-Capillary: 131 mg/dL — ABNORMAL HIGH (ref 70–99)

## 2021-06-07 NOTE — Progress Notes (Addendum)
Progress Note    06/07/2021 11:56 AM * No surgery found *  Subjective:  no complaints   Vitals:   06/07/21 0800 06/07/21 1150  BP: 122/69 106/70  Pulse: 92 86  Resp: 11 19  Temp: 98.6 F (37 C) 98.8 F (37.1 C)  SpO2: 99% 98%   Physical Exam: Cardiac:  regular Lungs:  non labored Incisions:  right groin incision well appearing, Right 5th toe amputation site with duskiness on dorsal and medial margins of incision. Xerofrom, 4x4s, kerlix and ACE reapplied   Extremities: Faint Doppler PT and Peroneal signals Neurologic: alert and oriented  CBC    Component Value Date/Time   WBC 9.4 06/06/2021 0153   RBC 2.96 (L) 06/06/2021 0153   HGB 8.2 (L) 06/06/2021 0153   HCT 24.5 (L) 06/06/2021 0153   PLT 376 06/06/2021 0153   MCV 82.8 06/06/2021 0153   MCH 27.7 06/06/2021 0153   MCHC 33.5 06/06/2021 0153   RDW 17.3 (H) 06/06/2021 0153   LYMPHSABS 1.2 05/26/2021 0443   MONOABS 0.4 05/26/2021 0443   EOSABS 0.1 05/26/2021 0443   BASOSABS 0.0 05/26/2021 0443    BMET    Component Value Date/Time   NA 131 (L) 06/06/2021 0153   K 5.4 (H) 06/06/2021 0153   CL 101 06/06/2021 0153   CO2 22 06/06/2021 0153   GLUCOSE 179 (H) 06/06/2021 0153   BUN 17 06/06/2021 0153   CREATININE 1.04 06/06/2021 0153   CALCIUM 9.1 06/06/2021 0153   GFRNONAA >60 06/06/2021 0153    INR No results found for: INR   Intake/Output Summary (Last 24 hours) at 06/07/2021 1156 Last data filed at 06/07/2021 1146 Gross per 24 hour  Intake 558 ml  Output 1380 ml  Net -822 ml     Assessment/Plan:  75 y.o. 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 RLE is well perfused and warm with doppler PT signal that is faint Right 5th toe amputation site with some duskiness on dorsal and medial margins of incision. Will need daily dressing changes No rest pain symptoms in R foot Right groin incision is clean, dry and intact without swelling or hematoma Hemodynamically stable Shrinker ordered for left AKA PT/OT recommending return to CIR Will defer  to Dr. Myra Gianotti tomorrow to make decision regarding RLE bypass   DVT prophylaxis:  sq heparin    Graceann Congress, PA-C [facility] [phone] 06/07/2021 11:56 AM  I have interviewed the patient and examined the patient. I agree with the findings by the PA.  The toe amp site is slightly dusky as documented in the photograph below.  He does have a monophasic posterior tibial signal on the right with a Doppler.  Being considered for femoropopliteal bypass grafting by Dr. Myra Gianotti.  Cari Caraway, MD

## 2021-06-08 ENCOUNTER — Inpatient Hospital Stay (HOSPITAL_COMMUNITY): Payer: No Typology Code available for payment source

## 2021-06-08 ENCOUNTER — Encounter (HOSPITAL_COMMUNITY): Payer: Self-pay | Admitting: Surgery

## 2021-06-08 DIAGNOSIS — L8990 Pressure ulcer of unspecified site, unspecified stage: Secondary | ICD-10-CM | POA: Insufficient documentation

## 2021-06-08 LAB — GLUCOSE, CAPILLARY
Glucose-Capillary: 140 mg/dL — ABNORMAL HIGH (ref 70–99)
Glucose-Capillary: 150 mg/dL — ABNORMAL HIGH (ref 70–99)
Glucose-Capillary: 131 mg/dL — ABNORMAL HIGH (ref 70–99)
Glucose-Capillary: 155 mg/dL — ABNORMAL HIGH (ref 70–99)

## 2021-06-08 LAB — POCT ACTIVATED CLOTTING TIME: Activated Clotting Time: 225 seconds

## 2021-06-08 NOTE — Progress Notes (Signed)
VASCULAR LAB    Bilateral lower extremity vein mapping has been performed.  See CV proc for preliminary results.   Todd Duran, RVT 06/08/2021, 2:40 PM

## 2021-06-08 NOTE — H&P (Signed)
      Subjective  -   This is a 75 year old gentleman who presented with a nonsalvageable ischemic left foot.  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  He has been up in rehab for his amputation.  He has developed a necrotic right fifth toe.  I will hold his Glucophage in anticipation of contrast for tomorrow.     Physical Exam:   Necrotic right fifth toe Dressing to above-knee amputation is clean and dry Abdomen is soft                     Assessment/Plan:     Left above-knee amputation: Incision appears to be healing nicely.  He will continue with inpatient rehab.  Ischemic right toe: This was examined and discussed with the patient and his family who was present at the bedside.  He has known multilevel disease on the right.  I told him that his right toe is no longer salvageable.  He will need a right fifth toe amputation however without revascularization this will not heal.  I discussed proceeding with a right femoral endarterectomy, right iliac stent, and right superficial femoral artery stenting.  At the same time I will perform a right fifth toe amputation.  I plan on doing this.  He will be n.p.o. after midnight.  All questions were answered, and he wishes to proceed   Todd Duran 06/04/2021 4:35 PM --       Vitals:    06/04/21 0848 06/04/21 1334  BP: 126/70 107/60  Pulse: 90 86  Resp:   16  Temp:   97.9 F (36.6 C)  SpO2:   100%      Intake/Output Summary (Last 24 hours) at 06/04/2021 1635 Last data filed at 06/04/2021 1300    Gross per 24 hour  Intake 240 ml  Output 1200 ml  Net -960 ml        Laboratory CBC Labs (Brief)          Component Value Date/Time    WBC 7.8 06/01/2021 0701    HGB  7.7 (L) 06/03/2021 0608    HCT 24.3 (L) 06/03/2021 0608    PLT 560 (H) 06/01/2021 0701        BMET Labs (Brief)          Component Value Date/Time    NA 133 (L) 05/28/2021 0719    K 3.9 05/28/2021 0719    CL 100 05/28/2021 0719    CO2 24 05/28/2021 0719    GLUCOSE 185 (H) 05/28/2021 0719    BUN 20 05/28/2021 0719    CREATININE 0.81 05/28/2021 0719    CALCIUM 9.1 05/28/2021 0719    GFRNONAA >60 05/28/2021 0719        COAG Recent Labs  No results found for: INR, PROTIME   Recent Labs  No results found for: PTT     Antibiotics Anti-infectives (From admission, onward)        None               V. Charlena Cross, M.D., [facility] [place] Office: [phone] Pager:  [phone]

## 2021-06-08 NOTE — Progress Notes (Signed)
Inpatient Rehab Admissions Coordinator:   Following for my colleague, Ottie Glazier.  Note plans for further surgery on Wednesday.  We will follow up with patient afterwards on Wednesday or Thursday.    Estill Dooms, PT, DPT Admissions Coordinator [phone] 06/08/21  10:34 AM

## 2021-06-08 NOTE — Progress Notes (Addendum)
  Progress Note    06/08/2021 7:38 AM * No surgery found *  Subjective:  No rest pain R foot.   Vitals:   06/08/21 0006 06/08/21 0408  BP: 118/72 121/71  Pulse: (!) 102 (!) 101  Resp: 20 20  Temp: 98.3 F (36.8 C) 98.7 F (37.1 C)  SpO2: 99% 97%   Physical Exam: Lungs:  non labored Incisions:  L AKA incision c/d/I; R groin healing well; R 5th toe amp site with necrotic skin edges and dusky appearing dorsal foot Extremities:  PT by doppler Neurologic: A&O  CBC    Component Value Date/Time   WBC 9.4 06/06/2021 0153   RBC 2.96 (L) 06/06/2021 0153   HGB 8.2 (L) 06/06/2021 0153   HCT 24.5 (L) 06/06/2021 0153   PLT 376 06/06/2021 0153   MCV 82.8 06/06/2021 0153   MCH 27.7 06/06/2021 0153   MCHC 33.5 06/06/2021 0153   RDW 17.3 (H) 06/06/2021 0153   LYMPHSABS 1.2 05/26/2021 0443   MONOABS 0.4 05/26/2021 0443   EOSABS 0.1 05/26/2021 0443   BASOSABS 0.0 05/26/2021 0443    BMET    Component Value Date/Time   NA 131 (L) 06/06/2021 0153   K 5.4 (H) 06/06/2021 0153   CL 101 06/06/2021 0153   CO2 22 06/06/2021 0153   GLUCOSE 179 (H) 06/06/2021 0153   BUN 17 06/06/2021 0153   CREATININE 1.04 06/06/2021 0153   CALCIUM 9.1 06/06/2021 0153   GFRNONAA >60 06/06/2021 0153    INR No results found for: INR   Intake/Output Summary (Last 24 hours) at 06/08/2021 0738 Last data filed at 06/08/2021 0734 Gross per 24 hour  Intake 358 ml  Output 1050 ml  Net -692 ml     Assessment/Plan:  75 y.o. male is s/p R CFA endart with iliac stent   L AKA healing well R 5th toe amp site with necrotic skin edges and malodorous Dr. Myra Gianotti will evaluate the patient later today and provide further treatment plans    Emilie Rutter, PA-C [facility] [phone] 06/08/2021 7:38 AM   I agree with the above and have seen and evaluated the patient. His toe amp is not healing.  I discussed proceeding with right femoral popliteal bypass and further debridement of the  right foot, possibly removing the 4th toe.  I will get vein mapping today and plan for surgery Wednesday.  I will also check carotid duplex.  [name]

## 2021-06-08 NOTE — Progress Notes (Signed)
VASCULAR LAB    Carotid duplex has been performed.  See CV proc for preliminary results.   Dimitry Holsworth, RVT 06/08/2021, 2:41 PM

## 2021-06-09 LAB — GLUCOSE, CAPILLARY
Glucose-Capillary: 117 mg/dL — ABNORMAL HIGH (ref 70–99)
Glucose-Capillary: 143 mg/dL — ABNORMAL HIGH (ref 70–99)
Glucose-Capillary: 114 mg/dL — ABNORMAL HIGH (ref 70–99)
Glucose-Capillary: 141 mg/dL — ABNORMAL HIGH (ref 70–99)

## 2021-06-09 NOTE — Progress Notes (Addendum)
  Progress Note    06/09/2021 7:32 AM * No surgery date entered *  Subjective:  no new complaints   Vitals:   06/08/21 2357 06/09/21 0400  BP: (!) 109/57 118/62  Pulse: 73 98  Resp: 20 20  Temp: 98.7 F (37.1 C) 98.7 F (37.1 C)  SpO2: 96% 96%   Physical Exam: Lungs:  non labored Incisions:  R groin incision c/d/I; necrotic skin edges of R 5th toe amp; L AKA healing well Extremities:  R foot and toes dusky Neurologic: A&O  CBC    Component Value Date/Time   WBC 9.4 06/06/2021 0153   RBC 2.96 (L) 06/06/2021 0153   HGB 8.2 (L) 06/06/2021 0153   HCT 24.5 (L) 06/06/2021 0153   PLT 376 06/06/2021 0153   MCV 82.8 06/06/2021 0153   MCH 27.7 06/06/2021 0153   MCHC 33.5 06/06/2021 0153   RDW 17.3 (H) 06/06/2021 0153   LYMPHSABS 1.2 05/26/2021 0443   MONOABS 0.4 05/26/2021 0443   EOSABS 0.1 05/26/2021 0443   BASOSABS 0.0 05/26/2021 0443    BMET    Component Value Date/Time   NA 131 (L) 06/06/2021 0153   K 5.4 (H) 06/06/2021 0153   CL 101 06/06/2021 0153   CO2 22 06/06/2021 0153   GLUCOSE 179 (H) 06/06/2021 0153   BUN 17 06/06/2021 0153   CREATININE 1.04 06/06/2021 0153   CALCIUM 9.1 06/06/2021 0153   GFRNONAA >60 06/06/2021 0153    INR No results found for: INR   Intake/Output Summary (Last 24 hours) at 06/09/2021 0732 Last data filed at 06/09/2021 0621 Gross per 24 hour  Intake 1050 ml  Output 1750 ml  Net -700 ml     Assessment/Plan:  74 y.o. male is s/p R CFA endarterectomy with 5th toe amp * No surgery date entered *   R fifth toe amp necrotic despite CFA endarterectomy Plan is for R femoral to popliteal artery bypass and possible 4ht toe amp tomorrow with Dr. Tatayana Beshears GSV from junction to knee appears adequate for conduit use Carotid duplex negative NPO past midnight; consent ordered   Matthew Eveland, PA-C [facility] [phone] 06/09/2021 7:32 AM  I agree with the above.  I have seen and evaluated the patient.  His 5th toe  amputation is not healing and I discussed proceeding with a fem pop bypass and possible 4th toe amputation in an attempt to salvage the right leg.  He is in agreement.  I also discussed this with his wife via phone.  He understands that even with revascularization, he is at risk for limb loss.  Wells Lovinia Snare  

## 2021-06-09 NOTE — Progress Notes (Signed)
PT Cancellation Note  Patient Details Name: Todd Duran MRN: [id number] DOB: [birth year]   Cancelled Treatment:    Reason Eval/Treat Not Completed: Patient declined, no reason specified (mobility cleared with vascular however pt refused mobility or participation this date in anticipation of pending sx tomorrow and wanting to wait until post op. Pt educated for benefit but still denied)   [name] [name] 06/09/2021, 12:00 PM Merryl Hacker, PT [facility] Pager: [phone] Office: [phone]

## 2021-06-09 NOTE — H&P (View-Only) (Signed)
  Progress Note    06/09/2021 7:32 AM * No surgery date entered *  Subjective:  no new complaints   Vitals:   06/08/21 2357 06/09/21 0400  BP: (!) 109/57 118/62  Pulse: 73 98  Resp: 20 20  Temp: 98.7 F (37.1 C) 98.7 F (37.1 C)  SpO2: 96% 96%   Physical Exam: Lungs:  non labored Incisions:  R groin incision c/d/I; necrotic skin edges of R 5th toe amp; L AKA healing well Extremities:  R foot and toes dusky Neurologic: A&O  CBC    Component Value Date/Time   WBC 9.4 06/06/2021 0153   RBC 2.96 (L) 06/06/2021 0153   HGB 8.2 (L) 06/06/2021 0153   HCT 24.5 (L) 06/06/2021 0153   PLT 376 06/06/2021 0153   MCV 82.8 06/06/2021 0153   MCH 27.7 06/06/2021 0153   MCHC 33.5 06/06/2021 0153   RDW 17.3 (H) 06/06/2021 0153   LYMPHSABS 1.2 05/26/2021 0443   MONOABS 0.4 05/26/2021 0443   EOSABS 0.1 05/26/2021 0443   BASOSABS 0.0 05/26/2021 0443    BMET    Component Value Date/Time   NA 131 (L) 06/06/2021 0153   K 5.4 (H) 06/06/2021 0153   CL 101 06/06/2021 0153   CO2 22 06/06/2021 0153   GLUCOSE 179 (H) 06/06/2021 0153   BUN 17 06/06/2021 0153   CREATININE 1.04 06/06/2021 0153   CALCIUM 9.1 06/06/2021 0153   GFRNONAA >60 06/06/2021 0153    INR No results found for: INR   Intake/Output Summary (Last 24 hours) at 06/09/2021 0732 Last data filed at 06/09/2021 2831 Gross per 24 hour  Intake 1050 ml  Output 1750 ml  Net -700 ml     Assessment/Plan:  75 y.o. male is s/p R CFA endarterectomy with 5th toe amp * No surgery date entered *   R fifth toe amp necrotic despite CFA endarterectomy Plan is for R femoral to popliteal artery bypass and possible 4ht toe amp tomorrow with Dr. Myra Gianotti GSV from junction to knee appears adequate for conduit use Carotid duplex negative NPO past midnight; consent ordered   Emilie Rutter, PA-C [facility] [phone] 06/09/2021 7:32 AM  I agree with the above.  I have seen and evaluated the patient.  His 5th toe  amputation is not healing and I discussed proceeding with a fem pop bypass and possible 4th toe amputation in an attempt to salvage the right leg.  He is in agreement.  I also discussed this with his wife via phone.  He understands that even with revascularization, he is at risk for limb loss.  [name]

## 2021-06-10 ENCOUNTER — Encounter (HOSPITAL_COMMUNITY)
Admission: RE | Disposition: A | Discharge status: Home / Self Care | Payer: Self-pay | Source: Intra-hospital | Attending: Surgery

## 2021-06-10 ENCOUNTER — Inpatient Hospital Stay (HOSPITAL_COMMUNITY): Payer: No Typology Code available for payment source | Admitting: Anesthesiology

## 2021-06-10 ENCOUNTER — Encounter (HOSPITAL_COMMUNITY): Payer: Self-pay | Admitting: Vascular Surgery

## 2021-06-10 DIAGNOSIS — I70261 Atherosclerosis of native arteries of extremities with gangrene, right leg: Secondary | ICD-10-CM

## 2021-06-10 HISTORY — PX: FEMORAL-POPLITEAL BYPASS GRAFT: SHX937

## 2021-06-10 HISTORY — PX: VEIN HARVEST: SHX6363

## 2021-06-10 HISTORY — PX: AMPUTATION: SHX166

## 2021-06-10 LAB — GLUCOSE, CAPILLARY
Glucose-Capillary: 124 mg/dL — ABNORMAL HIGH (ref 70–99)
Glucose-Capillary: 113 mg/dL — ABNORMAL HIGH (ref 70–99)
Glucose-Capillary: 131 mg/dL — ABNORMAL HIGH (ref 70–99)
Glucose-Capillary: 130 mg/dL — ABNORMAL HIGH (ref 70–99)
Glucose-Capillary: 277 mg/dL — ABNORMAL HIGH (ref 70–99)

## 2021-06-10 LAB — POCT I-STAT EG7
Acid-Base Excess: 0 mmol/L (ref 0.0–2.0)
Bicarbonate: 25.7 mmol/L (ref 20.0–28.0)
Calcium, Ion: 1.44 mmol/L — ABNORMAL HIGH (ref 1.15–1.40)
HCT: 22 % — ABNORMAL LOW (ref 39.0–52.0)
Hemoglobin: 7.5 g/dL — ABNORMAL LOW (ref 13.0–17.0)
O2 Saturation: 90 %
Potassium: 4.3 mmol/L (ref 3.5–5.1)
Sodium: 133 mmol/L — ABNORMAL LOW (ref 135–145)
TCO2: 27 mmol/L (ref 22–32)
pCO2, Ven: 44 mmHg (ref 44.0–60.0)
pH, Ven: 7.374 (ref 7.250–7.430)
pO2, Ven: 59 mmHg — ABNORMAL HIGH (ref 32.0–45.0)

## 2021-06-10 SURGERY — BYPASS GRAFT FEMORAL-POPLITEAL ARTERY
Anesthesia: General | Site: Leg Upper | Laterality: Right

## 2021-06-10 MED ORDER — ROCURONIUM BROMIDE 10 MG/ML (PF) SYRINGE
PREFILLED_SYRINGE | INTRAVENOUS | Status: DC | PRN
  Administered 2021-06-10: 60 mg via INTRAVENOUS
  Administered 2021-06-10: 40 mg via INTRAVENOUS

## 2021-06-10 MED ORDER — PHENYLEPHRINE 40 MCG/ML (10ML) SYRINGE FOR IV PUSH (FOR BLOOD PRESSURE SUPPORT)
PREFILLED_SYRINGE | INTRAVENOUS | Status: AC
  Filled 2021-06-10: qty 10

## 2021-06-10 MED ORDER — ACETAMINOPHEN 500 MG PO TABS
1000.0000 mg | ORAL_TABLET | Freq: Once | ORAL | Status: AC
  Administered 2021-06-10: 1000 mg via ORAL
  Filled 2021-06-10: qty 2

## 2021-06-10 MED ORDER — CEFAZOLIN SODIUM-DEXTROSE 2-3 GM-%(50ML) IV SOLR
INTRAVENOUS | Status: DC | PRN
  Administered 2021-06-10: 2 g via INTRAVENOUS

## 2021-06-10 MED ORDER — HEPARIN 6000 UNIT IRRIGATION SOLUTION
Status: AC
  Filled 2021-06-10: qty 500

## 2021-06-10 MED ORDER — CEFAZOLIN SODIUM 1 G IJ SOLR
INTRAMUSCULAR | Status: AC
  Filled 2021-06-10: qty 20

## 2021-06-10 MED ORDER — MIDAZOLAM HCL 2 MG/2ML IJ SOLN
INTRAMUSCULAR | Status: AC
  Filled 2021-06-10: qty 2

## 2021-06-10 MED ORDER — PROPOFOL 10 MG/ML IV BOLUS
INTRAVENOUS | Status: DC | PRN
  Administered 2021-06-10: 120 mg via INTRAVENOUS

## 2021-06-10 MED ORDER — DEXAMETHASONE SODIUM PHOSPHATE 10 MG/ML IJ SOLN
INTRAMUSCULAR | Status: AC
  Filled 2021-06-10: qty 1

## 2021-06-10 MED ORDER — ROCURONIUM BROMIDE 10 MG/ML (PF) SYRINGE
PREFILLED_SYRINGE | INTRAVENOUS | Status: AC
  Filled 2021-06-10: qty 10

## 2021-06-10 MED ORDER — FENTANYL CITRATE (PF) 100 MCG/2ML IJ SOLN: 25.0000 ug | INTRAMUSCULAR | Status: DC | PRN

## 2021-06-10 MED ORDER — PHENYLEPHRINE 40 MCG/ML (10ML) SYRINGE FOR IV PUSH (FOR BLOOD PRESSURE SUPPORT)
PREFILLED_SYRINGE | INTRAVENOUS | Status: DC | PRN
  Administered 2021-06-10 (×2): 120 ug via INTRAVENOUS

## 2021-06-10 MED ORDER — LIDOCAINE 2% (20 MG/ML) 5 ML SYRINGE
INTRAMUSCULAR | Status: AC
  Filled 2021-06-10: qty 5

## 2021-06-10 MED ORDER — FENTANYL CITRATE (PF) 250 MCG/5ML IJ SOLN
INTRAMUSCULAR | Status: AC
  Filled 2021-06-10: qty 5

## 2021-06-10 MED ORDER — FENTANYL CITRATE (PF) 250 MCG/5ML IJ SOLN
INTRAMUSCULAR | Status: DC | PRN
  Administered 2021-06-10: 50 ug via INTRAVENOUS
  Administered 2021-06-10: 75 ug via INTRAVENOUS
  Administered 2021-06-10 (×2): 50 ug via INTRAVENOUS
  Administered 2021-06-10: 75 ug via INTRAVENOUS
  Administered 2021-06-10 (×4): 50 ug via INTRAVENOUS

## 2021-06-10 MED ORDER — LACTATED RINGERS IV SOLN: INTRAVENOUS | Status: DC

## 2021-06-10 MED ORDER — ORAL CARE MOUTH RINSE: 15.0000 mL | Freq: Once | OROMUCOSAL | Status: AC

## 2021-06-10 MED ORDER — ONDANSETRON HCL 4 MG/2ML IJ SOLN: 4.0000 mg | Freq: Once | INTRAMUSCULAR | Status: DC | PRN

## 2021-06-10 MED ORDER — SUGAMMADEX SODIUM 200 MG/2ML IV SOLN
INTRAVENOUS | Status: DC | PRN
  Administered 2021-06-10: 200 mg via INTRAVENOUS

## 2021-06-10 MED ORDER — CHLORHEXIDINE GLUCONATE 0.12 % MT SOLN
15.0000 mL | Freq: Once | OROMUCOSAL | Status: AC
  Administered 2021-06-10: 15 mL via OROMUCOSAL
  Filled 2021-06-10: qty 15

## 2021-06-10 MED ORDER — HEMOSTATIC AGENTS (NO CHARGE) OPTIME
TOPICAL | Status: DC | PRN
  Administered 2021-06-10: 1 via TOPICAL

## 2021-06-10 MED ORDER — PROTAMINE SULFATE 10 MG/ML IV SOLN
INTRAVENOUS | Status: DC | PRN
  Administered 2021-06-10: 50 mg via INTRAVENOUS

## 2021-06-10 MED ORDER — ONDANSETRON HCL 4 MG/2ML IJ SOLN
INTRAMUSCULAR | Status: DC | PRN
  Administered 2021-06-10: 4 mg via INTRAVENOUS

## 2021-06-10 MED ORDER — ONDANSETRON HCL 4 MG/2ML IJ SOLN
INTRAMUSCULAR | Status: AC
  Filled 2021-06-10: qty 2

## 2021-06-10 MED ORDER — DEXAMETHASONE SODIUM PHOSPHATE 10 MG/ML IJ SOLN
INTRAMUSCULAR | Status: DC | PRN
  Administered 2021-06-10: 10 mg via INTRAVENOUS

## 2021-06-10 MED ORDER — CHLORHEXIDINE GLUCONATE CLOTH 2 % EX PADS
6.0000 | MEDICATED_PAD | Freq: Every day | CUTANEOUS | Status: DC
  Administered 2021-06-10: 6 via TOPICAL

## 2021-06-10 MED ORDER — PROPOFOL 10 MG/ML IV BOLUS
INTRAVENOUS | Status: AC
  Filled 2021-06-10: qty 20

## 2021-06-10 MED ORDER — HEPARIN SODIUM (PORCINE) 1000 UNIT/ML IJ SOLN
INTRAMUSCULAR | Status: DC | PRN
  Administered 2021-06-10: 1000 [IU] via INTRAVENOUS
  Administered 2021-06-10: 7500 [IU] via INTRAVENOUS

## 2021-06-10 MED ORDER — DEXMEDETOMIDINE HCL IN NACL 200 MCG/50ML IV SOLN
INTRAVENOUS | Status: DC | PRN
  Administered 2021-06-10: 8 ug via INTRAVENOUS
  Administered 2021-06-10: 12 ug via INTRAVENOUS

## 2021-06-10 MED ORDER — LIDOCAINE 2% (20 MG/ML) 5 ML SYRINGE
INTRAMUSCULAR | Status: DC | PRN
  Administered 2021-06-10: 80 mg via INTRAVENOUS

## 2021-06-10 MED ORDER — PHENYLEPHRINE HCL-NACL 20-0.9 MG/250ML-% IV SOLN
INTRAVENOUS | Status: DC | PRN
  Administered 2021-06-10: 50 ug/min via INTRAVENOUS

## 2021-06-10 MED ORDER — 0.9 % SODIUM CHLORIDE (POUR BTL) OPTIME
TOPICAL | Status: DC | PRN
  Administered 2021-06-10: 1000 mL

## 2021-06-10 MED ORDER — HEPARIN 6000 UNIT IRRIGATION SOLUTION
Status: DC | PRN
  Administered 2021-06-10: 1

## 2021-06-10 SURGICAL SUPPLY — 72 items
ADH SKN CLS APL DERMABOND .7 (GAUZE/BANDAGES/DRESSINGS) ×2
AGENT HMST KT MTR STRL THRMB (HEMOSTASIS) ×2
BAG COUNTER SPONGE SURGICOUNT (BAG) ×3 IMPLANT
BAG SPNG CNTER NS LX DISP (BAG) ×2
BAG SURGICOUNT SPONGE COUNTING (BAG) ×1
BANDAGE ESMARK 6X9 LF (GAUZE/BANDAGES/DRESSINGS) IMPLANT
BLADE AVERAGE 25MMX9MM (BLADE)
BLADE AVERAGE 25X9 (BLADE) ×2 IMPLANT
BLADE SAW SGTL 81X20 HD (BLADE) IMPLANT
BLADE SURG 10 STRL SS (BLADE) ×2 IMPLANT
BNDG CMPR 9X6 STRL LF SNTH (GAUZE/BANDAGES/DRESSINGS) ×2
BNDG ELASTIC 4X5.8 VLCR STR LF (GAUZE/BANDAGES/DRESSINGS) ×4 IMPLANT
BNDG ESMARK 6X9 LF (GAUZE/BANDAGES/DRESSINGS) ×4
BNDG GAUZE ELAST 4 BULKY (GAUZE/BANDAGES/DRESSINGS) ×6 IMPLANT
CANISTER SUCT 3000ML PPV (MISCELLANEOUS) ×4 IMPLANT
CANNULA VESSEL 3MM 2 BLNT TIP (CANNULA) ×4 IMPLANT
CLIP VESOCCLUDE MED 24/CT (CLIP) ×4 IMPLANT
CLIP VESOCCLUDE SM WIDE 24/CT (CLIP) ×4 IMPLANT
CUFF TOURN SGL QUICK 24 (TOURNIQUET CUFF)
CUFF TOURN SGL QUICK 34 (TOURNIQUET CUFF) ×4
CUFF TOURN SGL QUICK 42 (TOURNIQUET CUFF) IMPLANT
CUFF TRNQT CYL 24X4X16.5-23 (TOURNIQUET CUFF) IMPLANT
CUFF TRNQT CYL 34X4.125X (TOURNIQUET CUFF) IMPLANT
DERMABOND ADVANCED (GAUZE/BANDAGES/DRESSINGS) ×2
DERMABOND ADVANCED .7 DNX12 (GAUZE/BANDAGES/DRESSINGS) ×2 IMPLANT
DRAIN CHANNEL 15F RND FF W/TCR (WOUND CARE) IMPLANT
DRAPE HALF SHEET 40X57 (DRAPES) ×2 IMPLANT
DRAPE X-RAY CASS 24X20 (DRAPES) IMPLANT
DRSG XEROFORM 1X8 (GAUZE/BANDAGES/DRESSINGS) ×2 IMPLANT
ELECT REM PT RETURN 9FT ADLT (ELECTROSURGICAL) ×4
ELECTRODE REM PT RTRN 9FT ADLT (ELECTROSURGICAL) ×2 IMPLANT
EVACUATOR SILICONE 100CC (DRAIN) IMPLANT
GAUZE 4X4 16PLY ~~LOC~~+RFID DBL (SPONGE) ×2 IMPLANT
GAUZE SPONGE 4X4 12PLY STRL (GAUZE/BANDAGES/DRESSINGS) ×4 IMPLANT
GLOVE SURG POLYISO LF SZ7.5 (GLOVE) ×4 IMPLANT
GLOVE SURG UNDER POLY LF SZ7.5 (GLOVE) ×4 IMPLANT
GOWN STRL REUS W/ TWL LRG LVL3 (GOWN DISPOSABLE) ×4 IMPLANT
GOWN STRL REUS W/ TWL XL LVL3 (GOWN DISPOSABLE) ×2 IMPLANT
GOWN STRL REUS W/TWL LRG LVL3 (GOWN DISPOSABLE) ×8
GOWN STRL REUS W/TWL XL LVL3 (GOWN DISPOSABLE) ×4
HEMOSTAT SNOW SURGICEL 2X4 (HEMOSTASIS) IMPLANT
INSERT FOGARTY SM (MISCELLANEOUS) IMPLANT
KIT BASIN OR (CUSTOM PROCEDURE TRAY) ×4 IMPLANT
KIT TURNOVER KIT B (KITS) ×4 IMPLANT
LOOP VESSEL MINI RED (MISCELLANEOUS) ×2 IMPLANT
MARKER GRAFT CORONARY BYPASS (MISCELLANEOUS) IMPLANT
NS IRRIG 1000ML POUR BTL (IV SOLUTION) ×8 IMPLANT
PACK PERIPHERAL VASCULAR (CUSTOM PROCEDURE TRAY) ×4 IMPLANT
PAD ARMBOARD 7.5X6 YLW CONV (MISCELLANEOUS) ×8 IMPLANT
SET COLLECT BLD 21X3/4 12 (NEEDLE) IMPLANT
SPONGE T-LAP 18X18 ~~LOC~~+RFID (SPONGE) ×6 IMPLANT
STOPCOCK 4 WAY LG BORE MALE ST (IV SETS) IMPLANT
SURGIFLO W/THROMBIN 8M KIT (HEMOSTASIS) ×2 IMPLANT
SUT ETHILON 3 0 PS 1 (SUTURE) ×6 IMPLANT
SUT GORETEX 6.0 TT13 (SUTURE) IMPLANT
SUT GORETEX 6.0 TT9 (SUTURE) IMPLANT
SUT PROLENE 5 0 C 1 24 (SUTURE) ×6 IMPLANT
SUT PROLENE 6 0 BV (SUTURE) ×8 IMPLANT
SUT PROLENE 7 0 BV 1 (SUTURE) IMPLANT
SUT SILK 2 0 SH (SUTURE) ×4 IMPLANT
SUT SILK 3 0 (SUTURE) ×8
SUT SILK 3-0 18XBRD TIE 12 (SUTURE) IMPLANT
SUT VIC AB 2-0 CT1 27 (SUTURE) ×8
SUT VIC AB 2-0 CT1 TAPERPNT 27 (SUTURE) ×4 IMPLANT
SUT VIC AB 3-0 SH 27 (SUTURE) ×16
SUT VIC AB 3-0 SH 27X BRD (SUTURE) ×4 IMPLANT
SUT VICRYL 4-0 PS2 18IN ABS (SUTURE) ×8 IMPLANT
TOWEL GREEN STERILE (TOWEL DISPOSABLE) ×8 IMPLANT
TRAY FOLEY MTR SLVR 16FR STAT (SET/KITS/TRAYS/PACK) ×4 IMPLANT
TUBING EXTENTION W/L.L. (IV SETS) IMPLANT
UNDERPAD 30X36 HEAVY ABSORB (UNDERPADS AND DIAPERS) ×4 IMPLANT
WATER STERILE IRR 1000ML POUR (IV SOLUTION) ×4 IMPLANT

## 2021-06-10 NOTE — Progress Notes (Signed)
OT Cancellation Note  Patient Details Name: Todd Duran MRN: [id number] DOB: 04-20-1946   Cancelled Treatment:    Reason Eval/Treat Not Completed: Patient at procedure or test/ unavailable (in OR for femp pop bypass and possible 4th toe amputation). OT will continue to follow during acute admission, POC remains the same at this time.   Evern Bio Sherria Riemann 06/10/2021, 10:30 AM  Nyoka Cowden OTR/L [facility] Pager: [phone] Office: [phone]

## 2021-06-10 NOTE — Interval H&P Note (Signed)
History and Physical Interval Note:  06/10/2021 10:59 AM  Todd Duran  has presented today for surgery, with the diagnosis of PAD.  The various methods of treatment have been discussed with the patient and family. After consideration of risks, benefits and other options for treatment, the patient has consented to  Procedure(s): RIGHT FEMORAL TO POPLITEAL ARTERY BYPASS GRAFTING (Right) POSSIBLE RIGHT FOURTH TOE AMPUTATION (Right) as a surgical intervention.  The patient's history has been reviewed, patient examined, no change in status, stable for surgery.  I have reviewed the patient's chart and labs.  Questions were answered to the patient's satisfaction.     [name]

## 2021-06-10 NOTE — Anesthesia Procedure Notes (Signed)
Procedure Name: Intubation Date/Time: 06/10/2021 11:43 AM Performed by: Todd Cove, CRNA Pre-anesthesia Checklist: Patient identified, Emergency Drugs available, Suction available and Patient being monitored Patient Re-evaluated:Patient Re-evaluated prior to induction Oxygen Delivery Method: Circle system utilized Preoxygenation: Pre-oxygenation with 100% oxygen Induction Type: IV induction Ventilation: Mask ventilation without difficulty Laryngoscope Size: Mac and 4 Grade View: Grade I Tube type: Oral Tube size: 7.5 mm Number of attempts: 3 Airway Equipment and Method: Stylet and Oral airway Placement Confirmation: ETT inserted through vocal cords under direct vision, positive ETCO2 and breath sounds checked- equal and bilateral Secured at: 22 cm Tube secured with: Tape Dental Injury: Teeth and Oropharynx as per pre-operative assessment  Comments: SRNA attempted x1 with Miller 3 unable to pass tube. SRNA attempted unsuccessfully with MAC 4 grade 3 view. Todd Shave, MD successful x1 with Mac 4 grade 1 view.

## 2021-06-10 NOTE — Op Note (Signed)
Patient name: Todd Duran MRN: [id number] DOB: 02-Nov-1946 Sex: male  06/10/2021 Pre-operative Diagnosis: Ischemic right foot Post-operative diagnosis:  Same Surgeon:  [name] Assistants: Orlean Patten, PA Procedure:   #1: Redo right femoral artery exposure   #2: Right common femoral to above-knee popliteal artery bypass graft with ipsilateral nonreverse greater saphenous vein   #3: Right fourth and fifth toe amputation including metatarsal head Anesthesia: General Blood Loss: 200 cc Specimens: Right third and fourth toe  Findings: Persistent ischemic changes on the dorsum of the foot following bypass.  I was originally just going to do amputation of the fourth toe, however the skin and subcutaneous tissue was nonviable and so I removed the third toe.  After extensive debridement, I was able to get to what appeared to be healthy tissue, however there were parts on the anterior shin and dorsum of the foot that did not look very healthy.  The vein was of adequate caliber.  It measured approximately 4 mm proximally and 3 mm distally.  The proximal anastomosis is to the bovine patch on the distal common carotid artery.  The distal anastomosis was to a healthy popliteal artery at the level of the patella.  There was a brisk posterior tibial and peroneal Doppler signal at the ankle that was graft dependent after bypass.  Indications: This is a 76 year old gentleman who presented with a nonviable left leg and has undergone left above-knee amputation.  He developed progressive ischemic changes to the right fifth toe.  Last week I performed right iliac stenting and extensive right iliofemoral endarterectomy with bovine patch angioplasty.  I attempted percutaneous treatment of the superficial femoral artery, however this was not successful.  I also performed 1/5 toe amputation.  Over the weekend he has had discoloration of the skin edges from the amputation site extending to the fourth toe I  discussed with the patient and his wife that femoral-popliteal bypass graft is necessary to optimize his blood flow.  He is also going to require additional amputation on the foot.  I discussed that even with revascularization he is going to be at risk for transtibial amputation  Procedure:  The patient was identified in the holding area and taken to [facility] OR ROOM 16  The patient was then placed supine on the table. general anesthesia was administered.  The patient was prepped and draped in the usual sterile fashion.  A time out was called and antibiotics were administered.  A PA was necessary to expedite the procedure and assist with technical details.  Patient's previous longitudinal right groin incision was opened with a 10 blade.  Sharp dissection was used to dissect out the common femoral artery as well as the profunda and superficial femoral artery.  Through a medial above-knee incision, I exposed the popliteal artery.  The dissection plane from his most recent attempted percutaneous revascularization was clearly visible.  I was able to dissect out the popliteal artery below the dissection, near the patella.  At this level the artery was soft.  Through skip incisions I harvested the saphenous vein from the saphenofemoral junction distally.  There were multiple branches.  The vein did taper beginning in the mid thigh.  Once the vein was fully mobilized it was ligated with silk ties proximally and distally.  The vein was prepared on the back table.  It did distend nicely ranging from 3 to 4 mm in diameter throughout.  It was marked for orientation.  Next a curved Gore tunneler was  used to create a subsartorial tunnel between the proximal and distal incision.  The patient was then fully heparinized.  After the heparin circulated the common femoral artery was occluded with vascular clamps.  I opened the midportion of the patch in the distal common femoral artery.  The vein was placed in a nonreversed fashion and  spatulated to fit the size of the arteriotomy.  A running end-to-side anastomosis was performed with 5-0 Prolene.  Once this was completed, the clamps were released.  A Sims valvulotome was used to lyse the valves.  2 passes were performed.  There was excellent pulsatile flow through the vein graft following this.  Next, the vein was sutured to the tunneler and brought through the tunnel making sure to maintain proper orientation.  A web roll was then placed in the proximal thigh followed by a tourniquet.  The leg was exsanguinated with an Esmarch and the tourniquet was taken to 250 mm of pressure.  A #11 blade was used to make an arteriotomy in the popliteal artery which was extended longitudinally with Potts scissors.  There was a widely patent lumen with minimal calcification.  The vein was then cut to the appropriate length and spatulated to fit the size the arteriotomy.  A running anastomosis was created with 6-0 Prolene.  Prior to completion the tourniquet was let down and the appropriate flushing maneuvers were performed.  The anastomosis was then completed.  There was a excellent pulse within the vein graft.  There were graft dependent multiphasic signals in the posterior tibial and peroneal artery.  Next, the heparin was reversed with 50 mg of protamine.  The wounds were irrigated and hemostasis was achieved.  The vein harvest incisions were closed with 2 layers with 3-0 Vicryl.  The above-knee incision was closed by reapproximating the fascia with 2-0 Vicryl and the skin with 4-0 Vicryl.  Once hemostasis was satisfactory in the groin, the femoral sheath was reapproximated with 2-0 Vicryl.  An additional layer of 3-0 Vicryl was placed followed by subcuticular closure.  Dermabond was placed on all the incisions.  Next attention was turned towards the foot.  The fifth toe amputation site had necrotic edges which extended up to the fourth toe.  I made a racquet type incision incorporating the fourth toe with  a 10 blade.  This was carried down to the bone.  There was minimal bleeding.  The fourth toe was removed.  I inspected the wound and there was minimal bleeding and so I felt that removing the third toe was necessary.  This was done resecting the metatarsal head as well.  Rongeurs were used to debride back to healthy bone on all 3 toes.  A 10 blade was used to remove some of the subcutaneous tissue until I was able to get back to what appeared to be adequate bleeding.  The wound bed appeared healthy at this level however there was some superficial skin sloughing on the dorsum of the foot and anterior lower leg.  I elected not to close the incision but pack it with wet gauze.  Sterile dressings were applied.  Patient was then successfully extubated and taken to recovery in stable condition.  There were no immediate complications.   Disposition: To PACU stable.   [name], M.D., [facility] [place] Office: [phone] Pager:  [phone]

## 2021-06-10 NOTE — Anesthesia Preprocedure Evaluation (Addendum)
Anesthesia Evaluation  Patient identified by MRN, date of birth, ID band Patient awake    Reviewed: Allergy & Precautions, NPO status , Patient's Chart, lab work & pertinent test results, reviewed documented beta blocker date and time   Airway Mallampati: II  TM Distance: >3 FB Neck ROM: Full    Dental  (+) Dental Advisory Given, Missing   Pulmonary Current Smoker,    Pulmonary exam normal breath sounds clear to auscultation       Cardiovascular hypertension, Pt. on home beta blockers and Pt. on medications + Peripheral Vascular Disease  Normal cardiovascular exam Rhythm:Regular Rate:Normal     Neuro/Psych negative neurological ROS  negative psych ROS   GI/Hepatic negative GI ROS, Neg liver ROS,   Endo/Other  diabetes, Type 2, Oral Hypoglycemic Agents  Renal/GU negative Renal ROS     Musculoskeletal negative musculoskeletal ROS (+)   Abdominal   Peds  Hematology  (+) Blood dyscrasia, anemia ,   Anesthesia Other Findings Day of surgery medications reviewed with the patient.  Reproductive/Obstetrics                            Anesthesia Physical Anesthesia Plan  ASA: 3  Anesthesia Plan: General   Post-op Pain Management:    Induction: Intravenous  PONV Risk Score and Plan: 2 and Dexamethasone and Ondansetron  Airway Management Planned: Oral ETT  Additional Equipment: Arterial line  Intra-op Plan:   Post-operative Plan: Extubation in OR  Informed Consent: I have reviewed the patients History and Physical, chart, labs and discussed the procedure including the risks, benefits and alternatives for the proposed anesthesia with the patient or authorized representative who has indicated his/her understanding and acceptance.     Dental advisory given  Plan Discussed with: CRNA  Anesthesia Plan Comments:         Anesthesia Quick Evaluation

## 2021-06-10 NOTE — Transfer of Care (Signed)
Immediate Anesthesia Transfer of Care Note  Patient: Todd Duran  Procedure(s) Performed: RIGHT FEMORAL TO ABOVE KNEE POPLITEAL ARTERY BYPASS GRAFTING USING RIGHT NON REVERSED GREATER SAPHENOUS VEIN (Right: Leg Upper) RIGHT FOURTH TOE AMPUTATION (Right: Foot) VEIN HARVEST OF RIGHT GREATER SAPHENOUS VEIN (Right: Leg Upper)  Patient Location: PACU  Anesthesia Type:General  Level of Consciousness: drowsy and patient cooperative  Airway & Oxygen Therapy: Patient Spontanous Breathing  Post-op Assessment: Report given to RN and Post -op Vital signs reviewed and stable  Post vital signs: Reviewed and stable  Last Vitals:  Vitals Value Taken Time  BP 112/65 06/10/21 1554  Temp    Pulse 90 06/10/21 1601  Resp 16 06/10/21 1602  SpO2 100 % 06/10/21 1601  Vitals shown include unvalidated device data.  Last Pain:  Vitals:   06/10/21 1046  TempSrc:   PainSc: 0-No pain      Patients Stated Pain Goal: 0 (06/09/21 2127)  Complications: No notable events documented.

## 2021-06-11 ENCOUNTER — Encounter (HOSPITAL_COMMUNITY): Payer: Self-pay | Admitting: Surgery

## 2021-06-11 LAB — GLUCOSE, CAPILLARY
Glucose-Capillary: 215 mg/dL — ABNORMAL HIGH (ref 70–99)
Glucose-Capillary: 188 mg/dL — ABNORMAL HIGH (ref 70–99)
Glucose-Capillary: 165 mg/dL — ABNORMAL HIGH (ref 70–99)
Glucose-Capillary: 130 mg/dL — ABNORMAL HIGH (ref 70–99)

## 2021-06-11 LAB — BASIC METABOLIC PANEL
Anion gap: 11 (ref 5–15)
BUN: 22 mg/dL (ref 8–23)
CO2: 21 mmol/L — ABNORMAL LOW (ref 22–32)
Calcium: 9.2 mg/dL (ref 8.9–10.3)
Chloride: 97 mmol/L — ABNORMAL LOW (ref 98–111)
Creatinine, Ser: 0.97 mg/dL (ref 0.61–1.24)
GFR, Estimated: >60 mL/min (ref >60)
Glucose, Bld: 291 mg/dL — ABNORMAL HIGH (ref 70–99)
Potassium: 4.9 mmol/L (ref 3.5–5.1)
Sodium: 129 mmol/L — ABNORMAL LOW (ref 135–145)

## 2021-06-11 LAB — CBC
HCT: 21 % — ABNORMAL LOW (ref 39.0–52.0)
Hemoglobin: 6.5 g/dL — CL (ref 13.0–17.0)
MCH: 26.6 pg (ref 26.0–34.0)
MCHC: 31 g/dL (ref 30.0–36.0)
MCV: 86.1 fL (ref 80.0–100.0)
Platelets: 410 10*3/uL — ABNORMAL HIGH (ref 150–400)
RBC: 2.44 MIL/uL — ABNORMAL LOW (ref 4.22–5.81)
RDW: 17 % — ABNORMAL HIGH (ref 11.5–15.5)
WBC: 10 10*3/uL (ref 4.0–10.5)
nRBC: 0 % (ref 0.0–0.2)

## 2021-06-11 LAB — HEMOGLOBIN AND HEMATOCRIT, BLOOD
HCT: 28.4 % — ABNORMAL LOW (ref 39.0–52.0)
Hemoglobin: 9.4 g/dL — ABNORMAL LOW (ref 13.0–17.0)

## 2021-06-11 LAB — PREPARE RBC (CROSSMATCH)

## 2021-06-11 MED ORDER — SODIUM CHLORIDE 0.9% IV SOLUTION: Freq: Once | INTRAVENOUS | Status: AC

## 2021-06-11 NOTE — Progress Notes (Signed)
Inpatient Rehabilitation Admissions Coordinator   I await for further amputation needs to assist with planning dispo. Patient has been admitted twice to date for this episode of medical issues with [facility] Auth needed prior to readmit.  Ottie Glazier, RN, MSN Rehab Admissions Coordinator [phone] 06/11/2021 12:14 PM

## 2021-06-11 NOTE — Progress Notes (Addendum)
  Progress Note    06/11/2021 7:55 AM 1 Day Post-Op  Subjective:  no real complaints. Wants breakfast   Vitals:   06/11/21 0656 06/11/21 0722  BP: 113/71 110/69  Pulse: 93 95  Resp: 10 11  Temp: 98.3 F (36.8 C) 98.2 F (36.8 C)  SpO2: 100% 100%   Physical Exam: Cardiac:  regular Lungs:  non labored Incisions:  right groin and thigh incisions are clean, dry and intact. No hematoma or swelling Extremities:  Doppler right PT signals. Right Foot dressed. Clean and dry. Motor intact. Decreased sensation of 1st and 2nd toes. Duskiness of the 1st and second toes Neurologic: alert and oriented  CBC    Component Value Date/Time   WBC 10.0 06/11/2021 0212   RBC 2.44 (L) 06/11/2021 0212   HGB 6.5 (LL) 06/11/2021 0212   HCT 21.0 (L) 06/11/2021 0212   PLT 410 (H) 06/11/2021 0212   MCV 86.1 06/11/2021 0212   MCH 26.6 06/11/2021 0212   MCHC 31.0 06/11/2021 0212   RDW 17.0 (H) 06/11/2021 0212   LYMPHSABS 1.2 05/26/2021 0443   MONOABS 0.4 05/26/2021 0443   EOSABS 0.1 05/26/2021 0443   BASOSABS 0.0 05/26/2021 0443    BMET    Component Value Date/Time   NA 129 (L) 06/11/2021 0212   K 4.9 06/11/2021 0212   CL 97 (L) 06/11/2021 0212   CO2 21 (L) 06/11/2021 0212   GLUCOSE 291 (H) 06/11/2021 0212   BUN 22 06/11/2021 0212   CREATININE 0.97 06/11/2021 0212   CALCIUM 9.2 06/11/2021 0212   GFRNONAA >60 06/11/2021 0212    INR No results found for: INR   Intake/Output Summary (Last 24 hours) at 06/11/2021 0755 Last data filed at 06/11/2021 0500 Gross per 24 hour  Intake 1300 ml  Output 1100 ml  Net 200 ml     Assessment/Plan:  75 y.o. 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 At high risk for further amputation Dressings take down tomorrow Left AKA doing very well PT/OT eval  Graceann Congress, PA-C [facility] [phone] 06/11/2021 7:55 AM  I agree with the above.  I have seen and evaluated the patient.  His bypass graft is patent by Doppler exam.  His incisions are healing nicely.  I am concerned about the appearance of his remaining toes and his foot.  I discussed that he is at risk for more proximal amputation, however we will let this demarcate now that he has better blood flow.  At some point, I will need to start him on Plavix for his iliac stent however with the possibility of additional surgery, I will hold off for now.  [name]

## 2021-06-11 NOTE — Progress Notes (Signed)
Occupational Therapy Treatment Patient Details Name: Todd Duran MRN: [id number] DOB: [birth year] Today's Date: 06/11/2021    History of present illness Pt is a 75 yo male admitted on 7/29 from CIR due to R lower limb ischemia. Pt underwent R femoral endarterectomy, R iliac & superficial fem art stent, R 5th toe amp. 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 PMH: L AKA PMH: DM, HTN   OT comments  Planned to progress OOB activities and standing tolerance for ADLs s/p surgery. However, pt self limiting due to pain and confusion. Pt overall Mod A for bed mobility, declined further transfer attempts despite encouragement and environmental modifications. Pt reports still interested in rehab to maximize independence - educated on need for optimal participation in next session to facilitate goal achievement.    Follow Up Recommendations  CIR    Equipment Recommendations  Tub/shower bench;3 in 1 bedside commode;Wheelchair (measurements OT);Wheelchair cushion (measurements OT)    Recommendations for Other Services Rehab consult    Precautions / Restrictions Precautions Precautions: Fall Precaution Comments: L aka, R 5th Required Braces or Orthoses: Other Brace Other Brace: R darco shoe Restrictions Weight Bearing Restrictions: Yes RLE Weight Bearing: Weight bearing as tolerated LLE Weight Bearing: Non weight bearing Other Position/Activity Restrictions: no formal orders for RLE in chart though darco shoe noted in room. Assumed WB through heel       Mobility Bed Mobility Overal bed mobility: Needs Assistance Bed Mobility: Supine to Sit;Sit to Supine     Supine to sit: Mod assist Sit to supine: Mod assist   General bed mobility comments: max direction cues, lots of encouragement, Attempting different methods of assist to progress unsuccessfully toward EOB.  Pt only able to come upright pulling up with both UE's, pt unable to comefully upright  with bil UE's on bed.    Transfers                 General transfer comment: pt deferred OOB or standing, eventually refusing assist of therapists and asking to return to supine.    Balance Overall balance assessment: Needs assistance Sitting-balance support: Feet unsupported;Single extremity supported;Bilateral upper extremity supported Sitting balance-Leahy Scale: Poor Sitting balance - Comments: reliant on both UE support and external assist                                   ADL either performed or assessed with clinical judgement   ADL Overall ADL's : Needs assistance/impaired                                       General ADL Comments: Session focused on progressing OOB activities and trial of darco shoe though pt self limiting due to pain and confusion.     Vision   Vision Assessment?: No apparent visual deficits   Perception     Praxis      Cognition Arousal/Alertness: Awake/alert Behavior During Therapy: WFL for tasks assessed/performed;Anxious Overall Cognitive Status: Impaired/Different from baseline Area of Impairment: Safety/judgement;Awareness;Problem solving;Memory                     Memory: Decreased short-term memory Following Commands: Follows one step commands with increased time Safety/Judgement: Decreased awareness of safety;Decreased awareness of deficits Awareness: Intellectual Problem Solving: Decreased initiation;Difficulty sequencing;Requires verbal  cues General Comments: Pt with some confusion noted, attempting to dictate session requesting therapists to "raise the chair up". redirect and educated on situation/room setup.        Exercises Exercises: Other exercises Other Exercises Other Exercises: initiated warm up ROM, but pt shouted to stop after 1-2 reps and would not let therapist handle the leg further.   Shoulder Instructions       General Comments      Pertinent Vitals/ Pain        Pain Assessment: Faces Faces Pain Scale: Hurts even more Pain Location: R foot/leg Pain Descriptors / Indicators: Grimacing;Guarding;Discomfort;Sore Pain Intervention(s): Monitored during session  Home Living                                          Prior Functioning/Environment              Frequency  Min 2X/week        Progress Toward Goals  OT Goals(current goals can now be found in the care plan section)  Progress towards OT goals: OT to reassess next treatment  Acute Rehab OT Goals Patient Stated Goal: go home OT Goal Formulation: With patient Time For Goal Achievement: 06/20/21 Potential to Achieve Goals: Good ADL Goals Pt Will Perform Grooming: with modified independence;sitting Pt Will Perform Lower Body Bathing: with modified independence;sitting/lateral leans;with adaptive equipment Pt Will Perform Lower Body Dressing: with set-up;with adaptive equipment;sitting/lateral leans Pt Will Transfer to Toilet: with supervision;bedside commode Pt Will Perform Toileting - Clothing Manipulation and hygiene: with modified independence;sitting/lateral leans Pt Will Perform Tub/Shower Transfer: Tub transfer;with supervision;tub bench Additional ADL Goal #1: Pt to verbalize at least 2 fall prevention strategies to maximize safety during ADLs/transfers  Plan Discharge plan remains appropriate    Co-evaluation    PT/OT/SLP Co-Evaluation/Treatment: Yes Reason for Co-Treatment: To address functional/ADL transfers;For patient/therapist safety PT goals addressed during session: Mobility/safety with mobility OT goals addressed during session: ADL's and self-care      AM-PAC OT "6 Clicks" Daily Activity     Outcome Measure   Help from another person eating meals?: A Little Help from another person taking care of personal grooming?: A Little Help from another person toileting, which includes using toliet, bedpan, or urinal?: A Lot Help from another  person bathing (including washing, rinsing, drying)?: A Lot Help from another person to put on and taking off regular upper body clothing?: A Little Help from another person to put on and taking off regular lower body clothing?: A Lot 6 Click Score: 15    End of Session    OT Visit Diagnosis: Unsteadiness on feet (R26.81);Other abnormalities of gait and mobility (R26.89);Muscle weakness (generalized) (M62.81);Pain;Other symptoms and signs involving cognitive function Pain - Right/Left: Right Pain - part of body: Leg   Activity Tolerance Other (comment);Patient limited by pain (limited by cognition)   Patient Left in bed;with call bell/phone within reach;with bed alarm set   Nurse Communication Mobility status        Time: 1660-6301 OT Time Calculation (min): 23 min  Charges: OT General Charges $OT Visit: 1 Visit OT Treatments $Therapeutic Activity: 8-22 mins  Bradd Canary, OTR/L Acute Rehab Services Office: [phone]    Lorre Munroe 06/11/2021, 12:21 PM

## 2021-06-11 NOTE — Anesthesia Postprocedure Evaluation (Signed)
Anesthesia Post Note  Patient: Todd Duran  Procedure(s) Performed: RIGHT FEMORAL TO ABOVE KNEE POPLITEAL ARTERY BYPASS GRAFTING USING RIGHT NON REVERSED GREATER SAPHENOUS VEIN (Right: Leg Upper) RIGHT THIRD AND  FOURTH TOE AMPUTATION (Right: Foot) VEIN HARVEST OF RIGHT GREATER SAPHENOUS VEIN (Right: Leg Upper)     Patient location during evaluation: PACU Anesthesia Type: General Level of consciousness: awake Pain management: pain level controlled Vital Signs Assessment: post-procedure vital signs reviewed and stable Respiratory status: spontaneous breathing, nonlabored ventilation, respiratory function stable and patient connected to nasal cannula oxygen Cardiovascular status: blood pressure returned to baseline and stable Postop Assessment: no apparent nausea or vomiting Anesthetic complications: no   No notable events documented.  Last Vitals:  Vitals:   06/11/21 1400 06/11/21 1949  BP: 130/79 117/75  Pulse: 98 94  Resp: 16 15  Temp: 36.9 C 36.8 C  SpO2: 100% 96%    Last Pain:  Vitals:   06/11/21 1950  TempSrc:   PainSc: 0-No pain                 Cecile Hearing

## 2021-06-11 NOTE — Progress Notes (Signed)
Physical Therapy Treatment Patient Details Name: Todd Duran MRN: [id number] DOB: 02-01-1946 Today's Date: 06/11/2021    History of Present Illness Pt is a 75 yo male admitted on 7/29 from CIR due to R lower limb ischemia. Pt underwent R femoral endarterectomy, R iliac & superficial fem art stent, R 5th toe amp. PSH: L AKA PMH: DM, HTN    PT Comments    Limited session due to pt hurting and not interested in participating once he experienced the pain.  Plan was for OOB and standing trials, but did not fully reach the EOB with full upright sitting after a good amount of time trying to encourage.    Follow Up Recommendations  CIR     Equipment Recommendations  Wheelchair (measurements PT);Wheelchair cushion (measurements PT)    Recommendations for Other Services Rehab consult     Precautions / Restrictions Precautions Precautions: Fall Precaution Comments: L aka, R 5th Required Braces or Orthoses: Other Brace Other Brace: R darco shoe Restrictions RLE Weight Bearing: Weight bearing as tolerated    Mobility  Bed Mobility Overal bed mobility: Needs Assistance Bed Mobility: Supine to Sit;Sit to Supine     Supine to sit: Mod assist Sit to supine: Mod assist   General bed mobility comments: max direction cues, lots of encouragement, Attempting different methods of assist to progress unsuccessfully toward EOB.  Pt only able to come upright pulling up with both UE's, pt unable to comefully upright with bil UE's on bed.    Transfers                 General transfer comment: pt deferred OOB or standing, eventually refusing assist of therapists and asking to return to supine.  Ambulation/Gait                 Stairs             Wheelchair Mobility    Modified Rankin (Stroke Patients Only)       Balance Overall balance assessment: Needs assistance Sitting-balance support: Feet unsupported;Single extremity supported;Bilateral upper extremity  supported Sitting balance-Leahy Scale: Poor Sitting balance - Comments: reliant on both UE support and external assist                                    Cognition Arousal/Alertness: Awake/alert Behavior During Therapy: WFL for tasks assessed/performed;Anxious Overall Cognitive Status: Impaired/Different from baseline                       Memory: Decreased short-term memory Following Commands: Follows one step commands with increased time Safety/Judgement: Decreased awareness of safety;Decreased awareness of deficits Awareness: Intellectual Problem Solving: Decreased initiation;Difficulty sequencing;Requires verbal cues        Exercises Other Exercises Other Exercises: initiated warm up ROM, but pt shouted to stop after 1-2 reps and would not let therapist handle the leg further.    General Comments        Pertinent Vitals/Pain Pain Assessment: Faces Faces Pain Scale: Hurts even more Pain Location: R foot/leg Pain Descriptors / Indicators: Grimacing;Guarding;Discomfort;Sore Pain Intervention(s): Monitored during session;Repositioned;Limited activity within patient's tolerance    Home Living                      Prior Function            PT Goals (current goals can now be found in the  care plan section) Acute Rehab PT Goals Patient Stated Goal: go home PT Goal Formulation: With patient Time For Goal Achievement: 06/19/21 Potential to Achieve Goals: Fair Progress towards PT goals: Not progressing toward goals - comment (pt refusing to work with therapies today due to pain)    Frequency    Min 3X/week      PT Plan Current plan remains appropriate    Co-evaluation PT/OT/SLP Co-Evaluation/Treatment: Yes Reason for Co-Treatment: For patient/therapist safety;To address functional/ADL transfers PT goals addressed during session: Mobility/safety with mobility        AM-PAC PT "6 Clicks" Mobility   Outcome Measure  Help  needed turning from your back to your side while in a flat bed without using bedrails?: A Lot Help needed moving from lying on your back to sitting on the side of a flat bed without using bedrails?: A Lot Help needed moving to and from a bed to a chair (including a wheelchair)?: A Lot Help needed standing up from a chair using your arms (e.g., wheelchair or bedside chair)?: Total Help needed to walk in hospital room?: Total Help needed climbing 3-5 steps with a railing? : Total 6 Click Score: 9    End of Session   Activity Tolerance: Patient limited by pain Patient left: in bed;with call bell/phone within reach;with bed alarm set Nurse Communication: Mobility status PT Visit Diagnosis: Other abnormalities of gait and mobility (R26.89);Pain Pain - Right/Left: Right     Time: 1594-5859 PT Time Calculation (min) (ACUTE ONLY): 33 min  Charges:  $Therapeutic Activity: 8-22 mins                     06/11/2021  Jacinto Halim., PT Acute Rehabilitation Services [phone]  (pager) [phone]  (office)   [name] 06/11/2021, 11:42 AM

## 2021-06-12 LAB — BPAM RBC
Blood Product Expiration Date: 202209062359
Blood Product Expiration Date: 202209062359
ISSUE DATE / TIME: 202208040617
ISSUE DATE / TIME: 202208041139
Unit Type and Rh: 5100
Unit Type and Rh: 5100

## 2021-06-12 LAB — TYPE AND SCREEN
ABO/RH(D): O POS
Antibody Screen: NEGATIVE
Unit division: 0
Unit division: 0

## 2021-06-12 LAB — GLUCOSE, CAPILLARY
Glucose-Capillary: 164 mg/dL — ABNORMAL HIGH (ref 70–99)
Glucose-Capillary: 168 mg/dL — ABNORMAL HIGH (ref 70–99)
Glucose-Capillary: 118 mg/dL — ABNORMAL HIGH (ref 70–99)

## 2021-06-12 NOTE — Progress Notes (Addendum)
  Progress Note    06/12/2021 7:31 AM 2 Days Post-Op  Subjective:  R GT pain   Vitals:   06/12/21 0300 06/12/21 0341  BP:  127/75  Pulse: 98 98  Resp:  11  Temp:  98.2 F (36.8 C)  SpO2: 94% 96%   Physical Exam: Lungs:  non labored Incisions:  R LE incisions c/d/i Extremities:  brisk PT by doppler; 2+ popliteal pulse; dressing left in place R foot Neurologic: A&O  CBC    Component Value Date/Time   WBC 10.0 06/11/2021 0212   RBC 2.44 (L) 06/11/2021 0212   HGB 9.4 (L) 06/11/2021 1925   HCT 28.4 (L) 06/11/2021 1925   PLT 410 (H) 06/11/2021 0212   MCV 86.1 06/11/2021 0212   MCH 26.6 06/11/2021 0212   MCHC 31.0 06/11/2021 0212   RDW 17.0 (H) 06/11/2021 0212   LYMPHSABS 1.2 05/26/2021 0443   MONOABS 0.4 05/26/2021 0443   EOSABS 0.1 05/26/2021 0443   BASOSABS 0.0 05/26/2021 0443    BMET    Component Value Date/Time   NA 129 (L) 06/11/2021 0212   K 4.9 06/11/2021 0212   CL 97 (L) 06/11/2021 0212   CO2 21 (L) 06/11/2021 0212   GLUCOSE 291 (H) 06/11/2021 0212   BUN 22 06/11/2021 0212   CREATININE 0.97 06/11/2021 0212   CALCIUM 9.2 06/11/2021 0212   GFRNONAA >60 06/11/2021 0212    INR No results found for: INR   Intake/Output Summary (Last 24 hours) at 06/12/2021 0731 Last data filed at 06/12/2021 0700 Gross per 24 hour  Intake 860 ml  Output 1250 ml  Net -390 ml     Assessment/Plan:  75 y.o. male is s/p R femoral to AK popliteal bypass with 4th toe amp 2 Days Post-Op   -Patent bypass with 2+ popliteal pulse and brisk PTA signal by doppler -Patient eating breakfast this morning but will need wet to dry packing change of R foot later this morning -Will allow time for toes to demarcate; patient at risk for BKA -continue to hold plavix for now -Encouraged participation with therapy teams    Emilie Rutter, PA-C [facility] [phone] 06/12/2021 7:31 AM   I agree with the above.  I have seen and evaluated patient.  He is  postoperative day 2 from a femoropopliteal bypass.  His dressing was changed today.  There is very poor healing at his foot amputation site with ischemic changes going up onto the dorsum of the foot.  I discussed with the patient that he is at extremely high risk for BKA versus AKA.  I would like to give him through the weekend to let this demarcate.  [name]

## 2021-06-12 NOTE — Progress Notes (Signed)
Physical Therapy Treatment Patient Details Name: Todd Duran MRN: [id number] DOB: 17-Jun-1946 Today's Date: 06/12/2021    History of Present Illness Pt is a 75 yo male admitted on 7/29 from CIR due to R lower limb ischemia. Pt underwent R femoral endarterectomy, R iliac & superficial fem art stent, R 5th toe amp. 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 PMH: L AKA PMH: DM, HTN    PT Comments     Reviewed chart and noted pt in danger of needing R BKA or AKA if foot does not improve.  Spoke with RN who recommended not standing on R leg - PT agrees.  Pt able to perform lateral scoot to chair with min A of 2 and keeping R foot elevated.  Unable to tolerate R foot down. Pt needs max cues for transfer techniques.  Continue plan of care.    Follow Up Recommendations  CIR     Equipment Recommendations  Wheelchair (measurements PT);Wheelchair cushion (measurements PT)    Recommendations for Other Services Rehab consult     Precautions / Restrictions Precautions Precautions: Fall Precaution Comments: L aka, R 5th Required Braces or Orthoses: Other Brace Other Brace: R darco shoe Restrictions RLE Weight Bearing: Weight bearing as tolerated LLE Weight Bearing: Non weight bearing    Mobility  Bed Mobility Overal bed mobility: Needs Assistance Bed Mobility: Supine to Sit     Supine to sit: Min assist     General bed mobility comments: Min A to sit EOB with increased time and cues for hand placement.  Unable to tolerate lowering R foot - therapist kept elevated    Transfers Overall transfer level: Needs assistance   Transfers: Lateral/Scoot Transfers          Lateral/Scoot Transfers: Min assist;+2 physical assistance;+2 safety/equipment General transfer comment: Deferred standing due to pt in danger of R BKA or AKA per notes -also not tolerating foot down.  Performed lateral scoot to chair with cues for hand placement, therapist  keeping foot elevated, and use of bed pad to facilitate, increased time.  Ambulation/Gait                 Stairs             Wheelchair Mobility    Modified Rankin (Stroke Patients Only)       Balance Overall balance assessment: Needs assistance Sitting-balance support: Feet unsupported;No upper extremity supported Sitting balance-Leahy Scale: Fair                                      Cognition Arousal/Alertness: Awake/alert Behavior During Therapy: WFL for tasks assessed/performed Overall Cognitive Status: Impaired/Different from baseline Area of Impairment: Awareness;Problem solving;Memory                     Memory: Decreased short-term memory Following Commands: Follows one step commands consistently;Follows multi-step commands inconsistently   Awareness: Intellectual Problem Solving: Difficulty sequencing;Requires verbal cues;Requires tactile cues        Exercises General Exercises - Lower Extremity Ankle Circles/Pumps: AROM;Right;10 reps;Seated Quad Sets: AROM;Right;10 reps;Seated Heel Slides: AAROM;Right;10 reps;Seated Hip ABduction/ADduction: AAROM;Right;AROM;Left;10 reps;Seated Straight Leg Raises: AROM;Left;10 reps;Seated Other Exercises Other Exercises: Exercises on R to tolerance, cues for correct motions Other Exercises: 5x partial tricep push down bil unable to completely lift buttock and requiring cues for hand placement    General  Comments General comments (skin integrity, edema, etc.): VSS      Pertinent Vitals/Pain Pain Assessment: Faces Faces Pain Scale: Hurts little more (Initially reported no pain, but did grimace with transfers and requested pain med post tx) Pain Location: R foot/leg and chronic back Pain Descriptors / Indicators: Grimacing;Guarding;Discomfort;Sore Pain Intervention(s): Limited activity within patient's tolerance;Monitored during session;Repositioned;Patient requesting pain meds-RN  notified    Home Living                      Prior Function            PT Goals (current goals can now be found in the care plan section) Acute Rehab PT Goals Patient Stated Goal: go home PT Goal Formulation: With patient Time For Goal Achievement: 06/19/21 Potential to Achieve Goals: Good Progress towards PT goals: Progressing toward goals    Frequency    Min 3X/week      PT Plan Current plan remains appropriate    Co-evaluation              AM-PAC PT "6 Clicks" Mobility   Outcome Measure  Help needed turning from your back to your side while in a flat bed without using bedrails?: A Little Help needed moving from lying on your back to sitting on the side of a flat bed without using bedrails?: A Little Help needed moving to and from a bed to a chair (including a wheelchair)?: A Lot Help needed standing up from a chair using your arms (e.g., wheelchair or bedside chair)?: Total Help needed to walk in hospital room?: Total Help needed climbing 3-5 steps with a railing? : Total 6 Click Score: 11    End of Session Equipment Utilized During Treatment: Gait belt Activity Tolerance: Patient tolerated treatment well Patient left: with call bell/phone within reach;with chair alarm set;in chair Nurse Communication: Mobility status (lateral scoot) PT Visit Diagnosis: Other abnormalities of gait and mobility (R26.89);Pain     Time: 1200-1229 PT Time Calculation (min) (ACUTE ONLY): 29 min  Charges:  $Therapeutic Exercise: 8-22 mins $Therapeutic Activity: 8-22 mins                     Anise Salvo, PT Acute Rehab Services Pager [phone] [facility] [phone]    Rayetta Humphrey 06/12/2021, 12:55 PM

## 2021-06-13 LAB — GLUCOSE, CAPILLARY
Glucose-Capillary: 123 mg/dL — ABNORMAL HIGH (ref 70–99)
Glucose-Capillary: 181 mg/dL — ABNORMAL HIGH (ref 70–99)
Glucose-Capillary: 121 mg/dL — ABNORMAL HIGH (ref 70–99)
Glucose-Capillary: 152 mg/dL — ABNORMAL HIGH (ref 70–99)

## 2021-06-13 NOTE — Progress Notes (Signed)
Mobility Specialist: Progress Note   06/13/21 1739  Mobility  Activity Transferred:  Chair to bed  Level of Assistance Minimal assist, patient does 75% or more  Assistive Device Sliding board  Mobility Out of bed to chair with meals  Mobility Response Tolerated well  Mobility performed by Mobility specialist  $Mobility charge 1 Mobility    Post-Mobility: 91 HR, 137/82 BP  Pt assisted back to bed per request. Pt required minA to scoot using pad, asx throughout. Pt back in bed with bed alarm on.   [facility] Vallen Calabrese Mobility Specialist Mobility Specialist Phone: [phone]

## 2021-06-13 NOTE — Progress Notes (Addendum)
  Progress Note    06/13/2021 8:46 AM 3 Days Post-Op  Subjective:  no new complaints   Vitals:   06/13/21 0338 06/13/21 0812  BP: 127/79 108/65  Pulse: 97 100  Resp: 17 17  Temp: 98.7 F (37.1 C) 98.6 F (37 C)  SpO2: 98% 97%   Physical Exam: Lungs:  non labored Incisions:  RLE incisions healing well; dusky toe amps and dorsal foot; L AKA healing well  Extremities:  2+ popliteal pulse Neurologic: A&O  CBC    Component Value Date/Time   WBC 10.0 06/11/2021 0212   RBC 2.44 (L) 06/11/2021 0212   HGB 9.4 (L) 06/11/2021 1925   HCT 28.4 (L) 06/11/2021 1925   PLT 410 (H) 06/11/2021 0212   MCV 86.1 06/11/2021 0212   MCH 26.6 06/11/2021 0212   MCHC 31.0 06/11/2021 0212   RDW 17.0 (H) 06/11/2021 0212   LYMPHSABS 1.2 05/26/2021 0443   MONOABS 0.4 05/26/2021 0443   EOSABS 0.1 05/26/2021 0443   BASOSABS 0.0 05/26/2021 0443    BMET    Component Value Date/Time   NA 129 (L) 06/11/2021 0212   K 4.9 06/11/2021 0212   CL 97 (L) 06/11/2021 0212   CO2 21 (L) 06/11/2021 0212   GLUCOSE 291 (H) 06/11/2021 0212   BUN 22 06/11/2021 0212   CREATININE 0.97 06/11/2021 0212   CALCIUM 9.2 06/11/2021 0212   GFRNONAA >60 06/11/2021 0212    INR No results found for: INR   Intake/Output Summary (Last 24 hours) at 06/13/2021 0846 Last data filed at 06/13/2021 0626 Gross per 24 hour  Intake 360 ml  Output 925 ml  Net -565 ml     Assessment/Plan:  75 y.o. male is s/p L AKA and R fem-pop with toe amps 3 Days Post-Op   Patent R fem pop with palpable popliteal pulse however extensive tissue changes of R foot; high risk for BKA L AKA healing well Continue participation with therapy teams   Emilie Rutter, PA-C [facility] [phone] 06/13/2021 8:46 AM  VASCULAR STAFF ADDENDUM: I have independently interviewed and examined the patient. I agree with the above.  Will allow RLE to demarcate over the weekend. No issues from bypass standpoint.  Rande Brunt.  Lenell Antu, MD [facility] Phone Number: [phone] 06/13/2021 9:39 AM

## 2021-06-14 LAB — GLUCOSE, CAPILLARY
Glucose-Capillary: 139 mg/dL — ABNORMAL HIGH (ref 70–99)
Glucose-Capillary: 134 mg/dL — ABNORMAL HIGH (ref 70–99)
Glucose-Capillary: 138 mg/dL — ABNORMAL HIGH (ref 70–99)
Glucose-Capillary: 160 mg/dL — ABNORMAL HIGH (ref 70–99)

## 2021-06-14 NOTE — Progress Notes (Addendum)
  Progress Note    06/14/2021 8:34 AM 4 Days Post-Op  Subjective:  no new complaints   Vitals:   06/14/21 0415 06/14/21 0753  BP: (!) 148/81 120/73  Pulse:  100  Resp: 20 17  Temp: 98.6 F (37 C) 98.2 F (36.8 C)  SpO2: 98% 100%   Physical Exam: Lungs:  non labored Incisions:  R groin and leg incisions c/d/I; necrotic toes and toe amp site; L AKA with some darkening medially Extremities:  PT by doppler Neurologic: A&O  CBC    Component Value Date/Time   WBC 10.0 06/11/2021 0212   RBC 2.44 (L) 06/11/2021 0212   HGB 9.4 (L) 06/11/2021 1925   HCT 28.4 (L) 06/11/2021 1925   PLT 410 (H) 06/11/2021 0212   MCV 86.1 06/11/2021 0212   MCH 26.6 06/11/2021 0212   MCHC 31.0 06/11/2021 0212   RDW 17.0 (H) 06/11/2021 0212   LYMPHSABS 1.2 05/26/2021 0443   MONOABS 0.4 05/26/2021 0443   EOSABS 0.1 05/26/2021 0443   BASOSABS 0.0 05/26/2021 0443    BMET    Component Value Date/Time   NA 129 (L) 06/11/2021 0212   K 4.9 06/11/2021 0212   CL 97 (L) 06/11/2021 0212   CO2 21 (L) 06/11/2021 0212   GLUCOSE 291 (H) 06/11/2021 0212   BUN 22 06/11/2021 0212   CREATININE 0.97 06/11/2021 0212   CALCIUM 9.2 06/11/2021 0212   GFRNONAA >60 06/11/2021 0212    INR No results found for: INR   Intake/Output Summary (Last 24 hours) at 06/14/2021 0834 Last data filed at 06/14/2021 0617 Gross per 24 hour  Intake 480 ml  Output 900 ml  Net -420 ml     Assessment/Plan:  75 y.o. male is s/p L AKA, and R femoral to AK popliteal bypass with toe amps 4 Days Post-Op   Necrotic appearing toes and toe amp site despite 2+ R popliteal pulse Patient is aware of high risk for RLE amputation Encouraged nutrition Dr. Myra Gianotti to evaluate the RLE tomorrow   Emilie Rutter, PA-C [facility] [phone] 06/14/2021 8:34 AM  VASCULAR STAFF ADDENDUM: I have independently interviewed and examined the patient. I agree with the above.   Rande Brunt. Lenell Antu, MD [facility] Phone Number: [phone] 06/14/2021 10:06 AM

## 2021-06-14 NOTE — Progress Notes (Signed)
Mobility Specialist: Progress Note   06/14/21 1705  Mobility  Activity Dangled on edge of bed  Level of Assistance Independent  Assistive Device None  Mobility Out of bed to chair with meals  Mobility Response Tolerated well  Mobility performed by Mobility specialist  $Mobility charge 1 Mobility   Pre-Mobility: 91 HR, 99% SpO2 Post-Mobility: 89 HR  Pt independent with bed mobility. Pt differed OOB to the chair but was agreeable to LE exercises on EOB. Session limited d/t R foot pain, no rating given, with pt requesting to get back in the bed. Pt back in bed with call bell and phone at his side.   [facility] Fontella Shan Mobility Specialist Mobility Specialist Phone: [phone]

## 2021-06-15 LAB — GLUCOSE, CAPILLARY
Glucose-Capillary: 133 mg/dL — ABNORMAL HIGH (ref 70–99)
Glucose-Capillary: 161 mg/dL — ABNORMAL HIGH (ref 70–99)
Glucose-Capillary: 150 mg/dL — ABNORMAL HIGH (ref 70–99)
Glucose-Capillary: 148 mg/dL — ABNORMAL HIGH (ref 70–99)

## 2021-06-15 MED ORDER — DAKINS (1/4 STRENGTH) 0.125 % EX SOLN
Freq: Two times a day (BID) | CUTANEOUS | Status: AC
  Filled 2021-06-15 (×2): qty 473

## 2021-06-15 NOTE — Progress Notes (Signed)
Mobility Specialist: Progress Note   06/15/21 1409  Mobility  Activity Dangled on edge of bed  Level of Assistance Independent  Assistive Device None  Mobility Sit up in bed/chair position for meals  Mobility Response Tolerated well  Mobility performed by Mobility specialist  $Mobility charge 1 Mobility   Pre-Mobility: 99 HR Post-Mobility: 101 HR, 123/77 BP, 98% SpO2  Pt independent with bed mobility. Pt agreeable to LE exercises d/t not wanting to put weight on R foot. Pt c/o 10/10 pain during exercises, RN notified. Pt back in bed with bed alarm on. Pt has call bell at his side and pt's wife is present in the room.   [facility] Avik Leoni Mobility Specialist Mobility Specialist Phone: [phone]

## 2021-06-15 NOTE — Progress Notes (Signed)
Physical Therapy Treatment Patient Details Name: Todd Duran MRN: [id number] DOB: [birth year] Today's Date: 06/15/2021    History of Present Illness Pt is a 75 yo male admitted on 7/29 from CIR due to R lower limb ischemia. Pt underwent R femoral endarterectomy, R iliac & superficial fem art stent, R 5th toe amp. 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 PMH: L AKA (05/13/21), DM, HTN    PT Comments    Pt received in bed. Minimal participation in therapy due to pain. Declining OOB, pt stating "I don't want to do anything to risk losing this leg." Pt educated that mobility is beneficial but continued to decline. Participated in LE exercises but pt ceased with any/all exacerbations of pain. Assisted pt with scooting up in bed at end of session.    Follow Up Recommendations  CIR     Equipment Recommendations  Wheelchair (measurements PT);Wheelchair cushion (measurements PT)    Recommendations for Other Services       Precautions / Restrictions Precautions Precautions: Fall Precaution Comments: L aka, R 5th amp Required Braces or Orthoses: Other Brace Other Brace: R darco shoe Restrictions RLE Weight Bearing: Weight bearing as tolerated LLE Weight Bearing: Non weight bearing Other Position/Activity Restrictions: no formal orders for RLE in chart though darco shoe noted in room. Assumed WB through heel (RN 8/5 recommended RLE NWB due to high risk for BKA/AKA.)    Mobility  Bed Mobility                    Transfers                    Ambulation/Gait                 Stairs             Wheelchair Mobility    Modified Rankin (Stroke Patients Only)       Balance                                            Cognition Arousal/Alertness: Awake/alert Behavior During Therapy: WFL for tasks assessed/performed Overall Cognitive Status: Impaired/Different from baseline Area of Impairment:  Awareness;Problem solving;Memory                     Memory: Decreased short-term memory     Awareness: Intellectual Problem Solving: Difficulty sequencing;Requires verbal cues;Requires tactile cues;Slow processing General Comments: decreased medical literacy      Exercises General Exercises - Lower Extremity Ankle Circles/Pumps: AROM;Right;10 reps;Supine Gluteal Sets: AROM;Both;10 reps;Supine Heel Slides: AAROM;Right;5 reps;Supine Hip ABduction/ADduction: AROM;AAROM;Right;Left;10 reps;Supine    General Comments        Pertinent Vitals/Pain Pain Assessment: Faces Faces Pain Scale: Hurts whole lot Pain Location: RLE with mobility Pain Descriptors / Indicators: Grimacing;Guarding;Discomfort;Sore Pain Intervention(s): Limited activity within patient's tolerance;Repositioned;Monitored during session    Home Living                      Prior Function            PT Goals (current goals can now be found in the care plan section) Acute Rehab PT Goals Patient Stated Goal: avoid RLE amputation Progress towards PT goals: Not progressing toward goals - comment (pain limiting participation)    Frequency  Min 3X/week      PT Plan Current plan remains appropriate    Co-evaluation              AM-PAC PT "6 Clicks" Mobility   Outcome Measure  Help needed turning from your back to your side while in a flat bed without using bedrails?: A Little Help needed moving from lying on your back to sitting on the side of a flat bed without using bedrails?: A Little Help needed moving to and from a bed to a chair (including a wheelchair)?: A Lot Help needed standing up from a chair using your arms (e.g., wheelchair or bedside chair)?: Total Help needed to walk in hospital room?: Total Help needed climbing 3-5 steps with a railing? : Total 6 Click Score: 11    End of Session   Activity Tolerance: Patient limited by pain Patient left: in bed;with call  bell/phone within reach;with bed alarm set Nurse Communication: Mobility status PT Visit Diagnosis: Other abnormalities of gait and mobility (R26.89);Pain Pain - Right/Left: Right Pain - part of body: Leg     Time: 0174-9449 PT Time Calculation (min) (ACUTE ONLY): 11 min  Charges:  $Therapeutic Exercise: 8-22 mins                     Aida Raider, PT  Office # [phone] Pager [phone]    Ilda Foil 06/15/2021, 9:18 AM

## 2021-06-15 NOTE — Progress Notes (Signed)
Focus of session on UE strengthening with level 3 theraband. Tolerated well.    06/15/21 1500  OT Visit Information  Last OT Received On 06/15/21  Assistance Needed +2 (OOB/safety)  History of Present Illness Pt is a 75 yo male admitted on 7/29 from CIR due to R lower limb ischemia. Pt underwent R femoral endarterectomy, R iliac & superficial fem art stent, R 5th toe amp. 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 PMH: L AKA (05/13/21), DM, HTN  Precautions  Precautions Fall  Precaution Comments L aka, R 5th amp  Required Braces or Orthoses Other Brace  Other Brace R darco shoe  Pain Assessment  Pain Assessment Faces  Faces Pain Scale 8  Pain Location RLE with mobility  Pain Descriptors / Indicators Grimacing;Guarding;Discomfort;Sore  Pain Intervention(s) Monitored during session;Repositioned  Cognition  Arousal/Alertness Awake/alert  Behavior During Therapy Flat affect  General Comments pt aware he is at risk of losing his R LE, following simple commands for UB strengthening  Restrictions  Other Position/Activity Restrictions no formal orders for RLE in chart though darco shoe noted in room. Assumed WB through heel (RN 8/5 recommended RLE NWB due to high risk for BKA/AKA.)  Transfers  General transfer comment pt declined  Exercises  Exercises General Upper Extremity  General Exercises - Upper Extremity  Shoulder Extension Strengthening;Both;10 reps;Supine;Theraband  Shoulder Horizontal ABduction Strengthening;Both;10 reps;Supine;Theraband  Elbow Extension Strengthening;Both;10 reps;Supine;Theraband  Shoulder Flexion Strengthening;Both;10 reps;Supine;Theraband  Elbow Flexion Strengthening;Both;10 reps;Supine;Theraband  Theraband Level (Shoulder Flexion) Level 3 (Green)  Theraband Level (Shoulder Extension) Level 3 (Green)  Theraband Level (Shoulder Horizontal Abduction) Level 3 (Green)  Theraband Level (Elbow Flexion) Level 3 (Green)   Theraband Level (Elbow Extension) Level 3 (Green)  OT - End of Session  Activity Tolerance Patient tolerated treatment well  Patient left in bed;with call bell/phone within reach;with bed alarm set  Nurse Communication Mobility status  OT Assessment/Plan  OT Plan Discharge plan remains appropriate  OT Visit Diagnosis Unsteadiness on feet (R26.81);Other abnormalities of gait and mobility (R26.89);Muscle weakness (generalized) (M62.81);Pain;Other symptoms and signs involving cognitive function  Pain - Right/Left Right  Pain - part of body Leg  OT Frequency (ACUTE ONLY) Min 2X/week  Follow Up Recommendations CIR  OT Equipment Tub/shower bench;Wheelchair (measurements OT);Wheelchair cushion (measurements OT) (drop arm commode)  AM-PAC OT "6 Clicks" Daily Activity Outcome Measure (Version 2)  Help from another person eating meals? 4  Help from another person taking care of personal grooming? 3  Help from another person toileting, which includes using toliet, bedpan, or urinal? 2  Help from another person bathing (including washing, rinsing, drying)? 2  Help from another person to put on and taking off regular upper body clothing? 3  Help from another person to put on and taking off regular lower body clothing? 2  6 Click Score 16  Progressive Mobility  What is the highest level of mobility based on the progressive mobility assessment? Level 1 (Bedfast) - Unable to balance while sitting on edge of bed  Mobility Sit up in bed/chair position for meals  OT Goal Progression  Progress towards OT goals Progressing toward goals  Acute Rehab OT Goals  Patient Stated Goal avoid RLE amputation  OT Goal Formulation With patient  Time For Goal Achievement 06/20/21  Potential to Achieve Goals Good  OT Time Calculation  OT Start Time (ACUTE ONLY) 1455  OT Stop Time (ACUTE ONLY) 1510  OT Time Calculation (min) 15 min  OT General Charges  $OT Visit 1 Visit  OT Treatments  $Therapeutic Exercise  8-22 mins  Martie Round, OTR/L Acute Rehabilitation Services Pager: [phone] Office: [phone]

## 2021-06-15 NOTE — Progress Notes (Addendum)
   VASCULAR SURGERY ASSESSMENT & PLAN:   PAD; ischemic right foot POD 5 right CFA to AK popliteal artery bypass, right fourth and fifth toe amputation including metatarsal head. Patent bypass. Begin Dakins solution W>D BID POD 26 left AKA. Flaps well perfused.  SUBJECTIVE:   Occasional pain in right foot.  PHYSICAL EXAM:   Vitals:   06/14/21 1623 06/14/21 2043 06/14/21 2333 06/15/21 0339  BP: (!) 115/59 (!) 126/57 140/74 127/70  Pulse: 89 75 85 88  Resp: 20 20 18 18   Temp: 98.9 F (37.2 C) 98.9 F (37.2 C) 98.9 F (37.2 C) 98.9 F (37.2 C)  TempSrc: Oral Oral Oral Oral  SpO2: 100% 100% 98% 98%  Weight:    57.9 kg  Height:       General appearance: Awake, alert in no apparent distress Cardiac: Heart rate and rhythm are regular Respirations: Nonlabored Incisions: Right groin, thigh and lower leg incisions are all well approximated without bleeding or hematoma. Calf is soft. Extremities: Right foot amp site with approx 10% granulation tissue. Green drainage on gauze. Left AKA incision healing. Pulse/Doppler exam:  Brisk right dorsalis pedis, posterior tibial artery Doppler signals      LABS:   Lab Results  Component Value Date   WBC 10.0 06/11/2021   HGB 9.4 (L) 06/11/2021   HCT 28.4 (L) 06/11/2021   MCV 86.1 06/11/2021   PLT 410 (H) 06/11/2021   Lab Results  Component Value Date   CREATININE 0.97 06/11/2021   No results found for: INR, PROTIME CBG (last 3)  Recent Labs    06/14/21 1621 06/14/21 2043 06/15/21 0627  GLUCAP 138* 160* 133*    PROBLEM LIST:    Active Problems:   Femoral artery occlusion (HCC)   Peripheral artery disease (HCC)   PVD (peripheral vascular disease) (HCC)   Pressure injury of skin   CURRENT MEDS:    amLODipine  10 mg Oral Daily   aspirin EC  81 mg Oral Daily   atenolol  25 mg Oral BID   atorvastatin  40 mg Oral QHS   docusate sodium  100 mg Oral Daily   ferrous sulfate  325 mg Oral Q breakfast   folic acid  1 mg Oral  Daily   gabapentin  100 mg Oral BID   heparin  5,000 Units Subcutaneous Q8H   insulin aspart  0-15 Units Subcutaneous TID WC   metFORMIN  500 mg Oral Q breakfast   multivitamin with minerals  1 tablet Oral Daily   oxybutynin  2.5 mg Oral BID   pantoprazole  40 mg Oral Daily    08/15/21, PA-C  Office: [phone] 06/15/2021   With the above.  I have seen and evaluated the patient.  I plan on continuing to let the right leg demarcate.  I did discuss the possibility of a more proximal amputation.  Patient understands this.  I will try to reach out to his wife later today to update her.  08/15/2021

## 2021-06-16 LAB — GLUCOSE, CAPILLARY
Glucose-Capillary: 122 mg/dL — ABNORMAL HIGH (ref 70–99)
Glucose-Capillary: 190 mg/dL — ABNORMAL HIGH (ref 70–99)
Glucose-Capillary: 106 mg/dL — ABNORMAL HIGH (ref 70–99)
Glucose-Capillary: 131 mg/dL — ABNORMAL HIGH (ref 70–99)

## 2021-06-16 NOTE — Progress Notes (Signed)
Mobility Specialist: Progress Note   06/16/21 1147  Mobility  Activity  (Bed Exercises)  Level of Assistance Independent  Assistive Device None  Mobility Sit up in bed/chair position for meals  Mobility Response Tolerated fair  Mobility performed by Mobility specialist  $Mobility charge 1 Mobility   Pre-Mobility: 98 HR, 117/72 BP, 98% SpO2 Post-Mobility: 98 HR, 99% SpO2  Attempted for pt to scoot to the chair but pt declined stating he doesn't want to put any pressure on his R foot. Pt agreeable to EOB LE exercises again. Pt limited d/t pain in RLE he rated 6/10. Pt back in bed and set-up with his dinner. Bed alarm is on.   [facility] Jakiyah Stepney Mobility Specialist Mobility Specialist Phone: [phone]

## 2021-06-16 NOTE — Progress Notes (Addendum)
  Progress Note    06/16/2021 7:31 AM 6 Days Post-Op  Subjective:  sleeping; wakes easily and has no complaints.  Says he is mobilizing ok with assistance.   Afebrile HR 80's-100's NSR 100's-120's systolic 98% RA  Vitals:   06/15/21 2326 06/16/21 0330  BP: 124/71 101/64  Pulse: 80 75  Resp: 20 20  Temp: 98.9 F (37.2 C) 98 F (36.7 C)  SpO2: 100% 98%    Physical Exam: Cardiac:  regular  Lungs:  non labored Incisions:  right leg incisions are clean and dry; right foot bandage in tact.  Darkness of remaining toes on the tips.  Mild odor.  Left AKA incision healing nicely Extremities:  +PT doppler signal right   CBC    Component Value Date/Time   WBC 10.0 06/11/2021 0212   RBC 2.44 (L) 06/11/2021 0212   HGB 9.4 (L) 06/11/2021 1925   HCT 28.4 (L) 06/11/2021 1925   PLT 410 (H) 06/11/2021 0212   MCV 86.1 06/11/2021 0212   MCH 26.6 06/11/2021 0212   MCHC 31.0 06/11/2021 0212   RDW 17.0 (H) 06/11/2021 0212   LYMPHSABS 1.2 05/26/2021 0443   MONOABS 0.4 05/26/2021 0443   EOSABS 0.1 05/26/2021 0443   BASOSABS 0.0 05/26/2021 0443    BMET    Component Value Date/Time   NA 129 (L) 06/11/2021 0212   K 4.9 06/11/2021 0212   CL 97 (L) 06/11/2021 0212   CO2 21 (L) 06/11/2021 0212   GLUCOSE 291 (H) 06/11/2021 0212   BUN 22 06/11/2021 0212   CREATININE 0.97 06/11/2021 0212   CALCIUM 9.2 06/11/2021 0212   GFRNONAA >60 06/11/2021 0212    INR No results found for: INR   Intake/Output Summary (Last 24 hours) at 06/16/2021 0731 Last data filed at 06/16/2021 0331 Gross per 24 hour  Intake 1920 ml  Output 1300 ml  Net 620 ml     Assessment/Plan:  75 y.o. 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 Patent bypass. Begin Dakins solution W>D BID POD 27 left AKA. Flaps well perfused.     -pt with PT doppler signal on the right.  Other incisions are healing nicely.  Right groin with hematoma  but does not appear acute.   -will come back later to change foot dressing.  There is a mild odor to the right foot.  The remaining toes are dark on the tips.  Will need time to demarcate.  Pt still at risk for amputation.  Discussed with pt he is not out of the woods yet.  He expressed understanding and thankful for our help with his leg. -DVT prophylaxis:  sq heparin   Doreatha Massed, PA-C [facility] [phone] 06/16/2021 7:31 AM

## 2021-06-16 NOTE — Progress Notes (Signed)
Damp to dry dressing with Dakins solution change to right foot performed.

## 2021-06-17 LAB — GLUCOSE, CAPILLARY
Glucose-Capillary: 115 mg/dL — ABNORMAL HIGH (ref 70–99)
Glucose-Capillary: 225 mg/dL — ABNORMAL HIGH (ref 70–99)
Glucose-Capillary: 107 mg/dL — ABNORMAL HIGH (ref 70–99)
Glucose-Capillary: 142 mg/dL — ABNORMAL HIGH (ref 70–99)

## 2021-06-17 NOTE — Progress Notes (Signed)
Removed 21 staples from left AKA incision. Pt tolerated well. Will place some steri strips to mid incision area as pt pulling at stump. Pt resting with call bell within reach.  Will continue to monitor.

## 2021-06-17 NOTE — Progress Notes (Signed)
Physical Therapy Treatment Patient Details Name: Todd Duran MRN: [id number] DOB: 06-12-1946 Today's Date: 06/17/2021    History of Present Illness Pt is a 75 yo male admitted on 7/29 from CIR due to R lower limb ischemia. Pt underwent R femoral endarterectomy, R iliac & superficial fem art stent, R 5th toe amp. 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 Increased RLE pain post-op. PMH: L AKA (05/13/21), DM, HTN    PT Comments    Pt received in supine, agreeable to therapy session and with good participation in wheelchair transfer and mobility training. Pt performed anterior/posterior transfer with minA at most (+2 for safety/equipment) and min guard for bed mobility tasks. Pt continues to report severe RLE pain. Pt Supervision with cues for wheelchair mobility in hallway. Pt continues to benefit from PT services to progress toward functional mobility goals.    Follow Up Recommendations  CIR     Equipment Recommendations  Wheelchair (measurements PT);Wheelchair cushion (measurements PT);Other (comment) (removable leg rests, slide board)    Recommendations for Other Services       Precautions / Restrictions Precautions Precautions: Fall Precaution Comments: L aka, R 5th amp Required Braces or Orthoses: Other Brace Other Brace: R darco shoe Restrictions Weight Bearing Restrictions: Yes RLE Weight Bearing:  (kept NWB for comfort although no formal order in place) LLE Weight Bearing: Non weight bearing Other Position/Activity Restrictions: no formal orders for RLE in chart though darco shoe noted in room. Assumed WB through heel (RN 8/5 recommended RLE NWB due to high risk for BKA/AKA.)    Mobility  Bed Mobility Overal bed mobility: Needs Assistance Bed Mobility: Supine to Sit     Supine to sit: Min guard;HOB elevated Sit to supine: Min assist   General bed mobility comments: elevated supine posture to long sit with min guard then min  guard to minA to reposition in supine after anterior transfer back to bed    Transfers Overall transfer level: Needs assistance Equipment used:  (transfer pads) Transfers: Licensed conveyancer transfers: Min assist;+2 safety/equipment;From elevated surface   General transfer comment: posterior scoot into WC from EOB with +1 minA to support RLE into elevated posture to reduce pain/prevent shearing; second staff member behind pt to stabilize WC for safety but not needing to physically assist; same sequence to return  Ambulation/Gait                 Psychologist, counselling mobility: Yes Wheelchair propulsion: Both upper extremities Wheelchair parts: Supervision/cueing Distance: 400 Wheelchair Assistance Details (indicate cue type and reason): vcs for use of brakes, pt propelled chair and performs turns 180 degrees with smooth technique, min cues for environmental awareness/safety  Modified Rankin (Stroke Patients Only)       Balance Overall balance assessment: Needs assistance Sitting-balance support: Feet unsupported;No upper extremity supported Sitting balance-Leahy Scale: Fair Sitting balance - Comments: does well with supervision for rotating body in bed pre/post scoot transfer                                    Cognition Arousal/Alertness: Awake/alert Behavior During Therapy: [facility] for tasks assessed/performed  Following Commands: Follows one step commands consistently       General Comments: pt aware he is at risk of losing his R LE, following commands well for posterior/anterior scoot transfers but does need repetition of cues for new tasks he has not tried before      Exercises      General Comments General comments (skin integrity, edema, etc.): VSS on RA      Pertinent Vitals/Pain Pain Assessment: Faces Faces Pain  Scale: Hurts whole lot Pain Location: RLE with mobility Pain Descriptors / Indicators: Grimacing;Guarding;Discomfort;Sore Pain Intervention(s): Limited activity within patient's tolerance;Monitored during session;Repositioned;Premedicated before session (pain meds ~90 mins prior to mobility per spouse)    Home Living                      Prior Function            PT Goals (current goals can now be found in the care plan section) Acute Rehab PT Goals Patient Stated Goal: avoid RLE amputation PT Goal Formulation: With patient Time For Goal Achievement: 06/19/21 Progress towards PT goals: Progressing toward goals    Frequency    Min 3X/week      PT Plan Current plan remains appropriate    Co-evaluation              AM-PAC PT "6 Clicks" Mobility   Outcome Measure  Help needed turning from your back to your side while in a flat bed without using bedrails?: A Little Help needed moving from lying on your back to sitting on the side of a flat bed without using bedrails?: A Little Help needed moving to and from a bed to a chair (including a wheelchair)?: A Little Help needed standing up from a chair using your arms (e.g., wheelchair or bedside chair)?: Total Help needed to walk in hospital room?: Total Help needed climbing 3-5 steps with a railing? : Total 6 Click Score: 12    End of Session   Activity Tolerance: Patient tolerated treatment well Patient left: in bed;with call bell/phone within reach;with bed alarm set;with family/visitor present (spouse Todd Duran present) Nurse Communication: Mobility status PT Visit Diagnosis: Other abnormalities of gait and mobility (R26.89);Pain Pain - Right/Left: Right Pain - part of body: Leg     Time: 1730-1747 PT Time Calculation (min) (ACUTE ONLY): 17 min  Charges:  $Therapeutic Activity: 8-22 mins                     Yafet Cline P., PTA Acute Rehabilitation Services Pager: [phone] Office: [phone]    Angus Palms 06/17/2021, 6:49 PM

## 2021-06-17 NOTE — Progress Notes (Addendum)
Progress Note    06/17/2021 8:07 AM 7 Days Post-Op  Subjective:  says his back hurts, otherwise no complaints.  Tm 101.1 now afebrile HR 80's-90's  110's-120's systolic   Vitals:   06/16/21 9528 06/17/21 0324  BP: 122/73 109/62  Pulse: 84 84  Resp: 18 12  Temp: 98.8 F (37.1 C) 98.9 F (37.2 C)  SpO2: 99% 98%    Physical Exam: Cardiac:  regular Lungs:  non labored Incisions:  right groin with serous drainage from proximal portion of incision.  Otherwise other incisions have healed nicely.  Left AKA stump healed.  Extremities:  brisk right PT doppler signal.  Bandage dry.  Picture in notes from yesterday.   CBC    Component Value Date/Time   WBC 10.0 06/11/2021 0212   RBC 2.44 (L) 06/11/2021 0212   HGB 9.4 (L) 06/11/2021 1925   HCT 28.4 (L) 06/11/2021 1925   PLT 410 (H) 06/11/2021 0212   MCV 86.1 06/11/2021 0212   MCH 26.6 06/11/2021 0212   MCHC 31.0 06/11/2021 0212   RDW 17.0 (H) 06/11/2021 0212   LYMPHSABS 1.2 05/26/2021 0443   MONOABS 0.4 05/26/2021 0443   EOSABS 0.1 05/26/2021 0443   BASOSABS 0.0 05/26/2021 0443    BMET    Component Value Date/Time   NA 129 (L) 06/11/2021 0212   K 4.9 06/11/2021 0212   CL 97 (L) 06/11/2021 0212   CO2 21 (L) 06/11/2021 0212   GLUCOSE 291 (H) 06/11/2021 0212   BUN 22 06/11/2021 0212   CREATININE 0.97 06/11/2021 0212   CALCIUM 9.2 06/11/2021 0212   GFRNONAA >60 06/11/2021 0212    INR No results found for: INR   Intake/Output Summary (Last 24 hours) at 06/17/2021 4132 Last data filed at 06/17/2021 4401 Gross per 24 hour  Intake 720 ml  Output 550 ml  Net 170 ml     Assessment/Plan:  75 y.o. 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 Patent bypass. Begin Dakins solution W>D BID POD 28 left AKA. Flaps well perfused.  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 Patent  bypass. Begin Dakins solution W>D BID 7 Days Post-Op   -pt's remaining toes unchanged and dark on the tips.  He does have a brisk right PT doppler signal.  Right foot with minimal granulation tissue.  At risk for more proximal amputation.  Continue dressing changes -pt with fever of 101.1 yesterday afternoon.  He does have some serous drainage from the right groin incision proximally but does not look infectious.   Dr. Myra Gianotti to evaluate this morning.  Will discuss if he feels he needs antibiotics.   -DVT prophylaxis:  sq heparin  -left AKA stump well healed-will get staples out today    Doreatha Massed, PA-C [facility] [phone] 06/17/2021 8:07 AM  I agree with the above.  I have seen and evaluated patient.  I had extensive conversation with the patient and his wife who is at the bedside.  I performed dressing change of his foot.  There is a large amount of necrotic tissue however the central portion of the wound does have pink granulation tissue.  The dark areas on the top of this foot have blistered but do have some granulation tissue underneath.  I discussed with them that I believe he is at high risk for requiring a below-knee amputation, however I would  like to give him some more time and let his foot demarcate.  No matter what he is going to need further debridement of the foot, however I would like to avoid this unless I feel that it is salvageable.  I am switching him to Dakin's dressing changes as there was some greenish type drainage from the foot.  Hopefully we can make a decision in the immediate future regarding definitive care for his leg.  [name]

## 2021-06-17 NOTE — Progress Notes (Signed)
Mobility Specialist: Progress Note   06/17/21 1748  Mobility  Activity  (Wheelchair mobility)  Level of Assistance Minimal assist, patient does 75% or more  Assistive Device Wheelchair  Distance Ambulated (ft) 230 ft  Mobility Response Tolerated well  Mobility performed by Mobility specialist;Other (comment) (PTA Carly)  $Mobility charge 1 Mobility   Pt required minA for bed mobility as well as to scoot into the wheelchair posteriorly. Pt independent during wheelchair mobility. Pt c/o 8/10 pain when back in the bed. Pt back to bed with call bell and phone at his side and pt's wife present in the room.   [facility] Calin Ellery Mobility Specialist Mobility Specialist Phone: [phone]

## 2021-06-18 LAB — GLUCOSE, CAPILLARY
Glucose-Capillary: 116 mg/dL — ABNORMAL HIGH (ref 70–99)
Glucose-Capillary: 142 mg/dL — ABNORMAL HIGH (ref 70–99)
Glucose-Capillary: 124 mg/dL — ABNORMAL HIGH (ref 70–99)
Glucose-Capillary: 111 mg/dL — ABNORMAL HIGH (ref 70–99)

## 2021-06-18 NOTE — Progress Notes (Signed)
Occupational Therapy Treatment Patient Details Name: Todd Duran MRN: [id number] DOB: 1945/11/16 Today's Date: 06/18/2021    History of present illness Pt is a 75 yo male admitted on 7/29 from CIR due to R lower limb ischemia. Pt underwent R femoral endarterectomy, R iliac & superficial fem art stent, R 5th toe amp. 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 Increased RLE pain post-op. PMH: L AKA (05/13/21), DM, HTN   OT comments  On entry, pt preparing for transfer back to bed after lunch. OT assisted pt with setup of recliner at bedside with pt able to demo scoot transfer with Supervision only. Once back in bed, pt declined further activities and reported already having completed UE HEP today. Will continue to follow acutely and progress LB ADLs with compensatory strategies. DC recs remain appropriate.    Follow Up Recommendations  CIR    Equipment Recommendations  Tub/shower bench;Wheelchair (measurements OT);Wheelchair cushion (measurements OT) (drop arm BSC)    Recommendations for Other Services Rehab consult    Precautions / Restrictions Precautions Precautions: Fall Precaution Comments: L aka, R 5th amp Required Braces or Orthoses: Other Brace Other Brace: R darco shoe Restrictions Weight Bearing Restrictions: Yes LLE Weight Bearing: Non weight bearing Other Position/Activity Restrictions: no formal orders for RLE in chart though darco shoe noted in room. Assumed WB through heel (RN 8/5 recommended RLE NWB due to high risk for BKA/AKA.)       Mobility Bed Mobility Overal bed mobility: Modified Independent Bed Mobility: Sit to Supine       Sit to supine: Modified independent (Device/Increase time)        Transfers Overall transfer level: Needs assistance Equipment used: None Transfers: Lateral/Scoot Transfers          Lateral/Scoot Transfers: Supervision General transfer comment: Supervision for scooting from chair  to bed with no physical assist needed, no cues for sequencing. did need cues to allow OT to lock brakes prior to initiating transfers    Balance Overall balance assessment: Needs assistance Sitting-balance support: Feet unsupported;No upper extremity supported Sitting balance-Leahy Scale: Good                                     ADL either performed or assessed with clinical judgement   ADL Overall ADL's : Needs assistance/impaired                                       General ADL Comments: Discussed BSC transfers with pt/RN with pt reporting these going well. On entry, pt wanting to return to bed and had been up awhile. reported completing UE exercises earlier (would need to confirm) and declined other activtiies at this time     Vision   Vision Assessment?: No apparent visual deficits   Perception     Praxis      Cognition Arousal/Alertness: Awake/alert Behavior During Therapy: WFL for tasks assessed/performed Overall Cognitive Status: Impaired/Different from baseline Area of Impairment: Awareness;Problem solving;Memory                     Memory: Decreased short-term memory Following Commands: Follows one step commands consistently Safety/Judgement: Decreased awareness of safety;Decreased awareness of deficits Awareness: Emergent Problem Solving: Slow processing;Requires verbal cues General Comments: pt aware he is at  risk of losing his R LE, following commands well and able to assist in directing care        Exercises     Shoulder Instructions       General Comments      Pertinent Vitals/ Pain       Pain Assessment: Faces Faces Pain Scale: No hurt Pain Intervention(s): Premedicated before session;Monitored during session  Home Living                                          Prior Functioning/Environment              Frequency  Min 2X/week        Progress Toward Goals  OT Goals(current  goals can now be found in the care plan section)  Progress towards OT goals: Progressing toward goals  Acute Rehab OT Goals Patient Stated Goal: avoid RLE amputation OT Goal Formulation: With patient Time For Goal Achievement: 06/20/21 Potential to Achieve Goals: Good ADL Goals Pt Will Perform Grooming: with modified independence;sitting Pt Will Perform Lower Body Bathing: with modified independence;sitting/lateral leans;with adaptive equipment Pt Will Perform Lower Body Dressing: with set-up;with adaptive equipment;sitting/lateral leans Pt Will Transfer to Toilet: with supervision;bedside commode Pt Will Perform Toileting - Clothing Manipulation and hygiene: with modified independence;sitting/lateral leans Pt Will Perform Tub/Shower Transfer: Tub transfer;with supervision;tub bench Pt/caregiver will Perform Home Exercise Program: Increased strength;Both right and left upper extremity;With theraband;Independently Additional ADL Goal #1: Pt to verbalize at least 2 fall prevention strategies to maximize safety during ADLs/transfers  Plan Discharge plan remains appropriate    Co-evaluation                 AM-PAC OT "6 Clicks" Daily Activity     Outcome Measure   Help from another person eating meals?: None Help from another person taking care of personal grooming?: A Little Help from another person toileting, which includes using toliet, bedpan, or urinal?: A Lot Help from another person bathing (including washing, rinsing, drying)?: A Lot Help from another person to put on and taking off regular upper body clothing?: A Little Help from another person to put on and taking off regular lower body clothing?: A Little 6 Click Score: 17    End of Session    OT Visit Diagnosis: Unsteadiness on feet (R26.81);Other abnormalities of gait and mobility (R26.89);Muscle weakness (generalized) (M62.81);Pain;Other symptoms and signs involving cognitive function Pain - Right/Left:  Right Pain - part of body: Leg   Activity Tolerance Patient tolerated treatment well   Patient Left in bed;with call bell/phone within reach;with bed alarm set;with family/visitor present   Nurse Communication          Time: 9924-2683 OT Time Calculation (min): 10 min  Charges: OT General Charges $OT Visit: 1 Visit OT Treatments $Therapeutic Activity: 8-22 mins  Bradd Canary, OTR/L Acute Rehab Services Office: [phone]    Lorre Munroe 06/18/2021, 2:18 PM

## 2021-06-18 NOTE — Progress Notes (Addendum)
  Progress Note    06/18/2021 9:53 AM 8 Days Post-Op  Subjective:  no complaints. Eating breakfast   Vitals:   06/18/21 0322 06/18/21 0730  BP: 122/74 114/71  Pulse: 89 95  Resp: 14 15  Temp: 98.9 F (37.2 C) 98.5 F (36.9 C)  SpO2: 98% 98%   Physical Exam: Cardiac:  regular Lungs:  non labored Incisions:  L AKA looks good. Staples removed yesterday. Right groin with mild serous drainage. Otherwise incisions are well appearing. Healing well  Dakins solution change to right foot. Unchanged from yesterday. Lots of necrotic tissue with small area of pink granulation tissue in the middle. Ischemic changes to 1st and 2nd toes. Malodorous Extremities:  Brisk Right PT doppler signal Neurologic: alert and oriented  CBC    Component Value Date/Time   WBC 10.0 06/11/2021 0212   RBC 2.44 (L) 06/11/2021 0212   HGB 9.4 (L) 06/11/2021 1925   HCT 28.4 (L) 06/11/2021 1925   PLT 410 (H) 06/11/2021 0212   MCV 86.1 06/11/2021 0212   MCH 26.6 06/11/2021 0212   MCHC 31.0 06/11/2021 0212   RDW 17.0 (H) 06/11/2021 0212   LYMPHSABS 1.2 05/26/2021 0443   MONOABS 0.4 05/26/2021 0443   EOSABS 0.1 05/26/2021 0443   BASOSABS 0.0 05/26/2021 0443    BMET    Component Value Date/Time   NA 129 (L) 06/11/2021 0212   K 4.9 06/11/2021 0212   CL 97 (L) 06/11/2021 0212   CO2 21 (L) 06/11/2021 0212   GLUCOSE 291 (H) 06/11/2021 0212   BUN 22 06/11/2021 0212   CREATININE 0.97 06/11/2021 0212   CALCIUM 9.2 06/11/2021 0212   GFRNONAA >60 06/11/2021 0212    INR No results found for: INR   Intake/Output Summary (Last 24 hours) at 06/18/2021 0953 Last data filed at 06/18/2021 0800 Gross per 24 hour  Intake 360 ml  Output 725 ml  Net -365 ml     Assessment/Plan:  75 y.o. male is s/p right CF to AK popliteal artery bypass and 4th and 5th toe amputations 8 Days Post-Op . L AKA healing well. Staples removed yesterday  RLE adequately perfused with brisk PT signal. Right toe amputation sites  unchanged. A lot of necrotic tissue in wound bed. Malodorous during dressing change. Dakins wet to dry dressings applied. Remains at high risk for more proximal amputation. Continue BID dressing changes   Graceann Congress, PA-C [facility] [phone] 06/18/2021 9:53 AM

## 2021-06-18 NOTE — Progress Notes (Signed)
Mobility Specialist: Progress Note   06/18/21 1119  Mobility  Activity  (Wheelchair mobility)  Level of Assistance Minimal assist, patient does 75% or more  Assistive Device Wheelchair  Distance Ambulated (ft) 460 ft  Mobility Response Tolerated well  Mobility performed by Mobility specialist  $Mobility charge 1 Mobility   Pre-Mobility: 91 HR Post-Mobility: 89 HR  Pt required minA for bed mobility as well as to transfer into the wheelchair. Pt asx throughout. Pt back to bed after wheelchair mobility with call bell and phone at his side.   [facility] Lilyanna Lunt Mobility Specialist Mobility Specialist Phone: [phone]

## 2021-06-19 LAB — GLUCOSE, CAPILLARY
Glucose-Capillary: 112 mg/dL — ABNORMAL HIGH (ref 70–99)
Glucose-Capillary: 167 mg/dL — ABNORMAL HIGH (ref 70–99)
Glucose-Capillary: 111 mg/dL — ABNORMAL HIGH (ref 70–99)
Glucose-Capillary: 100 mg/dL — ABNORMAL HIGH (ref 70–99)

## 2021-06-19 NOTE — Progress Notes (Addendum)
  Progress Note    06/19/2021 8:00 AM 9 Days Post-Op  Subjective:  no major complaints. Asking for orange juice and chocolate ice cream   Vitals:   06/18/21 2205 06/19/21 0042  BP: 115/65 110/70  Pulse: 80 80  Resp: 16 18  Temp: 99.1 F (37.3 C) 98.7 F (37.1 C)  SpO2: 99% 96%   Physical Exam: Cardiac:  regular Lungs:  non labored Incisions:  right groin proximal incision with clear drainage on compression. No bleeding. Clean dressings reapplied. Right foot dressings c/d/I. Mallodorous Extremities: 2+ femoral pulse, palpable pulse in bypass, doppler PT brisk   Neurologic: alert and oriented  CBC    Component Value Date/Time   WBC 10.0 06/11/2021 0212   RBC 2.44 (L) 06/11/2021 0212   HGB 9.4 (L) 06/11/2021 1925   HCT 28.4 (L) 06/11/2021 1925   PLT 410 (H) 06/11/2021 0212   MCV 86.1 06/11/2021 0212   MCH 26.6 06/11/2021 0212   MCHC 31.0 06/11/2021 0212   RDW 17.0 (H) 06/11/2021 0212   LYMPHSABS 1.2 05/26/2021 0443   MONOABS 0.4 05/26/2021 0443   EOSABS 0.1 05/26/2021 0443   BASOSABS 0.0 05/26/2021 0443    BMET    Component Value Date/Time   NA 129 (L) 06/11/2021 0212   K 4.9 06/11/2021 0212   CL 97 (L) 06/11/2021 0212   CO2 21 (L) 06/11/2021 0212   GLUCOSE 291 (H) 06/11/2021 0212   BUN 22 06/11/2021 0212   CREATININE 0.97 06/11/2021 0212   CALCIUM 9.2 06/11/2021 0212   GFRNONAA >60 06/11/2021 0212    INR No results found for: INR   Intake/Output Summary (Last 24 hours) at 06/19/2021 0800 Last data filed at 06/19/2021 0636 Gross per 24 hour  Intake 120 ml  Output 750 ml  Net -630 ml     Assessment/Plan:  75 y.o. male is s/p right CF to AK popliteal artery bypass and 4th and 5th toe amputations 8 Days Post-Op . L AKA healing well. Staples removed yesterday 9 Days Post-Op   RLE bypass patent with brisk PT doppler signal Foot dressings will be changed later this morning. Continue Dakins wet to dry. Malodorous right foot with wound with a lot of  necrotic tissue Dr. Myra Gianotti to see patient later today. Likely recommendation will be BKA vs. AKA Dry dressings to right groin  DVT prophylaxis:  sq heparin   Graceann Congress, PA-C [facility] [phone] 06/19/2021 8:00 AM  I agree with the above.  I have seen and evaluated the patient.  I will change his dressings tomorrow make a decision regarding future surgery.  [name]

## 2021-06-19 NOTE — Progress Notes (Signed)
Physical Therapy Treatment Patient Details Name: Todd Duran MRN: [id number] DOB: [birth year] Today's Date: 06/19/2021    History of Present Illness Pt is a 75 yo male admitted on 7/29 from CIR due to R lower limb ischemia. Pt underwent R femoral endarterectomy, R iliac & superficial fem art stent, R 5th toe amp. 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 Increased RLE pain post-op. 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 Continues to be motivated to maximize independence.   Follow Up Recommendations  CIR     Equipment Recommendations  Wheelchair (measurements PT);Wheelchair cushion (measurements PT);Other (comment)    Recommendations for Other Services       Precautions / Restrictions Precautions Precautions: Fall Required Braces or Orthoses: Other Brace Other Brace: R darco shoe Restrictions Other Position/Activity Restrictions: no formal orders for RLE in chart though darco shoe noted in room. Assumed WB through heel (RN 8/5 recommended RLE NWB due to high risk for BKA/AKA.)    Mobility  Bed Mobility Overal bed mobility: Modified Independent Bed Mobility: Supine to Sit                Transfers Overall transfer level: Needs assistance Equipment used: None Transfers: Lateral/Scoot Transfers Sit to Stand: Min assist         General transfer comment: Assist to support RLE as pt performed scooting transfer  Ambulation/Gait                 Stairs             Wheelchair Mobility    Modified Rankin (Stroke Patients Only)       Balance Overall balance assessment: Needs assistance Sitting-balance support: Feet unsupported;No upper extremity supported Sitting balance-Leahy Scale: Good                                      Cognition Arousal/Alertness: Awake/alert Behavior During Therapy: WFL for tasks  assessed/performed Overall Cognitive Status: Impaired/Different from baseline                       Memory: Decreased short-term memory                Exercises      General Comments        Pertinent Vitals/Pain Pain Assessment: Faces Faces Pain Scale: Hurts even more Pain Location: RLE with mobility Pain Descriptors / Indicators: Grimacing;Guarding;Discomfort;Sore Pain Intervention(s): Limited activity within patient's tolerance;Repositioned;Monitored during session    Home Living                      Prior Function            PT Goals (current goals can now be found in the care plan section) Progress towards PT goals: Progressing toward goals    Frequency    Min 3X/week      PT Plan Current plan remains appropriate    Co-evaluation              AM-PAC PT "6 Clicks" Mobility   Outcome Measure  Help needed turning from your back to your side while in a flat bed without using bedrails?: None Help needed moving from lying on your back to sitting on the side of a flat  bed without using bedrails?: A Little Help needed moving to and from a bed to a chair (including a wheelchair)?: A Little Help needed standing up from a chair using your arms (e.g., wheelchair or bedside chair)?: Total Help needed to walk in hospital room?: Total Help needed climbing 3-5 steps with a railing? : Total 6 Click Score: 13    End of Session   Activity Tolerance: Patient tolerated treatment well Patient left: in chair;with call bell/phone within reach;with chair alarm set   PT Visit Diagnosis: Other abnormalities of gait and mobility (R26.89);Pain Pain - Right/Left: Right Pain - part of body: Leg     Time: 1215-1228 PT Time Calculation (min) (ACUTE ONLY): 13 min  Charges:  $Therapeutic Activity: 8-22 mins                     [facility] PT Acute Rehabilitation Services Pager [phone] Office [phone]    Angelina Ok [facility] 06/19/2021,  5:23 PM

## 2021-06-19 NOTE — Progress Notes (Signed)
Dressing change to right foot done with DAkins solution as ordered. Beefy red areas in middle of wound, outer edges with dark tissue, inside edges with yellow in appearance tissue. Malodorous. Todd Duran, Randall An RN

## 2021-06-20 LAB — CBC
HCT: 26.3 % — ABNORMAL LOW (ref 39.0–52.0)
Hemoglobin: 8.2 g/dL — ABNORMAL LOW (ref 13.0–17.0)
MCH: 26.5 pg (ref 26.0–34.0)
MCHC: 31.2 g/dL (ref 30.0–36.0)
MCV: 84.8 fL (ref 80.0–100.0)
Platelets: 567 10*3/uL — ABNORMAL HIGH (ref 150–400)
RBC: 3.1 MIL/uL — ABNORMAL LOW (ref 4.22–5.81)
RDW: 16.9 % — ABNORMAL HIGH (ref 11.5–15.5)
WBC: 11.9 10*3/uL — ABNORMAL HIGH (ref 4.0–10.5)
nRBC: 0 % (ref 0.0–0.2)

## 2021-06-20 LAB — BASIC METABOLIC PANEL
Anion gap: 9 (ref 5–15)
BUN: 19 mg/dL (ref 8–23)
CO2: 22 mmol/L (ref 22–32)
Calcium: 9.5 mg/dL (ref 8.9–10.3)
Chloride: 98 mmol/L (ref 98–111)
Creatinine, Ser: 0.88 mg/dL (ref 0.61–1.24)
GFR, Estimated: >60 mL/min (ref >60)
Glucose, Bld: 103 mg/dL — ABNORMAL HIGH (ref 70–99)
Potassium: 4.6 mmol/L (ref 3.5–5.1)
Sodium: 129 mmol/L — ABNORMAL LOW (ref 135–145)

## 2021-06-20 LAB — GLUCOSE, CAPILLARY
Glucose-Capillary: 96 mg/dL (ref 70–99)
Glucose-Capillary: 142 mg/dL — ABNORMAL HIGH (ref 70–99)
Glucose-Capillary: 118 mg/dL — ABNORMAL HIGH (ref 70–99)
Glucose-Capillary: 134 mg/dL — ABNORMAL HIGH (ref 70–99)

## 2021-06-20 NOTE — H&P (View-Only) (Signed)
I changed his dressing today.  His foot wound has made minimal progress.  I discussed with him I think the best option is to proceed with a right below-knee amputation.  His wife is supposed to come today and we will discuss this further.  I discussed potentially doing this tomorrow.  His groin remains unchanged.  Todd Duran 

## 2021-06-20 NOTE — Progress Notes (Signed)
I changed his dressing today.  His foot wound has made minimal progress.  I discussed with him I think the best option is to proceed with a right below-knee amputation.  His wife is supposed to come today and we will discuss this further.  I discussed potentially doing this tomorrow.  His groin remains unchanged.  Todd Duran

## 2021-06-21 ENCOUNTER — Inpatient Hospital Stay (HOSPITAL_COMMUNITY): Payer: No Typology Code available for payment source | Admitting: Certified Registered Nurse Anesthetist

## 2021-06-21 ENCOUNTER — Encounter (HOSPITAL_COMMUNITY): Payer: Self-pay | Admitting: Vascular Surgery

## 2021-06-21 ENCOUNTER — Encounter (HOSPITAL_COMMUNITY)
Admission: RE | Disposition: A | Discharge status: Home / Self Care | Payer: Self-pay | Source: Intra-hospital | Attending: Surgery

## 2021-06-21 DIAGNOSIS — M62271 Nontraumatic ischemic infarction of muscle, right ankle and foot: Secondary | ICD-10-CM

## 2021-06-21 HISTORY — PX: AMPUTATION: SHX166

## 2021-06-21 LAB — GLUCOSE, CAPILLARY
Glucose-Capillary: 110 mg/dL — ABNORMAL HIGH (ref 70–99)
Glucose-Capillary: 121 mg/dL — ABNORMAL HIGH (ref 70–99)
Glucose-Capillary: 146 mg/dL — ABNORMAL HIGH (ref 70–99)
Glucose-Capillary: 284 mg/dL — ABNORMAL HIGH (ref 70–99)
Glucose-Capillary: 166 mg/dL — ABNORMAL HIGH (ref 70–99)

## 2021-06-21 SURGERY — AMPUTATION BELOW KNEE
Anesthesia: Monitor Anesthesia Care | Site: Knee | Laterality: Right

## 2021-06-21 MED ORDER — FENTANYL CITRATE (PF) 250 MCG/5ML IJ SOLN
INTRAMUSCULAR | Status: DC | PRN
  Administered 2021-06-21 (×3): 25 ug via INTRAVENOUS
  Administered 2021-06-21: 50 ug via INTRAVENOUS
  Administered 2021-06-21 (×3): 25 ug via INTRAVENOUS
  Administered 2021-06-21: 50 ug via INTRAVENOUS

## 2021-06-21 MED ORDER — 0.9 % SODIUM CHLORIDE (POUR BTL) OPTIME
TOPICAL | Status: DC | PRN
  Administered 2021-06-21: 2000 mL

## 2021-06-21 MED ORDER — PROPOFOL 10 MG/ML IV BOLUS
INTRAVENOUS | Status: DC | PRN
  Administered 2021-06-21: 100 mg via INTRAVENOUS

## 2021-06-21 MED ORDER — PROPOFOL 10 MG/ML IV BOLUS
INTRAVENOUS | Status: AC
  Filled 2021-06-21: qty 20

## 2021-06-21 MED ORDER — FENTANYL CITRATE (PF) 250 MCG/5ML IJ SOLN
INTRAMUSCULAR | Status: AC
  Filled 2021-06-21: qty 5

## 2021-06-21 MED ORDER — ORAL CARE MOUTH RINSE: 15.0000 mL | Freq: Once | OROMUCOSAL | Status: AC

## 2021-06-21 MED ORDER — OXYCODONE HCL 5 MG PO TABS: 5.0000 mg | ORAL_TABLET | Freq: Once | ORAL | Status: DC | PRN

## 2021-06-21 MED ORDER — ACETAMINOPHEN 10 MG/ML IV SOLN: 1000.0000 mg | Freq: Once | INTRAVENOUS | Status: DC | PRN

## 2021-06-21 MED ORDER — MIDAZOLAM HCL 2 MG/2ML IJ SOLN
INTRAMUSCULAR | Status: AC
  Filled 2021-06-21: qty 2

## 2021-06-21 MED ORDER — CEFAZOLIN SODIUM-DEXTROSE 2-3 GM-%(50ML) IV SOLR
INTRAVENOUS | Status: DC | PRN
  Administered 2021-06-21: 2 g via INTRAVENOUS

## 2021-06-21 MED ORDER — MIDAZOLAM HCL 2 MG/2ML IJ SOLN
INTRAMUSCULAR | Status: DC | PRN
  Administered 2021-06-21: 1 mg via INTRAVENOUS

## 2021-06-21 MED ORDER — HYDROMORPHONE HCL 1 MG/ML IJ SOLN
INTRAMUSCULAR | Status: DC | PRN
  Administered 2021-06-21: .5 mg via INTRAVENOUS

## 2021-06-21 MED ORDER — BUPIVACAINE LIPOSOME 1.3 % IJ SUSP
INTRAMUSCULAR | Status: DC | PRN
  Administered 2021-06-21: 78 mL

## 2021-06-21 MED ORDER — ACETAMINOPHEN 160 MG/5ML PO SOLN: 1000.0000 mg | Freq: Once | ORAL | Status: DC | PRN

## 2021-06-21 MED ORDER — ACETAMINOPHEN 10 MG/ML IV SOLN
INTRAVENOUS | Status: AC
  Filled 2021-06-21: qty 100

## 2021-06-21 MED ORDER — FENTANYL CITRATE (PF) 100 MCG/2ML IJ SOLN: 25.0000 ug | INTRAMUSCULAR | Status: DC | PRN

## 2021-06-21 MED ORDER — HYDROMORPHONE HCL 1 MG/ML IJ SOLN
INTRAMUSCULAR | Status: AC
  Filled 2021-06-21: qty 0.5

## 2021-06-21 MED ORDER — ACETAMINOPHEN 500 MG PO TABS: 1000.0000 mg | ORAL_TABLET | Freq: Once | ORAL | Status: DC | PRN

## 2021-06-21 MED ORDER — PHENYLEPHRINE HCL-NACL 20-0.9 MG/250ML-% IV SOLN
INTRAVENOUS | Status: DC | PRN
  Administered 2021-06-21: 25 ug/min via INTRAVENOUS

## 2021-06-21 MED ORDER — ACETAMINOPHEN 10 MG/ML IV SOLN
INTRAVENOUS | Status: DC | PRN
  Administered 2021-06-21: 1000 mg via INTRAVENOUS

## 2021-06-21 MED ORDER — CHLORHEXIDINE GLUCONATE 0.12 % MT SOLN: 15.0000 mL | Freq: Once | OROMUCOSAL | Status: AC

## 2021-06-21 MED ORDER — BUPIVACAINE LIPOSOME 1.3 % IJ SUSP
INTRAMUSCULAR | Status: AC
  Filled 2021-06-21: qty 20

## 2021-06-21 MED ORDER — BUPIVACAINE HCL (PF) 0.5 % IJ SOLN
INTRAMUSCULAR | Status: AC
  Filled 2021-06-21: qty 30

## 2021-06-21 MED ORDER — LACTATED RINGERS IV SOLN: INTRAVENOUS | Status: DC

## 2021-06-21 MED ORDER — CHLORHEXIDINE GLUCONATE 0.12 % MT SOLN
OROMUCOSAL | Status: AC
  Administered 2021-06-21: 15 mL via OROMUCOSAL
  Filled 2021-06-21: qty 15

## 2021-06-21 MED ORDER — ONDANSETRON HCL 4 MG/2ML IJ SOLN
INTRAMUSCULAR | Status: DC | PRN
  Administered 2021-06-21: 4 mg via INTRAVENOUS

## 2021-06-21 MED ORDER — OXYCODONE HCL 5 MG/5ML PO SOLN: 5.0000 mg | Freq: Once | ORAL | Status: DC | PRN

## 2021-06-21 MED ORDER — DEXAMETHASONE SODIUM PHOSPHATE 10 MG/ML IJ SOLN
INTRAMUSCULAR | Status: DC | PRN
  Administered 2021-06-21: 10 mg via INTRAVENOUS

## 2021-06-21 SURGICAL SUPPLY — 60 items
BAG COUNTER SPONGE SURGICOUNT (BAG) ×2 IMPLANT
BAG SPNG CNTER NS LX DISP (BAG) ×1
BANDAGE ESMARK 6X9 LF (GAUZE/BANDAGES/DRESSINGS) IMPLANT
BLADE SAW GIGLI 510 (BLADE) ×2 IMPLANT
BNDG CMPR 9X6 STRL LF SNTH (GAUZE/BANDAGES/DRESSINGS)
BNDG COHESIVE 6X5 TAN STRL LF (GAUZE/BANDAGES/DRESSINGS) ×2 IMPLANT
BNDG ELASTIC 4X5.8 VLCR STR LF (GAUZE/BANDAGES/DRESSINGS) ×2 IMPLANT
BNDG ELASTIC 6X5.8 VLCR STR LF (GAUZE/BANDAGES/DRESSINGS) ×2 IMPLANT
BNDG ESMARK 6X9 LF (GAUZE/BANDAGES/DRESSINGS)
BNDG GAUZE ELAST 4 BULKY (GAUZE/BANDAGES/DRESSINGS) IMPLANT
CANISTER SUCT 3000ML PPV (MISCELLANEOUS) ×2 IMPLANT
CLIP VESOCCLUDE MED 6/CT (CLIP) IMPLANT
COVER SURGICAL LIGHT HANDLE (MISCELLANEOUS) ×2 IMPLANT
CUFF TOURN SGL QUICK 34 (TOURNIQUET CUFF)
CUFF TOURN SGL QUICK 42 (TOURNIQUET CUFF) IMPLANT
CUFF TRNQT CYL 34X4.125X (TOURNIQUET CUFF) IMPLANT
DRAIN CHANNEL 19F RND (DRAIN) IMPLANT
DRAPE HALF SHEET 40X57 (DRAPES) ×2 IMPLANT
DRAPE INCISE IOBAN 66X45 STRL (DRAPES) IMPLANT
DRAPE ORTHO SPLIT 77X108 STRL (DRAPES) ×4
DRAPE SURG ORHT 6 SPLT 77X108 (DRAPES) ×2 IMPLANT
DRESSING PREVENA PLUS CUSTOM (GAUZE/BANDAGES/DRESSINGS) IMPLANT
DRSG ADAPTIC 3X8 NADH LF (GAUZE/BANDAGES/DRESSINGS) ×2 IMPLANT
DRSG PREVENA PLUS CUSTOM (GAUZE/BANDAGES/DRESSINGS)
ELECT REM PT RETURN 9FT ADLT (ELECTROSURGICAL) ×2
ELECTRODE REM PT RTRN 9FT ADLT (ELECTROSURGICAL) ×1 IMPLANT
EVACUATOR SILICONE 100CC (DRAIN) IMPLANT
GAUZE 4X4 16PLY ~~LOC~~+RFID DBL (SPONGE) ×2 IMPLANT
GAUZE SPONGE 4X4 12PLY STRL (GAUZE/BANDAGES/DRESSINGS) ×2 IMPLANT
GLOVE SURG POLYISO LF SZ7.5 (GLOVE) ×2 IMPLANT
GLOVE SURG UNDER POLY LF SZ7.5 (GLOVE) ×2 IMPLANT
GOWN STRL REUS W/ TWL LRG LVL3 (GOWN DISPOSABLE) ×2 IMPLANT
GOWN STRL REUS W/ TWL XL LVL3 (GOWN DISPOSABLE) ×1 IMPLANT
GOWN STRL REUS W/TWL LRG LVL3 (GOWN DISPOSABLE) ×4
GOWN STRL REUS W/TWL XL LVL3 (GOWN DISPOSABLE) ×2
KIT BASIN OR (CUSTOM PROCEDURE TRAY) ×2 IMPLANT
KIT TURNOVER KIT B (KITS) ×2 IMPLANT
NDL SPNL 18GX3.5 QUINCKE PK (NEEDLE) IMPLANT
NEEDLE SPNL 18GX3.5 QUINCKE PK (NEEDLE) ×2 IMPLANT
NS IRRIG 1000ML POUR BTL (IV SOLUTION) ×3 IMPLANT
PACK GENERAL/GYN (CUSTOM PROCEDURE TRAY) ×2 IMPLANT
PAD ARMBOARD 7.5X6 YLW CONV (MISCELLANEOUS) ×4 IMPLANT
PREVENA RESTOR ARTHOFORM 46X30 (CANNISTER) IMPLANT
SPONGE T-LAP 18X18 ~~LOC~~+RFID (SPONGE) ×3 IMPLANT
STAPLER VISISTAT 35W (STAPLE) ×2 IMPLANT
STOCKINETTE IMPERVIOUS LG (DRAPES) ×2 IMPLANT
SUT BONE WAX W31G (SUTURE) IMPLANT
SUT ETHILON 3 0 PS 1 (SUTURE) IMPLANT
SUT SILK 0 TIES 10X30 (SUTURE) ×2 IMPLANT
SUT SILK 2 0 (SUTURE)
SUT SILK 2 0 SH CR/8 (SUTURE) ×2 IMPLANT
SUT SILK 2-0 18XBRD TIE 12 (SUTURE) IMPLANT
SUT SILK 3 0 (SUTURE)
SUT SILK 3-0 18XBRD TIE 12 (SUTURE) IMPLANT
SUT VIC AB 2-0 CT1 18 (SUTURE) ×5 IMPLANT
SYR 50ML LL SCALE MARK (SYRINGE) ×1 IMPLANT
TAPE UMBILICAL COTTON 1/8X30 (MISCELLANEOUS) ×1 IMPLANT
TOWEL GREEN STERILE (TOWEL DISPOSABLE) ×4 IMPLANT
UNDERPAD 30X36 HEAVY ABSORB (UNDERPADS AND DIAPERS) ×2 IMPLANT
WATER STERILE IRR 1000ML POUR (IV SOLUTION) ×2 IMPLANT

## 2021-06-21 NOTE — Op Note (Signed)
    Patient name: Todd Duran MRN: [id number] DOB: [birth year] Sex: male  06/21/2021 Pre-operative Diagnosis: Ischemic right foot Post-operative diagnosis:  Same Surgeon:  [name] Assistants:  Wendi Maya, PA Procedure:   Right below-knee amputation Anesthesia:  General Blood Loss:  200cc Specimens:  right leg  Findings: Healthy appearing muscle and good capillary bleeding at the amputation site.  Exparel was used for local anesthesia  Indications: This is a 75 year old gentleman who has previously undergone a left above-knee amputation.  He also had a right femoral-popliteal bypass graft as well as right femoral endarterectomy and iliac stenting.  Unfortunately his foot remains a significant with a foul odor and necrotic tissue.  We have waited about a week to see if his foot would improve with revascularization, however it has clearly demarcated.  We discussed proceeding with below-knee amputation  Procedure:  The patient was identified in the holding area and taken to [facility] OR ROOM 12  The patient was then placed supine on the table. general anesthesia was administered.  The patient was prepped and draped in the usual sterile fashion.  A time out was called and antibiotics were administered.  A PA was necessary to expedite the procedure and assist with technical details.  A two thirds one third posterior flap was created 12 cm distal to the tibial tuberosity.  Cautery was used divide the subcutaneous tissue and muscle.  I circumferentially exposed the tibia and fibula.  The tibia was transected with a Gigli saw and large bone cutters were used to transect the fibula proximal to the cut edge of the tibia.  Cautery was then used to divide the remaining muscle.  The neurovascular bundle was divided between hemostats.  The leg was removed as a specimen.  I then dissected out the nerve and ligated it proximal to the cut edge of the tibia with a 2-0 silk tie.  It was infiltrated with Exparel.   Similarly the artery and vein were ligated with silk ties.  The wound was then irrigated.  The bone edges were smoothed with a rasp.  There was good muscle bleeding and capillary bleeding at the amputation site.  The wound was irrigated.  Hemostasis was achieved.  The fascia was reapproximated with 2-0 Vicryl.  The skin was closed with staples.  Sterile dressings were applied followed by a stump shrinker and a limb guard.  There were no immediate complications.  Disposition: To PACU stable.   [name], M.D., [facility] [place] Office: [phone] Pager:  [phone]

## 2021-06-21 NOTE — Anesthesia Preprocedure Evaluation (Addendum)
Anesthesia Evaluation  Patient identified by MRN, date of birth, ID band Patient awake    Reviewed: Allergy & Precautions, NPO status , Patient's Chart, lab work & pertinent test results, reviewed documented beta blocker date and time   History of Anesthesia Complications Negative for: history of anesthetic complications  Airway Mallampati: II  TM Distance: >3 FB Neck ROM: Full    Dental  (+) Dental Advisory Given, Missing, Teeth Intact,    Pulmonary neg shortness of breath, neg COPD, neg recent URI, Current Smoker and Patient abstained from smoking.,    breath sounds clear to auscultation       Cardiovascular hypertension, Pt. on home beta blockers and Pt. on medications + Peripheral Vascular Disease   Rhythm:Regular Rate:Normal     Neuro/Psych negative neurological ROS  negative psych ROS   GI/Hepatic negative GI ROS, Neg liver ROS,   Endo/Other  diabetes, Type 2, Oral Hypoglycemic Agents  Renal/GU negative Renal ROS     Musculoskeletal negative musculoskeletal ROS (+)   Abdominal   Peds  Hematology  (+) Blood dyscrasia, anemia , Lab Results      Component                Value               Date                      WBC                      11.9 (H)            06/20/2021                HGB                      8.2 (L)             06/20/2021                HCT                      26.3 (L)            06/20/2021                MCV                      84.8                06/20/2021                PLT                      567 (H)             06/20/2021              Anesthesia Other Findings Day of su  Reproductive/Obstetrics                            Anesthesia Physical Anesthesia Plan  ASA: 3  Anesthesia Plan: MAC and Regional   Post-op Pain Management:    Induction:   PONV Risk Score and Plan: 0 and Propofol infusion and Treatment may vary due to age or medical  condition  Airway Management Planned: Nasal Cannula  Additional Equipment: None  Intra-op Plan:   Post-operative  Plan:   Informed Consent: I have reviewed the patients History and Physical, chart, labs and discussed the procedure including the risks, benefits and alternatives for the proposed anesthesia with the patient or authorized representative who has indicated his/her understanding and acceptance.     Dental advisory given  Plan Discussed with: CRNA and Surgeon  Anesthesia Plan Comments:         Anesthesia Quick Evaluation

## 2021-06-21 NOTE — Transfer of Care (Signed)
Immediate Anesthesia Transfer of Care Note  Patient: Todd Duran  Procedure(s) Performed: RIGHT BELOW KNEE AMPUTATION (Right: Knee)  Patient Location: PACU  Anesthesia Type:General  Level of Consciousness: drowsy  Airway & Oxygen Therapy: Patient Spontanous Breathing  Post-op Assessment: Report given to RN and Post -op Vital signs reviewed and stable  Post vital signs: Reviewed and stable  Last Vitals:  Vitals Value Taken Time  BP 124/72 06/21/21 0930  Temp    Pulse 82 06/21/21 0935  Resp 22 06/21/21 0935  SpO2 98 % 06/21/21 0935  Vitals shown include unvalidated device data.  Last Pain:  Vitals:   06/21/21 0651  TempSrc:   PainSc: 9       Patients Stated Pain Goal: 0 (06/18/21 0432)  Complications: No notable events documented.

## 2021-06-21 NOTE — Interval H&P Note (Signed)
History and Physical Interval Note:  06/21/2021 7:44 AM  Todd Duran  has presented today for surgery, with the diagnosis of Infected Right Foot.  The various methods of treatment have been discussed with the patient and family. After consideration of risks, benefits and other options for treatment, the patient has consented to  Procedure(s): AMPUTATION BELOW KNEE (Right) as a surgical intervention.  The patient's history has been reviewed, patient examined, no change in status, stable for surgery.  I have reviewed the patient's chart and labs.  Questions were answered to the patient's satisfaction.     [name]

## 2021-06-21 NOTE — Anesthesia Procedure Notes (Signed)
Procedure Name: LMA Insertion Date/Time: 06/21/2021 7:58 AM Performed by: Dairl Ponder, CRNA Pre-anesthesia Checklist: Patient identified, Emergency Drugs available, Suction available and Patient being monitored Patient Re-evaluated:Patient Re-evaluated prior to induction Oxygen Delivery Method: Circle System Utilized Preoxygenation: Pre-oxygenation with 100% oxygen Induction Type: IV induction Ventilation: Mask ventilation without difficulty LMA: LMA inserted LMA Size: 4.0 Number of attempts: 1 Airway Equipment and Method: Bite block Placement Confirmation: positive ETCO2 Tube secured with: Tape Dental Injury: Teeth and Oropharynx as per pre-operative assessment

## 2021-06-22 ENCOUNTER — Encounter (HOSPITAL_COMMUNITY): Payer: Self-pay | Admitting: Surgery

## 2021-06-22 LAB — GLUCOSE, CAPILLARY
Glucose-Capillary: 141 mg/dL — ABNORMAL HIGH (ref 70–99)
Glucose-Capillary: 128 mg/dL — ABNORMAL HIGH (ref 70–99)
Glucose-Capillary: 196 mg/dL — ABNORMAL HIGH (ref 70–99)
Glucose-Capillary: 119 mg/dL — ABNORMAL HIGH (ref 70–99)
Glucose-Capillary: 112 mg/dL — ABNORMAL HIGH (ref 70–99)

## 2021-06-22 MED ORDER — CLOPIDOGREL BISULFATE 75 MG PO TABS
75.0000 mg | ORAL_TABLET | Freq: Every day | ORAL | Status: DC
  Administered 2021-06-22 – 2021-06-23 (×2): 75 mg via ORAL
  Filled 2021-06-22 (×2): qty 1

## 2021-06-22 NOTE — PMR Pre-admission (Signed)
PMR Admission Coordinator Pre-Admission Assessment  Patient: Todd Duran is an 75 y.o., male MRN: [id number] DOB: 07-14-1946 Height: _0  (180.3 cm) Weight: 56.5 kg  Insurance Information  PRIMARY: [facility]      Policy#: [id number]      Subscriber: pt CM Name: [facility]      Phone#: [phone]     Fax#: [phone] Pre-Cert#: TBD   approved for 30 days   Employer:  Benefits:  Phone #: [phone]      Eff. Date: active     Deduct:       Out of Pocket Max:       Life Max:  CIR: Per [facility] contract       Providers: VA network  SECONDARY:       Policy#:      Phone#:   Development worker, community:       Phone#:   The Engineer, petroleum" for patients in Inpatient Rehabilitation Facilities with attached "Privacy Act [place] Records" was provided and verbally reviewed with: N/A  Emergency Contact Information Contact Information     Name Relation Home Work Mobile   [name] Spouse   [phone]   [name] Sister   [phone]   [name], [name]   [phone]       Current Medical History  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 Presented 05/12/2021 with progressive left foot pain for 2 months with ischemic changes and findings of decreased femoral pulses bilaterally. Underwent ultrasound guided left femoral artery with abdominal aortogram showing bilateral renal artery stenosis. 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 He was initially admitted to inpatient acute rehab services on 05/15/2021.  Close monitoring of BKA site noted to be boggy necrotic area concerning along incision line with vascular surgery notified for follow up. He was discharged to acute services on 05/20/2021 and underwent left AKA by Dr Carlis Abbott. He was readmitted  to inpatient rehab services on 05/25/2021.  He was doing well with rehab services and left AKA was doing well, however noted ischemic changes to the right fifth toe with ulceration and felt that amputation was imminent. Patient discharged to acute services on 06/05/2021.  He underwent right iliofemoral enterctomy with Bovin patch angioplasty as well as right fifth toe amputation including metatarsal head by Dr Trula Slade. He was NWB. Patient with persistent ischemic changes on the dorsum of the right foot following hi bypass on 06/10/2021. 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 He has been cleared to begin SQ heparin for DVT prophylaxis. Acute blood loss anemia to be monitored. Hgb 6.7 8/16 and transfused.  Patient's medical record from [facility]  has been reviewed by the rehabilitation admission coordinator and physician.  Past Medical History  Past Medical History:  Diagnosis Date   Diabetes mellitus without complication ([place])    Hypertension     Family History   family history is not on file.  Prior Rehab/Hospitalizations Has the patient had prior rehab or hospitalizations prior to admission? Yes CIR twice 05/2021 and returned to acute hospital  Has the patient had major surgery during 100 days prior to admission? Yes   Current Medications  Current Facility-Administered Medications:    0.9 %  sodium chloride infusion, 500 mL, Intravenous, Once PRN, Setzer,  Edman Circle, PA-C   acetaminophen (TYLENOL) tablet 325-650 mg, 325-650 mg, Oral, Q4H PRN, 650 mg at 06/16/21 1653 **OR** acetaminophen (TYLENOL) suppository 325-650 mg, 325-650 mg, Rectal, Q4H PRN, Setzer, Edman Circle, PA-C   alum & mag hydroxide-simeth (MAALOX/MYLANTA) 200-200-20 MG/5ML suspension 15-30 mL, 15-30 mL, Oral, Q2H PRN, Setzer, Edman Circle, PA-C   amLODipine (NORVASC) tablet 10 mg, 10 mg, Oral, Daily, Setzer, Edman Circle, PA-C, 10 mg at 06/23/21 0848   aspirin EC tablet  81 mg, 81 mg, Oral, Daily, Barbie Banner, PA-C, 81 mg at 06/23/21 0847   atenolol (TENORMIN) tablet 25 mg, 25 mg, Oral, BID, Setzer, Edman Circle, PA-C, 25 mg at 06/23/21 0847   atorvastatin (LIPITOR) tablet 40 mg, 40 mg, Oral, QHS, Setzer, Edman Circle, PA-C, 40 mg at 06/22/21 2129   clopidogrel (PLAVIX) tablet 75 mg, 75 mg, Oral, Daily, Serafina Mitchell, MD, 75 mg at 06/23/21 0847   docusate sodium (COLACE) capsule 100 mg, 100 mg, Oral, Daily, Setzer, Edman Circle, PA-C, 100 mg at 06/23/21 0848   ferrous sulfate tablet 325 mg, 325 mg, Oral, Q breakfast, Setzer, Edman Circle, PA-C, 325 mg at 38/46/65 9935   folic acid (FOLVITE) tablet 1 mg, 1 mg, Oral, Daily, Setzer, Sandra J, PA-C, 1 mg at 06/23/21 0848   gabapentin (NEURONTIN) capsule 100 mg, 100 mg, Oral, BID, Setzer, Edman Circle, PA-C, 100 mg at 06/23/21 0847   guaiFENesin-dextromethorphan (ROBITUSSIN DM) 100-10 MG/5ML syrup 15 mL, 15 mL, Oral, Q4H PRN, Setzer, Edman Circle, PA-C   heparin injection 5,000 Units, 5,000 Units, Subcutaneous, Q8H, Setzer, Edman Circle, PA-C, 5,000 Units at 06/23/21 1415   hydrALAZINE (APRESOLINE) injection 5 mg, 5 mg, Intravenous, Q20 Min PRN, Setzer, Sandra J, PA-C   insulin aspart (novoLOG) injection 0-15 Units, 0-15 Units, Subcutaneous, TID WC, Setzer, Edman Circle, PA-C, 2 Units at 06/23/21 1203   labetalol (NORMODYNE) injection 10 mg, 10 mg, Intravenous, Q10 min PRN, Setzer, Edman Circle, PA-C   magnesium sulfate IVPB 2 g 50 mL, 2 g, Intravenous, Daily PRN, Setzer, Sandra J, PA-C   metFORMIN (GLUCOPHAGE) tablet 500 mg, 500 mg, Oral, Q breakfast, Setzer, Edman Circle, PA-C, 500 mg at 06/23/21 0847   metoprolol tartrate (LOPRESSOR) injection 2-5 mg, 2-5 mg, Intravenous, Q2H PRN, Setzer, Edman Circle, PA-C   morphine 2 MG/ML injection 2-5 mg, 2-5 mg, Intravenous, Q1H PRN, Barbie Banner, PA-C, 2 mg at 06/06/21 0547   multivitamin with minerals tablet 1 tablet, 1 tablet, Oral, Daily, Setzer, Edman Circle, PA-C, 1 tablet at 06/23/21 0847   ondansetron  (ZOFRAN) injection 4 mg, 4 mg, Intravenous, Q6H PRN, Setzer, Edman Circle, PA-C   oxybutynin [facility]) tablet 2.5 mg, 2.5 mg, Oral, BID, Setzer, Edman Circle, PA-C, 2.5 mg at 06/23/21 7017   oxyCODONE-acetaminophen (PERCOCET/ROXICET) 5-325 MG per tablet 1-2 tablet, 1-2 tablet, Oral, Q4H PRN, Barbie Banner, PA-C, 2 tablet at 06/23/21 1022   pantoprazole (PROTONIX) EC tablet 40 mg, 40 mg, Oral, Daily, Setzer, Edman Circle, PA-C, 40 mg at 06/23/21 0847   phenol (CHLORASEPTIC) mouth spray 1 spray, 1 spray, Mouth/Throat, PRN, Setzer, Edman Circle, PA-C   polyethylene glycol (MIRALAX / GLYCOLAX) packet 17 g, 17 g, Oral, Daily PRN, Barbie Banner, PA-C, 17 g at 06/12/21 7939  Patients Current Diet:  Diet Order             Diet heart healthy/carb modified Room service appropriate? Yes; Fluid consistency: Thin  Diet effective now  Precautions / Restrictions Precautions Precautions: Fall Precaution Comments: L aka, R BKA (8/14) Other Brace: R darco shoe Restrictions Weight Bearing Restrictions: Yes RLE Weight Bearing: Non weight bearing LLE Weight Bearing: Non weight bearing Other Position/Activity Restrictions: no formal orders for RLE in chart though darco shoe noted in room. Assumed WB through heel (RN 8/5 recommended RLE NWB due to high risk for BKA/AKA.)   Has the patient had 2 or more falls or a fall with injury in the past year? No  Prior Activity Level Community (5-7x/wk): Mod I with RW  Prior Functional Level Self Care: Did the patient need help bathing, dressing, using the toilet or eating? Independent  Indoor Mobility: Did the patient need assistance with walking from room to room (with or without device)? Independent  Stairs: Did the patient need assistance with internal or external stairs (with or without device)? Independent  Functional Cognition: Did the patient need help planning regular tasks such as shopping or remembering to take medications?  Independent  Home Assistive Devices / Equipment Home Assistive Devices/Equipment: Prosthesis, Wheelchair, Bedside commode/3-in-1 Home Equipment: None  Prior Device Use: Indicate devices/aids used by the patient prior to current illness, exacerbation or injury? None of the above  Current Functional Level Cognition  Overall Cognitive Status: No family/caregiver present to determine baseline cognitive functioning Orientation Level: Oriented X4 Following Commands: Follows one step commands consistently Safety/Judgement: Decreased awareness of safety General Comments: patient pleasant    Extremity Assessment (includes Sensation/Coordination)  Upper Extremity Assessment: Overall WFL for tasks assessed  Lower Extremity Assessment: Defer to PT evaluation RLE Deficits / Details: pt able to initiate SLR in bed, would not do ankle pump due to tightness and pain LLE Deficits / Details: pt with good hip ROM in all directions    ADLs  Overall ADL's : Needs assistance/impaired Eating/Feeding: Set up, Sitting Grooming: Set up, Sitting, Oral care Grooming Details (indicate cue type and reason): seated in recliner, able to bend forward and reach appropriately Upper Body Bathing: Sitting, Min guard Lower Body Bathing: Sitting/lateral leans, Maximal assistance Lower Body Bathing Details (indicate cue type and reason): unable to access LLE foot at this time, can access down to knee Upper Body Dressing : Set up, Sitting Lower Body Dressing: Sitting/lateral leans, Maximal assistance Lower Body Dressing Details (indicate cue type and reason): unable to access LLE foot at this time, can access down to knee Toilet Transfer: Moderate assistance, Stand-pivot, Cueing for safety, Cueing for sequencing, BSC, RW Toilet Transfer Details (indicate cue type and reason): to recliner, bed height elevated and mod A to power up to standing. patient able to pivot and take small hop on R foot however poor eccentric  control into chair needing heavy mod A to safely sit into recliner Toileting- Clothing Manipulation and Hygiene: Maximal assistance, Sitting/lateral lean Functional mobility during ADLs: Moderate assistance, Cueing for safety, Cueing for sequencing, Rolling walker General ADL Comments: Discussed BSC transfers practice with pt in next session. able able to demo lateral leans well, likely requiring mostly Min A for LB ADls    Mobility  Overal bed mobility: Modified Independent Bed Mobility: Sit to Supine Supine to sit: Modified independent (Device/Increase time), HOB elevated Sit to supine: Modified independent (Device/Increase time) General bed mobility comments: patient able to reposition in bed well    Transfers  Overall transfer level: Needs assistance Equipment used: None Transfers: Lateral/Scoot Transfers Sit to Stand: Min assist Anterior-Posterior transfers: Min assist, +2 safety/equipment, From elevated surface  Lateral/Scoot Transfers: Min guard General  transfer comment: patient able to scoot from recliner to bed with min guard/supervision. Brought in slide board as he was going uphill, but he did not need.    Ambulation / Gait / Stairs / Emergency planning/management officer  Ambulation/Gait General Gait Details: unable this date Product manager mobility: Yes Wheelchair propulsion: Both upper extremities Wheelchair parts: Supervision/cueing Distance: 400 Wheelchair Assistance Details (indicate cue type and reason): vcs for use of brakes, pt propelled chair and performs turns 180 degrees with smooth technique, min cues for environmental awareness/safety    Posture / Balance Dynamic Sitting Balance Sitting balance - Comments: does well with supervision for rotating body in bed pre/post scoot transfer Balance Overall balance assessment: Needs assistance Sitting-balance support: Feet unsupported, No upper extremity supported Sitting balance-Leahy Scale: Good Sitting balance -  Comments: does well with supervision for rotating body in bed pre/post scoot transfer Postural control: Posterior lean Standing balance support: Bilateral upper extremity supported Standing balance-Leahy Scale: Poor Standing balance comment: unable    Special needs/care consideration    Previous Home Environment  Living Arrangements: Spouse/significant other  Lives With: Spouse, Family Available Help at Discharge: Available 24 hours/day Type of Home: House Home Layout: Multi-level Alternate Level Stairs-Number of Steps: Reports his bed/bath in downstairs, Home Access: Stairs to enter Entrance Stairs-Rails: Right, Left Entrance Stairs-Number of Steps: 4 Bathroom Shower/Tub: Chiropodist: Standard Bathroom Accessibility: Yes How Accessible: Accessible via walker Home Care Services: No Additional Comments: pt was d/c'd from CIR to have surgery, plan is to return. Pt lives with spouse typically who can provide 24/7 assist.  Discharge Living Setting Plans for Discharge Living Setting: Patient's home, Lives with (comment) Type of Home at Discharge: House Discharge Home Layout: Multi-level Alternate Level Stairs-Number of Steps: 4 steps to bedroom Discharge Home Access: Stairs to enter Entrance Stairs-Rails: Right, Left, Can reach both Entrance Stairs-Number of Steps: 4 Discharge Bathroom Shower/Tub: Tub/shower unit Discharge Bathroom Toilet: Standard Discharge Bathroom Accessibility: Yes How Accessible: Accessible via walker Does the patient have any problems obtaining your medications?: No  Social/Family/Support Systems Patient Roles: Spouse Contact Information: wife Anticipated Caregiver: Wife Anticipated Caregiver's Contact Information: see above Ability/Limitations of Caregiver: wife taking fmla as needed Caregiver Availability: 24/7 Discharge Plan Discussed with Primary Caregiver: Yes Is Caregiver In Agreement with Plan?: Yes Does Caregiver/Family  have Issues with Lodging/Transportation while Pt is in Rehab?: No  Goals Patient/Family Goal for Rehab: Mod I to supervision with PT at wheelchair level and supervision to min wiht OT at wheelchair level Expected length of stay: ELOS 2 weeks Pt/Family Agrees to Admission and willing to participate: Yes Program Orientation Provided & Reviewed with Pt/Caregiver Including Roles  & Responsibilities: Yes  Decrease burden of Care through IP rehab admission: n/a  Possible need for SNF placement upon discharge: not anticipated  Patient Condition: I have reviewed medical records from [facility] , spoken with CM, and patient and spouse. I met with patient at the bedside for inpatient rehabilitation assessment.  Patient will benefit from ongoing PT and OT, can actively participate in 3 hours of therapy a day 5 days of the week, and can make measurable gains during the admission.  Patient will also benefit from the coordinated team approach during an Inpatient Acute Rehabilitation admission.  The patient will receive intensive therapy as well as Rehabilitation physician, nursing, social worker, and care management interventions.  Due to bladder management, bowel management, safety, skin/wound care, disease management, medication administration, pain management, and patient education the patient requires  24 hour a day rehabilitation nursing.  The patient is currently min guard assist with mobility and basic ADLs.  Discharge setting and therapy post discharge at home with home health is anticipated.  Patient has agreed to participate in the Acute Inpatient Rehabilitation Program and will admit today.  Preadmission Screen Completed By:  Cleatrice Burke, 06/23/2021 3:48 PM ______________________________________________________________________   Discussed status with Dr. Naaman Plummer on  06/23/2021 at 1549 and received approval for admission today.  Admission Coordinator:  Cleatrice Burke, RN, time  1829 Date 06/23/2021   Assessment/Plan: Diagnosis: Right bka, prior left aka Does the need for close, 24 hr/day Medical supervision in concert with the patient's rehab needs make it unreasonable for this patient to be served in a less intensive setting? Yes Co-Morbidities requiring supervision/potential complications: DM, HTN, PAD Due to bladder management, bowel management, safety, skin/wound care, disease management, medication administration, pain management, and patient education, does the patient require 24 hr/day rehab nursing? Yes Does the patient require coordinated care of a physician, rehab nurse, PT, OT to address physical and functional deficits in the context of the above medical diagnosis(es)? Yes Addressing deficits in the following areas: balance, endurance, locomotion, strength, transferring, bowel/bladder control, bathing, dressing, feeding, grooming, toileting, and psychosocial support Can the patient actively participate in an intensive therapy program of at least 3 hrs of therapy 5 days a week? Yes The potential for patient to make measurable gains while on inpatient rehab is excellent Anticipated functional outcomes upon discharge from inpatient rehab: modified independent and supervision PT, modified independent, supervision, and min assist OT, n/a SLP Estimated rehab length of stay to reach the above functional goals is: 1-2 weeks Anticipated discharge destination: Home 10. Overall Rehab/Functional Prognosis: excellent   MD Signature: [name], MD, [facility] 06/23/2021

## 2021-06-22 NOTE — Progress Notes (Signed)
Mobility Specialist: Progress Note   06/22/21 1349  Mobility  Activity Transferred:  Chair to bed  Level of Assistance Contact guard assist, steadying assist  Assistive Device None  Mobility Out of bed to chair with meals  Mobility Response Tolerated well  Mobility performed by Mobility specialist;Other (comment) (PT Charline Bills)  $Mobility charge 1 Mobility   Pre-Mobility: 82 HR Post-Mobility: 82 HR  Pt required contact guard during transfer from recliner to chair but was able to independently scoot from the chair to the bed without physical assistance. Pt back in bed with call bell and phone at his side. Bed alarm is on.   [facility] Reneka Nebergall Mobility Specialist Mobility Specialist Phone: [phone]

## 2021-06-22 NOTE — Progress Notes (Signed)
Occupational Therapy Treatment/Re-Evaluation Patient Details Name: Todd Duran MRN: [id number] DOB: 1946/03/04 Today's Date: 06/22/2021    History of present illness Pt is a 75 yo male admitted on 7/29 from CIR due to R lower limb ischemia. Pt underwent R femoral endarterectomy, R iliac & superficial fem art stent, R 5th toe amp. 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 PMH: L AKA (05/13/21), DM, HTN   OT comments  Pt seen for first OT session s/p R BKA. Pt reports significant pain in residual limb though able to perform bed mobility and lateral scoot to recliner well at min guard. Once up in chair, pt able to perform basic UB ADLs with Setup assist only. Reinforced strategies for [facility] transfers and further wheelchair mobility during ADLs with plans to address in next session. Encouraged completion of UE HEP after lunch. Pt remains motivated to return to CIR, participate in therapy and maximize independence.   Follow Up Recommendations  CIR    Equipment Recommendations  Tub/shower bench;Wheelchair (measurements OT);Wheelchair cushion (measurements OT) (drop arm BSC)    Recommendations for Other Services Rehab consult    Precautions / Restrictions Precautions Precautions: Fall Precaution Comments: L aka, R BKA (8/14) Restrictions Weight Bearing Restrictions: Yes RLE Weight Bearing: Non weight bearing LLE Weight Bearing: Non weight bearing       Mobility Bed Mobility Overal bed mobility: Modified Independent Bed Mobility: Supine to Sit     Supine to sit: Modified independent (Device/Increase time);HOB elevated          Transfers Overall transfer level: Needs assistance Equipment used: None Transfers: Lateral/Scoot Transfers          Lateral/Scoot Transfers: Min guard General transfer comment: min guard for safety in scooting from bed to drop arm recliner, able to  cross gap without physical assist    Balance Overall balance assessment: Needs assistance Sitting-balance support: Feet unsupported;No upper extremity supported Sitting balance-Leahy Scale: Good                                     ADL either performed or assessed with clinical judgement   ADL Overall ADL's : Needs assistance/impaired Eating/Feeding: Set up;Sitting   Grooming: Set up;Sitting;Oral care Grooming Details (indicate cue type and reason): seated in recliner, able to bend forward and reach appropriately                               General ADL Comments: Discussed BSC transfers practice with pt in next session. able able to demo lateral leans well, likely requiring mostly Min A for LB ADls     Vision   Vision Assessment?: No apparent visual deficits   Perception     Praxis      Cognition Arousal/Alertness: Awake/alert Behavior During Therapy: WFL for tasks assessed/performed Overall Cognitive Status: Impaired/Different from baseline Area of Impairment: Awareness;Problem solving;Memory                     Memory: Decreased short-term memory     Awareness: Emergent Problem Solving: Slow processing;Requires verbal cues General Comments: much improved affect, able to direct assistance needed well        Exercises     Shoulder Instructions       General Comments  VSS onRA    Pertinent Vitals/ Pain       Pain Assessment: 0-10 Pain Score: 8  Pain Location: R residual limb Pain Descriptors / Indicators: Grimacing;Sore Pain Intervention(s): Monitored during session;Premedicated before session  Home Living                                          Prior Functioning/Environment              Frequency  Min 2X/week        Progress Toward Goals  OT Goals(current goals can now be found in the care plan section)  Progress towards OT goals: Progressing toward goals  Acute Rehab OT  Goals Patient Stated Goal: rehab and go home OT Goal Formulation: With patient Time For Goal Achievement: 06/20/21 Potential to Achieve Goals: Good  Plan Discharge plan remains appropriate    Co-evaluation                 AM-PAC OT "6 Clicks" Daily Activity     Outcome Measure   Help from another person eating meals?: None Help from another person taking care of personal grooming?: A Little Help from another person toileting, which includes using toliet, bedpan, or urinal?: A Lot Help from another person bathing (including washing, rinsing, drying)?: A Little Help from another person to put on and taking off regular upper body clothing?: A Little Help from another person to put on and taking off regular lower body clothing?: A Little 6 Click Score: 18    End of Session    OT Visit Diagnosis: Unsteadiness on feet (R26.81);Other abnormalities of gait and mobility (R26.89);Muscle weakness (generalized) (M62.81);Pain;Other symptoms and signs involving cognitive function Pain - Right/Left: Right Pain - part of body: Leg   Activity Tolerance Patient tolerated treatment well   Patient Left in chair;with call bell/phone within reach;with chair alarm set   Nurse Communication Mobility status        Time: 3212-2482 OT Time Calculation (min): 21 min  Charges: OT General Charges $OT Visit: 1 Visit OT Evaluation $OT Re-eval: 1 Re-eval  Bradd Canary, OTR/L Acute Rehab Services Office: [phone]    Lorre Munroe 06/22/2021, 12:29 PM

## 2021-06-22 NOTE — H&P (Signed)
Physical Medicine and Rehabilitation Admission H&P     HPI: Todd Duran. Todd Duran is a 75 year old right-handed male history of diabetes mellitus, hypertension ,tobacco and alcohol use.  Lives with spouse independent prior to admission.  Spouse works during the day.  Presented 05/13/2021 with progressive left foot pain x2 months with ischemic changes and findings of decreased femoral pulses bilaterally.  Underwent ultrasound-guided left femoral artery with abdominal aortogram showing bilateral renal artery stenosis.  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 Myra Gianotti.  [facility] course anemia as well as hyponatremia 130.  He was cleared to begin subcutaneous heparin for DVT prophylaxis.  Therapy evaluations completed and he was admitted to inpatient rehab services 05/15/2021 with slow progressive gains.  Close monitoring of BKA site noted to be boggy necrotic area concerning along incision line with vascular surgery notified for follow-up.  A few the patient staples were removed noted underlying abscess with poor healing and noted increased necrosis.  He was discharged to acute care services 05/20/2021 and underwent left AKA per Dr. Chestine Spore.  Postoperative course anemia transfused 2 units packed red blood cells.  He was readmitted back to inpatient rehab services 05/25/2021 he was doing well after his left AKA however noted ischemic changes to the right fifth toe with ulceration and felt that amputation was imminent as well as need for revascularization thus he was discharged back to acute care services 06/05/2021 and underwent right iliofemoral enterectomy with bovine patch angioplasty as well as right fifth toe amputation including metatarsal head per Dr. Myra Gianotti..  He was nonweightbearing to left lower extremity and weightbearing as tolerated right lower extremity with Darco shoe boot.  Patient with persistent ischemic  changes on the dorsum of the right foot following his bypass and underwent redo right femoral artery exposure right common femoral to above-knee popliteal artery bypass graft with ipsilateral and on reverse greater saphenous vein as well as right fourth and fifth toe amputation including metatarsal head 06/10/2021 unfortunately his foot remained significantly ischemic with foul odor and necrotic tissue and underwent right BKA 06/21/2021 per Dr. Myra Gianotti..  He has been cleared to begin subcutaneous heparin for DVT prophylaxis.  Acute blood loss anemia and monitored.  Therapy evaluations again completed and patient was readmitted back to inpatient rehab services to resume inpatient comprehensive therapies  Review of Systems  Constitutional:  Positive for fever. Negative for chills.  HENT:  Negative for hearing loss.   Eyes:  Negative for blurred vision and double vision.  Respiratory:  Negative for cough.        Shortness of breath with exertion  Cardiovascular:  Positive for leg swelling. Negative for chest pain and palpitations.  Gastrointestinal:  Positive for constipation. Negative for heartburn, nausea and vomiting.  Genitourinary:  Negative for dysuria and flank pain.  Musculoskeletal:  Positive for myalgias.  Skin:  Negative for rash.  All other systems reviewed and are negative. Past Medical History:  Diagnosis Date   Diabetes mellitus without complication (HCC)    Hypertension    Past Surgical History:  Procedure Laterality Date   ABDOMINAL AORTOGRAM W/LOWER EXTREMITY N/A 05/12/2021   Procedure: ABDOMINAL AORTOGRAM W/LOWER EXTREMITY;  Surgeon: Nada Libman, MD;  Location: [facility];  Service: Cardiovascular;  Laterality: N/A;   AMPUTATION Left 05/13/2021   Procedure: AMPUTATION BELOW KNEE LEFT ;  Surgeon: Nada Libman, MD;  Location: [facility] OR;  Service: Vascular;  Laterality: Left;   AMPUTATION Left 05/20/2021   Procedure: AMPUTATION ABOVE KNEE;  Surgeon: Cephus Shelling,  MD;  Location: [facility] OR;  Service: Vascular;  Laterality: Left;   AMPUTATION Right 06/05/2021   Procedure: RIGHT FIFTH TOE AMPUTATION;  Surgeon: Nada Libman, MD;  Location: [facility] OR;  Service: Vascular;  Laterality: Right;   AMPUTATION Right 06/10/2021   Procedure: RIGHT THIRD AND  FOURTH TOE AMPUTATION;  Surgeon: Nada Libman, MD;  Location: [facility] OR;  Service: Vascular;  Laterality: Right;   AMPUTATION Right 06/21/2021   Procedure: RIGHT BELOW KNEE AMPUTATION;  Surgeon: Nada Libman, MD;  Location: [facility] OR;  Service: Vascular;  Laterality: Right;   ENDARTERECTOMY FEMORAL Right 06/05/2021   Procedure: RIGHT FEMORAL ENDARTERECTOMY;  Surgeon: Nada Libman, MD;  Location: [facility] OR;  Service: Vascular;  Laterality: Right;   FEMORAL-POPLITEAL BYPASS GRAFT Right 06/10/2021   Procedure: RIGHT FEMORAL TO ABOVE KNEE POPLITEAL ARTERY BYPASS GRAFTING USING RIGHT NON REVERSED GREATER SAPHENOUS VEIN;  Surgeon: Nada Libman, MD;  Location: [facility] OR;  Service: Vascular;  Laterality: Right;   HERNIA REPAIR     INSERTION OF ILIAC STENT Right 06/05/2021   Procedure: INSERTION OF RIGHT ILIAC AND SUPERFICIAL FEMORAL ARTERY STENT;  Surgeon: Nada Libman, MD;  Location: [facility] OR;  Service: Vascular;  Laterality: Right;   KNEE SURGERY     PATCH ANGIOPLASTY Right 06/05/2021   Procedure: PATCH ANGIOPLASTY OF RIGHT FEMORAL ARTERY USING XENOSURE BIOLOGIC PATCH;  Surgeon: Nada Libman, MD;  Location: [facility] OR;  Service: Vascular;  Laterality: Right;   VEIN HARVEST Right 06/10/2021   Procedure: VEIN HARVEST OF RIGHT GREATER SAPHENOUS VEIN;  Surgeon: Nada Libman, MD;  Location: [facility] OR;  Service: Vascular;  Laterality: Right;   History reviewed. No pertinent family history. Social History:  reports that he has been smoking cigarettes. He has been smoking an average of .5 packs per day. He has never used smokeless tobacco. He reports current alcohol use. He reports that he does not use drugs. Allergies:  Allergies  Allergen  Reactions   Lisinopril Swelling    Swelling of lips   Medications Prior to Admission  Medication Sig Dispense Refill   amLODipine (NORVASC) 10 MG tablet Take 10 mg by mouth daily.     aspirin 81 MG EC tablet Take 1 tablet (81 mg total) by mouth daily. Swallow whole. 30 tablet 11   atenolol (TENORMIN) 25 MG tablet Take 1 tablet (25 mg total) by mouth 2 (two) times daily. (Patient taking differently: Take 100 mg by mouth daily.)     atorvastatin (LIPITOR) 20 MG tablet Take 20 mg by mouth at bedtime.     cyanocobalamin 1000 MCG tablet Take 1,000 mcg by mouth daily.     gabapentin (NEURONTIN) 100 MG capsule Take 1 capsule (100 mg total) by mouth 2 (two) times daily. (Patient taking differently: Take 100 mg by mouth 3 (three) times daily.)     hydrochlorothiazide (HYDRODIURIL) 25 MG tablet Take 25 mg by mouth daily.     metFORMIN (GLUCOPHAGE) 500 MG tablet Take 500 mg by mouth daily with breakfast.     methocarbamol (ROBAXIN) 750 MG tablet Take 750 mg by mouth 4 (four) times daily.     oxybutynin (DITROPAN) 5 MG tablet Take 0.5 tablets (2.5 mg total) by mouth 2 (two) times daily.     diclofenac Sodium (VOLTAREN) 1 % GEL Apply 2 g topically 3 (three) times daily.     ferrous sulfate  325 (65 FE) MG tablet Take 1 tablet (325 mg total) by mouth daily with breakfast.  3   folic acid (FOLVITE) 1 MG tablet Take 1 tablet (1 mg total) by mouth daily.     Multiple Vitamin (MULTIVITAMIN WITH MINERALS) TABS tablet Take 1 tablet by mouth daily.     oxyCODONE-acetaminophen (PERCOCET/ROXICET) 5-325 MG tablet Take 1-2 tablets by mouth every 4 (four) hours as needed for moderate pain. 30 tablet 0   polyethylene glycol (MIRALAX / GLYCOLAX) 17 g packet Take 17 g by mouth daily as needed for mild constipation. 14 each 0    Drug Regimen Review Drug regimen was reviewed and remains appropriate with no significant issues identified  Home: Home Living Family/patient expects to be discharged to:: Private  residence Living Arrangements: Spouse/significant other Available Help at Discharge: Available 24 hours/day Type of Home: House Home Access: Stairs to enter [employer] of Steps: 4 Entrance Stairs-Rails: Right, Left Home Layout: Multi-level Alternate Level Stairs-Number of Steps: Reports his bed/bath in downstairs, Bathroom Shower/Tub: Engineer, manufacturing systems: Standard Bathroom Accessibility: Yes Home Equipment: None Additional Comments: pt was d/c'd from CIR to have surgery, plan is to return. Pt lives with spouse typically who can provide 24/7 assist.  Lives With: Spouse, Family   Functional History: Prior Function Level of Independence: Needs assistance Gait / Transfers Assistance Needed: pt doing lateral scoot and std pvt transfers in CIR ADL's / Homemaking Assistance Needed: working with OT in CIR Comments: Prior to initial BKA, pt was independent in all tasks  Functional Status:  Mobility: Bed Mobility Overal bed mobility: Modified Independent Bed Mobility: Sit to Supine Supine to sit: Modified independent (Device/Increase time), HOB elevated Sit to supine: Modified independent (Device/Increase time) General bed mobility comments: patient able to reposition in bed well Transfers Overall transfer level: Needs assistance Equipment used: None Transfers: Lateral/Scoot Transfers Sit to Stand: Min assist Anterior-Posterior transfers: Min assist, +2 safety/equipment, From elevated surface  Lateral/Scoot Transfers: Min guard General transfer comment: patient able to scoot from recliner to bed with min guard/supervision. Brought in slide board as he was going uphill, but he did not need. Ambulation/Gait General Gait Details: unable this date Wheelchair Mobility Wheelchair mobility: Yes Wheelchair propulsion: Both upper extremities Wheelchair parts: Supervision/cueing Distance: 400 Wheelchair Assistance Details (indicate cue type and reason): vcs for use  of brakes, pt propelled chair and performs turns 180 degrees with smooth technique, min cues for environmental awareness/safety  ADL: ADL Overall ADL's : Needs assistance/impaired Eating/Feeding: Set up, Sitting Grooming: Set up, Sitting, Oral care Grooming Details (indicate cue type and reason): seated in recliner, able to bend forward and reach appropriately Upper Body Bathing: Sitting, Min guard Lower Body Bathing: Sitting/lateral leans, Maximal assistance Lower Body Bathing Details (indicate cue type and reason): unable to access LLE foot at this time, can access down to knee Upper Body Dressing : Set up, Sitting Lower Body Dressing: Sitting/lateral leans, Maximal assistance Lower Body Dressing Details (indicate cue type and reason): unable to access LLE foot at this time, can access down to knee Toilet Transfer: Moderate assistance, Stand-pivot, Cueing for safety, Cueing for sequencing, BSC, RW Toilet Transfer Details (indicate cue type and reason): to recliner, bed height elevated and mod A to power up to standing. patient able to pivot and take small hop on R foot however poor eccentric control into chair needing heavy mod A to safely sit into recliner Toileting- Clothing Manipulation and Hygiene: Maximal assistance, Sitting/lateral lean Functional mobility during ADLs: Moderate assistance,  Cueing for safety, Cueing for sequencing, Rolling walker General ADL Comments: Discussed BSC transfers practice with pt in next session. able able to demo lateral leans well, likely requiring mostly Min A for LB ADls  Cognition: Cognition Overall Cognitive Status: No family/caregiver present to determine baseline cognitive functioning Orientation Level: Oriented X4 Cognition Arousal/Alertness: Awake/alert Behavior During Therapy: WFL for tasks assessed/performed Overall Cognitive Status: No family/caregiver present to determine baseline cognitive functioning Area of Impairment: Memory Memory:  Decreased short-term memory Following Commands: Follows one step commands consistently Safety/Judgement: Decreased awareness of safety Awareness: Emergent Problem Solving: Slow processing, Requires verbal cues General Comments: patient pleasant  Physical Exam: Blood pressure 126/78, pulse 87, temperature 98.5 F (36.9 C), temperature source Oral, resp. rate 13, height 5\' 11"  (1.803 m), weight 56.5 kg, SpO2 100 %. Physical Exam Constitutional:      Appearance: He is ill-appearing.  HENT:     Right Ear: External ear normal.     Left Ear: External ear normal.     Mouth/Throat:     Mouth: Mucous membranes are moist.  Eyes:     Extraocular Movements: Extraocular movements intact.     Pupils: Pupils are equal, round, and reactive to light.  Cardiovascular:     Rate and Rhythm: Normal rate and regular rhythm.     Heart sounds: No murmur heard. Pulmonary:     Effort: Pulmonary effort is normal. No respiratory distress.     Breath sounds: No wheezing.  Abdominal:     General: Bowel sounds are normal.     Palpations: Abdomen is soft.  Skin:    General: Skin is warm.     Comments: Left AKA is well-healed.  Right BKA with limb guard protector in place appropriately tender. Minimal drainage from well-approximated incision  Neurological:     Mental Status: He is alert and oriented to person, place, and time.     Cranial Nerves: No cranial nerve deficit.     Sensory: No sensory deficit.     Comments: Patient is alert.  Sitting up in bed.  Pink eye contact with examiner.  Follows commands and oriented x3. Motor 5/5 UE. RLE 3-4/5 and limited somewhat by pain. LLE 4/5 HF. No focal sensory deficits  Psychiatric:        Mood and Affect: Mood normal.        Behavior: Behavior normal.    Results for orders placed or performed during the hospital encounter of 06/05/21 (from the past 48 hour(s))  Glucose, capillary     Status: Abnormal   Collection Time: 06/20/21  5:33 PM  Result Value Ref  Range   Glucose-Capillary 118 (H) 70 - 99 mg/dL    Comment: Glucose reference range applies only to samples taken after fasting for at least 8 hours.  Glucose, capillary     Status: Abnormal   Collection Time: 06/20/21  9:15 PM  Result Value Ref Range   Glucose-Capillary 134 (H) 70 - 99 mg/dL    Comment: Glucose reference range applies only to samples taken after fasting for at least 8 hours.  Glucose, capillary     Status: Abnormal   Collection Time: 06/21/21  6:11 AM  Result Value Ref Range   Glucose-Capillary 110 (H) 70 - 99 mg/dL    Comment: Glucose reference range applies only to samples taken after fasting for at least 8 hours.  Glucose, capillary     Status: Abnormal   Collection Time: 06/21/21  9:30 AM  Result Value Ref Range  Glucose-Capillary 121 (H) 70 - 99 mg/dL    Comment: Glucose reference range applies only to samples taken after fasting for at least 8 hours.  Glucose, capillary     Status: Abnormal   Collection Time: 06/21/21 11:46 AM  Result Value Ref Range   Glucose-Capillary 146 (H) 70 - 99 mg/dL    Comment: Glucose reference range applies only to samples taken after fasting for at least 8 hours.  Glucose, capillary     Status: Abnormal   Collection Time: 06/21/21  4:57 PM  Result Value Ref Range   Glucose-Capillary 284 (H) 70 - 99 mg/dL    Comment: Glucose reference range applies only to samples taken after fasting for at least 8 hours.  Glucose, capillary     Status: Abnormal   Collection Time: 06/21/21  9:05 PM  Result Value Ref Range   Glucose-Capillary 166 (H) 70 - 99 mg/dL    Comment: Glucose reference range applies only to samples taken after fasting for at least 8 hours.  Glucose, capillary     Status: Abnormal   Collection Time: 06/22/21  6:16 AM  Result Value Ref Range   Glucose-Capillary 141 (H) 70 - 99 mg/dL    Comment: Glucose reference range applies only to samples taken after fasting for at least 8 hours.  Glucose, capillary     Status:  Abnormal   Collection Time: 06/22/21  7:34 AM  Result Value Ref Range   Glucose-Capillary 128 (H) 70 - 99 mg/dL    Comment: Glucose reference range applies only to samples taken after fasting for at least 8 hours.  Glucose, capillary     Status: Abnormal   Collection Time: 06/22/21 11:31 AM  Result Value Ref Range   Glucose-Capillary 196 (H) 70 - 99 mg/dL    Comment: Glucose reference range applies only to samples taken after fasting for at least 8 hours.   No results found.     Medical Problem List and Plan: 1.  Debility secondary to left AKA 05/20/2021 and right BKA 06/21/2021 as well as revascularization  -patient may shower if incisions are covered  -ELOS/Goals: 1-2 weeks, mod I to supervision goals 2.  Antithrombotics: -DVT/anticoagulation: Subcutaneous heparin Pharmaceutical: Heparin  -antiplatelet therapy: Aspirin 81 mg daily and Plavix 75 mg daily 3. Pain Management: Neurontin 100 mg twice daily, oxycodone as needed 4. Mood: Provide emotional support  -antipsychotic agents: N/A 5. Neuropsych: This patient is capable of making decisions on his own behalf. 6. Skin/Wound Care: Routine skin checks  -shrinker LLE  -limb guard RLE with dressing 7. Fluids/Electrolytes/Nutrition: Routine in and outs with follow-up chemistries 8.  Acute blood loss anemia.  Continue iron supplement.  Follow-up CBC 9.  Diabetes mellitus with peripheral neuropathy.  Hemoglobin A1c 5.2.  Glucophage 500 mg daily 10.  Hypertension.  Norvasc 10 mg daily, Tenormin 25 mg twice daily.  Monitor with increased mobility 11.  Hyperlipidemia.  Lipitor 12.  History of alcohol tobacco use.  Provide counseling   Charlton AmorDaniel J Angiulli, PA-C 06/22/2021

## 2021-06-22 NOTE — Progress Notes (Signed)
Inpatient Rehabilitation Admissions Coordinator   I met with patient at bedside and spoke with his wife by phone. They prefer readmit to CIR when medically ready. I await therapy postoperative evaluations and then will proceed with [facility] approval process for readmit to CIR .  Patient and wife are aware and in agreement.  Danne Baxter, RN, MSN Rehab Admissions Coordinator [phone] 06/22/2021 11:55 AM

## 2021-06-22 NOTE — Progress Notes (Addendum)
    Subjective  - POD #1, status post right below-knee amputation  Seems to be feeling okay this morning   Physical Exam:  Dressing with 1 area of drainage which has been stable overnight. Left above-knee amputation without complication       Assessment/Plan:  POD #1, status post right BKA  Patient will need PT, OT, and hopefully rehab. Continue to monitor right groin Will add Plavix now that his surgery is completed, given his iliac stent Continue statin therapy Check labs tomorrow morning  Todd Duran 06/22/2021 6:51 AM --  Vitals:   06/21/21 2314 06/22/21 0331  BP: 134/88 125/63  Pulse: 85 88  Resp: 20 20  Temp: 98.7 F (37.1 C) 98.7 F (37.1 C)  SpO2: 100% 98%    Intake/Output Summary (Last 24 hours) at 06/22/2021 0651 Last data filed at 06/21/2021 2100 Gross per 24 hour  Intake 1240 ml  Output 400 ml  Net 840 ml     Laboratory CBC    Component Value Date/Time   WBC 11.9 (H) 06/20/2021 0233   HGB 8.2 (L) 06/20/2021 0233   HCT 26.3 (L) 06/20/2021 0233   PLT 567 (H) 06/20/2021 0233    BMET    Component Value Date/Time   NA 129 (L) 06/20/2021 0233   K 4.6 06/20/2021 0233   CL 98 06/20/2021 0233   CO2 22 06/20/2021 0233   GLUCOSE 103 (H) 06/20/2021 0233   BUN 19 06/20/2021 0233   CREATININE 0.88 06/20/2021 0233   CALCIUM 9.5 06/20/2021 0233   GFRNONAA >60 06/20/2021 0233    COAG No results found for: INR, PROTIME No results found for: PTT  Antibiotics Anti-infectives (From admission, onward)    Start     Dose/Rate Route Frequency Ordered Stop   06/06/21 0300  ceFAZolin (ANCEF) IVPB 2g/100 mL premix        2 g 200 mL/hr over 30 Minutes Intravenous Every 8 hours 06/05/21 2201 06/06/21 1223   06/05/21 2300  ceFAZolin (ANCEF) IVPB 2g/100 mL premix  Status:  Discontinued        2 g 200 mL/hr over 30 Minutes Intravenous Every 8 hours 06/05/21 2201 06/05/21 2209        V. Charlena Cross, M.D., [facility]  [facility] [place] Office: [phone] Pager:  [phone]

## 2021-06-22 NOTE — H&P (Deleted)
  The note originally documented on this encounter has been moved the the encounter in which it belongs.  

## 2021-06-22 NOTE — Progress Notes (Signed)
Physical Therapy Treatment Patient Details Name: Todd Duran MRN: [id number] DOB: [birth year] Today's Date: 06/22/2021    History of Present Illness Pt is a 75 yo male admitted on 7/29 from CIR due to R lower limb ischemia. Pt underwent R femoral endarterectomy, R iliac & superficial fem art stent, R 5th toe amp. 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 Agrees to PT session to assess transfers and safety with mobility. Patient requires set up assist and supervision to min guard for lateral scoot transfer from recliner back to bed. Good use of UEs for scooting and good balance. Patient will continue to benefit from skilled PT while here to improve independence and strength.      Follow Up Recommendations  CIR     Equipment Recommendations  Wheelchair (measurements PT);Wheelchair cushion (measurements PT);Other (comment)    Recommendations for Other Services Rehab consult     Precautions / Restrictions Precautions Precautions: Fall Precaution Comments: L aka, R BKA (8/14) Other Brace: R darco shoe Restrictions Weight Bearing Restrictions: Yes RLE Weight Bearing: Non weight bearing LLE Weight Bearing: Non weight bearing    Mobility  Bed Mobility Overal bed mobility: Modified Independent Bed Mobility: Sit to Supine     Supine to sit: Modified independent (Device/Increase time);HOB elevated Sit to supine: Modified independent (Device/Increase time)   General bed mobility comments: patient able to reposition in bed well    Transfers Overall transfer level: Needs assistance Equipment used: None Transfers: Lateral/Scoot Transfers          Lateral/Scoot Transfers: Min guard General transfer comment: patient able to scoot from recliner to bed with min guard/supervision. Brought in slide board as  he was going uphill, but he did not need.  Ambulation/Gait                 Stairs             Wheelchair Mobility    Modified Rankin (Stroke Patients Only)       Balance Overall balance assessment: Needs assistance Sitting-balance support: Feet unsupported;No upper extremity supported Sitting balance-Leahy Scale: Good                                      Cognition Arousal/Alertness: Awake/alert Behavior During Therapy: WFL for tasks assessed/performed Overall Cognitive Status: No family/caregiver present to determine baseline cognitive functioning Area of Impairment: Memory                     Memory: Decreased short-term memory Following Commands: Follows one step commands consistently Safety/Judgement: Decreased awareness of safety Awareness: Emergent Problem Solving: Slow processing;Requires verbal cues General Comments: patient pleasant      Exercises      General Comments General comments (skin integrity, edema, etc.): VSS onRA      Pertinent Vitals/Pain Pain Assessment: Faces Pain Score: 8  Faces Pain Scale: Hurts little more Pain Location: R residual limb Pain Descriptors / Indicators: Grimacing;Sore Pain Intervention(s): Patient requesting pain meds-RN notified;Repositioned    Home Living                      Prior Function  PT Goals (current goals can now be found in the care plan section) Acute Rehab PT Goals Patient Stated Goal: rehab and go home PT Goal Formulation: With patient Time For Goal Achievement: 06/26/21 Potential to Achieve Goals: Good Progress towards PT goals: Progressing toward goals    Frequency    Min 3X/week      PT Plan Current plan remains appropriate    Co-evaluation              AM-PAC PT "6 Clicks" Mobility   Outcome Measure  Help needed turning from your back to your side while in a flat bed without using bedrails?: None Help needed moving  from lying on your back to sitting on the side of a flat bed without using bedrails?: None Help needed moving to and from a bed to a chair (including a wheelchair)?: A Little Help needed standing up from a chair using your arms (e.g., wheelchair or bedside chair)?: Total Help needed to walk in hospital room?: Total Help needed climbing 3-5 steps with a railing? : Total 6 Click Score: 14    End of Session   Activity Tolerance: Patient tolerated treatment well Patient left: with bed alarm set;in bed;with nursing/sitter in room Nurse Communication: Mobility status PT Visit Diagnosis: Other abnormalities of gait and mobility (R26.89);Pain Pain - Right/Left: Right Pain - part of body: Leg     Time: 0071-2197 PT Time Calculation (min) (ACUTE ONLY): 10 min  Charges:                       Lissa Merlin, PT, GCS 06/22/21,2:03 PM

## 2021-06-23 ENCOUNTER — Encounter (HOSPITAL_COMMUNITY): Payer: Self-pay | Admitting: Physical Medicine & Rehabilitation

## 2021-06-23 ENCOUNTER — Inpatient Hospital Stay (HOSPITAL_COMMUNITY)
Admission: RE | Admit: 2021-06-23 | Discharge: 2021-06-27 | DRG: 560 | Disposition: A | Discharge status: Other Acute Inpatient Hospital | Payer: No Typology Code available for payment source | Source: Intra-hospital | Attending: Physical Medicine & Rehabilitation | Admitting: Physical Medicine & Rehabilitation

## 2021-06-23 ENCOUNTER — Other Ambulatory Visit: Payer: Self-pay

## 2021-06-23 DIAGNOSIS — K921 Melena: Secondary | ICD-10-CM | POA: Diagnosis not present

## 2021-06-23 DIAGNOSIS — I724 Aneurysm of artery of lower extremity: Secondary | ICD-10-CM | POA: Diagnosis present

## 2021-06-23 DIAGNOSIS — E1142 Type 2 diabetes mellitus with diabetic polyneuropathy: Secondary | ICD-10-CM | POA: Diagnosis present

## 2021-06-23 DIAGNOSIS — I729 Aneurysm of unspecified site: Secondary | ICD-10-CM | POA: Diagnosis not present

## 2021-06-23 DIAGNOSIS — I739 Peripheral vascular disease, unspecified: Secondary | ICD-10-CM

## 2021-06-23 DIAGNOSIS — F17210 Nicotine dependence, cigarettes, uncomplicated: Secondary | ICD-10-CM | POA: Diagnosis present

## 2021-06-23 DIAGNOSIS — R5381 Other malaise: Secondary | ICD-10-CM | POA: Diagnosis present

## 2021-06-23 DIAGNOSIS — T8131XA Disruption of external operation (surgical) wound, not elsewhere classified, initial encounter: Secondary | ICD-10-CM | POA: Diagnosis not present

## 2021-06-23 DIAGNOSIS — Z7982 Long term (current) use of aspirin: Secondary | ICD-10-CM

## 2021-06-23 DIAGNOSIS — Z7984 Long term (current) use of oral hypoglycemic drugs: Secondary | ICD-10-CM

## 2021-06-23 DIAGNOSIS — E785 Hyperlipidemia, unspecified: Secondary | ICD-10-CM | POA: Diagnosis present

## 2021-06-23 DIAGNOSIS — I110 Hypertensive heart disease with heart failure: Secondary | ICD-10-CM | POA: Diagnosis present

## 2021-06-23 DIAGNOSIS — E1165 Type 2 diabetes mellitus with hyperglycemia: Secondary | ICD-10-CM | POA: Diagnosis not present

## 2021-06-23 DIAGNOSIS — Y835 Amputation of limb(s) as the cause of abnormal reaction of the patient, or of later complication, without mention of misadventure at the time of the procedure: Secondary | ICD-10-CM | POA: Diagnosis not present

## 2021-06-23 DIAGNOSIS — R4182 Altered mental status, unspecified: Secondary | ICD-10-CM | POA: Diagnosis not present

## 2021-06-23 DIAGNOSIS — L89152 Pressure ulcer of sacral region, stage 2: Secondary | ICD-10-CM | POA: Diagnosis present

## 2021-06-23 DIAGNOSIS — Z4781 Encounter for orthopedic aftercare following surgical amputation: Secondary | ICD-10-CM | POA: Diagnosis present

## 2021-06-23 DIAGNOSIS — Z89612 Acquired absence of left leg above knee: Secondary | ICD-10-CM | POA: Diagnosis present

## 2021-06-23 DIAGNOSIS — T8149XA Infection following a procedure, other surgical site, initial encounter: Secondary | ICD-10-CM | POA: Diagnosis not present

## 2021-06-23 DIAGNOSIS — G9340 Encephalopathy, unspecified: Secondary | ICD-10-CM | POA: Diagnosis not present

## 2021-06-23 DIAGNOSIS — Z6832 Body mass index (BMI) 32.0-32.9, adult: Secondary | ICD-10-CM | POA: Diagnosis not present

## 2021-06-23 DIAGNOSIS — Z9289 Personal history of other medical treatment: Secondary | ICD-10-CM | POA: Diagnosis not present

## 2021-06-23 DIAGNOSIS — R0600 Dyspnea, unspecified: Secondary | ICD-10-CM | POA: Diagnosis not present

## 2021-06-23 DIAGNOSIS — Z79899 Other long term (current) drug therapy: Secondary | ICD-10-CM

## 2021-06-23 DIAGNOSIS — E1152 Type 2 diabetes mellitus with diabetic peripheral angiopathy with gangrene: Secondary | ICD-10-CM | POA: Diagnosis present

## 2021-06-23 DIAGNOSIS — I10 Essential (primary) hypertension: Secondary | ICD-10-CM | POA: Diagnosis present

## 2021-06-23 DIAGNOSIS — Z89511 Acquired absence of right leg below knee: Secondary | ICD-10-CM

## 2021-06-23 DIAGNOSIS — D62 Acute posthemorrhagic anemia: Secondary | ICD-10-CM | POA: Diagnosis present

## 2021-06-23 DIAGNOSIS — E871 Hypo-osmolality and hyponatremia: Secondary | ICD-10-CM | POA: Diagnosis present

## 2021-06-23 DIAGNOSIS — J9601 Acute respiratory failure with hypoxia: Secondary | ICD-10-CM | POA: Diagnosis not present

## 2021-06-23 DIAGNOSIS — R392 Extrarenal uremia: Secondary | ICD-10-CM | POA: Diagnosis present

## 2021-06-23 DIAGNOSIS — J9602 Acute respiratory failure with hypercapnia: Secondary | ICD-10-CM | POA: Diagnosis not present

## 2021-06-23 DIAGNOSIS — Z66 Do not resuscitate: Secondary | ICD-10-CM | POA: Diagnosis not present

## 2021-06-23 DIAGNOSIS — I5043 Acute on chronic combined systolic (congestive) and diastolic (congestive) heart failure: Secondary | ICD-10-CM | POA: Diagnosis not present

## 2021-06-23 DIAGNOSIS — R627 Adult failure to thrive: Secondary | ICD-10-CM | POA: Diagnosis not present

## 2021-06-23 DIAGNOSIS — I97618 Postprocedural hemorrhage and hematoma of a circulatory system organ or structure following other circulatory system procedure: Secondary | ICD-10-CM | POA: Diagnosis not present

## 2021-06-23 DIAGNOSIS — I70203 Unspecified atherosclerosis of native arteries of extremities, bilateral legs: Secondary | ICD-10-CM | POA: Diagnosis present

## 2021-06-23 DIAGNOSIS — Z20822 Contact with and (suspected) exposure to covid-19: Secondary | ICD-10-CM | POA: Diagnosis present

## 2021-06-23 DIAGNOSIS — D638 Anemia in other chronic diseases classified elsewhere: Secondary | ICD-10-CM | POA: Diagnosis present

## 2021-06-23 DIAGNOSIS — I70263 Atherosclerosis of native arteries of extremities with gangrene, bilateral legs: Secondary | ICD-10-CM | POA: Diagnosis present

## 2021-06-23 DIAGNOSIS — E11649 Type 2 diabetes mellitus with hypoglycemia without coma: Secondary | ICD-10-CM | POA: Diagnosis not present

## 2021-06-23 DIAGNOSIS — J449 Chronic obstructive pulmonary disease, unspecified: Secondary | ICD-10-CM | POA: Diagnosis present

## 2021-06-23 DIAGNOSIS — A498 Other bacterial infections of unspecified site: Secondary | ICD-10-CM | POA: Diagnosis not present

## 2021-06-23 DIAGNOSIS — L7622 Postprocedural hemorrhage and hematoma of skin and subcutaneous tissue following other procedure: Secondary | ICD-10-CM | POA: Diagnosis not present

## 2021-06-23 DIAGNOSIS — E440 Moderate protein-calorie malnutrition: Secondary | ICD-10-CM | POA: Diagnosis not present

## 2021-06-23 DIAGNOSIS — I700 Atherosclerosis of aorta: Secondary | ICD-10-CM | POA: Diagnosis present

## 2021-06-23 DIAGNOSIS — R0602 Shortness of breath: Secondary | ICD-10-CM | POA: Diagnosis not present

## 2021-06-23 DIAGNOSIS — B999 Unspecified infectious disease: Secondary | ICD-10-CM | POA: Diagnosis not present

## 2021-06-23 DIAGNOSIS — I5023 Acute on chronic systolic (congestive) heart failure: Secondary | ICD-10-CM | POA: Diagnosis not present

## 2021-06-23 DIAGNOSIS — Z515 Encounter for palliative care: Secondary | ICD-10-CM | POA: Diagnosis not present

## 2021-06-23 DIAGNOSIS — E1151 Type 2 diabetes mellitus with diabetic peripheral angiopathy without gangrene: Secondary | ICD-10-CM | POA: Diagnosis present

## 2021-06-23 DIAGNOSIS — E119 Type 2 diabetes mellitus without complications: Secondary | ICD-10-CM

## 2021-06-23 DIAGNOSIS — Z7189 Other specified counseling: Secondary | ICD-10-CM | POA: Diagnosis not present

## 2021-06-23 DIAGNOSIS — I5021 Acute systolic (congestive) heart failure: Secondary | ICD-10-CM | POA: Diagnosis not present

## 2021-06-23 DIAGNOSIS — D72829 Elevated white blood cell count, unspecified: Secondary | ICD-10-CM | POA: Diagnosis present

## 2021-06-23 DIAGNOSIS — T8789 Other complications of amputation stump: Secondary | ICD-10-CM | POA: Diagnosis not present

## 2021-06-23 DIAGNOSIS — I21A1 Myocardial infarction type 2: Secondary | ICD-10-CM | POA: Diagnosis not present

## 2021-06-23 DIAGNOSIS — B965 Pseudomonas (aeruginosa) (mallei) (pseudomallei) as the cause of diseases classified elsewhere: Secondary | ICD-10-CM | POA: Diagnosis not present

## 2021-06-23 LAB — CBC
HCT: 20.8 % — ABNORMAL LOW (ref 39.0–52.0)
HCT: 25.8 % — ABNORMAL LOW (ref 39.0–52.0)
Hemoglobin: 6.7 g/dL — CL (ref 13.0–17.0)
Hemoglobin: 8.5 g/dL — ABNORMAL LOW (ref 13.0–17.0)
MCH: 26.9 pg (ref 26.0–34.0)
MCH: 27.2 pg (ref 26.0–34.0)
MCHC: 32.2 g/dL (ref 30.0–36.0)
MCHC: 32.9 g/dL (ref 30.0–36.0)
MCV: 83.5 fL (ref 80.0–100.0)
MCV: 82.7 fL (ref 80.0–100.0)
Platelets: 571 10*3/uL — ABNORMAL HIGH (ref 150–400)
Platelets: 595 10*3/uL — ABNORMAL HIGH (ref 150–400)
RBC: 2.49 MIL/uL — ABNORMAL LOW (ref 4.22–5.81)
RBC: 3.12 MIL/uL — ABNORMAL LOW (ref 4.22–5.81)
RDW: 16.4 % — ABNORMAL HIGH (ref 11.5–15.5)
RDW: 16.8 % — ABNORMAL HIGH (ref 11.5–15.5)
WBC: 10.7 10*3/uL — ABNORMAL HIGH (ref 4.0–10.5)
WBC: 13.1 10*3/uL — ABNORMAL HIGH (ref 4.0–10.5)
nRBC: 0 % (ref 0.0–0.2)
nRBC: 0 % (ref 0.0–0.2)

## 2021-06-23 LAB — GLUCOSE, CAPILLARY
Glucose-Capillary: 115 mg/dL — ABNORMAL HIGH (ref 70–99)
Glucose-Capillary: 121 mg/dL — ABNORMAL HIGH (ref 70–99)
Glucose-Capillary: 104 mg/dL — ABNORMAL HIGH (ref 70–99)
Glucose-Capillary: 121 mg/dL — ABNORMAL HIGH (ref 70–99)

## 2021-06-23 LAB — BASIC METABOLIC PANEL
Anion gap: 8 (ref 5–15)
BUN: 20 mg/dL (ref 8–23)
CO2: 24 mmol/L (ref 22–32)
Calcium: 9.5 mg/dL (ref 8.9–10.3)
Chloride: 99 mmol/L (ref 98–111)
Creatinine, Ser: 0.94 mg/dL (ref 0.61–1.24)
GFR, Estimated: >60 mL/min (ref >60)
Glucose, Bld: 95 mg/dL (ref 70–99)
Potassium: 4.8 mmol/L (ref 3.5–5.1)
Sodium: 131 mmol/L — ABNORMAL LOW (ref 135–145)

## 2021-06-23 LAB — HEMOGLOBIN AND HEMATOCRIT, BLOOD
HCT: 25.4 % — ABNORMAL LOW (ref 39.0–52.0)
Hemoglobin: 8 g/dL — ABNORMAL LOW (ref 13.0–17.0)

## 2021-06-23 LAB — SURGICAL PATHOLOGY

## 2021-06-23 LAB — CREATININE, SERUM
Creatinine, Ser: 0.92 mg/dL (ref 0.61–1.24)
GFR, Estimated: >60 mL/min (ref >60)

## 2021-06-23 LAB — PREPARE RBC (CROSSMATCH)

## 2021-06-23 MED ORDER — ACETAMINOPHEN 325 MG PO TABS
325.0000 mg | ORAL_TABLET | ORAL | Status: DC | PRN
  Filled 2021-06-23: qty 2

## 2021-06-23 MED ORDER — GABAPENTIN 100 MG PO CAPS
100.0000 mg | ORAL_CAPSULE | Freq: Two times a day (BID) | ORAL | Status: DC
  Administered 2021-06-23 – 2021-06-27 (×8): 100 mg via ORAL
  Filled 2021-06-23 (×8): qty 1

## 2021-06-23 MED ORDER — ASPIRIN EC 81 MG PO TBEC
81.0000 mg | DELAYED_RELEASE_TABLET | Freq: Every day | ORAL | Status: DC
  Administered 2021-06-24 – 2021-06-27 (×4): 81 mg via ORAL
  Filled 2021-06-23 (×4): qty 1

## 2021-06-23 MED ORDER — DOCUSATE SODIUM 100 MG PO CAPS
100.0000 mg | ORAL_CAPSULE | Freq: Every day | ORAL | Status: DC
Start: 2021-06-24 — End: 2021-06-27
  Administered 2021-06-24 – 2021-06-27 (×4): 100 mg via ORAL
  Filled 2021-06-23 (×5): qty 1

## 2021-06-23 MED ORDER — OXYBUTYNIN CHLORIDE 5 MG PO TABS
2.5000 mg | ORAL_TABLET | Freq: Two times a day (BID) | ORAL | Status: DC
  Administered 2021-06-23 – 2021-06-27 (×8): 2.5 mg via ORAL
  Filled 2021-06-23 (×8): qty 1

## 2021-06-23 MED ORDER — ATENOLOL 25 MG PO TABS
25.0000 mg | ORAL_TABLET | Freq: Two times a day (BID) | ORAL | Status: DC
  Administered 2021-06-23 – 2021-06-27 (×8): 25 mg via ORAL
  Filled 2021-06-23 (×8): qty 1

## 2021-06-23 MED ORDER — CLOPIDOGREL BISULFATE 75 MG PO TABS: 75.0000 mg | ORAL_TABLET | Freq: Every day | ORAL | 2 refills | Status: AC

## 2021-06-23 MED ORDER — SODIUM CHLORIDE 0.9% IV SOLUTION: Freq: Once | INTRAVENOUS | Status: AC

## 2021-06-23 MED ORDER — CLOPIDOGREL BISULFATE 75 MG PO TABS
75.0000 mg | ORAL_TABLET | Freq: Every day | ORAL | Status: DC
  Administered 2021-06-24 – 2021-06-27 (×4): 75 mg via ORAL
  Filled 2021-06-23 (×4): qty 1

## 2021-06-23 MED ORDER — ADULT MULTIVITAMIN W/MINERALS CH
1.0000 | ORAL_TABLET | Freq: Every day | ORAL | Status: DC
  Administered 2021-06-24 – 2021-06-27 (×4): 1 via ORAL
  Filled 2021-06-23 (×4): qty 1

## 2021-06-23 MED ORDER — AMLODIPINE BESYLATE 10 MG PO TABS
10.0000 mg | ORAL_TABLET | Freq: Every day | ORAL | Status: DC
  Administered 2021-06-24 – 2021-06-27 (×4): 10 mg via ORAL
  Filled 2021-06-23 (×4): qty 1

## 2021-06-23 MED ORDER — POLYETHYLENE GLYCOL 3350 17 G PO PACK
17.0000 g | PACK | Freq: Every day | ORAL | Status: DC | PRN
  Administered 2021-06-23: 17 g via ORAL
  Filled 2021-06-23: qty 1

## 2021-06-23 MED ORDER — OXYCODONE-ACETAMINOPHEN 5-325 MG PO TABS
1.0000 | ORAL_TABLET | ORAL | Status: DC | PRN
  Administered 2021-06-23 – 2021-06-26 (×7): 2 via ORAL
  Administered 2021-06-26: 1 via ORAL
  Administered 2021-06-27 (×2): 2 via ORAL
  Filled 2021-06-23: qty 2
  Filled 2021-06-23: qty 1
  Filled 2021-06-23 (×9): qty 2

## 2021-06-23 MED ORDER — FERROUS SULFATE 325 (65 FE) MG PO TABS
325.0000 mg | ORAL_TABLET | Freq: Every day | ORAL | Status: DC
  Administered 2021-06-24 – 2021-06-27 (×4): 325 mg via ORAL
  Filled 2021-06-23 (×4): qty 1

## 2021-06-23 MED ORDER — FOLIC ACID 1 MG PO TABS
1.0000 mg | ORAL_TABLET | Freq: Every day | ORAL | Status: DC
  Administered 2021-06-24 – 2021-06-27 (×4): 1 mg via ORAL
  Filled 2021-06-23 (×5): qty 1

## 2021-06-23 MED ORDER — HEPARIN SODIUM (PORCINE) 5000 UNIT/ML IJ SOLN: 5000.0000 [IU] | Freq: Three times a day (TID) | INTRAMUSCULAR | Status: DC

## 2021-06-23 MED ORDER — PANTOPRAZOLE SODIUM 40 MG PO TBEC
40.0000 mg | DELAYED_RELEASE_TABLET | Freq: Every day | ORAL | Status: DC
  Administered 2021-06-24 – 2021-06-27 (×4): 40 mg via ORAL
  Filled 2021-06-23 (×4): qty 1

## 2021-06-23 MED ORDER — METFORMIN HCL 500 MG PO TABS
500.0000 mg | ORAL_TABLET | Freq: Every day | ORAL | Status: DC
  Administered 2021-06-24 – 2021-06-27 (×4): 500 mg via ORAL
  Filled 2021-06-23 (×4): qty 1

## 2021-06-23 MED ORDER — ACETAMINOPHEN 650 MG RE SUPP: 325.0000 mg | RECTAL | Status: DC | PRN

## 2021-06-23 MED ORDER — INSULIN ASPART 100 UNIT/ML IJ SOLN
0.0000 [IU] | Freq: Three times a day (TID) | INTRAMUSCULAR | Status: DC
  Administered 2021-06-24 – 2021-06-25 (×2): 2 [IU] via SUBCUTANEOUS
  Administered 2021-06-26 (×2): 3 [IU] via SUBCUTANEOUS
  Administered 2021-06-26 – 2021-06-27 (×2): 2 [IU] via SUBCUTANEOUS
  Administered 2021-06-27 (×2): 3 [IU] via SUBCUTANEOUS

## 2021-06-23 MED ORDER — ATORVASTATIN CALCIUM 40 MG PO TABS
40.0000 mg | ORAL_TABLET | Freq: Every day | ORAL | Status: DC
  Administered 2021-06-23 – 2021-06-26 (×4): 40 mg via ORAL
  Filled 2021-06-23 (×4): qty 1

## 2021-06-23 MED ORDER — HEPARIN SODIUM (PORCINE) 5000 UNIT/ML IJ SOLN
5000.0000 [IU] | Freq: Three times a day (TID) | INTRAMUSCULAR | Status: DC
  Administered 2021-06-23 – 2021-06-24 (×3): 5000 [IU] via SUBCUTANEOUS
  Filled 2021-06-23 (×3): qty 1

## 2021-06-23 NOTE — Progress Notes (Addendum)
Inpatient Rehabilitation Medication Review by a Pharmacist  A complete drug regimen review was completed for this patient to identify any potential clinically significant medication issues.  High Risk Drug Classes Is patient taking? Indication by Medication  Antipsychotic No   Anticoagulant Yes Heparin dvt prophylaxis  Antibiotic No   Opioid Yes Percocet for pain control  Antiplatelet Yes Plavix and asa for placement of right iliac artery stent  Hypoglycemics/insulin Yes SSI and metformin, blood glucose control  Vasoactive Medication No   Chemotherapy No   Other No      Type of Medication Issue Identified Description of Issue Recommendation(s)  Drug Interaction(s) (clinically significant)     Duplicate Therapy     Allergy     No Medication Administration End Date     Incorrect Dose     Additional Drug Therapy Needed     Significant med changes from prior encounter (inform family/care partners about these prior to discharge).    Other       Clinically significant medication issues were identified that warrant physician communication and completion of prescribed/recommended actions by midnight of the next day:  No  Name of provider notified for urgent issues identified:   Provider Method of Notification:     Pharmacist comments:   Time spent performing this drug regimen review (minutes):  20   Todd Duran 06/23/2021 6:32 PM

## 2021-06-23 NOTE — Progress Notes (Signed)
  Progress Note    06/23/2021 8:01 AM 2 Days Post-Op  Subjective: soreness in right BKA   Vitals:   06/22/21 2305 06/23/21 0355  BP: 113/73 119/72  Pulse: 79   Resp: 17 17  Temp: 98.3 F (36.8 C) 98.5 F (36.9 C)  SpO2: 100% 100%   Physical Exam: Cardiac:  regular Lungs:  non labored Incisions:  right BKA stump well appearing, staples intact, clean and dry. Flaps viable   Extremities: left AKA healed Abdomen:  flat, soft, non tender Neurologic: alert and oriented  CBC    Component Value Date/Time   WBC 10.7 (H) 06/23/2021 0103   RBC 2.49 (L) 06/23/2021 0103   HGB 6.7 (LL) 06/23/2021 0103   HCT 20.8 (L) 06/23/2021 0103   PLT 571 (H) 06/23/2021 0103   MCV 83.5 06/23/2021 0103   MCH 26.9 06/23/2021 0103   MCHC 32.2 06/23/2021 0103   RDW 16.4 (H) 06/23/2021 0103   LYMPHSABS 1.2 05/26/2021 0443   MONOABS 0.4 05/26/2021 0443   EOSABS 0.1 05/26/2021 0443   BASOSABS 0.0 05/26/2021 0443    BMET    Component Value Date/Time   NA 131 (L) 06/23/2021 0103   K 4.8 06/23/2021 0103   CL 99 06/23/2021 0103   CO2 24 06/23/2021 0103   GLUCOSE 95 06/23/2021 0103   BUN 20 06/23/2021 0103   CREATININE 0.94 06/23/2021 0103   CALCIUM 9.5 06/23/2021 0103   GFRNONAA >60 06/23/2021 0103    INR No results found for: INR   Intake/Output Summary (Last 24 hours) at 06/23/2021 0801 Last data filed at 06/23/2021 0537 Gross per 24 hour  Intake --  Output 1130 ml  Net -1130 ml     Assessment/Plan:  75 y.o. male is s/p right BKA 2 Days Post-Op   Right BKA dressings changed. Dry and intact. Viable flaps Left AKA well appearing Continue Plavix, Aspirin, Statin Hgb 6.7 this morning. I unit PRBC ordered Will follow up on labs PT/ OT recommending CIR   Graceann Congress, PA-C [facility] [phone] 06/23/2021 8:01 AM

## 2021-06-23 NOTE — Progress Notes (Signed)
   06/23/21 0214  Provider Notification  Provider Name/Title Chestine Spore MD  Date Provider Notified 06/23/21  Time Provider Notified 0207  Notification Type Page  Notification Reason Critical result  Test performed and critical result hgb 6.7  Date Critical Result Received 06/23/21  Time Critical Result Received 0205  Provider response See new orders (1 unit RBC)  Date of Provider Response 06/23/21  Time of Provider Response 0209   AM hgb 6.7, received verbal order from [facility] on-call Clark MD to transfuse 1 unit of PRBCs.

## 2021-06-23 NOTE — H&P (Signed)
Signed      Expand All Collapse All                                                                                                                                                                                                                             Physical Medicine and Rehabilitation Admission H&P       HPI: Todd Duran is a 75 year old right-handed male history of diabetes mellitus, hypertension ,tobacco and alcohol use.  Lives with spouse independent prior to admission.  Spouse works during the day.  Presented 05/13/2021 with progressive left foot pain x2 months with ischemic changes and findings of decreased femoral pulses bilaterally.  Underwent ultrasound-guided left femoral artery with abdominal aortogram showing bilateral renal artery stenosis.  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 Myra Gianotti.  [facility] course anemia as well as hyponatremia 130.  He was cleared to begin subcutaneous heparin for DVT prophylaxis.  Therapy evaluations completed and he was admitted to inpatient rehab services 05/15/2021 with slow progressive gains.  Close monitoring of BKA site noted to be boggy necrotic area concerning along incision line with vascular surgery notified for follow-up.  A few the patient staples were removed noted underlying abscess with poor healing and noted increased necrosis.  He was discharged to acute care services 05/20/2021 and underwent left AKA per Dr. Chestine Spore.  Postoperative course anemia transfused 2 units packed red blood cells.  He was readmitted back to inpatient rehab services 05/25/2021 he was doing well after his left AKA however noted ischemic changes to the right fifth toe with ulceration and felt that amputation was imminent as well as need for revascularization thus he was discharged back to acute care  services 06/05/2021 and underwent right iliofemoral enterectomy with bovine patch angioplasty as well as right fifth toe amputation including metatarsal head per Dr. Myra Gianotti..  He was nonweightbearing to left lower extremity and weightbearing as tolerated right lower extremity with Darco shoe boot.  Patient with persistent ischemic changes on the dorsum of the right foot following his bypass and underwent redo right femoral artery exposure right common femoral to above-knee popliteal artery bypass graft with ipsilateral and on reverse greater saphenous vein as well as right fourth and fifth toe amputation including metatarsal head 06/10/2021 unfortunately his foot remained significantly ischemic with foul odor and necrotic tissue and underwent  right BKA 06/21/2021 per Dr. Myra Gianotti..  He has been cleared to begin subcutaneous heparin for DVT prophylaxis.  Acute blood loss anemia and monitored.  Therapy evaluations again completed and patient was readmitted back to inpatient rehab services to resume inpatient comprehensive therapies   Review of Systems  Constitutional:  Positive for fever. Negative for chills.  HENT:  Negative for hearing loss.   Eyes:  Negative for blurred vision and double vision.  Respiratory:  Negative for cough.        Shortness of breath with exertion  Cardiovascular:  Positive for leg swelling. Negative for chest pain and palpitations.  Gastrointestinal:  Positive for constipation. Negative for heartburn, nausea and vomiting.  Genitourinary:  Negative for dysuria and flank pain.  Musculoskeletal:  Positive for myalgias.  Skin:  Negative for rash.  All other systems reviewed and are negative.     Past Medical History:  Diagnosis Date   Diabetes mellitus without complication (HCC)     Hypertension           Past Surgical History:  Procedure Laterality Date   ABDOMINAL AORTOGRAM W/LOWER EXTREMITY N/A 05/12/2021    Procedure: ABDOMINAL AORTOGRAM W/LOWER EXTREMITY;  Surgeon:  Nada Libman, MD;  Location: [facility] INVAS[facility];  Service: Cardiovascular;  Laterality: N/A;   AMPUTATION Left 05/13/2021    Procedure: AMPUTATION BELOW KNEE LEFT ;  Surgeon: Nada Libman, MD;  Location: [facility] OR;  Service: Vascular;  Laterality: Left;   AMPUTATION Left 05/20/2021    Procedure: AMPUTATION ABOVE KNEE;  Surgeon: Cephus Shelling, MD;  Location: [facility] OR;  Service: Vascular;  Laterality: Left;   AMPUTATION Right 06/05/2021    Procedure: RIGHT FIFTH TOE AMPUTATION;  Surgeon: Nada Libman, MD;  Location: [facility] OR;  Service: Vascular;  Laterality: Right;   AMPUTATION Right 06/10/2021    Procedure: RIGHT THIRD AND  FOURTH TOE AMPUTATION;  Surgeon: Nada Libman, MD;  Location: [facility] OR;  Service: Vascular;  Laterality: Right;   AMPUTATION Right 06/21/2021    Procedure: RIGHT BELOW KNEE AMPUTATION;  Surgeon: Nada Libman, MD;  Location: [facility] OR;  Service: Vascular;  Laterality: Right;   ENDARTERECTOMY FEMORAL Right 06/05/2021    Procedure: RIGHT FEMORAL ENDARTERECTOMY;  Surgeon: Nada Libman, MD;  Location: [facility] OR;  Service: Vascular;  Laterality: Right;   FEMORAL-POPLITEAL BYPASS GRAFT Right 06/10/2021    Procedure: RIGHT FEMORAL TO ABOVE KNEE POPLITEAL ARTERY BYPASS GRAFTING USING RIGHT NON REVERSED GREATER SAPHENOUS VEIN;  Surgeon: Nada Libman, MD;  Location: [facility] OR;  Service: Vascular;  Laterality: Right;   HERNIA REPAIR       INSERTION OF ILIAC STENT Right 06/05/2021    Procedure: INSERTION OF RIGHT ILIAC AND SUPERFICIAL FEMORAL ARTERY STENT;  Surgeon: Nada Libman, MD;  Location: [facility] OR;  Service: Vascular;  Laterality: Right;   KNEE SURGERY       PATCH ANGIOPLASTY Right 06/05/2021    Procedure: PATCH ANGIOPLASTY OF RIGHT FEMORAL ARTERY USING XENOSURE BIOLOGIC PATCH;  Surgeon: Nada Libman, MD;  Location: [facility] OR;  Service: Vascular;  Laterality: Right;   VEIN HARVEST Right 06/10/2021    Procedure: VEIN HARVEST OF RIGHT GREATER SAPHENOUS VEIN;  Surgeon: Nada Libman,  MD;  Location: [facility] OR;  Service: Vascular;  Laterality: Right;    History reviewed. No pertinent family history. Social History:  reports that he has been smoking cigarettes. He has been smoking an average of .5 packs per day. He has never used smokeless  tobacco. He reports current alcohol use. He reports that he does not use drugs. Allergies:       Allergies  Allergen Reactions   Lisinopril Swelling      Swelling of lips          Medications Prior to Admission  Medication Sig Dispense Refill   amLODipine (NORVASC) 10 MG tablet Take 10 mg by mouth daily.       aspirin 81 MG EC tablet Take 1 tablet (81 mg total) by mouth daily. Swallow whole. 30 tablet 11   atenolol (TENORMIN) 25 MG tablet Take 1 tablet (25 mg total) by mouth 2 (two) times daily. (Patient taking differently: Take 100 mg by mouth daily.)       atorvastatin (LIPITOR) 20 MG tablet Take 20 mg by mouth at bedtime.       cyanocobalamin 1000 MCG tablet Take 1,000 mcg by mouth daily.       gabapentin (NEURONTIN) 100 MG capsule Take 1 capsule (100 mg total) by mouth 2 (two) times daily. (Patient taking differently: Take 100 mg by mouth 3 (three) times daily.)       hydrochlorothiazide (HYDRODIURIL) 25 MG tablet Take 25 mg by mouth daily.       metFORMIN (GLUCOPHAGE) 500 MG tablet Take 500 mg by mouth daily with breakfast.       methocarbamol (ROBAXIN) 750 MG tablet Take 750 mg by mouth 4 (four) times daily.       oxybutynin (DITROPAN) 5 MG tablet Take 0.5 tablets (2.5 mg total) by mouth 2 (two) times daily.       diclofenac Sodium (VOLTAREN) 1 % GEL Apply 2 g topically 3 (three) times daily.       ferrous sulfate 325 (65 FE) MG tablet Take 1 tablet (325 mg total) by mouth daily with breakfast.   3   folic acid (FOLVITE) 1 MG tablet Take 1 tablet (1 mg total) by mouth daily.       Multiple Vitamin (MULTIVITAMIN WITH MINERALS) TABS tablet Take 1 tablet by mouth daily.       oxyCODONE-acetaminophen (PERCOCET/ROXICET) 5-325 MG tablet  Take 1-2 tablets by mouth every 4 (four) hours as needed for moderate pain. 30 tablet 0   polyethylene glycol (MIRALAX / GLYCOLAX) 17 g packet Take 17 g by mouth daily as needed for mild constipation. 14 each 0      Drug Regimen Review Drug regimen was reviewed and remains appropriate with no significant issues identified   Home: Home Living Family/patient expects to be discharged to:: Private residence Living Arrangements: Spouse/significant other Available Help at Discharge: Available 24 hours/day Type of Home: House Home Access: Stairs to enter [employer] of Steps: 4 Entrance Stairs-Rails: Right, Left Home Layout: Multi-level Alternate Level Stairs-Number of Steps: Reports his bed/bath in downstairs, Bathroom Shower/Tub: Engineer, manufacturing systems: Standard Bathroom Accessibility: Yes Home Equipment: None Additional Comments: pt was d/c'd from CIR to have surgery, plan is to return. Pt lives with spouse typically who can provide 24/7 assist.  Lives With: Spouse, Family   Functional History: Prior Function Level of Independence: Needs assistance Gait / Transfers Assistance Needed: pt doing lateral scoot and std pvt transfers in CIR ADL's / Homemaking Assistance Needed: working with OT in CIR Comments: Prior to initial BKA, pt was independent in all tasks   Functional Status:  Mobility: Bed Mobility Overal bed mobility: Modified Independent Bed Mobility: Sit to Supine Supine to sit: Modified independent (Device/Increase time), HOB elevated Sit to supine: Modified  independent (Device/Increase time) General bed mobility comments: patient able to reposition in bed well Transfers Overall transfer level: Needs assistance Equipment used: None Transfers: Lateral/Scoot Transfers Sit to Stand: Min assist Anterior-Posterior transfers: Min assist, +2 safety/equipment, From elevated surface  Lateral/Scoot Transfers: Min guard General transfer comment: patient able  to scoot from recliner to bed with min guard/supervision. Brought in slide board as he was going uphill, but he did not need. Ambulation/Gait General Gait Details: unable this date Wheelchair Mobility Wheelchair mobility: Yes Wheelchair propulsion: Both upper extremities Wheelchair parts: Supervision/cueing Distance: 400 Wheelchair Assistance Details (indicate cue type and reason): vcs for use of brakes, pt propelled chair and performs turns 180 degrees with smooth technique, min cues for environmental awareness/safety   ADL: ADL Overall ADL's : Needs assistance/impaired Eating/Feeding: Set up, Sitting Grooming: Set up, Sitting, Oral care Grooming Details (indicate cue type and reason): seated in recliner, able to bend forward and reach appropriately Upper Body Bathing: Sitting, Min guard Lower Body Bathing: Sitting/lateral leans, Maximal assistance Lower Body Bathing Details (indicate cue type and reason): unable to access LLE foot at this time, can access down to knee Upper Body Dressing : Set up, Sitting Lower Body Dressing: Sitting/lateral leans, Maximal assistance Lower Body Dressing Details (indicate cue type and reason): unable to access LLE foot at this time, can access down to knee Toilet Transfer: Moderate assistance, Stand-pivot, Cueing for safety, Cueing for sequencing, BSC, RW Toilet Transfer Details (indicate cue type and reason): to recliner, bed height elevated and mod A to power up to standing. patient able to pivot and take small hop on R foot however poor eccentric control into chair needing heavy mod A to safely sit into recliner Toileting- Clothing Manipulation and Hygiene: Maximal assistance, Sitting/lateral lean Functional mobility during ADLs: Moderate assistance, Cueing for safety, Cueing for sequencing, Rolling walker General ADL Comments: Discussed BSC transfers practice with pt in next session. able able to demo lateral leans well, likely requiring mostly Min A  for LB ADls   Cognition: Cognition Overall Cognitive Status: No family/caregiver present to determine baseline cognitive functioning Orientation Level: Oriented X4 Cognition Arousal/Alertness: Awake/alert Behavior During Therapy: WFL for tasks assessed/performed Overall Cognitive Status: No family/caregiver present to determine baseline cognitive functioning Area of Impairment: Memory Memory: Decreased short-term memory Following Commands: Follows one step commands consistently Safety/Judgement: Decreased awareness of safety Awareness: Emergent Problem Solving: Slow processing, Requires verbal cues General Comments: patient pleasant   Physical Exam: Blood pressure 126/78, pulse 87, temperature 98.5 F (36.9 C), temperature source Oral, resp. rate 13, height  (1.803 m), weight 56.5 kg, SpO2 100 %. Physical Exam Constitutional:      Appearance: He is ill-appearing.  HENT:     Right Ear: External ear normal.     Left Ear: External ear normal.     Mouth/Throat:     Mouth: Mucous membranes are moist.  Eyes:     Extraocular Movements: Extraocular movements intact.     Pupils: Pupils are equal, round, and reactive to light.  Cardiovascular:     Rate and Rhythm: Normal rate and regular rhythm.     Heart sounds: No murmur heard. Pulmonary:     Effort: Pulmonary effort is normal. No respiratory distress.     Breath sounds: No wheezing.  Abdominal:     General: Bowel sounds are normal.     Palpations: Abdomen is soft.  Skin:    General: Skin is warm.     Comments: Left AKA is well-healed.  Right  BKA with limb guard protector in place appropriately tender. Minimal drainage from well-approximated incision  Neurological:     Mental Status: He is alert and oriented to person, place, and time.     Cranial Nerves: No cranial nerve deficit.     Sensory: No sensory deficit.     Comments: Patient is alert.  Sitting up in bed.  Pink eye contact with examiner.  Follows commands and  oriented x3. Motor 5/5 UE. RLE 3-4/5 and limited somewhat by pain. LLE 4/5 HF. No focal sensory deficits  Psychiatric:        Mood and Affect: Mood normal.        Behavior: Behavior normal.      Lab Results Last 48 Hours        Results for orders placed or performed during the hospital encounter of 06/05/21 (from the past 48 hour(s))  Glucose, capillary     Status: Abnormal    Collection Time: 06/20/21  5:33 PM  Result Value Ref Range    Glucose-Capillary 118 (H) 70 - 99 mg/dL      Comment: Glucose reference range applies only to samples taken after fasting for at least 8 hours.  Glucose, capillary     Status: Abnormal    Collection Time: 06/20/21  9:15 PM  Result Value Ref Range    Glucose-Capillary 134 (H) 70 - 99 mg/dL      Comment: Glucose reference range applies only to samples taken after fasting for at least 8 hours.  Glucose, capillary     Status: Abnormal    Collection Time: 06/21/21  6:11 AM  Result Value Ref Range    Glucose-Capillary 110 (H) 70 - 99 mg/dL      Comment: Glucose reference range applies only to samples taken after fasting for at least 8 hours.  Glucose, capillary     Status: Abnormal    Collection Time: 06/21/21  9:30 AM  Result Value Ref Range    Glucose-Capillary 121 (H) 70 - 99 mg/dL      Comment: Glucose reference range applies only to samples taken after fasting for at least 8 hours.  Glucose, capillary     Status: Abnormal    Collection Time: 06/21/21 11:46 AM  Result Value Ref Range    Glucose-Capillary 146 (H) 70 - 99 mg/dL      Comment: Glucose reference range applies only to samples taken after fasting for at least 8 hours.  Glucose, capillary     Status: Abnormal    Collection Time: 06/21/21  4:57 PM  Result Value Ref Range    Glucose-Capillary 284 (H) 70 - 99 mg/dL      Comment: Glucose reference range applies only to samples taken after fasting for at least 8 hours.  Glucose, capillary     Status: Abnormal    Collection Time: 06/21/21   9:05 PM  Result Value Ref Range    Glucose-Capillary 166 (H) 70 - 99 mg/dL      Comment: Glucose reference range applies only to samples taken after fasting for at least 8 hours.  Glucose, capillary     Status: Abnormal    Collection Time: 06/22/21  6:16 AM  Result Value Ref Range    Glucose-Capillary 141 (H) 70 - 99 mg/dL      Comment: Glucose reference range applies only to samples taken after fasting for at least 8 hours.  Glucose, capillary     Status: Abnormal    Collection Time: 06/22/21  7:34  AM  Result Value Ref Range    Glucose-Capillary 128 (H) 70 - 99 mg/dL      Comment: Glucose reference range applies only to samples taken after fasting for at least 8 hours.  Glucose, capillary     Status: Abnormal    Collection Time: 06/22/21 11:31 AM  Result Value Ref Range    Glucose-Capillary 196 (H) 70 - 99 mg/dL      Comment: Glucose reference range applies only to samples taken after fasting for at least 8 hours.      Imaging Results (Last 48 hours)  No results found.           Medical Problem List and Plan: 1.  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  Antithrombotics: -DVT/anticoagulation: Subcutaneous heparin Pharmaceutical: Heparin             -antiplatelet therapy: Aspirin 81 mg daily and Plavix 75 mg daily 3. Pain Management: Neurontin 100 mg twice daily, oxycodone as needed 4. Mood: Provide emotional support             -antipsychotic agents: N/A 5. Neuropsych: This patient is capable of making decisions on his own behalf. 6. Skin/Wound Care: Routine skin checks             -shrinker LLE             -limb guard RLE with dressing 7. Fluids/Electrolytes/Nutrition: Routine in and outs with follow-up chemistries 8.  Acute blood loss anemia.  Continue iron supplement.   Follow-up CBC 9.  Diabetes mellitus with peripheral neuropathy.  Hemoglobin A1c 5.2.  Glucophage 500 mg daily 10.  Hypertension.  Norvasc 10 mg daily, Tenormin 25 mg twice daily.  Monitor with increased mobility 11.  Hyperlipidemia.  Lipitor 12.  History of alcohol tobacco use.  Provide counseling     Charlton Amor, PA-C 06/22/2021   I have personally performed a face to face diagnostic evaluation of this patient and formulated the key components of the plan.  Additionally, I have personally reviewed laboratory data, imaging studies, as well as relevant notes and concur with the physician assistant's documentation above.  The patient's status has not changed from the original H&P.  Any changes in documentation from the acute care chart have been noted above.  Ranelle Oyster, MD, [place] [facility]

## 2021-06-23 NOTE — Discharge Summary (Signed)
Discharge Summary  Patient ID: Todd Duran [id number] 75 y.o. [birth year]  Admit date: 06/05/2021  Discharge date and time: 06/23/21   Admitting Physician: Chuck Hint, MD   Discharge Physician: Dr. 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  While in inpatient rehab he developed ischemic changes to the right foot.  He was readmitted to inpatient status and on 06/05/2021 underwent right iliofemoral endarterectomy with bovine patch angioplasty and right iliac artery stenting with fifth toe amputation by Dr. Myra Gianotti on 06/05/2021.  Despite the above procedure the right foot continue to undergo tissue necrosis thus he was brought back to the operating room on 06/10/2021 and underwent right common femoral to above-the-knee popliteal bypass with vein and fourth and fifth toe amputation.  He maintained a patent bypass given he had a palpable popliteal pulse postoperatively.  Despite a patent bypass tissue loss of right foot was too extensive for limb salvage.  He then underwent right below the knee amputation on 06/21/2021.  Postoperatively incision appears to be healing well.  Based on therapy recommendations and insurance authorization he was discharged back to inpatient rehabilitation.  He will follow-up in about 4 to 6 weeks for staple removal of right below the knee amputation.  He will likely have left above-the-knee amputation staples removed while in rehabilitation.  He will need to continue aspirin and Plavix due to placement  of right iliac artery stent at least until his right below the knee amputation heals in about 4 to 6 weeks.  He will be discharged this afternoon to CIR in stable condition.  Consults: None  Treatments: surgery: Right iliofemoral endarterectomy with bovine patch angioplasty with right iliac stenting and right fifth toe amputation by Dr. Myra Gianotti 06/05/2021  Right common femoral to above-the-knee popliteal bypass with vein and fourth and fifth toe amputation by Dr. Myra Gianotti on 06/10/2021  Right below the knee amputation by Dr. Myra Gianotti on 06/21/2021  Discharge Exam: See progress note 06/23/21 Vitals:   06/23/21 1411 06/23/21 1540  BP: 115/72 110/69  Pulse: 84 86  Resp: 11 13  Temp: 97.8 F (36.6 C) 97.9 F (36.6 C)  SpO2: 98% 99%     Disposition: Discharge disposition: 90-DC/txfr to inpt rehab facility with planned acute care hosp IP admission       Patient Instructions:  Allergies as of 06/23/2021       Reactions   Lisinopril Swelling   Swelling of lips        Medication List     TAKE these medications    amLODipine 10 MG tablet Commonly known as: NORVASC Take 10 mg by mouth daily.   aspirin 81 MG EC tablet Take 1 tablet (81 mg total) by mouth daily. Swallow whole.   atorvastatin 20 MG tablet Commonly known as: LIPITOR Take 20 mg by mouth at bedtime.   clopidogrel 75 MG tablet Commonly known as: PLAVIX Take 1 tablet (75 mg total) by mouth daily. Start taking on: June 24, 2021   cyanocobalamin 1000 MCG tablet Take 1,000 mcg by mouth daily.   diclofenac Sodium 1 % Gel Commonly known as: VOLTAREN Apply 2 g topically  3 (three) times daily.   ferrous sulfate 325 (65 FE) MG tablet Take 1 tablet (325 mg total) by mouth daily with breakfast.   folic acid 1 MG tablet Commonly known as: FOLVITE Take 1 tablet (1 mg total) by mouth daily.   hydrochlorothiazide 25 MG tablet Commonly known as: HYDRODIURIL Take 25 mg by mouth daily.   metFORMIN 500 MG  tablet Commonly known as: GLUCOPHAGE Take 500 mg by mouth daily with breakfast.   methocarbamol 750 MG tablet Commonly known as: ROBAXIN Take 750 mg by mouth 4 (four) times daily.   multivitamin with minerals Tabs tablet Take 1 tablet by mouth daily.   oxybutynin 5 MG tablet Commonly known as: DITROPAN Take 0.5 tablets (2.5 mg total) by mouth 2 (two) times daily.   oxyCODONE-acetaminophen 5-325 MG tablet Commonly known as: PERCOCET/ROXICET Take 1-2 tablets by mouth every 4 (four) hours as needed for moderate pain.   polyethylene glycol 17 g packet Commonly known as: MIRALAX / GLYCOLAX Take 17 g by mouth daily as needed for mild constipation.       ASK your doctor about these medications    atenolol 25 MG tablet Commonly known as: TENORMIN Take 1 tablet (25 mg total) by mouth 2 (two) times daily.   gabapentin 100 MG capsule Commonly known as: NEURONTIN Take 1 capsule (100 mg total) by mouth 2 (two) times daily.       Activity: activity as tolerated Diet: regular diet Wound Care: keep wound clean and dry  Follow-up with [facility] in 4 weeks.  Signed: Emilie Rutter, PA-C 06/23/2021 4:05 PM [facility] Office: [phone]

## 2021-06-23 NOTE — Progress Notes (Addendum)
Pt arrived to unit via bed, pt is alert and oriented, able to make needs known, no c/o pain. Oriented to rehab.   Pt has healed area to sacrum, applied foam for protection

## 2021-06-23 NOTE — Progress Notes (Addendum)
Inpatient Rehabilitation Admissions Coordinator   CIR bed is available . I contacted Dr Trula Slade and he is aware an in agreement. I will alert acute team , TOC and make the arrangements to admit today. I met with patient and his wife at bedside and they are in agreement to admit.  Danne Baxter, RN, MSN Rehab Admissions Coordinator [phone] 06/23/2021 3:46 PM

## 2021-06-23 NOTE — Progress Notes (Signed)
Inpatient Rehabilitation Admissions Coordinator   I have [facility] insurance approval for CIR. I await bed availability either late today or tomorrow. I have alerted acute team and TOC.  Ottie Glazier, RN, MSN Rehab Admissions Coordinator [phone] 06/23/2021 10:30 AM

## 2021-06-24 ENCOUNTER — Encounter (HOSPITAL_COMMUNITY): Payer: Self-pay | Admitting: Surgery

## 2021-06-24 LAB — COMPREHENSIVE METABOLIC PANEL
ALT: 12 U/L (ref 0–44)
AST: 14 U/L — ABNORMAL LOW (ref 15–41)
Albumin: 1.8 g/dL — ABNORMAL LOW (ref 3.5–5.0)
Alkaline Phosphatase: 190 U/L — ABNORMAL HIGH (ref 38–126)
Anion gap: 8 (ref 5–15)
BUN: 21 mg/dL (ref 8–23)
CO2: 23 mmol/L (ref 22–32)
Calcium: 9.4 mg/dL (ref 8.9–10.3)
Chloride: 99 mmol/L (ref 98–111)
Creatinine, Ser: 0.97 mg/dL (ref 0.61–1.24)
GFR, Estimated: >60 mL/min (ref >60)
Glucose, Bld: 122 mg/dL — ABNORMAL HIGH (ref 70–99)
Potassium: 4.3 mmol/L (ref 3.5–5.1)
Sodium: 130 mmol/L — ABNORMAL LOW (ref 135–145)
Total Bilirubin: 0.7 mg/dL (ref 0.3–1.2)
Total Protein: 6.5 g/dL (ref 6.5–8.1)

## 2021-06-24 LAB — CBC WITH DIFFERENTIAL/PLATELET
Abs Immature Granulocytes: 0.22 10*3/uL — ABNORMAL HIGH (ref 0.00–0.07)
Basophils Absolute: 0.1 10*3/uL (ref 0.0–0.1)
Basophils Relative: 0 %
Eosinophils Absolute: 0.2 10*3/uL (ref 0.0–0.5)
Eosinophils Relative: 2 %
HCT: 25.3 % — ABNORMAL LOW (ref 39.0–52.0)
Hemoglobin: 7.9 g/dL — ABNORMAL LOW (ref 13.0–17.0)
Immature Granulocytes: 2 %
Lymphocytes Relative: 17 %
Lymphs Abs: 1.9 10*3/uL (ref 0.7–4.0)
MCH: 26.3 pg (ref 26.0–34.0)
MCHC: 31.2 g/dL (ref 30.0–36.0)
MCV: 84.3 fL (ref 80.0–100.0)
Monocytes Absolute: 0.6 10*3/uL (ref 0.1–1.0)
Monocytes Relative: 5 %
Neutro Abs: 8.3 10*3/uL — ABNORMAL HIGH (ref 1.7–7.7)
Neutrophils Relative %: 74 %
Platelets: 604 10*3/uL — ABNORMAL HIGH (ref 150–400)
RBC: 3 MIL/uL — ABNORMAL LOW (ref 4.22–5.81)
RDW: 17 % — ABNORMAL HIGH (ref 11.5–15.5)
WBC: 11.2 10*3/uL — ABNORMAL HIGH (ref 4.0–10.5)
nRBC: 0 % (ref 0.0–0.2)

## 2021-06-24 LAB — TYPE AND SCREEN
ABO/RH(D): O POS
Antibody Screen: NEGATIVE
Unit division: 0

## 2021-06-24 LAB — GLUCOSE, CAPILLARY
Glucose-Capillary: 128 mg/dL — ABNORMAL HIGH (ref 70–99)
Glucose-Capillary: 73 mg/dL (ref 70–99)
Glucose-Capillary: 84 mg/dL (ref 70–99)
Glucose-Capillary: 113 mg/dL — ABNORMAL HIGH (ref 70–99)
Glucose-Capillary: 99 mg/dL (ref 70–99)

## 2021-06-24 LAB — BPAM RBC
Blood Product Expiration Date: 202208232359
ISSUE DATE / TIME: 202208160945
Unit Type and Rh: 5100

## 2021-06-24 LAB — CBC
HCT: 25.8 % — ABNORMAL LOW (ref 39.0–52.0)
Hemoglobin: 8.1 g/dL — ABNORMAL LOW (ref 13.0–17.0)
MCH: 26 pg (ref 26.0–34.0)
MCHC: 31.4 g/dL (ref 30.0–36.0)
MCV: 82.7 fL (ref 80.0–100.0)
Platelets: 635 10*3/uL — ABNORMAL HIGH (ref 150–400)
RBC: 3.12 MIL/uL — ABNORMAL LOW (ref 4.22–5.81)
RDW: 17 % — ABNORMAL HIGH (ref 11.5–15.5)
WBC: 13.4 10*3/uL — ABNORMAL HIGH (ref 4.0–10.5)
nRBC: 0 % (ref 0.0–0.2)

## 2021-06-24 LAB — BASIC METABOLIC PANEL
Anion gap: 9 (ref 5–15)
BUN: 23 mg/dL (ref 8–23)
CO2: 23 mmol/L (ref 22–32)
Calcium: 9.3 mg/dL (ref 8.9–10.3)
Chloride: 96 mmol/L — ABNORMAL LOW (ref 98–111)
Creatinine, Ser: 1.01 mg/dL (ref 0.61–1.24)
GFR, Estimated: >60 mL/min (ref >60)
Glucose, Bld: 128 mg/dL — ABNORMAL HIGH (ref 70–99)
Potassium: 4.5 mmol/L (ref 3.5–5.1)
Sodium: 128 mmol/L — ABNORMAL LOW (ref 135–145)

## 2021-06-24 NOTE — Plan of Care (Signed)
  Problem: RH Balance Goal: LTG Patient will maintain dynamic sitting balance (PT) Description: LTG:  Patient will maintain dynamic sitting balance with assistance during mobility activities (PT) Flowsheets (Taken 06/24/2021 1931) LTG: Pt will maintain dynamic sitting balance during mobility activities with:: Supervision/Verbal cueing   Problem: RH Bed Mobility Goal: LTG Patient will perform bed mobility with assist (PT) Description: LTG: Patient will perform bed mobility with assistance, with/without cues (PT). Flowsheets (Taken 06/24/2021 1931) LTG: Pt will perform bed mobility with assistance level of: Supervision/Verbal cueing   Problem: RH Bed to Chair Transfers Goal: LTG Patient will perform bed/chair transfers w/assist (PT) Description: LTG: Patient will perform bed to chair transfers with assistance (PT). Flowsheets (Taken 06/24/2021 1931) LTG: Pt will perform Bed to Chair Transfers with assistance level: Supervision/Verbal cueing   Problem: RH Car Transfers Goal: LTG Patient will perform car transfers with assist (PT) Description: LTG: Patient will perform car transfers with assistance (PT). Flowsheets (Taken 06/24/2021 1931) LTG: Pt will perform car transfers with assist:: Contact Guard/Touching assist   Problem: RH Wheelchair Mobility Goal: LTG Patient will propel w/c in controlled environment (PT) Description: LTG: Patient will propel wheelchair in controlled environment, # of feet with assist (PT) Flowsheets (Taken 06/24/2021 1931) LTG: Pt will propel w/c in controlled environ  assist needed:: Independent with assistive device LTG: Propel w/c distance in controlled environment: 158ft Goal: LTG Patient will propel w/c in home environment (PT) Description: LTG: Patient will propel wheelchair in home environment, # of feet with assistance (PT). Flowsheets (Taken 06/24/2021 1931) LTG: Pt will propel w/c in home environ  assist needed:: Independent with assistive device LTG:  Propel w/c distance in home environment: 27ft

## 2021-06-24 NOTE — Evaluation (Signed)
Physical Therapy Assessment and Plan  Patient Details  Name: Todd Duran MRN: [id number] Date of Birth: 1946-05-20  PT Diagnosis: Abnormal posture, Edema, Muscle weakness, and Pain in R LE Rehab Potential: Fair ELOS: ~2 weeks   Today's Date: 06/24/2021 PT Individual Time: 3354-5625 PT Individual Time Calculation (min): 97 min    Hospital Problem: Principal Problem:   Left above-knee amputee [facility])   Past Medical History:  Past Medical History:  Diagnosis Date   Diabetes mellitus without complication ([place])    Hypertension    Past Surgical History:  Past Surgical History:  Procedure Laterality Date   ABDOMINAL AORTOGRAM W/LOWER EXTREMITY N/A 05/12/2021   Procedure: ABDOMINAL AORTOGRAM W/LOWER EXTREMITY;  Surgeon: Todd Mitchell, MD;  Location: [facility];  Service: Cardiovascular;  Laterality: N/A;   AMPUTATION Left 05/13/2021   Procedure: AMPUTATION BELOW KNEE LEFT ;  Surgeon: Todd Mitchell, MD;  Location: [facility];  Service: Vascular;  Laterality: Left;   AMPUTATION Left 05/20/2021   Procedure: AMPUTATION ABOVE KNEE;  Surgeon: Todd Heck, MD;  Location: [facility];  Service: Vascular;  Laterality: Left;   AMPUTATION Right 06/05/2021   Procedure: RIGHT FIFTH TOE AMPUTATION;  Surgeon: Todd Mitchell, MD;  Location: [facility];  Service: Vascular;  Laterality: Right;   AMPUTATION Right 06/10/2021   Procedure: RIGHT THIRD AND  FOURTH TOE AMPUTATION;  Surgeon: Todd Mitchell, MD;  Location: [facility];  Service: Vascular;  Laterality: Right;   AMPUTATION Right 06/21/2021   Procedure: RIGHT BELOW KNEE AMPUTATION;  Surgeon: Todd Mitchell, MD;  [facility];  Service: Vascular;  Laterality: Right;   ENDARTERECTOMY FEMORAL Right 06/05/2021   Procedure: RIGHT FEMORAL ENDARTERECTOMY;  Surgeon: Todd Mitchell, MD;  [facility];  Service: Vascular;  Laterality: Right;   FEMORAL-POPLITEAL BYPASS GRAFT Right 06/10/2021   Procedure: RIGHT FEMORAL TO ABOVE KNEE POPLITEAL ARTERY  BYPASS GRAFTING USING RIGHT NON REVERSED GREATER SAPHENOUS VEIN;  Surgeon: Todd Mitchell, MD;  Location[facility];  Service: Vascular;  Laterality: Right;   HERNIA REPAIR     INSERTION OF ILIAC STENT Right 06/05/2021   Procedure: INSERTION OF RIGHT ILIAC AND SUPERFICIAL FEMORAL ARTERY STENT;  Surgeon: Todd Mitchell, MD;  [facility];  Service: Vascular;  Laterality: Right;   KNEE SURGERY     PATCH ANGIOPLASTY Right 06/05/2021   Procedure: PATCH ANGIOPLASTY OF RIGHT FEMORAL ARTERY USING XENOSURE BIOLOGIC PATCH;  Surgeon: Todd Mitchell, MD;  [facility];  Service: Vascular;  Laterality: Right;   VEIN HARVEST Right 06/10/2021   Procedure: VEIN HARVEST OF RIGHT GREATER SAPHENOUS VEIN;  Surgeon: Todd Mitchell, MD;  [facility];  Service: Vascular;  Laterality: Right;    Assessment & Plan Clinical Impression: Patient is a 75 y.o. right-handed male history of diabetes mellitus, hypertension ,tobacco and alcohol use.  Lives with spouse independent prior to admission.  Spouse works during the day.  Presented 05/13/2021 with progressive left foot pain x2 months with ischemic changes and findings of decreased femoral pulses bilaterally.  Underwent ultrasound-guided left femoral artery with abdominal aortogram showing bilateral renal artery stenosis.  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 Trula Duran.  [facility] course anemia as well as hyponatremia 130.  He was cleared to begin subcutaneous heparin for DVT prophylaxis.  Therapy evaluations completed and he was admitted to inpatient rehab services 05/15/2021 with slow progressive gains.  Close monitoring of BKA site noted to be boggy necrotic area concerning along incision line with vascular surgery notified for follow-up.  A few the patient staples were removed noted underlying abscess with poor healing and noted increased necrosis.  He was discharged to acute  care services 05/20/2021 and underwent left AKA per Dr. Carlis Duran.  Postoperative course anemia transfused 2 units packed red blood cells.  He was readmitted back to inpatient rehab services 05/25/2021 he was doing well after his left AKA however noted ischemic changes to the right fifth toe with ulceration and felt that amputation was imminent as well as need for revascularization thus he was discharged back to acute care services 06/05/2021 and underwent right iliofemoral enterectomy with bovine patch angioplasty as well as right fifth toe amputation including metatarsal head per Dr. Trula Duran..  He was nonweightbearing to left lower extremity and weightbearing as tolerated right lower extremity with Darco shoe boot.  Patient with persistent ischemic changes on the dorsum of the right foot following his bypass and underwent redo right femoral artery exposure right common femoral to above-knee popliteal artery bypass graft with ipsilateral and on reverse greater saphenous vein as well as right fourth and fifth toe amputation including metatarsal head 06/10/2021 unfortunately his foot remained significantly ischemic with foul odor and necrotic tissue and underwent right BKA 06/21/2021 per Dr. Trula Duran..  He has been cleared to begin subcutaneous heparin for DVT prophylaxis.  Acute blood loss anemia and monitored.  Therapy evaluations again completed and patient was readmitted back to inpatient rehab services to resume inpatient comprehensive therapies. Patient transferred to CIR on 06/23/2021 .   Patient currently requires min assist with wheelchair level mobility secondary to muscle weakness, decreased cardiorespiratoy endurance, unbalanced muscle activation and decreased motor planning, decreased initiation, decreased attention, decreased awareness, decreased problem solving, decreased safety awareness, and delayed processing, and decreased sitting balance, decreased postural control, and difficulty maintaining precautions.   Prior to hospitalization, patient was independent  with mobility and lived with Spouse, Family (wife and son) in a House home.  Home access is 4Stairs to enter.  Patient will benefit from skilled PT intervention to maximize safe functional mobility, minimize fall risk, and decrease caregiver burden for planned discharge home with 24 hour supervision.  Anticipate patient will benefit from follow up [place] at discharge.  PT - End of Session Activity Tolerance: Tolerates 30+ min activity with multiple rests Endurance Deficit: Yes Endurance Deficit Description: frequent rest breaks with lethargy throughout and pt frequently "nodding off" PT Assessment Rehab Potential (ACUTE/IP ONLY): Fair PT Barriers to Discharge: [place] home environment;Decreased caregiver support;Home environment access/layout;Lack of/limited family support;Weight bearing restrictions;Behavior PT Patient demonstrates impairments in the following area(s): Balance;Safety;Behavior;Sensory;Edema;Skin Integrity;Endurance;Motor;Nutrition;Pain;Perception PT Transfers Functional Problem(s): Bed Mobility;Bed to Chair;Car;Furniture PT Locomotion Functional Problem(s): Wheelchair Mobility PT Plan PT Intensity: Minimum of 1-2 x/day ,45 to 90 minutes PT Frequency: 5 out of 7 days PT Duration Estimated Length of Stay: ~2 weeks PT Treatment/Interventions: Financial risk analyst;Neuromuscular re-education;Psychosocial support;UE/LE Strength taining/ROM;Wheelchair propulsion/positioning;Balance/vestibular training;Discharge planning;Functional electrical stimulation;Pain management;Skin care/wound management;Therapeutic Activities;UE/LE Coordination activities;Cognitive remediation/compensation;Disease management/prevention;Functional mobility training;Patient/family education;Splinting/orthotics;Therapeutic Exercise;Visual/perceptual remediation/compensation PT Transfers Anticipated Outcome(s): supervision PT  Locomotion Anticipated Outcome(s): N/A PT Recommendation Recommendations for Other Services:  (continue to monitor for SLP needs (unable to determine baseline cognition)) Follow Up Recommendations: Home health PT;24 hour supervision/assistance Patient destination: Home Equipment Recommended: To be determined   PT Evaluation Precautions/Restrictions Precautions Precautions: Fall;Other (comment) Precaution Comments: L AKA (05/20/2021), R BKA (06/21/2021) Restrictions Weight Bearing Restrictions: Yes RLE Weight  Bearing: Non weight bearing LLE Weight Bearing: Non weight bearing Pain Reports hx of low back pain but during session pt denies pain stating "ignore my faces, I'm not hurting." Home Living/Prior Functioning Home Living Available Help at Discharge: Available 24 hours/day Type of Home: House Home Access: Stairs to enter [employer] of Steps: 4 Entrance Stairs-Rails: Right;Left Home Layout: Two level Alternate Level Stairs-Number of Steps: Reports his bed/bath is downstairs,  Lives With: Spouse;Family (wife, Letta Moynahan, and son, Legrand Como) Prior Function Level of Independence: Independent with basic ADLs;Independent with gait;Independent with transfers;Independent with homemaking with ambulation (prior to initial hospitalization at beginning of July 2022)  Able to Take Stairs?: Yes Comments: Prior to initial BKA, pt was independent in all tasks Perception   Perception Perception: Within Functional Limits Praxis Praxis: Impaired Praxis Impairment Details: Initiation (slow to initiate tasks despite cuing)  Cognition  Overall Cognitive Status: No family/caregiver present to determine baseline cognitive functioning Arousal/Alertness: Lethargic Orientation Level: Oriented to person;Oriented to place;Oriented to situation;Disoriented to time (disoriented to month and date but knows year) Attention: Focused;Sustained Focused Attention: Appears intact Sustained Attention:  Impaired (pt often nodding off due to fatigue) Memory: Impaired Memory Impairment: Decreased recall of new information;Retrieval deficit Immediate Memory Recall: Sock;Bed;Blue Memory Recall Sock: With Cue Memory Recall Blue: With Cue Memory Recall Bed: Not able to recall Awareness: Impaired Problem Solving: Impaired Safety/Judgment: Impaired Comments: pt very lethargic throughout session Sensation Sensation Light Touch: Appears Intact (in accessible areas) Hot/Cold: Not tested Proprioception: Appears Intact Stereognosis: Not tested Additional Comments: unable to fully and accurately test d/t cognition/lethargy Coordination Gross Motor Movements are Fluid and Coordinated: No Fine Motor Movements are Fluid and Coordinated: No Coordination and Movement Description: limited d/t cognition/lethargy, poor sitting balance, difficulty following commands Finger Nose Finger Test: unable to follow commands for testing Motor  Motor Motor: Abnormal postural alignment and control Motor - Skilled Clinical Observations: grossly uncoordinated, trunk sway at EOB, lethargic   Trunk/Postural Assessment  Cervical Assessment Cervical Assessment: Exceptions to [facility] (slight forward head) Thoracic Assessment Thoracic Assessment: Exceptions to [facility] (thoracic rounding) Lumbar Assessment Lumbar Assessment: Exceptions to [facility] (posterior pelvic tilt) Postural Control Postural Control: Deficits on evaluation Trunk Control: requires UE support for static sitting balance EOB Righting Reactions: delayed and inadequate Protective Responses: delayed and inadequate  Balance Balance Balance Assessed: Yes Static Sitting Balance Static Sitting - Balance Support: Bilateral upper extremity supported Static Sitting - Level of Assistance: Other (comment) (CGA) Dynamic Sitting Balance Dynamic Sitting - Balance Support: During functional activity Dynamic Sitting - Level of Assistance: 3: Mod assist Extremity  Assessment      RLE Assessment RLE Assessment: Exceptions to [facility] Active Range of Motion (AROM) Comments: WFL - some tightness noted during knee extension in sitting but full ROM in supine General Strength Comments: unable to tolerate resistance to movements due to pain but demonstrates ability to move against gravity through > 50% ROM for all hip movements and has ability to extend and flex knee against gravity LLE Assessment LLE Assessment: Exceptions to [facility] Active Range of Motion (AROM) Comments: WFL (did not formally assess hip extension ROM in prone but able to lie flat in supine) General Strength Comments: Has ability to move L hip in 4 planes of movement against gravity demonstrating at least 3/5  Care Tool Care Tool Bed Mobility Roll left and right activity   Roll left and right assist level: Supervision/Verbal cueing    Sit to lying activity   Sit to lying assist level: Minimal Assistance -  Patient > 75% (for R LE management)    Lying to sitting edge of bed activity   Lying to sitting edge of bed assist level: Minimal Assistance - Patient > 75%     Care Tool Transfers Sit to stand transfer Sit to stand activity did not occur: Safety/medical concerns      Chair/bed transfer   Chair/bed transfer assist level: Minimal Assistance - Patient > 75%     Toilet transfer   Assist Level:  (did not assess at eval d/t cognition)    Car transfer Car transfer activity did not occur: Safety/medical concerns        Care Tool Locomotion Ambulation Ambulation activity did not occur: Safety/medical concerns        Walk 10 feet activity Walk 10 feet activity did not occur: Safety/medical concerns       Walk 50 feet with 2 turns activity Walk 50 feet with 2 turns activity did not occur: Safety/medical concerns      Walk 150 feet activity Walk 150 feet activity did not occur: Safety/medical concerns      Walk 10 feet on uneven surfaces activity Walk 10 feet on uneven surfaces  activity did not occur: Safety/medical concerns      Stairs Stair activity did not occur: Safety/medical concerns        Walk up/down 1 step activity Walk up/down 1 step or curb (drop down) activity did not occur: Safety/medical concerns     Walk up/down 4 steps activity did not occuR: Safety/medical concerns  Walk up/down 4 steps activity      Walk up/down 12 steps activity Walk up/down 12 steps activity did not occur: Safety/medical concerns      Pick up small objects from floor Pick up small object from the floor (from standing position) activity did not occur: Safety/medical concerns      Wheelchair Will patient use wheelchair at discharge?: Yes Type of Wheelchair: Manual   Wheelchair assist level: Set up assist;Supervision/Verbal cueing Max wheelchair distance: 141f  Wheel 50 feet with 2 turns activity   Assist Level: Supervision/Verbal cueing  Wheel 150 feet activity   Assist Level: Supervision/Verbal cueing    Refer to Care Plan for Long Term Goals  SHORT TERM GOAL WEEK 1 PT Short Term Goal 1 (Week 1): Pt will perform supine<>sit with CGA PT Short Term Goal 2 (Week 1): Pt will perform bed<>chair transfers using LRAD with CGA PT Short Term Goal 3 (Week 1): Pt will perform car transfer with min assist PT Short Term Goal 4 (Week 1): Pt will manage w/c parts and setup for transfer to bed with only min cuing PT Short Term Goal 5 (Week 1): Pt will propel w/c at least 2013fwith supervision  Recommendations for other services: Other: Continue to monitor cognition for possible SLP referral  Skilled Therapeutic Intervention Pt received supine in bed asleep, but upon awakening agreeable to therapy session. Evaluation completed (see details above) with patient education regarding purpose of PT evaluation, PT POC and goals, therapy schedule, weekly team meetings, and other CIR information including safety plan and fall risk safety. Pt remains lethargic throughout session often  nodding off for brief moments and requiring verbal cuing for increased arousal - pt also demos impaired awareness but difficult to determine if due to lethargy or cognitive impairments (monitor for need of SLP consult). R LE shrinker in place. R LE limb guard in place - noticed condensation accumulation inside of the brace therefore doffed at end of session  for it to dry while pt lying supine in bed.  Pt unaware that his breakfast tray had been delivered - pt consumed ~10% during subjective exam. Thearpist provided pt with manual wheelchair with cushion and R LE amputee pad to promote increased, upright OOB activity tolerance and promote increased independence with functional mobility at wheelchair level. Performed the below mobility tasks with the specified levels of assistance. During lateral scoot EOB>w/c with poor hip clearance and performing slow movements with increased time to motor plan/process hand placement and initiate mobility. Pt requires repeated cuing to initiate tasks during session. At end of session performed R slide board transfer w/c>EOB with max assist for board placement and CGA for scooting with pt continuing to lack ability to clear hips. Pt left supine in bed with needs in reach and bed alarm on with pt quickly falling asleep.  Mobility Bed Mobility Bed Mobility: Supine to Sit;Sit to Supine Supine to Sit: Minimal Assistance - Patient > 75% Sit to Supine: Minimal Assistance - Patient > 75% Transfers Transfers: Lateral/Scoot Transfers Lateral/Scoot Transfers: Minimal Assistance - Patient > 75% Transfer (Assistive device): Other (Comment) (slide board) Locomotion  Gait Ambulation: No Stairs / Additional Locomotion Stairs: No Architect: Yes Wheelchair Assistance: Chartered loss adjuster: Both upper extremities Wheelchair Parts Management: Needs assistance Distance: 131f   Discharge Criteria: Patient will be discharged  from PT if patient refuses treatment 3 consecutive times without medical reason, if treatment goals not met, if there is a change in medical status, if patient makes no progress towards goals or if patient is discharged from hospital.  The above assessment, treatment plan, treatment alternatives and goals were discussed and mutually agreed upon: by patient  CTawana Scale, PT, DPT, NCS, CSRS 06/24/2021, 7:48 AM

## 2021-06-24 NOTE — Plan of Care (Signed)
  Problem: RH Balance Goal: LTG: Patient will maintain dynamic sitting balance (OT) Description: LTG:  Patient will maintain dynamic sitting balance with assistance during activities of daily living (OT) Flowsheets (Taken 06/24/2021 1659) LTG: Pt will maintain dynamic sitting balance during ADLs with: Supervision/Verbal cueing   Problem: RH Eating Goal: LTG Patient will perform eating w/assist, cues/equip (OT) Description: LTG: Patient will perform eating with assist, with/without cues using equipment (OT) Flowsheets (Taken 06/24/2021 1659) LTG: Pt will perform eating with assistance level of: Independent   Problem: RH Grooming Goal: LTG Patient will perform grooming w/assist,cues/equip (OT) Description: LTG: Patient will perform grooming with assist, with/without cues using equipment (OT) Flowsheets (Taken 06/24/2021 1659) LTG: Pt will perform grooming with assistance level of: Set up assist    Problem: RH Bathing Goal: LTG Patient will bathe all body parts with assist levels (OT) Description: LTG: Patient will bathe all body parts with assist levels (OT) Flowsheets (Taken 06/24/2021 1659) LTG: Pt will perform bathing with assistance level/cueing: Minimal Assistance - Patient > 75%   Problem: RH Dressing Goal: LTG Patient will perform upper body dressing (OT) Description: LTG Patient will perform upper body dressing with assist, with/without cues (OT). Flowsheets (Taken 06/24/2021 1659) LTG: Pt will perform upper body dressing with assistance level of: Supervision/Verbal cueing Goal: LTG Patient will perform lower body dressing w/assist (OT) Description: LTG: Patient will perform lower body dressing with assist, with/without cues in positioning using equipment (OT) Flowsheets (Taken 06/24/2021 1659) LTG: Pt will perform lower body dressing with assistance level of: Minimal Assistance - Patient > 75%   Problem: RH Toileting Goal: LTG Patient will perform toileting task (3/3 steps) with  assistance level (OT) Description: LTG: Patient will perform toileting task (3/3 steps) with assistance level (OT)  Flowsheets (Taken 06/24/2021 1659) LTG: Pt will perform toileting task (3/3 steps) with assistance level: Minimal Assistance - Patient > 75%   Problem: RH Toilet Transfers Goal: LTG Patient will perform toilet transfers w/assist (OT) Description: LTG: Patient will perform toilet transfers with assist, with/without cues using equipment (OT) Flowsheets (Taken 06/24/2021 1659) LTG: Pt will perform toilet transfers with assistance level of: Supervision/Verbal cueing   Problem: RH Tub/Shower Transfers Goal: LTG Patient will perform tub/shower transfers w/assist (OT) Description: LTG: Patient will perform tub/shower transfers with assist, with/without cues using equipment (OT) Flowsheets (Taken 06/24/2021 1659) LTG: Pt will perform tub/shower stall transfers with assistance level of: Supervision/Verbal cueing   Problem: RH Awareness Goal: LTG: Patient will demonstrate awareness during functional activites type of (OT) Description: LTG: Patient will demonstrate awareness during functional activites type of (OT) Flowsheets (Taken 06/24/2021 1659) Patient will demonstrate awareness during functional activites type of: Emergent LTG: Patient will demonstrate awareness during functional activites type of (OT): Supervision

## 2021-06-24 NOTE — Progress Notes (Signed)
Inpatient Rehabilitation  Patient information reviewed and entered into eRehab system by Coda Filler M. Suki Crockett, M.A., CCC/SLP, PPS Coordinator.  Information including medical coding, functional ability and quality indicators will be reviewed and updated through discharge.    

## 2021-06-24 NOTE — Evaluation (Signed)
Occupational Therapy Assessment and Plan  Patient Details  Name: Todd Duran MRN: [id number] Date of Birth: 11/09/1945  OT Diagnosis: abnormal posture, cognitive deficits, muscle weakness (generalized), and L AKA and R BKA Rehab Potential:   ELOS: 14-16 days   Today's Date: 06/24/2021 OT Individual Time: 8416-6063 OT Individual Time Calculation (min): 58 min     Hospital Problem: Principal Problem:   Left above-knee amputee [facility])   Past Medical History:  Past Medical History:  Diagnosis Date   Diabetes mellitus without complication ([place])    Hypertension    Past Surgical History:  Past Surgical History:  Procedure Laterality Date   ABDOMINAL AORTOGRAM W/LOWER EXTREMITY N/A 05/12/2021   Procedure: ABDOMINAL AORTOGRAM W/LOWER EXTREMITY;  Surgeon: Serafina Mitchell, MD;  Location: [place] [facility];  Service: Cardiovascular;  Laterality: N/A;   AMPUTATION Left 05/13/2021   Procedure: AMPUTATION BELOW KNEE LEFT ;  Surgeon: Serafina Mitchell, MD;  Location: [place];  Service: Vascular;  Laterality: Left;   AMPUTATION Left 05/20/2021   Procedure: AMPUTATION ABOVE KNEE;  Surgeon: Marty Heck, MD;  Location: [place];  Service: Vascular;  Laterality: Left;   AMPUTATION Right 06/05/2021   Procedure: RIGHT FIFTH TOE AMPUTATION;  Surgeon: Serafina Mitchell, MD;  [facility];  Service: Vascular;  Laterality: Right;   AMPUTATION Right 06/10/2021   Procedure: RIGHT THIRD AND  FOURTH TOE AMPUTATION;  Surgeon: Serafina Mitchell, MD;  [facility];  Service: Vascular;  Laterality: Right;   AMPUTATION Right 06/21/2021   Procedure: RIGHT BELOW KNEE AMPUTATION;  Surgeon: Serafina Mitchell, MD;  [facility];  Service: Vascular;  Laterality: Right;   ENDARTERECTOMY FEMORAL Right 06/05/2021   Procedure: RIGHT FEMORAL ENDARTERECTOMY;  Surgeon: Serafina Mitchell, MD;  [facility];  Service: Vascular;  Laterality: Right;   FEMORAL-POPLITEAL BYPASS GRAFT Right 06/10/2021   Procedure: RIGHT FEMORAL  TO ABOVE KNEE POPLITEAL ARTERY BYPASS GRAFTING USING RIGHT NON REVERSED GREATER SAPHENOUS VEIN;  Surgeon: Serafina Mitchell, MD;  [facility];  Service: Vascular;  Laterality: Right;   HERNIA REPAIR     INSERTION OF ILIAC STENT Right 06/05/2021   Procedure: INSERTION OF RIGHT ILIAC AND SUPERFICIAL FEMORAL ARTERY STENT;  Surgeon: Serafina Mitchell, MD;  [facility];  Service: Vascular;  Laterality: Right;   KNEE SURGERY     PATCH ANGIOPLASTY Right 06/05/2021   Procedure: PATCH ANGIOPLASTY OF RIGHT FEMORAL ARTERY USING XENOSURE BIOLOGIC PATCH;  Surgeon: Serafina Mitchell, MD;  [facility];  Service: Vascular;  Laterality: Right;   VEIN HARVEST Right 06/10/2021   Procedure: VEIN HARVEST OF RIGHT GREATER SAPHENOUS VEIN;  Surgeon: Serafina Mitchell, MD;  Location: [place];  Service: Vascular;  Laterality: Right;    Assessment & Plan Clinical Impression: Todd Prewitt. Duran is a 75 year old right-handed male history of diabetes mellitus, hypertension ,tobacco and alcohol use.  Lives with spouse independent prior to admission.  Spouse works during the day.  Presented 05/13/2021 with progressive left foot pain x2 months with ischemic changes and findings of decreased femoral pulses bilaterally.  Underwent ultrasound-guided left femoral artery with abdominal aortogram showing bilateral renal artery stenosis.  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 Trula Slade.  [facility] course anemia as well as hyponatremia 130.  He was cleared to begin subcutaneous heparin for DVT prophylaxis.  Therapy evaluations completed and he was admitted to inpatient rehab  services 05/15/2021 with slow progressive gains.  Close monitoring of BKA site noted to be boggy necrotic area concerning along incision line with vascular surgery notified for follow-up.  A few the patient staples were removed noted underlying abscess with poor healing and noted  increased necrosis.  He was discharged to acute care services 05/20/2021 and underwent left AKA per Dr. Carlis Abbott.  Postoperative course anemia transfused 2 units packed red blood cells.  He was readmitted back to inpatient rehab services 05/25/2021 he was doing well after his left AKA however noted ischemic changes to the right fifth toe with ulceration and felt that amputation was imminent as well as need for revascularization thus he was discharged back to acute care services 06/05/2021 and underwent right iliofemoral enterectomy with bovine patch angioplasty as well as right fifth toe amputation including metatarsal head per Dr. Trula Slade..  He was nonweightbearing to left lower extremity and weightbearing as tolerated right lower extremity with Darco shoe boot.  Patient with persistent ischemic changes on the dorsum of the right foot following his bypass and underwent redo right femoral artery exposure right common femoral to above-knee popliteal artery bypass graft with ipsilateral and on reverse greater saphenous vein as well as right fourth and fifth toe amputation including metatarsal head 06/10/2021 unfortunately his foot remained significantly ischemic with foul odor and necrotic tissue and underwent right BKA 06/21/2021 per Dr. Trula Slade..  He has been cleared to begin subcutaneous heparin for DVT prophylaxis.  Acute blood loss anemia and monitored.  Therapy evaluations again completed and patient was readmitted back to inpatient rehab services to resume inpatient comprehensive therapies  Patient transferred to CIR on 06/23/2021 .    Patient currently requires max with basic self-care skills secondary to muscle weakness, decreased cardiorespiratoy endurance, impaired timing and sequencing, decreased coordination, and decreased motor planning, decreased initiation, decreased attention, decreased awareness, decreased problem solving, decreased safety awareness, decreased memory, and delayed processing, and decreased  sitting balance, decreased postural control, and decreased balance strategies.  Prior to hospitalization, patient could complete BADL and IADL with independent .  Patient will benefit from skilled intervention to decrease level of assist with basic self-care skills and increase independence with basic self-care skills prior to discharge home with care partner.  Anticipate patient will require 24 hour supervision and follow up home health.  OT - End of Session Activity Tolerance: Decreased this session Endurance Deficit: Yes OT Assessment OT Patient demonstrates impairments in the following area(s): Balance;Behavior;Cognition;Endurance;Motor;Pain;Perception;Safety OT Basic ADL's Functional Problem(s): Grooming;Bathing;Dressing;Toileting;Eating OT Transfers Functional Problem(s): Toilet;Tub/Shower OT Additional Impairment(s): None OT Plan OT Intensity: Minimum of 1-2 x/day, 45 to 90 minutes OT Frequency: 5 out of 7 days OT Duration/Estimated Length of Stay: 14-16 days OT Treatment/Interventions: Balance/vestibular training;Self Care/advanced ADL retraining;Therapeutic Exercise;Wheelchair propulsion/positioning;Disease mangement/prevention;Cognitive remediation/compensation;DME/adaptive equipment instruction;Pain management;Skin care/wound managment;UE/LE Strength taining/ROM;Community reintegration;Functional electrical stimulation;Patient/family education;Splinting/orthotics;UE/LE Coordination activities;Discharge planning;Functional mobility training;Psychosocial support;Visual/perceptual remediation/compensation;Therapeutic Activities;Neuromuscular re-education OT Self Feeding Anticipated Outcome(s): Independent OT Basic Self-Care Anticipated Outcome(s): Supervision transfers, Min A LB OT Toileting Anticipated Outcome(s): Supervision OT Bathroom Transfers Anticipated Outcome(s): Supervision OT Recommendation Recommendations for Other Services: Speech consult Patient destination: Home Follow  Up Recommendations: Home health OT Equipment Recommended: To be determined   OT Evaluation Precautions/Restrictions  Precautions Precautions: Fall Precaution Comments: L AKA (05/20/2021), R BKA (06/21/2021), reduced cognition Restrictions Weight Bearing Restrictions: Yes RLE Weight Bearing: Non weight bearing LLE Weight Bearing: Non weight bearing General PT Missed Treatment Reason: Patient fatigue Vital Signs Therapy Vitals Temp: 99.1 F (37.3 C) Pulse Rate: 86  Resp: 18 BP: 123/72 Patient Position (if appropriate): Lying Oxygen Therapy SpO2: 100 % O2 Device: Room Air Pain Pain Assessment Pain Scale: 0-10 Pain Score: 0-No pain (denied pain multiple times) Home Living/Prior Functioning Home Living Available Help at Discharge: Available 24 hours/day Type of Home: House Home Access: Stairs to enter [employer] of Steps: 4 Entrance Stairs-Rails: Right, Left Home Layout: Two level, Able to live on main level with bedroom/bathroom Alternate Level Stairs-Number of Steps: Reports his bed/bath is downstairs Bathroom Shower/Tub: Optometrist: Yes  Lives With: Spouse, Family (wife and son) IADL History 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, retired Manufacturing systems engineer per chart Prior Function Level of Independence: Independent with basic ADLs, Independent with gait, Independent with transfers, Independent with homemaking with ambulation  Able to Take Stairs?: Yes Comments: Prior to initial BKA, pt was independent in all tasks Vision Baseline Vision/History: Wears glasses Wears Glasses: At all times (per chart) Patient Visual Report: No change from baseline Perception  Perception: Within Functional Limits Cognition Overall Cognitive Status: Impaired/Different from baseline Arousal/Alertness: Lethargic Orientation Level: Person Person: Oriented Place: Disoriented Situation: Disoriented Year:  Other (Comment) (unable) Month:  (unable) Day of Week: Incorrect Memory: Impaired Memory Impairment: Decreased recall of new information;Retrieval deficit Immediate Memory Recall: Sock;Bed;Blue Memory Recall Sock: With Cue Memory Recall Blue: With Cue Memory Recall Bed: Not able to recall Attention: Focused;Sustained Focused Attention: Appears intact Sustained Attention: Impaired Awareness: Impaired Problem Solving: Impaired Safety/Judgment: Impaired Comments: pt very lethargic, A&O x1, unable to follow simple ADL commands Sensation Sensation Light Touch:  (unable to fully and accurately test d/t cognition/lethargy) Additional Comments: unable to fully and accurately test d/t cognition/lethargy Coordination Gross Motor Movements are Fluid and Coordinated: No Fine Motor Movements are Fluid and Coordinated: No Coordination and Movement Description: limited d/t cognition/lethargy, poor sitting balance, difficulty following commands Finger Nose Finger Test: unable to follow commands for testing Motor  Motor Motor: Abnormal postural alignment and control Motor - Skilled Clinical Observations: grossly uncoordinated, trunk sway at EOB, lethargic  Trunk/Postural Assessment  Cervical Assessment Cervical Assessment: Exceptions to [facility] Thoracic Assessment Thoracic Assessment: Exceptions to [facility] Lumbar Assessment Lumbar Assessment: Exceptions to [facility] Postural Control Postural Control: Deficits on evaluation Trunk Control: trunk sway sitting EOB, poor control Righting Reactions: delayed and inadequate Protective Responses: delayed and inadequate  Balance Balance Balance Assessed: Yes Static Sitting Balance Static Sitting - Balance Support: Feet supported;Bilateral upper extremity supported Static Sitting - Level of Assistance: 4: Min assist Dynamic Sitting Balance Dynamic Sitting - Balance Support: Feet supported;During functional activity Dynamic Sitting - Level of Assistance: 3:  Mod assist Extremity/Trunk Assessment RUE Assessment RUE Assessment: Exceptions to [facility] General Strength Comments: unable to fully and accurately test d/t needing BUE support static sitting, limited by cognition LUE Assessment LUE Assessment: Exceptions to [facility] General Strength Comments: unable to fully and accurately test d/t needing BUE support static sitting, limited by cognition  Care Tool Care Tool Self Care Eating    Max A    Oral Care     Max A    Bathing   Body parts bathed by patient:  (unable to bathe at eval d/t cognition)          Upper Body Dressing(including orthotics)       Assist Level:  (unable to dress at eval d/t cognition)    Lower Body Dressing (excluding footwear)   What is the patient wearing?: Pants Assist for lower body dressing: Dependent -  Patient 0%    Putting on/Taking off footwear      Dependent (shrinker and limb guard)       Care Tool Toileting Toileting activity   Assist for toileting:  (did not assess at eval)     Care Tool Bed Mobility Roll left and right activity   Roll left and right assist level: Maximal Assistance - Patient 25 - 49%    Sit to lying activity   Sit to lying assist level: Moderate Assistance - Patient 50 - 74%    Lying to sitting edge of bed activity   Lying to sitting edge of bed assist level: Maximal Assistance - Patient 25 - 49%     Care Tool Transfers Sit to stand transfer Sit to stand activity did not occur: Safety/medical concerns      Chair/bed transfer   Chair/bed transfer assist level:  (unable at OT eval)     Toilet transfer   Assist Level:  (did not assess at eval d/t cognition)     Care Tool Cognition Expression of Ideas and Wants Expression of Ideas and Wants: Frequent difficulty - frequently exhibits difficulty with expressing needs and ideas   Understanding Verbal and Non-Verbal Content Understanding Verbal and Non-Verbal Content: Sometimes understands - understands only basic  conversations or simple, direct phrases. Frequently requires cues to understand   Memory/Recall Ability *first 3 days only Memory/Recall Ability *first 3 days only: None of the above were recalled    Refer to Care Plan for Long Term Goals  SHORT TERM GOAL WEEK 1 OT Short Term Goal 1 (Week 1): Pt will complete LB dressing with Mod A and use of AE PRN OT Short Term Goal 2 (Week 1): Pt will complete BSC/toilet transfer with Mod A OT Short Term Goal 3 (Week 1): Pt will sit EOB with no more than CGA for ADL routine OT Short Term Goal 4 (Week 1): Pt will self feed with Supervision with good alertness and no more than Min cues to attend  Recommendations for other services: Other: SLP for cognition    Skilled Therapeutic Intervention ADL ADL Eating: Not assessed Grooming: Not assessed Upper Body Bathing: Not assessed Lower Body Bathing: Not assessed Upper Body Dressing: Not assessed Lower Body Dressing: Dependent Toileting: Not assessed Toilet Transfer: Not assessed   Skilled Interventions: Pt greeted at time of session reclined in bed resting with wife present. Discussed role and purpose of OT with pt and family. Note pt lethargic/sleepy with difficulty following commands throughout session even "wash your face" or sit up. Max difficulty with sequencing, following commands, and problem solving throughout session. Noted facial grimacing with some movement but denied pain. Able to sit EOB with Mod/Max A to achieve with chuck pad and sitting EOB, noted to have bleeding from heparin shot, RN re-entered room and dressed bleeding area. Attempted to have pt bathe, but unable to follow commands to do so despite noxious stimuli with placing washcloth on face. Insistant to return to supine, did so Mod A. Donned pants bed level dependent to thread and roll to get over hips. Wife present for all, attempting to encourage pt to participate. RN aware of lethargy/decreased alertness and lack of participation.  Left bed level resting with alarm on call bell in reach.    Discharge Criteria: Patient will be discharged from OT if patient refuses treatment 3 consecutive times without medical reason, if treatment goals not met, if there is a change in medical status, if patient makes no progress towards  goals or if patient is discharged from hospital.  The above assessment, treatment plan, treatment alternatives and goals were discussed and mutually agreed upon: by patient and by family  Viona Gilmore 06/24/2021, 4:53 PM

## 2021-06-24 NOTE — Progress Notes (Signed)
Physical Therapy Session Note  Patient Details  Name: Todd Duran MRN: [id number] Date of Birth: Apr 11, 1946  Today's Date: 06/24/2021 PT Individual Time: 1420-1455 PT Individual Time Calculation (min): 35 min   Short Term Goals: Week 1:  PT Short Term Goal 1 (Week 1): Pt will perform supine<>sit with CGA PT Short Term Goal 2 (Week 1): Pt will perform bed<>chair transfers using LRAD with CGA PT Short Term Goal 3 (Week 1): Pt will perform car transfer with min assist PT Short Term Goal 4 (Week 1): Pt will manage w/c parts and setup for transfer to bed with only min cuing PT Short Term Goal 5 (Week 1): Pt will propel w/c at least 224ft with supervision  Skilled Therapeutic Interventions/Progress Updates: Pt presented in bed with SO present. PTA verbally discussed pt's status with primary PT prior to session. Pt noted to be lethargic and would repeat things prior during session as if working on comprehension. Per SO pt noted to have increased confusion and lethargy and current status was not baseline even in acute setting. PTA encouraged pt to sit EOB, pt was able to initiate movement however was unable to complete task. Pt returned to supine and PTA discussed pt's current status with Juanita Craver, LPN. Per nurse pt has been like this all day and all labs/vitals have been WML. Juanita Craver did advise she would speak with PA. PTA returned to room and discussed with SO. Jesusita Oka, PA arrived shortly thereafter and pt was slightly more verbal and was able to sit up EOB with CGA and use of bed features.  Per Jesusita Oka pt noted to only be lethargic and did not require any additional work-ups at this time. Pt was able to don scrub top with minA as had difficulty sequencing head through top. Pt returned to supine shortly after and was able to reposition back to middle of bed with supervision. With encouragement and cues pt was able to perform RLE SLR x 5 then seemed to be nodding off. PTA placed blankets on pt and left with call bell  within reach and bed alarm on.      Therapy Documentation Precautions:  Precautions Precautions: Fall, Other (comment) Precaution Comments: L AKA (05/20/2021), R BKA (06/21/2021) Restrictions Weight Bearing Restrictions: Yes RLE Weight Bearing: Non weight bearing LLE Weight Bearing: Non weight bearing General: PT Amount of Missed Time (min): 40 Minutes PT Missed Treatment Reason: Patient fatigue Vital Signs: Therapy Vitals Temp: 99.1 F (37.3 C) Pulse Rate: 86 Resp: 18 BP: 123/72 Patient Position (if appropriate): Lying Oxygen Therapy SpO2: 100 % O2 Device: Room Air Pain:   Mobility:   Locomotion :    Trunk/Postural Assessment :    Balance:   Exercises:   Other Treatments:      Therapy/Group: Individual Therapy  Maliah Pyles 06/24/2021, 3:03 PM

## 2021-06-24 NOTE — Anesthesia Postprocedure Evaluation (Signed)
Anesthesia Post Note  Patient: Todd Duran  Procedure(s) Performed: RIGHT BELOW KNEE AMPUTATION (Right: Knee)     Patient location during evaluation: PACU Anesthesia Type: General Level of consciousness: awake and alert Pain management: pain level controlled Vital Signs Assessment: post-procedure vital signs reviewed and stable Respiratory status: spontaneous breathing, nonlabored ventilation, respiratory function stable and patient connected to nasal cannula oxygen Cardiovascular status: blood pressure returned to baseline and stable Postop Assessment: no apparent nausea or vomiting Anesthetic complications: no   No notable events documented.  Last Vitals:  Vitals:   06/23/21 1411 06/23/21 1540  BP: 115/72 110/69  Pulse: 84 86  Resp: 11 13  Temp: 36.6 C 36.6 C  SpO2: 98% 99%    Last Pain:  Vitals:   06/23/21 1718  TempSrc:   PainSc: 8                  [name]

## 2021-06-24 NOTE — Discharge Instructions (Signed)
Inpatient Rehab Discharge Instructions  Todd Duran Discharge date and time: No discharge date for patient encounter.   Activities/Precautions/ Functional Status: Activity: activity as tolerated Diet: diabetic diet Wound Care: Routine skin checks Functional status:  ___ No restrictions     ___ Walk up steps independently ___ 24/7 supervision/assistance   ___ Walk up steps with assistance ___ Intermittent supervision/assistance  ___ Bathe/dress independently ___ Walk with walker     _x__ Bathe/dress with assistance ___ Walk Independently    ___ Shower independently ___ Walk with assistance    ___ Shower with assistance ___ No alcohol     ___ Return to work/school ________  Special Instructions: No driving smoking or alcohol   My questions have been answered and I understand these instructions. I will adhere to these goals and the provided educational materials after my discharge from the hospital.  Patient/Caregiver Signature _______________________________ Date __________  Clinician Signature _______________________________________ Date __________  Please bring this form and your medication list with you to all your follow-up doctor's appointments.

## 2021-06-24 NOTE — Progress Notes (Signed)
[name], MD   Physician  Physical Medicine and Rehabilitation  PMR Pre-admission      Signed  Date of Service:  06/22/2021  3:04 PM       Related encounter: Admission (Discharged) from 06/05/2021 in [facility] 4E CV SURGICAL PROGRESSIVE CARE       Signed      Show:Clear all _0 Written_1 Templated_2 Copied  Added by: _3 Cristina Gong, RN_4 [name], MD  _5 Hover for details                                                                                                                                                                                                                                                                                                                                                                                                                                                       PMR Admission Coordinator Pre-Admission Assessment   Patient: Todd Duran is an 75 y.o., male MRN: [id number] DOB: 01/06/1946 Height: _6  (180.3 cm) Weight: 56.5 kg   Insurance Information   PRIMARY: [facility]      Policy#: [id number]      Subscriber: pt CM Name: [facility]      Phone#: [phone]     Fax#: [phone] Pre-Cert#: [id number]   approved for 30 days   Employer:  Benefits:  Phone #: [phone]      Eff. Date: active     Deduct:       Out of Pocket Max:       Life Max:  CIR: Per VA contract       Providers: VA network  SECONDARY:       Policy#:      Phone#:    Development worker, community:       Phone#:    The Engineer, petroleum" for patients in Inpatient Rehabilitation Facilities with attached "Privacy Act [place] Records" was provided and verbally reviewed with: N/A   Emergency Contact Information Contact Information       Name Relation Home  Work Mobile    Golden Spouse     [phone]    [name] Sister     [phone]    Ferguson, Gertner     [phone]           Current Medical History  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 Presented 05/12/2021 with progressive left foot pain for 2 months with ischemic changes and findings of decreased femoral pulses bilaterally. Underwent ultrasound guided left femoral artery with abdominal aortogram showing bilateral renal artery stenosis. 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 He was initially admitted to inpatient acute rehab services on 05/15/2021.   Close monitoring of BKA site noted to be boggy necrotic area concerning along incision line with vascular surgery notified for follow up. He was discharged to acute services on 05/20/2021 and underwent left AKA by Dr Carlis Abbott. He was readmitted to inpatient rehab services on 05/25/2021.   He was doing well with rehab services and left AKA was doing well, however noted ischemic changes to the right fifth toe with ulceration and felt that amputation was imminent. Patient discharged to acute services on 06/05/2021.   He underwent right iliofemoral enterctomy with Bovin patch angioplasty as well as right fifth toe amputation including metatarsal head by Dr Trula Slade. He was NWB. Patient with persistent ischemic changes on the dorsum of the right foot following hi bypass on 06/10/2021. 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 He has been cleared to begin SQ heparin for DVT prophylaxis. Acute blood loss anemia to be monitored. Hgb 6.7 8/16 and transfused.   Patient's medical record from [facility]  has been reviewed by the rehabilitation admission coordinator and physician.   Past Medical History       Past Medical History:  Diagnosis Date   Diabetes mellitus without complication ([place])     Hypertension        Family History   family history is not on file.   Prior Rehab/Hospitalizations Has the patient had prior rehab or hospitalizations prior to admission? Yes CIR twice 05/2021 and returned to acute hospital   Has the patient had major surgery during 100 days prior to admission? Yes              Current Medications   Current Facility-Administered Medications:    0.9 %  sodium chloride infusion, 500 mL, Intravenous, Once PRN, Setzer, Edman Circle, PA-C   acetaminophen (TYLENOL) tablet 325-650 mg, 325-650 mg, Oral, Q4H PRN, 650 mg at 06/16/21 1653 **OR** acetaminophen (TYLENOL) suppository 325-650 mg, 325-650 mg, Rectal, Q4H PRN, Vaughan Basta, Edman Circle,  PA-C   alum & mag hydroxide-simeth (MAALOX/MYLANTA) 200-200-20 MG/5ML suspension 15-30 mL, 15-30 mL, Oral, Q2H PRN, Setzer, Edman Circle, PA-C   amLODipine (NORVASC) tablet 10 mg, 10 mg, Oral, Daily, Setzer, Edman Circle, PA-C, 10 mg at 06/23/21 0848   aspirin EC tablet 81 mg, 81 mg, Oral, Daily, Barbie Banner, PA-C, 81 mg at 06/23/21 0847   atenolol (TENORMIN) tablet 25 mg, 25 mg, Oral, BID, Setzer, Edman Circle, PA-C, 25 mg at 06/23/21 0847   atorvastatin (LIPITOR) tablet 40 mg, 40 mg, Oral, QHS, Setzer, Edman Circle, PA-C, 40 mg at 06/22/21 2129   clopidogrel (PLAVIX) tablet 75 mg, 75 mg, Oral, Daily, Serafina Mitchell, MD, 75 mg at 06/23/21 0847   docusate sodium (COLACE) capsule 100 mg, 100 mg, Oral, Daily, Setzer, Edman Circle, PA-C, 100 mg at 06/23/21 0848   ferrous sulfate tablet 325 mg, 325 mg, Oral, Q breakfast, Setzer, Edman Circle, PA-C, 325 mg at 02/58/52 7782   folic acid (FOLVITE) tablet 1 mg, 1 mg, Oral, Daily, Setzer, Sandra J, PA-C, 1 mg at 06/23/21 0848   gabapentin (NEURONTIN) capsule 100 mg, 100 mg, Oral, BID, Setzer, Edman Circle, PA-C, 100 mg at 06/23/21 0847   guaiFENesin-dextromethorphan (ROBITUSSIN DM) 100-10 MG/5ML syrup 15 mL, 15 mL,  Oral, Q4H PRN, Setzer, Edman Circle, PA-C   heparin injection 5,000 Units, 5,000 Units, Subcutaneous, Q8H, Setzer, Edman Circle, PA-C, 5,000 Units at 06/23/21 1415   hydrALAZINE (APRESOLINE) injection 5 mg, 5 mg, Intravenous, Q20 Min PRN, Setzer, Sandra J, PA-C   insulin aspart (novoLOG) injection 0-15 Units, 0-15 Units, Subcutaneous, TID WC, Setzer, Edman Circle, PA-C, 2 Units at 06/23/21 1203   labetalol (NORMODYNE) injection 10 mg, 10 mg, Intravenous, Q10 min PRN, Setzer, Edman Circle, PA-C   magnesium sulfate IVPB 2 g 50 mL, 2 g, Intravenous, Daily PRN, Setzer, Sandra J, PA-C   metFORMIN (GLUCOPHAGE) tablet 500 mg, 500 mg, Oral, Q breakfast, Setzer, Edman Circle, PA-C, 500 mg at 06/23/21 0847   metoprolol tartrate (LOPRESSOR) injection 2-5 mg, 2-5 mg, Intravenous, Q2H PRN, Setzer, Edman Circle, PA-C   morphine 2 MG/ML injection 2-5 mg, 2-5 mg, Intravenous, Q1H PRN, Barbie Banner, PA-C, 2 mg at 06/06/21 0547   multivitamin with minerals tablet 1 tablet, 1 tablet, Oral, Daily, Setzer, Edman Circle, PA-C, 1 tablet at 06/23/21 0847   ondansetron (ZOFRAN) injection 4 mg, 4 mg, Intravenous, Q6H PRN, Setzer, Edman Circle, PA-C   oxybutynin [facility]) tablet 2.5 mg, 2.5 mg, Oral, BID, Setzer, Edman Circle, PA-C, 2.5 mg at 06/23/21 4235   oxyCODONE-acetaminophen (PERCOCET/ROXICET) 5-325 MG per tablet 1-2 tablet, 1-2 tablet, Oral, Q4H PRN, Barbie Banner, PA-C, 2 tablet at 06/23/21 1022   pantoprazole (PROTONIX) EC tablet 40 mg, 40 mg, Oral, Daily, Setzer, Edman Circle, PA-C, 40 mg at 06/23/21 0847   phenol (CHLORASEPTIC) mouth spray 1 spray, 1 spray, Mouth/Throat, PRN, Setzer, Edman Circle, PA-C   polyethylene glycol (MIRALAX / GLYCOLAX) packet 17 g, 17 g, Oral, Daily PRN, Barbie Banner, PA-C, 17 g at 06/12/21 3614   Patients Current Diet:  Diet Order                  Diet heart healthy/carb modified Room service appropriate? Yes; Fluid consistency: Thin  Diet effective now                         Precautions /  Restrictions Precautions Precautions: Fall Precaution Comments: L aka, R BKA (8/14) Other Brace:  R darco shoe Restrictions Weight Bearing Restrictions: Yes RLE Weight Bearing: Non weight bearing LLE Weight Bearing: Non weight bearing Other Position/Activity Restrictions: no formal orders for RLE in chart though darco shoe noted in room. Assumed WB through heel (RN 8/5 recommended RLE NWB due to high risk for BKA/AKA.)    Has the patient had 2 or more falls or a fall with injury in the past year? No   Prior Activity Level Community (5-7x/wk): Mod I with RW   Prior Functional Level Self Care: Did the patient need help bathing, dressing, using the toilet or eating? Independent   Indoor Mobility: Did the patient need assistance with walking from room to room (with or without device)? Independent   Stairs: Did the patient need assistance with internal or external stairs (with or without device)? Independent   Functional Cognition: Did the patient need help planning regular tasks such as shopping or remembering to take medications? Independent   Home Assistive Devices / Equipment Home Assistive Devices/Equipment: Prosthesis, Wheelchair, Bedside commode/3-in-1 Home Equipment: None   Prior Device Use: Indicate devices/aids used by the patient prior to current illness, exacerbation or injury? None of the above   Current Functional Level Cognition   Overall Cognitive Status: No family/caregiver present to determine baseline cognitive functioning Orientation Level: Oriented X4 Following Commands: Follows one step commands consistently Safety/Judgement: Decreased awareness of safety General Comments: patient pleasant    Extremity Assessment (includes Sensation/Coordination)   Upper Extremity Assessment: Overall WFL for tasks assessed  Lower Extremity Assessment: Defer to PT evaluation RLE Deficits / Details: pt able to initiate SLR in bed, would not do ankle pump due to tightness and  pain LLE Deficits / Details: pt with good hip ROM in all directions     ADLs   Overall ADL's : Needs assistance/impaired Eating/Feeding: Set up, Sitting Grooming: Set up, Sitting, Oral care Grooming Details (indicate cue type and reason): seated in recliner, able to bend forward and reach appropriately Upper Body Bathing: Sitting, Min guard Lower Body Bathing: Sitting/lateral leans, Maximal assistance Lower Body Bathing Details (indicate cue type and reason): unable to access LLE foot at this time, can access down to knee Upper Body Dressing : Set up, Sitting Lower Body Dressing: Sitting/lateral leans, Maximal assistance Lower Body Dressing Details (indicate cue type and reason): unable to access LLE foot at this time, can access down to knee Toilet Transfer: Moderate assistance, Stand-pivot, Cueing for safety, Cueing for sequencing, BSC, RW Toilet Transfer Details (indicate cue type and reason): to recliner, bed height elevated and mod A to power up to standing. patient able to pivot and take small hop on R foot however poor eccentric control into chair needing heavy mod A to safely sit into recliner Toileting- Clothing Manipulation and Hygiene: Maximal assistance, Sitting/lateral lean Functional mobility during ADLs: Moderate assistance, Cueing for safety, Cueing for sequencing, Rolling walker General ADL Comments: Discussed BSC transfers practice with pt in next session. able able to demo lateral leans well, likely requiring mostly Min A for LB ADls     Mobility   Overal bed mobility: Modified Independent Bed Mobility: Sit to Supine Supine to sit: Modified independent (Device/Increase time), HOB elevated Sit to supine: Modified independent (Device/Increase time) General bed mobility comments: patient able to reposition in bed well     Transfers   Overall transfer level: Needs assistance Equipment used: None Transfers: Lateral/Scoot Transfers Sit to Stand: Min  assist Anterior-Posterior transfers: Min assist, +2 safety/equipment, From elevated surface  Lateral/Scoot Transfers: Min  guard General transfer comment: patient able to scoot from recliner to bed with min guard/supervision. Brought in slide board as he was going uphill, but he did not need.     Ambulation / Gait / Stairs / Emergency planning/management officer   Ambulation/Gait General Gait Details: unable this date Product manager mobility: Yes Wheelchair propulsion: Both upper extremities Wheelchair parts: Supervision/cueing Distance: 400 Wheelchair Assistance Details (indicate cue type and reason): vcs for use of brakes, pt propelled chair and performs turns 180 degrees with smooth technique, min cues for environmental awareness/safety     Posture / Balance Dynamic Sitting Balance Sitting balance - Comments: does well with supervision for rotating body in bed pre/post scoot transfer Balance Overall balance assessment: Needs assistance Sitting-balance support: Feet unsupported, No upper extremity supported Sitting balance-Leahy Scale: Good Sitting balance - Comments: does well with supervision for rotating body in bed pre/post scoot transfer Postural control: Posterior lean Standing balance support: Bilateral upper extremity supported Standing balance-Leahy Scale: Poor Standing balance comment: unable     Special needs/care consideration      Previous Home Environment  Living Arrangements: Spouse/significant other  Lives With: Spouse, Family Available Help at Discharge: Available 24 hours/day Type of Home: House Home Layout: Multi-level Alternate Level Stairs-Number of Steps: Reports his bed/bath in downstairs, Home Access: Stairs to enter Entrance Stairs-Rails: Right, Left Entrance Stairs-Number of Steps: 4 Bathroom Shower/Tub: Chiropodist: Standard Bathroom Accessibility: Yes How Accessible: Accessible via walker Home Care Services: No Additional  Comments: pt was d/c'd from CIR to have surgery, plan is to return. Pt lives with spouse typically who can provide 24/7 assist.   Discharge Living Setting Plans for Discharge Living Setting: Patient's home, Lives with (comment) Type of Home at Discharge: House Discharge Home Layout: Multi-level Alternate Level Stairs-Number of Steps: 4 steps to bedroom Discharge Home Access: Stairs to enter Entrance Stairs-Rails: Right, Left, Can reach both Entrance Stairs-Number of Steps: 4 Discharge Bathroom Shower/Tub: Tub/shower unit Discharge Bathroom Toilet: Standard Discharge Bathroom Accessibility: Yes How Accessible: Accessible via walker Does the patient have any problems obtaining your medications?: No   Social/Family/Support Systems Patient Roles: Spouse Contact Information: wife Anticipated Caregiver: Wife Anticipated Caregiver's Contact Information: see above Ability/Limitations of Caregiver: wife taking fmla as needed Caregiver Availability: 24/7 Discharge Plan Discussed with Primary Caregiver: Yes Is Caregiver In Agreement with Plan?: Yes Does Caregiver/Family have Issues with Lodging/Transportation while Pt is in Rehab?: No   Goals Patient/Family Goal for Rehab: Mod I to supervision with PT at wheelchair level and supervision to min wiht OT at wheelchair level Expected length of stay: ELOS 2 weeks Pt/Family Agrees to Admission and willing to participate: Yes Program Orientation Provided & Reviewed with Pt/Caregiver Including Roles  & Responsibilities: Yes   Decrease burden of Care through IP rehab admission: n/a   Possible need for SNF placement upon discharge: not anticipated   Patient Condition: I have reviewed medical records from [facility] , spoken with CM, and patient and spouse. I met with patient at the bedside for inpatient rehabilitation assessment.  Patient will benefit from ongoing PT and OT, can actively participate in 3 hours of therapy a day 5 days of the  week, and can make measurable gains during the admission.  Patient will also benefit from the coordinated team approach during an Inpatient Acute Rehabilitation admission.  The patient will receive intensive therapy as well as Rehabilitation physician, nursing, social worker, and care management interventions.  Due to bladder management, bowel management, safety,  skin/wound care, disease management, medication administration, pain management, and patient education the patient requires 24 hour a day rehabilitation nursing.  The patient is currently min guard assist with mobility and basic ADLs.  Discharge setting and therapy post discharge at home with home health is anticipated.  Patient has agreed to participate in the Acute Inpatient Rehabilitation Program and will admit today.   Preadmission Screen Completed By:  Cleatrice Burke, 06/23/2021 3:48 PM ______________________________________________________________________   Discussed status with Dr. Naaman Plummer on  06/23/2021 at 1549 and received approval for admission today.   Admission Coordinator:  Cleatrice Burke, RN, time 3744 Date 06/23/2021    Assessment/Plan: Diagnosis: Right bka, prior left aka Does the need for close, 24 hr/day Medical supervision in concert with the patient's rehab needs make it unreasonable for this patient to be served in a less intensive setting? Yes Co-Morbidities requiring supervision/potential complications: DM, HTN, PAD Due to bladder management, bowel management, safety, skin/wound care, disease management, medication administration, pain management, and patient education, does the patient require 24 hr/day rehab nursing? Yes Does the patient require coordinated care of a physician, rehab nurse, PT, OT to address physical and functional deficits in the context of the above medical diagnosis(es)? Yes Addressing deficits in the following areas: balance, endurance, locomotion, strength, transferring,  bowel/bladder control, bathing, dressing, feeding, grooming, toileting, and psychosocial support Can the patient actively participate in an intensive therapy program of at least 3 hrs of therapy 5 days a week? Yes The potential for patient to make measurable gains while on inpatient rehab is excellent Anticipated functional outcomes upon discharge from inpatient rehab: modified independent and supervision PT, modified independent, supervision, and min assist OT, n/a SLP Estimated rehab length of stay to reach the above functional goals is: 1-2 weeks Anticipated discharge destination: Home 10. Overall Rehab/Functional Prognosis: excellent     MD Signature: [name], MD, [facility] 06/23/2021          Revision History                          Note Details  Author [name], MD File Time 06/23/2021  4:14 PM  Author Type Physician Status Signed  Last Editor [name], MD Service Physical Medicine and [place] # [phone] Admit Date 06/23/2021

## 2021-06-25 DIAGNOSIS — I739 Peripheral vascular disease, unspecified: Secondary | ICD-10-CM | POA: Diagnosis not present

## 2021-06-25 DIAGNOSIS — Z89612 Acquired absence of left leg above knee: Secondary | ICD-10-CM

## 2021-06-25 DIAGNOSIS — D62 Acute posthemorrhagic anemia: Secondary | ICD-10-CM | POA: Diagnosis not present

## 2021-06-25 DIAGNOSIS — Z89511 Acquired absence of right leg below knee: Secondary | ICD-10-CM

## 2021-06-25 DIAGNOSIS — E871 Hypo-osmolality and hyponatremia: Secondary | ICD-10-CM

## 2021-06-25 LAB — GLUCOSE, CAPILLARY
Glucose-Capillary: 97 mg/dL (ref 70–99)
Glucose-Capillary: 134 mg/dL — ABNORMAL HIGH (ref 70–99)
Glucose-Capillary: 87 mg/dL (ref 70–99)
Glucose-Capillary: 125 mg/dL — ABNORMAL HIGH (ref 70–99)

## 2021-06-25 NOTE — Progress Notes (Signed)
Occupational Therapy Session Note  Patient Details  Name: Todd Duran MRN: [id number] Date of Birth: 08/27/1946  Today's Date: 06/25/2021 OT Individual Time: 1350-1430 OT Individual Time Calculation (min): 40 min  and Today's Date: 06/25/2021 OT Missed Time: 35 Minutes Missed Time Reason: Patient fatigue   Short Term Goals: Week 1:  OT Short Term Goal 1 (Week 1): Pt will complete LB dressing with Mod A and use of AE PRN OT Short Term Goal 2 (Week 1): Pt will complete BSC/toilet transfer with Mod A OT Short Term Goal 3 (Week 1): Pt will sit EOB with no more than CGA for ADL routine OT Short Term Goal 4 (Week 1): Pt will self feed with Supervision with good alertness and no more than Min cues to attend  Skilled Therapeutic Interventions/Progress Updates:    Pt greeted in care of nursing after they had just got him to the BSC for BM. Per nursing, the transfer did not go well. OT discussed use of bed pan for now until transfers improve. Wokred on rolling L and R with pt more lethargic this afternoon and having more difficulty following commands than in earlier OT session. Pt reported being tired from BSC transfers but agreeable to bed level exercises. R LE amputee exercises with quad set, knee extension, and hip adduction/abduction. Pt with difficulty understanding directions for exercises. Even after he would get technqiue right, he would have difficulty replicating and intermittently closing his eyes. OT had pt lay flat in supine for 5 minute hip stretch. OT obtained wide drop arm BSC to work on BSC transfers with and made a handout with a picture of AP transfer for nursing to best assist pt.  Pt too fatigued to participate further in OT session and missed 35 minutes of treatment. Pt left semi-reclined in bed with needs met.   Therapy Documentation Precautions:  Precautions Precautions: Fall Precaution Comments: L AKA (05/20/2021), R BKA (06/21/2021), reduced cognition Restrictions Weight  Bearing Restrictions: Yes RLE Weight Bearing: Non weight bearing LLE Weight Bearing: Non weight bearing General: General OT Amount of Missed Time: 35 Minutes Pain:  Denies pain    Therapy/Group: Individual Therapy   S  06/25/2021, 2:44 PM 

## 2021-06-25 NOTE — Progress Notes (Signed)
PROGRESS NOTE   Subjective/Complaints: Pt c/o of pain in stump along posterior aspect at times. Bothers him when he's putting pressure on stump during transfers. Overall is feeling better  ROS: Patient denies fever, rash, sore throat, blurred vision, nausea, vomiting, diarrhea, cough, shortness of breath or chest pain,  headache, or mood change.    Objective:   No results found. Recent Labs    06/24/21 0518 06/24/21 1822  WBC 11.2* 13.4*  HGB 7.9* 8.1*  HCT 25.3* 25.8*  PLT 604* 635*   Recent Labs    06/24/21 0518 06/24/21 1822  NA 130* 128*  K 4.3 4.5  CL 99 96*  CO2 23 23  GLUCOSE 122* 128*  BUN 21 23  CREATININE 0.97 1.01  CALCIUM 9.4 9.3    Intake/Output Summary (Last 24 hours) at 06/25/2021 1534 Last data filed at 06/25/2021 1300 Gross per 24 hour  Intake 350 ml  Output 400 ml  Net -50 ml     Pressure Injury 06/05/21 Coccyx Mid;Other (Comment) Stage 2 -  Partial thickness loss of dermis presenting as a shallow open injury with a red, pink wound bed without slough. (Active)  06/05/21 2200  Location: Coccyx  Location Orientation: Mid;Other (Comment) (coccyx)  Staging: Stage 2 -  Partial thickness loss of dermis presenting as a shallow open injury with a red, pink wound bed without slough.  Wound Description (Comments):   Present on Admission: Yes    Physical Exam: Vital Signs Blood pressure 113/90, pulse 94, temperature 98 F (36.7 C), resp. rate 16, height 4\' 5"  (1.346 m), weight 52.3 kg, SpO2 96 %.  General: Alert and oriented x 3, No apparent distress HEENT: Head is normocephalic, atraumatic, PERRLA, EOMI, sclera anicteric, oral mucosa pink and moist, dentition intact, ext ear canals clear,  Neck: Supple without JVD or lymphadenopathy Heart: Reg rate and rhythm. No murmurs rubs or gallops Chest: CTA bilaterally without wheezes, rales, or rhonchi; no distress Abdomen: Soft, non-tender,  non-distended, bowel sounds positive. Extremities: No clubbing, cyanosis, or edema. Pulses are 2+ Psych: Pt's affect is appropriate. Pt is cooperative Skin: right BK well approximated, minimal drainage.  Neuro:  Alert and oriented x 3. Normal insight and awareness. Intact Memory. Normal language and speech. Cranial nerve exam unremarkable. Sometimes is delayed in processing. UE 5/5. LLE 4/5. RLE remains limited d/t pain Musculoskeletal: ongoing pain and swelling in right stump. Left stump minimally tender    Assessment/Plan: 1. Functional deficits which require 3+ hours per day of interdisciplinary therapy in a comprehensive inpatient rehab setting. Physiatrist is providing close team supervision and 24 hour management of active medical problems listed below. Physiatrist and rehab team continue to assess barriers to discharge/monitor patient progress toward functional and medical goals  Care Tool:  Bathing    Body parts bathed by patient:  (unable to bathe at eval d/t cognition)   Body parts bathed by helper: Front perineal area Body parts n/a: Left lower leg   Bathing assist Assist Level: Supervision/Verbal cueing     Upper Body Dressing/Undressing Upper body dressing   What is the patient wearing?: Hospital gown only    Upper body assist Assist Level:  (unable to  dress at eval d/t cognition)    Lower Body Dressing/Undressing Lower body dressing      What is the patient wearing?: Pants     Lower body assist Assist for lower body dressing: Dependent - Patient 0%     Toileting Toileting Toileting Activity did not occur (Clothing management and hygiene only): N/A (no void or bm)  Toileting assist Assist for toileting:  (did not assess at eval)     Transfers Chair/bed transfer  Transfers assist  Chair/bed transfer activity did not occur: Safety/medical concerns  Chair/bed transfer assist level: Minimal Assistance - Patient > 75%      Locomotion Ambulation   Ambulation assist   Ambulation activity did not occur: Safety/medical concerns  Assist level: Minimal Assistance - Patient > 75%       Walk 10 feet activity   Assist  Walk 10 feet activity did not occur: Safety/medical concerns        Walk 50 feet activity   Assist Walk 50 feet with 2 turns activity did not occur: Safety/medical concerns         Walk 150 feet activity   Assist Walk 150 feet activity did not occur: Safety/medical concerns         Walk 10 feet on uneven surface  activity   Assist Walk 10 feet on uneven surfaces activity did not occur: Safety/medical concerns         Wheelchair     Assist Will patient use wheelchair at discharge?: Yes Type of Wheelchair: Manual Wheelchair activity did not occur: Safety/medical concerns  Wheelchair assist level: Set up assist, Supervision/Verbal cueing Max wheelchair distance: 146ft    Wheelchair 50 feet with 2 turns activity    Assist    Wheelchair 50 feet with 2 turns activity did not occur: Safety/medical concerns   Assist Level: Supervision/Verbal cueing   Wheelchair 150 feet activity     Assist  Wheelchair 150 feet activity did not occur: Safety/medical concerns   Assist Level: Supervision/Verbal cueing   Blood pressure 113/90, pulse 94, temperature 98 F (36.7 C), resp. rate 16, height 4\' 5"  (1.346 m), weight 52.3 kg, SpO2 96 %.  Medical Problem List and Plan: 1.  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  Antithrombotics: -DVT/anticoagulation: Subcutaneous heparin Pharmaceutical: Heparin             -antiplatelet therapy: Aspirin 81 mg daily and Plavix 75 mg daily 3. Pain Management: Neurontin 100 mg  twice daily, oxycodone as needed 4. Mood: Provide emotional support             -antipsychotic agents: N/A 5. Neuropsych: This patient is capable of making decisions on his own behalf. 6. Skin/Wound Care: Routine skin checks             -shrinker LLE             -limb guard RLE with dressing in place 7. Fluids/Electrolytes/Nutrition: Routine in and outs with follow-up chemistries 8.  Acute blood loss anemia.  Continue iron supplement.  hgb 8.1 ---recheck Friday 9.  Diabetes mellitus with peripheral neuropathy.  Hemoglobin A1c 5.2.  Glucophage 500 mg daily 10.  Hypertension.  Norvasc 10 mg daily, Tenormin 25 mg twice daily.  Monitor with increased mobility 11.  Hyperlipidemia.  Lipitor 12.  History of alcohol tobacco use.  Provide counseling 13. Hyponatremia: persistent, sl worsened  -add mild FR  -unclear etiology at present 14. Leukocytosis: 13k--reactive. No s/s infection at present. F/u tomorrow    LOS: 2 days A FACE TO FACE EVALUATION WAS PERFORMED  Ranelle Oyster 06/25/2021, 3:34 PM

## 2021-06-25 NOTE — Progress Notes (Signed)
Occupational Therapy Session Note  Patient Details  Name: Todd Duran MRN: [id number] Date of Birth: [birth year]  Today's Date: 06/25/2021 OT Individual Time: 3086-5784 OT Individual Time Calculation (min): 58 min    Short Term Goals: Week 1:  OT Short Term Goal 1 (Week 1): Pt will complete LB dressing with Mod A and use of AE PRN OT Short Term Goal 2 (Week 1): Pt will complete BSC/toilet transfer with Mod A OT Short Term Goal 3 (Week 1): Pt will sit EOB with no more than CGA for ADL routine OT Short Term Goal 4 (Week 1): Pt will self feed with Supervision with good alertness and no more than Min cues to attend  Skilled Therapeutic Interventions/Progress Updates:    Pt greeted semi-reclined in bed and agreeable to OT Treatment session. Pt declined LB bathing stating nursing washed his lower body earlier today. Worked on rolling to don brief (did not have one on) as well as don pants. PT needed OT assist to thread pant legs, then min A for rolling to pull up pants. OT provided verbal and visual demonstration of AP transfer, but pt continued to try to sit at EOB. Dr. Naaman Plummer entered room and helped direct pt to 90 degree turn to back into wc using AP Transfer. Pt was eventually able to complete AP transfer with moderate multimodal cues and min A. UB bathing/dressing from wc at the sink with overall min A. Min A to reach forward and collect items 2/2 core weakness. OT placed extra padding on R amputee pad. Pt left seated in wc with alarm belt on, call bell in reach, and needs met.   Therapy Documentation Precautions:  Precautions Precautions: Fall Precaution Comments: L AKA (05/20/2021), R BKA (06/21/2021), reduced cognition Restrictions Weight Bearing Restrictions: Yes RLE Weight Bearing: Non weight bearing LLE Weight Bearing: Non weight bearing Pain: Pain Assessment Pain Scale: 0-10 Pain Score: Asleep Faces Pain Scale: Hurts little more Pain Type: Acute pain Pain Location: Knee Pain  Orientation: Right Pain Descriptors / Indicators: Aching;Burning Pain Frequency: Constant Pain Onset: On-going Patients Stated Pain Goal: 2 Pain Intervention(s): Medication (See eMAR);Repositioned   Therapy/Group: Individual Therapy  Valma Cava 06/25/2021, 9:17 AM

## 2021-06-25 NOTE — Progress Notes (Signed)
Physical Therapy Session Note  Patient Details  Name: Todd Duran MRN: [id number] Date of Birth: 1946/03/13  Today's Date: 06/25/2021 PT Missed Time: 60 Minutes Missed Time Reason: Patient fatigue  Short Term Goals: Week 1:  PT Short Term Goal 1 (Week 1): Pt will perform supine<>sit with CGA PT Short Term Goal 2 (Week 1): Pt will perform bed<>chair transfers using LRAD with CGA PT Short Term Goal 3 (Week 1): Pt will perform car transfer with min assist PT Short Term Goal 4 (Week 1): Pt will manage w/c parts and setup for transfer to bed with only min cuing PT Short Term Goal 5 (Week 1): Pt will propel w/c at least 276ft with supervision  Skilled Therapeutic Interventions/Progress Updates:     Pt reports that he is too tired to participate in therapy at this time. Requests to defer until tomorrow. PT will follow up as able.  Therapy Documentation Precautions:  Precautions Precautions: Fall Precaution Comments: L AKA (05/20/2021), R BKA (06/21/2021), reduced cognition Restrictions Weight Bearing Restrictions: Yes RLE Weight Bearing: Non weight bearing LLE Weight Bearing: Non weight bearing General: PT Amount of Missed Time (min): 60 Minutes PT Missed Treatment Reason: Patient fatigue    Therapy/Group: Individual Therapy  Beau Fanny, PT, DPT 06/25/2021, 3:35 PM

## 2021-06-26 DIAGNOSIS — D62 Acute posthemorrhagic anemia: Secondary | ICD-10-CM | POA: Diagnosis not present

## 2021-06-26 DIAGNOSIS — Z89612 Acquired absence of left leg above knee: Secondary | ICD-10-CM | POA: Diagnosis not present

## 2021-06-26 DIAGNOSIS — Z89511 Acquired absence of right leg below knee: Secondary | ICD-10-CM | POA: Diagnosis not present

## 2021-06-26 DIAGNOSIS — I739 Peripheral vascular disease, unspecified: Secondary | ICD-10-CM | POA: Diagnosis not present

## 2021-06-26 LAB — CBC
HCT: 24.2 % — ABNORMAL LOW (ref 39.0–52.0)
Hemoglobin: 8 g/dL — ABNORMAL LOW (ref 13.0–17.0)
MCH: 27.5 pg (ref 26.0–34.0)
MCHC: 33.1 g/dL (ref 30.0–36.0)
MCV: 83.2 fL (ref 80.0–100.0)
Platelets: 532 10*3/uL — ABNORMAL HIGH (ref 150–400)
RBC: 2.91 MIL/uL — ABNORMAL LOW (ref 4.22–5.81)
RDW: 17.1 % — ABNORMAL HIGH (ref 11.5–15.5)
WBC: 12.2 10*3/uL — ABNORMAL HIGH (ref 4.0–10.5)
nRBC: 0 % (ref 0.0–0.2)

## 2021-06-26 LAB — BASIC METABOLIC PANEL
Anion gap: 10 (ref 5–15)
BUN: 33 mg/dL — ABNORMAL HIGH (ref 8–23)
CO2: 22 mmol/L (ref 22–32)
Calcium: 9.1 mg/dL (ref 8.9–10.3)
Chloride: 96 mmol/L — ABNORMAL LOW (ref 98–111)
Creatinine, Ser: 1.32 mg/dL — ABNORMAL HIGH (ref 0.61–1.24)
GFR, Estimated: 57 mL/min — ABNORMAL LOW (ref >60)
Glucose, Bld: 204 mg/dL — ABNORMAL HIGH (ref 70–99)
Potassium: 4.1 mmol/L (ref 3.5–5.1)
Sodium: 128 mmol/L — ABNORMAL LOW (ref 135–145)

## 2021-06-26 LAB — GLUCOSE, CAPILLARY
Glucose-Capillary: 200 mg/dL — ABNORMAL HIGH (ref 70–99)
Glucose-Capillary: 151 mg/dL — ABNORMAL HIGH (ref 70–99)
Glucose-Capillary: 124 mg/dL — ABNORMAL HIGH (ref 70–99)
Glucose-Capillary: 159 mg/dL — ABNORMAL HIGH (ref 70–99)

## 2021-06-26 MED ORDER — SODIUM CHLORIDE 0.9 % IV SOLN: INTRAVENOUS | Status: DC

## 2021-06-26 NOTE — Progress Notes (Signed)
PROGRESS NOTE   Subjective/Complaints: Good appetite. Pain improving. Napping when I came in this am  ROS: Patient denies fever, rash, sore throat, blurred vision, nausea, vomiting, diarrhea, cough, shortness of breath or chest pain, headache, or mood change.    Objective:   No results found. Recent Labs    06/24/21 1822 06/26/21 0716  WBC 13.4* 12.2*  HGB 8.1* 8.0*  HCT 25.8* 24.2*  PLT 635* 532*   Recent Labs    06/24/21 1822 06/26/21 0716  NA 128* 128*  K 4.5 4.1  CL 96* 96*  CO2 23 22  GLUCOSE 128* 204*  BUN 23 33*  CREATININE 1.01 1.32*  CALCIUM 9.3 9.1    Intake/Output Summary (Last 24 hours) at 06/26/2021 1041 Last data filed at 06/26/2021 0700 Gross per 24 hour  Intake 190 ml  Output --  Net 190 ml     Pressure Injury 06/05/21 Coccyx Mid;Other (Comment) Stage 2 -  Partial thickness loss of dermis presenting as a shallow open injury with a red, pink wound bed without slough. (Active)  06/05/21 2200  Location: Coccyx  Location Orientation: Mid;Other (Comment) (coccyx)  Staging: Stage 2 -  Partial thickness loss of dermis presenting as a shallow open injury with a red, pink wound bed without slough.  Wound Description (Comments):   Present on Admission: Yes    Physical Exam: Vital Signs Blood pressure 100/65, pulse 84, temperature 98.1 F (36.7 C), resp. rate 16, height 4\' 5"  (1.346 m), weight 52.3 kg, SpO2 100 %.  Constitutional: No distress . Vital signs reviewed. HEENT: NCAT, EOMI, oral membranes moist Neck: supple Cardiovascular: RRR without murmur. No JVD    Respiratory/Chest: CTA Bilaterally without wheezes or rales. Normal effort    GI/Abdomen: BS +, non-tender, non-distended Ext: no clubbing, cyanosis, or edema Psych: pleasant and cooperative  Skin: right BK dressing removed, old blood on dressing. Wound itself CDI Neuro:  Alert and oriented x 3. Normal insight and awareness. Intact  Memory. Normal language and speech. Cranial nerve exam unremarkable. Sometimes is delayed in processing. UE 5/5. LLE 4/5. RLE remains limited somewhat still d/t pain Musculoskeletal: moderate pain and swelling in right stump with palpation. Left stump minimally tender    Assessment/Plan: 1. Functional deficits which require 3+ hours per day of interdisciplinary therapy in a comprehensive inpatient rehab setting. Physiatrist is providing close team supervision and 24 hour management of active medical problems listed below. Physiatrist and rehab team continue to assess barriers to discharge/monitor patient progress toward functional and medical goals  Care Tool:  Bathing    Body parts bathed by patient:  (unable to bathe at eval d/t cognition)   Body parts bathed by helper: Front perineal area Body parts n/a: Left lower leg   Bathing assist Assist Level: Supervision/Verbal cueing     Upper Body Dressing/Undressing Upper body dressing   What is the patient wearing?: Hospital gown only    Upper body assist Assist Level:  (unable to dress at eval d/t cognition)    Lower Body Dressing/Undressing Lower body dressing      What is the patient wearing?: Pants     Lower body assist Assist for lower body  dressing: Dependent - Patient 0%     Toileting Toileting Toileting Activity did not occur Press photographer and hygiene only): N/A (no void or bm)  Toileting assist Assist for toileting:  (did not assess at eval)     Transfers Chair/bed transfer  Transfers assist  Chair/bed transfer activity did not occur: Safety/medical concerns  Chair/bed transfer assist level: Minimal Assistance - Patient > 75%     Locomotion Ambulation   Ambulation assist   Ambulation activity did not occur: Safety/medical concerns  Assist level: Minimal Assistance - Patient > 75%       Walk 10 feet activity   Assist  Walk 10 feet activity did not occur: Safety/medical concerns         Walk 50 feet activity   Assist Walk 50 feet with 2 turns activity did not occur: Safety/medical concerns         Walk 150 feet activity   Assist Walk 150 feet activity did not occur: Safety/medical concerns         Walk 10 feet on uneven surface  activity   Assist Walk 10 feet on uneven surfaces activity did not occur: Safety/medical concerns         Wheelchair     Assist Will patient use wheelchair at discharge?: Yes Type of Wheelchair: Manual Wheelchair activity did not occur: Safety/medical concerns  Wheelchair assist level: Set up assist, Supervision/Verbal cueing Max wheelchair distance: 176ft    Wheelchair 50 feet with 2 turns activity    Assist    Wheelchair 50 feet with 2 turns activity did not occur: Safety/medical concerns   Assist Level: Supervision/Verbal cueing   Wheelchair 150 feet activity     Assist  Wheelchair 150 feet activity did not occur: Safety/medical concerns   Assist Level: Supervision/Verbal cueing   Blood pressure 100/65, pulse 84, temperature 98.1 F (36.7 C), resp. rate 16, height 4\' 5"  (1.346 m), weight 52.3 kg, SpO2 100 %.  Medical Problem List and Plan: 1.  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  Antithrombotics: -DVT/anticoagulation: Subcutaneous heparin Pharmaceutical: Heparin             -antiplatelet therapy: Aspirin 81 mg daily and Plavix 75 mg daily 3. Pain Management: Neurontin 100 mg twice daily, oxycodone as needed 4. Mood: Provide emotional support             -antipsychotic agents: N/A 5. Neuropsych: This patient is capable of making decisions on his own behalf. 6. Skin/Wound Care: Routine skin checks             -shrinker LLE             -right BKA  wound CDI, can wear shrinker alone. May need replacement shrinkers 7. Fluids/Electrolytes/Nutrition: Routine in and outs with follow-up chemistries 8.  Acute blood loss anemia.  Continue iron supplement.  hgb 8.1 --> 8.0 8/19  -continue supplementing  -8/19 has had one heme + stool during last admit. Recheck this weekend. May need GI consult ultimately 9.  Diabetes mellitus with peripheral neuropathy.  Hemoglobin A1c 5.2.  Glucophage 500 mg daily  CBG (last 3)  Recent Labs  06/25/21 1718 06/25/21 2211 06/26/21 0557  GLUCAP 87 125* 200*   8/19 cbg's fairly well controlled until this am---observe for consistent pattern 10.  Hypertension.  Norvasc 10 mg daily, Tenormin 25 mg twice daily.    8/19 controlled 11.  Hyperlipidemia.  Lipitor 12.  History of alcohol tobacco use.  Provide counseling 13. Hyponatremia: persistent, stable at 128 today  -added mild FR  -looks a little hypovolemic---will start hs NS IVF 14. Leukocytosis: 13k--reactive. No s/s infection at present. 12.1 8/19    LOS: 3 days A FACE TO FACE EVALUATION WAS PERFORMED  Ranelle Oyster 06/26/2021, 10:41 AM

## 2021-06-26 NOTE — Progress Notes (Signed)
Occupational Therapy Session Note  Patient Details  Name: Todd Duran MRN: [id number] Date of Birth: 04-07-1946  Today's Date: 06/26/2021 Session 1 OT Individual Time: 1000-1100 OT Individual Time Calculation (min): 60 min   Session 2 OT Individual Time: 5859-2924 OT Individual Time Calculation (min): 58 min   Short Term Goals: Week 1:  OT Short Term Goal 1 (Week 1): Pt will complete LB dressing with Mod A and use of AE PRN OT Short Term Goal 2 (Week 1): Pt will complete BSC/toilet transfer with Mod A OT Short Term Goal 3 (Week 1): Pt will sit EOB with no more than CGA for ADL routine OT Short Term Goal 4 (Week 1): Pt will self feed with Supervision with good alertness and no more than Min cues to attend  Skilled Therapeutic Interventions/Progress Updates:    Session 1 Pt greeted semi-reclined in bed and agreeable to OT treatment session. Pt reported he had already washed up this morning. Worked on pt achieving long sitting with min A to then thread pants. OT educated on modified technique to threading pants and pt able to get B LE's in. Worked on rolling L and R to pull up pants. PT needed cues for rolling and still min A to advance pants past hips. Blocked practice for AP transfer bed<>wc. Pt was able to lift bottom and reciprocally scoot back into chair with CGA and increased time. Worked on then scooting forward back onto bed with increased difficulty and mod A. Once rested, pt able to back himself back into wc with min A. Grooming tasks completed from wc at the sink with set-up A. UB there-ex using 3 lb dowel rod. Chest press, bicep curl, and straight arm raise. Pt returned to room and left seated in wc with alarm belt on, call bell in reach, and needs met.   Session 2 Pt greeted asleep in bed, easy to wake and agreeable to OT treatment session. Worked on AP transfer from bed to wc with increased time, but only min A. Pt donned wc gloves and propelled wc in hallway to elevators. OT  educated on elevator access from wc level and pt demonstrated understanding to back into elevator. Pt reported his arms were tired, so OT pushed pt to picnic tables outside. Pt completed UB there-ex using level 2 orange theraband. 3 sets of 10 seated row, triceps press, and bicep curl. Pt needed extended rest breaks in between sets and max verbal cues for each exercise as pt had difficulty understanding body mechanics and directions from OT. Pt reported max fatigue and wanted to return to the room. AP transfer back to bed with pt needing max A and max cues to motor plan reciprocal scoot forward. Pt left semi-reclined in bed with sister present, bed alarm on, and needs met.     Therapy Documentation Precautions:  Precautions Precautions: Fall Precaution Comments: L AKA (05/20/2021), R BKA (06/21/2021), reduced cognition Restrictions Weight Bearing Restrictions: Yes RLE Weight Bearing: Non weight bearing LLE Weight Bearing: Non weight bearing Pain:  No number given, pt reports pain in R residual limb. Repositioned for comfort.    Therapy/Group: Individual Therapy  Valma Cava 06/26/2021, 1:59 PM

## 2021-06-26 NOTE — Progress Notes (Signed)
Physical Therapy Session Note  Patient Details  Name: Todd Duran MRN: [id number] Date of Birth: [birth year]  Today's Date: 06/26/2021 PT Missed Time: 60 Minutes Missed Time Reason: Patient fatigue  Short Term Goals: Week 1:  PT Short Term Goal 1 (Week 1): Pt will perform supine<>sit with CGA PT Short Term Goal 2 (Week 1): Pt will perform bed<>chair transfers using LRAD with CGA PT Short Term Goal 3 (Week 1): Pt will perform car transfer with min assist PT Short Term Goal 4 (Week 1): Pt will manage w/c parts and setup for transfer to bed with only min cuing PT Short Term Goal 5 (Week 1): Pt will propel w/c at least 26ft with supervision  Skilled Therapeutic Interventions/Progress Updates:     Pt asleep in bed upon PT entry. PT will follow up as able.  Therapy Documentation Precautions:  Precautions Precautions: Fall Precaution Comments: L AKA (05/20/2021), R BKA (06/21/2021), reduced cognition Restrictions Weight Bearing Restrictions: Yes RLE Weight Bearing: Non weight bearing LLE Weight Bearing: Non weight bearing General: PT Amount of Missed Time (min): 60 Minutes PT Missed Treatment Reason: Patient fatigue   Therapy/Group: Individual Therapy  Beau Fanny, PT, DPT 06/26/2021, 4:36 PM

## 2021-06-26 NOTE — IPOC Note (Signed)
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[facility]), new right BKA  Hospital Problems: Principal Problem:   Left above-knee amputee [facility])     Functional Problem List: Nursing Nutrition, Bowel, Pain, Perception, Edema, Safety, Endurance, Sensory, Medication Management, Skin Integrity  PT Balance, Safety, Behavior, Sensory, Edema, Skin Integrity, Endurance, Motor, Nutrition, Pain, Perception  OT Balance, Behavior, Cognition, Endurance, Motor, Pain, Perception, Safety  SLP    TR         Basic ADL's: OT Grooming, Bathing, Dressing, Toileting, Eating     Advanced  ADL's: OT       Transfers: PT Bed Mobility, Bed to Chair, Car, Occupational psychologist, Research scientist (life sciences): PT Wheelchair Mobility     Additional Impairments: OT None  SLP        TR      Anticipated Outcomes Item Anticipated Outcome  Self Feeding Independent  Swallowing      Basic self-care  Supervision transfers, Min A LB  Toileting  Supervision   Bathroom Transfers Supervision  Bowel/Bladder  to be continent x 2  Transfers  supervision  Locomotion  N/A  Communication     Cognition     Pain  less than 3  Safety/Judgment  will remain fall free while in rehab   Therapy Plan: PT Intensity: Minimum of 1-2 x/day ,45 to 90 minutes PT Frequency: 5 out of 7 days PT Duration Estimated Length of Stay: ~2 weeks OT Intensity: Minimum of 1-2 x/day, 45 to 90 minutes OT Frequency: 5 out of 7 days OT Duration/Estimated Length of Stay: 14-16 days     Due to the current state of emergency, patients may not be receiving their 3-hours of Medicare-mandated therapy.   Team Interventions: Nursing Interventions Patient/Family Education, Disease Management/Prevention, Skin Care/Wound Management, Discharge Planning, Bladder Management, Pain Management, Cognitive Remediation/Compensation, Psychosocial Support,  Bowel Management, Medication Management  PT interventions Community reintegration, DME/adaptive equipment instruction, Neuromuscular re-education, Psychosocial support, UE/LE Strength taining/ROM, Wheelchair propulsion/positioning, Warden/ranger, Discharge planning, Functional electrical stimulation, Pain management, Skin care/wound management, Therapeutic Activities, UE/LE Coordination activities, Cognitive remediation/compensation, Disease management/prevention, Functional mobility training, Patient/family education, Splinting/orthotics, Therapeutic Exercise, Visual/perceptual remediation/compensation  OT Interventions Balance/vestibular training, Self Care/advanced ADL retraining, Therapeutic Exercise, Wheelchair propulsion/positioning, Disease mangement/prevention, Cognitive remediation/compensation, DME/adaptive equipment instruction, Pain management, Skin care/wound managment, UE/LE Strength taining/ROM, Community reintegration, Functional electrical stimulation, Patient/family education, Splinting/orthotics, UE/LE Coordination activities, Discharge planning, Functional mobility training, Psychosocial support, Visual/perceptual remediation/compensation, Therapeutic Activities, Neuromuscular re-education  SLP Interventions    TR Interventions    SW/CM Interventions Discharge Planning, Psychosocial Support, Patient/Family Education   Barriers to Discharge MD  Medical stability  Nursing Wound Care Lives with wife in multilevel home. 4 steps to enter with right and left rails. 4 steps to access bedroom. Wife works but taking Transport planner and will be available 24/7.  PT Inaccessible home environment, Decreased caregiver support, Home environment access/layout, Lack of/limited family support, Weight bearing restrictions, Behavior    OT      SLP      SW Decreased caregiver support, Lack of/limited family support     Team Discharge Planning: Destination: PT-Home ,OT- Home , SLP-  Projected  Follow-up: PT-Home health PT, 24 hour supervision/assistance, OT-  Home health OT, SLP-  Projected Equipment Needs: PT-To be determined, OT- To be determined, SLP-  Equipment Details: PT- , OT-  Patient/family involved in discharge planning: PT- Patient,  OT-Family member/caregiver (pt  unable to provide d/t cognition), SLP-   MD ELOS: 2 weeks Medical Rehab Prognosis:  Excellent Assessment: The patient has been admitted for CIR therapies with the diagnosis of new right bka, recent left bka. Course complicated by ongoing anemia, likely GI source. The team will be addressing functional mobility, strength, stamina, balance, safety, adaptive techniques and equipment, self-care, bowel and bladder mgt, patient and caregiver education, pre-prosthetic education, wound care, pain mgt, community reentry. Goals have been set at supervision with PT and OT.   Due to the current state of emergency, patients may not be receiving their 3 hours per day of Medicare-mandated therapy.    Ranelle Oyster, MD, FAAPMR     See Team Conference Notes for weekly updates to the plan of care

## 2021-06-26 NOTE — Progress Notes (Signed)
Inpatient Rehabilitation Care Coordinator Assessment and Plan Patient Details  Name: Todd Duran MRN: [id number] Date of Birth: September 11, 1946  Today's Date: 06/26/2021  Hospital Problems: Principal Problem:   Left above-knee amputee [facility])  Past Medical History:  Past Medical History:  Diagnosis Date   Diabetes mellitus without complication ([place])    Hypertension    Past Surgical History:  Past Surgical History:  Procedure Laterality Date   ABDOMINAL AORTOGRAM W/LOWER EXTREMITY N/A 05/12/2021   Procedure: ABDOMINAL AORTOGRAM W/LOWER EXTREMITY;  Surgeon: Serafina Mitchell, MD;  Location: [place] [facility];  Service: Cardiovascular;  Laterality: N/A;   AMPUTATION Left 05/13/2021   Procedure: AMPUTATION BELOW KNEE LEFT ;  Surgeon: Serafina Mitchell, MD;  Location: [place];  Service: Vascular;  Laterality: Left;   AMPUTATION Left 05/20/2021   Procedure: AMPUTATION ABOVE KNEE;  Surgeon: Marty Heck, MD;  Location: [place];  Service: Vascular;  Laterality: Left;   AMPUTATION Right 06/05/2021   Procedure: RIGHT FIFTH TOE AMPUTATION;  Surgeon: Serafina Mitchell, MD;  [facility];  Service: Vascular;  Laterality: Right;   AMPUTATION Right 06/10/2021   Procedure: RIGHT THIRD AND  FOURTH TOE AMPUTATION;  Surgeon: Serafina Mitchell, MD;  Location[facility];  Service: Vascular;  Laterality: Right;   AMPUTATION Right 06/21/2021   Procedure: RIGHT BELOW KNEE AMPUTATION;  Surgeon: Serafina Mitchell, MD;  [facility];  Service: Vascular;  Laterality: Right;   ENDARTERECTOMY FEMORAL Right 06/05/2021   Procedure: RIGHT FEMORAL ENDARTERECTOMY;  Surgeon: Serafina Mitchell, MD;  Location[facility];  Service: Vascular;  Laterality: Right;   FEMORAL-POPLITEAL BYPASS GRAFT Right 06/10/2021   Procedure: RIGHT FEMORAL TO ABOVE KNEE POPLITEAL ARTERY BYPASS GRAFTING USING RIGHT NON REVERSED GREATER SAPHENOUS VEIN;  Surgeon: Serafina Mitchell, MD;  [facility];  Service: Vascular;  Laterality: Right;   HERNIA REPAIR      INSERTION OF ILIAC STENT Right 06/05/2021   Procedure: INSERTION OF RIGHT ILIAC AND SUPERFICIAL FEMORAL ARTERY STENT;  Surgeon: Serafina Mitchell, MD;  [facility];  Service: Vascular;  Laterality: Right;   KNEE SURGERY     PATCH ANGIOPLASTY Right 06/05/2021   Procedure: PATCH ANGIOPLASTY OF RIGHT FEMORAL ARTERY USING XENOSURE BIOLOGIC PATCH;  Surgeon: Serafina Mitchell, MD;  [facility];  Service: Vascular;  Laterality: Right;   VEIN HARVEST Right 06/10/2021   Procedure: VEIN HARVEST OF RIGHT GREATER SAPHENOUS VEIN;  Surgeon: Serafina Mitchell, MD;  [facility];  Service: Vascular;  Laterality: Right;   Social History:  reports that he has been smoking cigarettes. He has been smoking an average of .5 packs per day. He has never used smokeless tobacco. He reports current alcohol use. He reports that he does not use drugs.  Family / Support Systems Marital Status: Married Patient Roles: Spouse Spouse/Significant Other: Todd Duran (wife): [phone] Children: 1 son that lives in [place] Other Supports: N/A Anticipated Caregiver: wife Ability/Limitations of Caregiver: Wife will provide 24/7 care. PRN support from other family members. Caregiver Availability: 24/7 Family Dynamics: Pt lives with his wife.  Social History Preferred language: English Religion:  Education: come college Read: Yes Write: Yes Employment Status: Retired Public relations account executive Issues: Denies Guardian/Conservator: N/A   Abuse/Neglect Abuse/Neglect Assessment Can Be Completed: Yes Physical Abuse: Denies Verbal Abuse: Denies Sexual Abuse: Denies Exploitation of patient/patient's resources: Denies Self-Neglect: Denies  Emotional Status Pt's affect, behavior and adjustment status: Pt pleasant during session. Pt appears to be depressed given what has happened.  Recent Psychosocial Issues: Denies Psychiatric History: Denies Substance Abuse History: reports that he has been smoking cigarettes. He  has been smoking an average of 0.50 packs per day. He has never used smokeless tobacco. No history on file for alcohol use and drug use.  Patient / Family Perceptions, Expectations & Goals Pt/Family understanding of illness & functional limitations: Pt and family have a general understanding of his care needs Premorbid pt/family roles/activities: independent prior to hospital admission Anticipated changes in roles/activities/participation: Assistance with ADLs/IADLs Pt/family expectations/goals: When SW asked pt his goal, pt stated "I don't know" and shrugged his shoulders.  Community [employer] Agencies: None Premorbid Home Care/DME Agencies: Other (Comment) ([place] for HHPT/OT/aide) Transportation available at discharge: Family member to transport since pt wife does not drive. Resource referrals recommended: Neuropsychology  Discharge Planning Living Arrangements: Spouse/significant other Support Systems: Spouse/significant other, Other relatives Type of Residence: Private residence Insurance Resources: Multimedia programmer (specify) ([place]) Financial Screen Referred: No Living Expenses: Mortgage Money Management: Spouse, Patient Does the patient have any problems obtaining your medications?: No Home Management: Wife manages all home care needs Patient/Family Preliminary Plans: no changes Care Coordinator Barriers to Discharge: Decreased caregiver support, Lack of/limited family support Care Coordinator Anticipated Follow Up Needs: HH/OP  Clinical Impression This SW familiar with pt as he is a previous patient on rehab.   SW met with pt in room at bedside to greet and discuss discharge process. Pt is a b  SW met with pt in room to introduce self, explain role, and discuss discharge process.  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 Pt admits to DUI in past. Current DME: w/c, RW, DABSC, TTB and slide board. Pt aware SW to follow-up with his wife.    [place] made  contact with pt wife Todd Duran [phone]) to discuss discharge plan. Pt wife reports she knows that his family will be willing to assist with care needs. States all DME delivered already. States pt home was assessed for w/c ramp and waiting for updates. SW informed will continue to follow-up after team conference.   SW updated Todd Duran care to inform [facility] will continue to follow patient until d/c. SW sent H&P.   [facility] PCP-Dr. Tobey Grim ([facility] in [place])    Rana Snare 06/26/2021, 3:31 PM

## 2021-06-26 NOTE — Progress Notes (Signed)
Occupational Therapy Note  Patient Details  Name: ALLAN MINOTTI MRN: [id number] Date of Birth: 1946-04-29  Today's Date: 06/26/2021 OT Missed Time: 47 Minutes Missed Time Reason: Patient fatigue  Patient greeted asleep in bed and declines participating in OT 2/2 fatigue. Pt left semi-reclined with needs met.    Daneen Schick Silvia Markuson 06/26/2021, 3:19 PM

## 2021-06-27 ENCOUNTER — Inpatient Hospital Stay (HOSPITAL_COMMUNITY)
Admission: EM | Admit: 2021-06-27 | Discharge: 2021-07-27 | DRG: 270 | Disposition: A | Discharge status: Hospice | Payer: No Typology Code available for payment source | Source: Other Acute Inpatient Hospital | Attending: Surgery | Admitting: Surgery

## 2021-06-27 ENCOUNTER — Inpatient Hospital Stay (HOSPITAL_COMMUNITY): Payer: No Typology Code available for payment source

## 2021-06-27 ENCOUNTER — Inpatient Hospital Stay (HOSPITAL_COMMUNITY): Payer: No Typology Code available for payment source | Admitting: Anesthesiology

## 2021-06-27 ENCOUNTER — Encounter (HOSPITAL_COMMUNITY): Admission: EM | Disposition: A | Discharge status: Hospice | Payer: Self-pay | Attending: Surgery

## 2021-06-27 ENCOUNTER — Encounter (HOSPITAL_COMMUNITY): Payer: Self-pay | Admitting: Vascular Surgery

## 2021-06-27 DIAGNOSIS — D638 Anemia in other chronic diseases classified elsewhere: Secondary | ICD-10-CM | POA: Diagnosis present

## 2021-06-27 DIAGNOSIS — Z89612 Acquired absence of left leg above knee: Secondary | ICD-10-CM

## 2021-06-27 DIAGNOSIS — Z7189 Other specified counseling: Secondary | ICD-10-CM | POA: Diagnosis not present

## 2021-06-27 DIAGNOSIS — Z452 Encounter for adjustment and management of vascular access device: Secondary | ICD-10-CM | POA: Diagnosis not present

## 2021-06-27 DIAGNOSIS — B999 Unspecified infectious disease: Secondary | ICD-10-CM | POA: Diagnosis not present

## 2021-06-27 DIAGNOSIS — D62 Acute posthemorrhagic anemia: Secondary | ICD-10-CM | POA: Diagnosis not present

## 2021-06-27 DIAGNOSIS — L89152 Pressure ulcer of sacral region, stage 2: Secondary | ICD-10-CM | POA: Diagnosis present

## 2021-06-27 DIAGNOSIS — R0602 Shortness of breath: Secondary | ICD-10-CM | POA: Diagnosis not present

## 2021-06-27 DIAGNOSIS — I110 Hypertensive heart disease with heart failure: Secondary | ICD-10-CM | POA: Diagnosis present

## 2021-06-27 DIAGNOSIS — E1151 Type 2 diabetes mellitus with diabetic peripheral angiopathy without gangrene: Secondary | ICD-10-CM | POA: Diagnosis present

## 2021-06-27 DIAGNOSIS — I70203 Unspecified atherosclerosis of native arteries of extremities, bilateral legs: Secondary | ICD-10-CM | POA: Diagnosis present

## 2021-06-27 DIAGNOSIS — E871 Hypo-osmolality and hyponatremia: Secondary | ICD-10-CM | POA: Diagnosis not present

## 2021-06-27 DIAGNOSIS — N179 Acute kidney failure, unspecified: Secondary | ICD-10-CM | POA: Diagnosis not present

## 2021-06-27 DIAGNOSIS — Z5189 Encounter for other specified aftercare: Secondary | ICD-10-CM

## 2021-06-27 DIAGNOSIS — I5023 Acute on chronic systolic (congestive) heart failure: Secondary | ICD-10-CM | POA: Diagnosis not present

## 2021-06-27 DIAGNOSIS — Z8249 Family history of ischemic heart disease and other diseases of the circulatory system: Secondary | ICD-10-CM

## 2021-06-27 DIAGNOSIS — Z7902 Long term (current) use of antithrombotics/antiplatelets: Secondary | ICD-10-CM

## 2021-06-27 DIAGNOSIS — T8149XA Infection following a procedure, other surgical site, initial encounter: Secondary | ICD-10-CM | POA: Diagnosis not present

## 2021-06-27 DIAGNOSIS — I255 Ischemic cardiomyopathy: Secondary | ICD-10-CM | POA: Diagnosis present

## 2021-06-27 DIAGNOSIS — J449 Chronic obstructive pulmonary disease, unspecified: Secondary | ICD-10-CM | POA: Diagnosis present

## 2021-06-27 DIAGNOSIS — I5020 Unspecified systolic (congestive) heart failure: Secondary | ICD-10-CM | POA: Diagnosis not present

## 2021-06-27 DIAGNOSIS — T501X5A Adverse effect of loop [high-ceiling] diuretics, initial encounter: Secondary | ICD-10-CM | POA: Diagnosis not present

## 2021-06-27 DIAGNOSIS — E1152 Type 2 diabetes mellitus with diabetic peripheral angiopathy with gangrene: Secondary | ICD-10-CM | POA: Diagnosis present

## 2021-06-27 DIAGNOSIS — E1165 Type 2 diabetes mellitus with hyperglycemia: Secondary | ICD-10-CM | POA: Diagnosis not present

## 2021-06-27 DIAGNOSIS — I729 Aneurysm of unspecified site: Secondary | ICD-10-CM | POA: Diagnosis present

## 2021-06-27 DIAGNOSIS — J9602 Acute respiratory failure with hypercapnia: Principal | ICD-10-CM | POA: Diagnosis not present

## 2021-06-27 DIAGNOSIS — T8131XA Disruption of external operation (surgical) wound, not elsewhere classified, initial encounter: Secondary | ICD-10-CM | POA: Diagnosis not present

## 2021-06-27 DIAGNOSIS — I21A1 Myocardial infarction type 2: Secondary | ICD-10-CM | POA: Diagnosis not present

## 2021-06-27 DIAGNOSIS — F17210 Nicotine dependence, cigarettes, uncomplicated: Secondary | ICD-10-CM | POA: Diagnosis present

## 2021-06-27 DIAGNOSIS — Z9289 Personal history of other medical treatment: Secondary | ICD-10-CM | POA: Diagnosis not present

## 2021-06-27 DIAGNOSIS — B965 Pseudomonas (aeruginosa) (mallei) (pseudomallei) as the cause of diseases classified elsewhere: Secondary | ICD-10-CM | POA: Diagnosis not present

## 2021-06-27 DIAGNOSIS — R4182 Altered mental status, unspecified: Secondary | ICD-10-CM

## 2021-06-27 DIAGNOSIS — I4892 Unspecified atrial flutter: Secondary | ICD-10-CM | POA: Diagnosis not present

## 2021-06-27 DIAGNOSIS — A498 Other bacterial infections of unspecified site: Secondary | ICD-10-CM | POA: Diagnosis not present

## 2021-06-27 DIAGNOSIS — R0600 Dyspnea, unspecified: Secondary | ICD-10-CM | POA: Diagnosis not present

## 2021-06-27 DIAGNOSIS — R627 Adult failure to thrive: Secondary | ICD-10-CM | POA: Diagnosis not present

## 2021-06-27 DIAGNOSIS — I70263 Atherosclerosis of native arteries of extremities with gangrene, bilateral legs: Secondary | ICD-10-CM | POA: Diagnosis present

## 2021-06-27 DIAGNOSIS — E11649 Type 2 diabetes mellitus with hypoglycemia without coma: Secondary | ICD-10-CM | POA: Diagnosis not present

## 2021-06-27 DIAGNOSIS — I724 Aneurysm of artery of lower extremity: Secondary | ICD-10-CM | POA: Diagnosis present

## 2021-06-27 DIAGNOSIS — Z888 Allergy status to other drugs, medicaments and biological substances status: Secondary | ICD-10-CM

## 2021-06-27 DIAGNOSIS — Z20822 Contact with and (suspected) exposure to covid-19: Secondary | ICD-10-CM | POA: Diagnosis present

## 2021-06-27 DIAGNOSIS — Z79899 Other long term (current) drug therapy: Secondary | ICD-10-CM

## 2021-06-27 DIAGNOSIS — T8789 Other complications of amputation stump: Secondary | ICD-10-CM | POA: Diagnosis not present

## 2021-06-27 DIAGNOSIS — J9601 Acute respiratory failure with hypoxia: Secondary | ICD-10-CM

## 2021-06-27 DIAGNOSIS — Z6832 Body mass index (BMI) 32.0-32.9, adult: Secondary | ICD-10-CM | POA: Diagnosis not present

## 2021-06-27 DIAGNOSIS — E440 Moderate protein-calorie malnutrition: Secondary | ICD-10-CM | POA: Insufficient documentation

## 2021-06-27 DIAGNOSIS — Y835 Amputation of limb(s) as the cause of abnormal reaction of the patient, or of later complication, without mention of misadventure at the time of the procedure: Secondary | ICD-10-CM | POA: Diagnosis not present

## 2021-06-27 DIAGNOSIS — E860 Dehydration: Secondary | ICD-10-CM | POA: Diagnosis not present

## 2021-06-27 DIAGNOSIS — Z4659 Encounter for fitting and adjustment of other gastrointestinal appliance and device: Secondary | ICD-10-CM | POA: Diagnosis not present

## 2021-06-27 DIAGNOSIS — J811 Chronic pulmonary edema: Secondary | ICD-10-CM

## 2021-06-27 DIAGNOSIS — I5043 Acute on chronic combined systolic (congestive) and diastolic (congestive) heart failure: Secondary | ICD-10-CM | POA: Diagnosis not present

## 2021-06-27 DIAGNOSIS — K921 Melena: Secondary | ICD-10-CM | POA: Diagnosis not present

## 2021-06-27 DIAGNOSIS — G9340 Encephalopathy, unspecified: Secondary | ICD-10-CM | POA: Diagnosis not present

## 2021-06-27 DIAGNOSIS — Z66 Do not resuscitate: Secondary | ICD-10-CM | POA: Diagnosis not present

## 2021-06-27 DIAGNOSIS — E872 Acidosis: Secondary | ICD-10-CM | POA: Diagnosis not present

## 2021-06-27 DIAGNOSIS — I739 Peripheral vascular disease, unspecified: Secondary | ICD-10-CM | POA: Diagnosis not present

## 2021-06-27 DIAGNOSIS — L7622 Postprocedural hemorrhage and hematoma of skin and subcutaneous tissue following other procedure: Secondary | ICD-10-CM | POA: Diagnosis not present

## 2021-06-27 DIAGNOSIS — Z7984 Long term (current) use of oral hypoglycemic drugs: Secondary | ICD-10-CM

## 2021-06-27 DIAGNOSIS — E669 Obesity, unspecified: Secondary | ICD-10-CM | POA: Diagnosis present

## 2021-06-27 DIAGNOSIS — T827XXA Infection and inflammatory reaction due to other cardiac and vascular devices, implants and grafts, initial encounter: Secondary | ICD-10-CM | POA: Diagnosis present

## 2021-06-27 DIAGNOSIS — G546 Phantom limb syndrome with pain: Secondary | ICD-10-CM | POA: Diagnosis not present

## 2021-06-27 DIAGNOSIS — I700 Atherosclerosis of aorta: Secondary | ICD-10-CM | POA: Diagnosis present

## 2021-06-27 DIAGNOSIS — I471 Supraventricular tachycardia: Secondary | ICD-10-CM | POA: Diagnosis not present

## 2021-06-27 DIAGNOSIS — I97618 Postprocedural hemorrhage and hematoma of a circulatory system organ or structure following other circulatory system procedure: Secondary | ICD-10-CM | POA: Diagnosis not present

## 2021-06-27 DIAGNOSIS — J9690 Respiratory failure, unspecified, unspecified whether with hypoxia or hypercapnia: Secondary | ICD-10-CM

## 2021-06-27 DIAGNOSIS — I2510 Atherosclerotic heart disease of native coronary artery without angina pectoris: Secondary | ICD-10-CM | POA: Diagnosis present

## 2021-06-27 DIAGNOSIS — E785 Hyperlipidemia, unspecified: Secondary | ICD-10-CM | POA: Diagnosis present

## 2021-06-27 DIAGNOSIS — Z515 Encounter for palliative care: Secondary | ICD-10-CM | POA: Diagnosis not present

## 2021-06-27 DIAGNOSIS — E876 Hypokalemia: Secondary | ICD-10-CM | POA: Diagnosis not present

## 2021-06-27 DIAGNOSIS — I5021 Acute systolic (congestive) heart failure: Secondary | ICD-10-CM | POA: Diagnosis not present

## 2021-06-27 DIAGNOSIS — I4891 Unspecified atrial fibrillation: Secondary | ICD-10-CM | POA: Diagnosis not present

## 2021-06-27 DIAGNOSIS — T8141XA Infection following a procedure, superficial incisional surgical site, initial encounter: Secondary | ICD-10-CM | POA: Diagnosis not present

## 2021-06-27 DIAGNOSIS — I10 Essential (primary) hypertension: Secondary | ICD-10-CM | POA: Diagnosis not present

## 2021-06-27 DIAGNOSIS — Z7401 Bed confinement status: Secondary | ICD-10-CM

## 2021-06-27 DIAGNOSIS — Z681 Body mass index (BMI) 19 or less, adult: Secondary | ICD-10-CM

## 2021-06-27 DIAGNOSIS — R58 Hemorrhage, not elsewhere classified: Secondary | ICD-10-CM | POA: Diagnosis not present

## 2021-06-27 DIAGNOSIS — Z7982 Long term (current) use of aspirin: Secondary | ICD-10-CM

## 2021-06-27 DIAGNOSIS — I959 Hypotension, unspecified: Secondary | ICD-10-CM | POA: Diagnosis not present

## 2021-06-27 DIAGNOSIS — T82322A Displacement of femoral arterial graft (bypass), initial encounter: Secondary | ICD-10-CM | POA: Diagnosis not present

## 2021-06-27 HISTORY — PX: REPAIR ILIAC ARTERY: SHX6216

## 2021-06-27 LAB — POCT I-STAT, CHEM 8
BUN: 32 mg/dL — ABNORMAL HIGH (ref 8–23)
BUN: 40 mg/dL — ABNORMAL HIGH (ref 8–23)
Calcium, Ion: 1.2 mmol/L (ref 1.15–1.40)
Calcium, Ion: 1.18 mmol/L (ref 1.15–1.40)
Chloride: 101 mmol/L (ref 98–111)
Chloride: 102 mmol/L (ref 98–111)
Creatinine, Ser: 0.9 mg/dL (ref 0.61–1.24)
Creatinine, Ser: 0.9 mg/dL (ref 0.61–1.24)
Glucose, Bld: 136 mg/dL — ABNORMAL HIGH (ref 70–99)
Glucose, Bld: 130 mg/dL — ABNORMAL HIGH (ref 70–99)
HCT: 24 % — ABNORMAL LOW (ref 39.0–52.0)
HCT: 29 % — ABNORMAL LOW (ref 39.0–52.0)
Hemoglobin: 8.2 g/dL — ABNORMAL LOW (ref 13.0–17.0)
Hemoglobin: 9.9 g/dL — ABNORMAL LOW (ref 13.0–17.0)
Potassium: 4.4 mmol/L (ref 3.5–5.1)
Potassium: 4.7 mmol/L (ref 3.5–5.1)
Sodium: 132 mmol/L — ABNORMAL LOW (ref 135–145)
Sodium: 132 mmol/L — ABNORMAL LOW (ref 135–145)
TCO2: 24 mmol/L (ref 22–32)
TCO2: 25 mmol/L (ref 22–32)

## 2021-06-27 LAB — CBC
HCT: 21.8 % — ABNORMAL LOW (ref 39.0–52.0)
Hemoglobin: 6.9 g/dL — CL (ref 13.0–17.0)
MCH: 26 pg (ref 26.0–34.0)
MCHC: 31.7 g/dL (ref 30.0–36.0)
MCV: 82.3 fL (ref 80.0–100.0)
Platelets: 473 10*3/uL — ABNORMAL HIGH (ref 150–400)
RBC: 2.65 MIL/uL — ABNORMAL LOW (ref 4.22–5.81)
RDW: 16.7 % — ABNORMAL HIGH (ref 11.5–15.5)
WBC: 16.2 10*3/uL — ABNORMAL HIGH (ref 4.0–10.5)
nRBC: 0.1 % (ref 0.0–0.2)

## 2021-06-27 LAB — PREPARE RBC (CROSSMATCH)

## 2021-06-27 LAB — GLUCOSE, CAPILLARY
Glucose-Capillary: 162 mg/dL — ABNORMAL HIGH (ref 70–99)
Glucose-Capillary: 200 mg/dL — ABNORMAL HIGH (ref 70–99)
Glucose-Capillary: 142 mg/dL — ABNORMAL HIGH (ref 70–99)
Glucose-Capillary: 121 mg/dL — ABNORMAL HIGH (ref 70–99)

## 2021-06-27 SURGERY — GROIN EXPOSURE
Anesthesia: General | Site: Groin | Laterality: Right

## 2021-06-27 MED ORDER — METOPROLOL TARTRATE 5 MG/5ML IV SOLN
2.0000 mg | INTRAVENOUS | Status: AC | PRN
  Administered 2021-07-04: 2 mg via INTRAVENOUS
  Administered 2021-07-04: 5 mg via INTRAVENOUS
  Filled 2021-06-27 (×2): qty 5

## 2021-06-27 MED ORDER — PROPOFOL 10 MG/ML IV BOLUS
INTRAVENOUS | Status: DC | PRN
  Administered 2021-06-27: 70 mg via INTRAVENOUS

## 2021-06-27 MED ORDER — ONDANSETRON HCL 4 MG/2ML IJ SOLN: 4.0000 mg | Freq: Four times a day (QID) | INTRAMUSCULAR | Status: DC | PRN

## 2021-06-27 MED ORDER — MORPHINE SULFATE (PF) 2 MG/ML IV SOLN
2.0000 mg | INTRAVENOUS | Status: DC | PRN
  Administered 2021-06-27: 2 mg via INTRAVENOUS
  Administered 2021-06-28: 4 mg via INTRAVENOUS
  Filled 2021-06-27: qty 1
  Filled 2021-06-27: qty 2

## 2021-06-27 MED ORDER — METHOCARBAMOL 500 MG PO TABS
750.0000 mg | ORAL_TABLET | Freq: Four times a day (QID) | ORAL | Status: DC
  Administered 2021-06-28 – 2021-07-04 (×19): 750 mg via ORAL
  Filled 2021-06-27 (×25): qty 2

## 2021-06-27 MED ORDER — OXYCODONE HCL 5 MG/5ML PO SOLN: 5.0000 mg | Freq: Once | ORAL | Status: DC | PRN

## 2021-06-27 MED ORDER — PANTOPRAZOLE SODIUM 40 MG PO TBEC
40.0000 mg | DELAYED_RELEASE_TABLET | Freq: Every day | ORAL | Status: DC
  Administered 2021-06-28 – 2021-07-04 (×6): 40 mg via ORAL
  Filled 2021-06-27 (×6): qty 1

## 2021-06-27 MED ORDER — SODIUM CHLORIDE 0.9 % IV SOLN: INTRAVENOUS | Status: DC | PRN

## 2021-06-27 MED ORDER — 0.9 % SODIUM CHLORIDE (POUR BTL) OPTIME
TOPICAL | Status: DC | PRN
  Administered 2021-06-27: 2000 mL

## 2021-06-27 MED ORDER — CLOPIDOGREL BISULFATE 75 MG PO TABS
75.0000 mg | ORAL_TABLET | Freq: Every day | ORAL | Status: DC
  Administered 2021-06-29 – 2021-07-04 (×5): 75 mg via ORAL
  Filled 2021-06-27 (×5): qty 1

## 2021-06-27 MED ORDER — SODIUM CHLORIDE 0.9% IV SOLUTION: Freq: Once | INTRAVENOUS | Status: DC

## 2021-06-27 MED ORDER — ASPIRIN EC 81 MG PO TBEC
81.0000 mg | DELAYED_RELEASE_TABLET | Freq: Every day | ORAL | Status: DC
  Administered 2021-06-28 – 2021-07-04 (×6): 81 mg via ORAL
  Filled 2021-06-27 (×6): qty 1

## 2021-06-27 MED ORDER — POTASSIUM CHLORIDE CRYS ER 20 MEQ PO TBCR
20.0000 meq | EXTENDED_RELEASE_TABLET | Freq: Every day | ORAL | Status: AC | PRN
  Administered 2021-07-03: 40 meq via ORAL
  Filled 2021-06-27: qty 2

## 2021-06-27 MED ORDER — SODIUM CHLORIDE 0.9 % IV SOLN: INTRAVENOUS | Status: DC

## 2021-06-27 MED ORDER — HEPARIN SODIUM (PORCINE) 1000 UNIT/ML IJ SOLN
INTRAMUSCULAR | Status: DC | PRN
  Administered 2021-06-27: 5000 [IU] via INTRAVENOUS

## 2021-06-27 MED ORDER — ACETAMINOPHEN 325 MG PO TABS
325.0000 mg | ORAL_TABLET | ORAL | Status: DC | PRN
Start: 2021-06-27 — End: 2021-07-25
  Administered 2021-07-02 – 2021-07-15 (×4): 650 mg via ORAL
  Filled 2021-06-27 (×4): qty 2

## 2021-06-27 MED ORDER — HEPARIN 6000 UNIT IRRIGATION SOLUTION
Status: DC | PRN
  Administered 2021-06-27: 1

## 2021-06-27 MED ORDER — ALBUMIN HUMAN 5 % IV SOLN: INTRAVENOUS | Status: DC | PRN

## 2021-06-27 MED ORDER — GABAPENTIN 100 MG PO CAPS
100.0000 mg | ORAL_CAPSULE | Freq: Three times a day (TID) | ORAL | Status: DC
  Administered 2021-06-28 – 2021-07-04 (×16): 100 mg via ORAL
  Filled 2021-06-27 (×18): qty 1

## 2021-06-27 MED ORDER — PHENYLEPHRINE HCL-NACL 20-0.9 MG/250ML-% IV SOLN
INTRAVENOUS | Status: DC | PRN
  Administered 2021-06-27: 50 ug/min via INTRAVENOUS

## 2021-06-27 MED ORDER — AMLODIPINE BESYLATE 10 MG PO TABS
10.0000 mg | ORAL_TABLET | Freq: Every day | ORAL | Status: DC
  Administered 2021-06-28 – 2021-07-04 (×5): 10 mg via ORAL
  Filled 2021-06-27 (×6): qty 1

## 2021-06-27 MED ORDER — LABETALOL HCL 5 MG/ML IV SOLN
10.0000 mg | INTRAVENOUS | Status: DC | PRN
Start: 2021-06-27 — End: 2021-07-04
  Filled 2021-06-27: qty 4

## 2021-06-27 MED ORDER — HYDROCHLOROTHIAZIDE 25 MG PO TABS
25.0000 mg | ORAL_TABLET | Freq: Every day | ORAL | Status: DC
  Administered 2021-06-28 – 2021-07-01 (×3): 25 mg via ORAL
  Filled 2021-06-27 (×3): qty 1

## 2021-06-27 MED ORDER — DOCUSATE SODIUM 100 MG PO CAPS
100.0000 mg | ORAL_CAPSULE | Freq: Every day | ORAL | Status: DC
  Administered 2021-06-28 – 2021-07-03 (×5): 100 mg via ORAL
  Filled 2021-06-27 (×3): qty 1

## 2021-06-27 MED ORDER — ATENOLOL 25 MG PO TABS
100.0000 mg | ORAL_TABLET | Freq: Every day | ORAL | Status: DC
  Administered 2021-06-28 – 2021-07-01 (×4): 100 mg via ORAL
  Filled 2021-06-27 (×4): qty 4

## 2021-06-27 MED ORDER — ADULT MULTIVITAMIN W/MINERALS CH
1.0000 | ORAL_TABLET | Freq: Every day | ORAL | Status: DC
  Administered 2021-06-28 – 2021-07-04 (×5): 1 via ORAL
  Filled 2021-06-27 (×5): qty 1

## 2021-06-27 MED ORDER — PROMETHAZINE HCL 25 MG/ML IJ SOLN: 6.2500 mg | INTRAMUSCULAR | Status: DC | PRN

## 2021-06-27 MED ORDER — IOHEXOL 350 MG/ML SOLN
75.0000 mL | Freq: Once | INTRAVENOUS | Status: AC | PRN
  Administered 2021-06-27: 75 mL via INTRAVENOUS

## 2021-06-27 MED ORDER — GUAIFENESIN-DM 100-10 MG/5ML PO SYRP: 15.0000 mL | ORAL_SOLUTION | ORAL | Status: DC | PRN

## 2021-06-27 MED ORDER — OXYCODONE HCL 5 MG PO TABS: 5.0000 mg | ORAL_TABLET | Freq: Once | ORAL | Status: DC | PRN

## 2021-06-27 MED ORDER — ROCURONIUM BROMIDE 10 MG/ML (PF) SYRINGE
PREFILLED_SYRINGE | INTRAVENOUS | Status: DC | PRN
Start: 2021-06-27 — End: 2021-06-27
  Administered 2021-06-27 (×2): 20 mg via INTRAVENOUS
  Administered 2021-06-27: 60 mg via INTRAVENOUS

## 2021-06-27 MED ORDER — SUGAMMADEX SODIUM 200 MG/2ML IV SOLN
INTRAVENOUS | Status: DC | PRN
  Administered 2021-06-27: 200 mg via INTRAVENOUS

## 2021-06-27 MED ORDER — PROPOFOL 10 MG/ML IV BOLUS
INTRAVENOUS | Status: AC
  Filled 2021-06-27: qty 20

## 2021-06-27 MED ORDER — BISACODYL 5 MG PO TBEC: 5.0000 mg | DELAYED_RELEASE_TABLET | Freq: Every day | ORAL | Status: DC | PRN

## 2021-06-27 MED ORDER — HYDROMORPHONE HCL 1 MG/ML IJ SOLN: 0.2500 mg | INTRAMUSCULAR | Status: DC | PRN

## 2021-06-27 MED ORDER — OXYCODONE-ACETAMINOPHEN 5-325 MG PO TABS
1.0000 | ORAL_TABLET | ORAL | Status: DC | PRN
  Administered 2021-06-28 – 2021-06-29 (×4): 2 via ORAL
  Administered 2021-06-30: 1 via ORAL
  Administered 2021-07-01 (×2): 2 via ORAL
  Administered 2021-07-02: 1 via ORAL
  Filled 2021-06-27 (×4): qty 2
  Filled 2021-06-27 (×2): qty 1
  Filled 2021-06-27 (×2): qty 2

## 2021-06-27 MED ORDER — FENTANYL CITRATE (PF) 100 MCG/2ML IJ SOLN
INTRAMUSCULAR | Status: DC | PRN
  Administered 2021-06-27: 150 ug via INTRAVENOUS
  Administered 2021-06-27: 50 ug via INTRAVENOUS

## 2021-06-27 MED ORDER — ACETAMINOPHEN 650 MG RE SUPP: 325.0000 mg | RECTAL | Status: DC | PRN

## 2021-06-27 MED ORDER — ATORVASTATIN CALCIUM 10 MG PO TABS
20.0000 mg | ORAL_TABLET | Freq: Every day | ORAL | Status: DC
  Administered 2021-06-28 – 2021-06-30 (×3): 20 mg via ORAL
  Filled 2021-06-27 (×3): qty 2

## 2021-06-27 MED ORDER — CEFAZOLIN SODIUM-DEXTROSE 2-3 GM-%(50ML) IV SOLR
INTRAVENOUS | Status: DC | PRN
  Administered 2021-06-27: 2 g via INTRAVENOUS

## 2021-06-27 MED ORDER — MAGNESIUM SULFATE 2 GM/50ML IV SOLN: 2.0000 g | Freq: Every day | INTRAVENOUS | Status: DC | PRN

## 2021-06-27 MED ORDER — LIDOCAINE 2% (20 MG/ML) 5 ML SYRINGE
INTRAMUSCULAR | Status: DC | PRN
  Administered 2021-06-27: 40 mg via INTRAVENOUS

## 2021-06-27 MED ORDER — PROTAMINE SULFATE 10 MG/ML IV SOLN
INTRAVENOUS | Status: DC | PRN
  Administered 2021-06-27 (×2): 10 mg via INTRAVENOUS
  Administered 2021-06-27: 20 mg via INTRAVENOUS

## 2021-06-27 MED ORDER — METFORMIN HCL 500 MG PO TABS
500.0000 mg | ORAL_TABLET | Freq: Every day | ORAL | Status: DC
  Administered 2021-06-28 – 2021-07-03 (×5): 500 mg via ORAL
  Filled 2021-06-27 (×5): qty 1

## 2021-06-27 MED ORDER — SENNOSIDES-DOCUSATE SODIUM 8.6-50 MG PO TABS: 1.0000 | ORAL_TABLET | Freq: Every evening | ORAL | Status: DC | PRN

## 2021-06-27 MED ORDER — DEXAMETHASONE SODIUM PHOSPHATE 10 MG/ML IJ SOLN
INTRAMUSCULAR | Status: DC | PRN
  Administered 2021-06-27: 5 mg via INTRAVENOUS

## 2021-06-27 MED ORDER — ONDANSETRON HCL 4 MG/2ML IJ SOLN
INTRAMUSCULAR | Status: DC | PRN
  Administered 2021-06-27: 4 mg via INTRAVENOUS

## 2021-06-27 MED ORDER — MIDAZOLAM HCL 2 MG/2ML IJ SOLN
INTRAMUSCULAR | Status: AC
  Filled 2021-06-27: qty 2

## 2021-06-27 MED ORDER — ALUM & MAG HYDROXIDE-SIMETH 200-200-20 MG/5ML PO SUSP: 15.0000 mL | ORAL | Status: DC | PRN

## 2021-06-27 MED ORDER — PHENOL 1.4 % MT LIQD: 1.0000 | OROMUCOSAL | Status: DC | PRN

## 2021-06-27 MED ORDER — LACTATED RINGERS IV SOLN: INTRAVENOUS | Status: DC | PRN

## 2021-06-27 MED ORDER — ACETAMINOPHEN 325 MG PO TABS
650.0000 mg | ORAL_TABLET | Freq: Once | ORAL | Status: DC
  Filled 2021-06-27: qty 2

## 2021-06-27 MED ORDER — HYDRALAZINE HCL 20 MG/ML IJ SOLN: 5.0000 mg | INTRAMUSCULAR | Status: DC | PRN

## 2021-06-27 MED ORDER — HEPARIN SODIUM (PORCINE) 5000 UNIT/ML IJ SOLN
5000.0000 [IU] | Freq: Three times a day (TID) | INTRAMUSCULAR | Status: DC
Start: 2021-06-28 — End: 2021-07-04
  Administered 2021-06-28 – 2021-07-03 (×15): 5000 [IU] via SUBCUTANEOUS
  Filled 2021-06-27 (×16): qty 1

## 2021-06-27 MED ORDER — MEPERIDINE HCL 25 MG/ML IJ SOLN: 6.2500 mg | INTRAMUSCULAR | Status: DC | PRN

## 2021-06-27 MED ORDER — INSULIN ASPART 100 UNIT/ML IJ SOLN
0.0000 [IU] | Freq: Three times a day (TID) | INTRAMUSCULAR | Status: DC
  Administered 2021-06-28 – 2021-06-29 (×4): 2 [IU] via SUBCUTANEOUS
  Administered 2021-06-30 – 2021-07-03 (×3): 3 [IU] via SUBCUTANEOUS
  Administered 2021-07-03 – 2021-07-04 (×2): 5 [IU] via SUBCUTANEOUS

## 2021-06-27 MED ORDER — SODIUM CHLORIDE 0.9 % IV SOLN: 500.0000 mL | Freq: Once | INTRAVENOUS | Status: AC | PRN

## 2021-06-27 MED ORDER — FENTANYL CITRATE (PF) 250 MCG/5ML IJ SOLN
INTRAMUSCULAR | Status: AC
  Filled 2021-06-27: qty 5

## 2021-06-27 MED ORDER — CEFAZOLIN SODIUM-DEXTROSE 2-4 GM/100ML-% IV SOLN
2.0000 g | Freq: Three times a day (TID) | INTRAVENOUS | Status: AC
Start: 2021-06-28 — End: 2021-06-28
  Administered 2021-06-28 (×2): 2 g via INTRAVENOUS
  Filled 2021-06-27 (×2): qty 100

## 2021-06-27 MED ORDER — MIDAZOLAM HCL 2 MG/2ML IJ SOLN: 0.5000 mg | Freq: Once | INTRAMUSCULAR | Status: DC | PRN

## 2021-06-27 SURGICAL SUPPLY — 42 items
ADH SKN CLS APL DERMABOND .7 (GAUZE/BANDAGES/DRESSINGS) ×2
BNDG CMPR 9X4 STRL LF SNTH (GAUZE/BANDAGES/DRESSINGS) ×2
BNDG ESMARK 4X9 LF (GAUZE/BANDAGES/DRESSINGS) ×1 IMPLANT
CANISTER SUCT 3000ML PPV (MISCELLANEOUS) ×3 IMPLANT
CATH EMB 4FR 80CM (CATHETERS) ×2 IMPLANT
CATH EMB 5FR 80CM (CATHETERS) ×1 IMPLANT
CLIP VESOCCLUDE MED 24/CT (CLIP) ×3 IMPLANT
CLIP VESOCCLUDE SM WIDE 24/CT (CLIP) ×3 IMPLANT
CUFF TOURN SGL QUICK 18X4 (TOURNIQUET CUFF) ×1 IMPLANT
DERMABOND ADVANCED (GAUZE/BANDAGES/DRESSINGS) ×1
DERMABOND ADVANCED .7 DNX12 (GAUZE/BANDAGES/DRESSINGS) ×2 IMPLANT
DRSG COVADERM 4X8 (GAUZE/BANDAGES/DRESSINGS) ×1 IMPLANT
GLOVE SRG 8 PF TXTR STRL LF DI (GLOVE) ×2 IMPLANT
GLOVE SURG ENC MOIS LTX SZ7.5 (GLOVE) ×3 IMPLANT
GLOVE SURG UNDER POLY LF SZ8 (GLOVE) ×3
GOWN STRL REUS W/ TWL LRG LVL3 (GOWN DISPOSABLE) ×6 IMPLANT
GOWN STRL REUS W/ TWL XL LVL3 (GOWN DISPOSABLE) ×4 IMPLANT
GOWN STRL REUS W/TWL LRG LVL3 (GOWN DISPOSABLE) ×9
GOWN STRL REUS W/TWL XL LVL3 (GOWN DISPOSABLE)
INSERT FOGARTY 61MM (MISCELLANEOUS) ×1 IMPLANT
INSERT FOGARTY SM (MISCELLANEOUS) ×2 IMPLANT
KIT BASIN OR (CUSTOM PROCEDURE TRAY) ×3 IMPLANT
KIT TURNOVER KIT B (KITS) ×3 IMPLANT
NS IRRIG 1000ML POUR BTL (IV SOLUTION) ×4 IMPLANT
PACK PERIPHERAL VASCULAR (CUSTOM PROCEDURE TRAY) ×3 IMPLANT
PAD ARMBOARD 7.5X6 YLW CONV (MISCELLANEOUS) ×3 IMPLANT
SPONGE INTESTINAL PEANUT (DISPOSABLE) ×1 IMPLANT
STAPLER VISISTAT 35W (STAPLE) ×1 IMPLANT
STOPCOCK 4 WAY LG BORE MALE ST (IV SETS) ×1 IMPLANT
SUT MNCRL AB 4-0 PS2 18 (SUTURE) ×2 IMPLANT
SUT PDS AB 0 CT 36 (SUTURE) ×1 IMPLANT
SUT PROLENE 5 0 C 1 24 (SUTURE) ×5 IMPLANT
SUT PROLENE 6 0 BV (SUTURE) ×4 IMPLANT
SUT VIC AB 2-0 CT1 27 (SUTURE)
SUT VIC AB 2-0 CT1 TAPERPNT 27 (SUTURE) ×2 IMPLANT
SUT VIC AB 2-0 CTB1 (SUTURE) ×2 IMPLANT
SUT VIC AB 3-0 SH 27 (SUTURE) ×6
SUT VIC AB 3-0 SH 27X BRD (SUTURE) ×2 IMPLANT
SUT VIC AB 4-0 PS2 27 (SUTURE) ×2 IMPLANT
SYR 3ML LL SCALE MARK (SYRINGE) ×2 IMPLANT
TOWEL GREEN STERILE (TOWEL DISPOSABLE) ×3 IMPLANT
WATER STERILE IRR 1000ML POUR (IV SOLUTION) ×3 IMPLANT

## 2021-06-27 NOTE — Progress Notes (Signed)
CHG bath completed, called report to OR, per OR hold blood and send with pt

## 2021-06-27 NOTE — Op Note (Signed)
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 Edilia Bo, MD  ASSIST: Graceann Congress, PA  ANESTHESIA: General  EBL: 400 cc  INDICATIONS:    Todd Duran is a 75 y.o. male whose had multiple operations on the right groin.  He had a very extensive right external iliac and common femoral artery endarterectomy with bovine pericardial patch angioplasty.  He subsequently had a right femoral to above-knee popliteal artery bypass with vein.  He developed a sudden onset of bleeding at the bedside and rehab today.  CAT scan showed a large pseudoaneurysm in the right groin.  He was taken urgently to the operating room for exploration.  FINDINGS:   The medial aspect of the bovine pericardial patch was disrupted in the midportion of the patch angioplasty site.  This was repaired with a running 5-0 Prolene suture.  There was no signs of infection.  TECHNIQUE:   The patient was taken to the operating room and monitoring lines were placed by anesthesia.  The lower abdomen and right lower extremity were prepped and draped in usual sterile fashion.  In order to get proximal control I felt it would be necessary to do a retroperitoneal exposure of the external iliac artery.  An oblique incision was made in the right lower quadrant and the dissection carried down to the external oblique which is divided the length the incision.  The internal oblique and transversus abdominis muscle were then divided allowing exposure of the retroperitoneal space.  The retroperitoneum was retracted medially exposing the right external iliac artery.  This artery was markedly calcified.  There  was only one area or I could place a clamp adequately.  Thus I had proximal control.  Next I opened the incision and started distally where I was able to identify the bypass graft which was patent and also the distal patch which extended slightly onto the superficial femoral artery.  Because of dense scar tissue it was very difficult to fully expose the superficial femoral artery and deep femoral artery and the arteries were quite fragile.  However I was able to dissect out enough that I could get a clamp across the distal common femoral artery to control the deep and superficial femoral artery.  The bypass graft was controlled with a vessel loop.  There was a pulsatile hematoma proximally.  The patient was heparinized.  I then controlled the right external iliac artery and then clamped distally and controlled the graft.  I then entered the pseudoaneurysm.  There was still some active bleeding where the patch had disrupted along the midportion of the repair immediately.  I tried to place Fogarty catheters proximally and distally but still had significant bleeding.  I was able to close the disrupted area with a running 5-0 Prolene suture.  I then released the clamps and there was excellent hemostasis.  The medial wall of the common femoral artery was significantly thin.  The only other option would be to remove the entire patch and replace it with vein using the saphenous vein bypass graft.  However, the artery was very thin and I thought additional dissection of the artery could result in catastrophic  bleeding.  At that point I had excellent hemostasis and was reluctant to pursue any more aggressive dissection.  His heparin was partially reversed with protamine.  There was good hemostasis.  There was a good pulse in the bypass graft.  The right groin incision was irrigated.  The deep layer was closed with running 2-0 Vicryl.  The subcutaneous layer was closed with 3-0 Vicryl.  The skin was closed with staples.   The retroperitoneal incision was closed with a deep layer of #1 PDS on the internal oblique and transversus abdominis muscle.  The external oblique was closed with a running #2 Vicryl.  The subcutaneous layer was closed with 3-0 Vicryl to skin closed with staples.  Patient tolerated procedure well was transferred to recovery room in stable condition.  All needle and sponge counts were correct.  Given the complexity of the case a first assistant was necessary in order to expedient the procedure and safely perform the technical aspects of the operation.  Waverly Ferrari, MD, FACS [facility]  DATE OF DICTATION:   06/27/2021

## 2021-06-27 NOTE — Progress Notes (Signed)
Lab called critical result for Hgb 6.9. Called Dr Wynn Banker. Type and cross pending and CT

## 2021-06-27 NOTE — Progress Notes (Signed)
PROGRESS NOTE   Subjective/Complaints:  No pain in RIght BKA or L AKA , no phantom pains  ROS: Patient denies CP, SOB, N/V/D  Objective:   No results found. Recent Labs    06/24/21 1822 06/26/21 0716  WBC 13.4* 12.2*  HGB 8.1* 8.0*  HCT 25.8* 24.2*  PLT 635* 532*    Recent Labs    06/24/21 1822 06/26/21 0716  NA 128* 128*  K 4.5 4.1  CL 96* 96*  CO2 23 22  GLUCOSE 128* 204*  BUN 23 33*  CREATININE 1.01 1.32*  CALCIUM 9.3 9.1     Intake/Output Summary (Last 24 hours) at 06/27/2021 1152 Last data filed at 06/27/2021 4128 Gross per 24 hour  Intake 832.21 ml  Output --  Net 832.21 ml      Pressure Injury 06/05/21 Coccyx Mid;Other (Comment) Stage 2 -  Partial thickness loss of dermis presenting as a shallow open injury with a red, pink wound bed without slough. (Active)  06/05/21 2200  Location: Coccyx  Location Orientation: Mid;Other (Comment) (coccyx)  Staging: Stage 2 -  Partial thickness loss of dermis presenting as a shallow open injury with a red, pink wound bed without slough.  Wound Description (Comments):   Present on Admission: Yes    Physical Exam: Vital Signs Blood pressure 116/61, pulse (!) 105, temperature 98.1 F (36.7 C), temperature source Oral, resp. rate 20, height 4\' 5"  (1.346 m), weight 52.3 kg, SpO2 100 %.   General: No acute distress Mood and affect are appropriate Heart: Regular rate and rhythm no rubs murmurs or extra sounds Lungs: Clear to auscultation, breathing unlabored, no rales or wheezes Abdomen: Positive bowel sounds, soft nontender to palpation, nondistended Extremities: No clubbing, cyanosis, or edema Skin: No evidence of breakdown, no evidence of rash Left AKA well healed RIght BKA mild edema, staples intact no drainage or erythema  Neurologic motor strength is 5/5 in bilateral deltoid, bicep, tricep, grip, 4/5 hip flexor,     Musculoskeletal: moderate pain  and swelling in right stump with palpation. Left stump not tender    Assessment/Plan: 1. Functional deficits which require 3+ hours per day of interdisciplinary therapy in a comprehensive inpatient rehab setting. Physiatrist is providing close team supervision and 24 hour management of active medical problems listed below. Physiatrist and rehab team continue to assess barriers to discharge/monitor patient progress toward functional and medical goals  Care Tool:  Bathing    Body parts bathed by patient:  (unable to bathe at eval d/t cognition)   Body parts bathed by helper: Front perineal area Body parts n/a: Left lower leg   Bathing assist Assist Level: Dependent - Patient 0% (per OT note)     Upper Body Dressing/Undressing Upper body dressing   What is the patient wearing?: Hospital gown only    Upper body assist Assist Level:  (unable to dress at eval d/t cognition)    Lower Body Dressing/Undressing Lower body dressing      What is the patient wearing?: Pants     Lower body assist Assist for lower body dressing: Dependent - Patient 0%     Toileting Toileting Toileting Activity did not occur  and hygiene only): N/A (no void or bm)  Toileting assist Assist for toileting:  (did not assess at eval)     Transfers Chair/bed transfer  Transfers assist  Chair/bed transfer activity did not occur: Safety/medical concerns  Chair/bed transfer assist level: Minimal Assistance - Patient > 75%     Locomotion Ambulation   Ambulation assist   Ambulation activity did not occur: Safety/medical concerns  Assist level: Minimal Assistance - Patient > 75%       Walk 10 feet activity   Assist  Walk 10 feet activity did not occur: Safety/medical concerns        Walk 50 feet activity   Assist Walk 50 feet with 2 turns activity did not occur: Safety/medical concerns         Walk 150 feet activity   Assist Walk 150 feet activity did not  occur: Safety/medical concerns         Walk 10 feet on uneven surface  activity   Assist Walk 10 feet on uneven surfaces activity did not occur: Safety/medical concerns         Wheelchair     Assist Will patient use wheelchair at discharge?: Yes Type of Wheelchair: Manual Wheelchair activity did not occur: Safety/medical concerns  Wheelchair assist level: Set up assist, Supervision/Verbal cueing Max wheelchair distance: 176ft    Wheelchair 50 feet with 2 turns activity    Assist    Wheelchair 50 feet with 2 turns activity did not occur: Safety/medical concerns   Assist Level: Supervision/Verbal cueing   Wheelchair 150 feet activity     Assist  Wheelchair 150 feet activity did not occur: Safety/medical concerns   Assist Level: Supervision/Verbal cueing   Blood pressure 116/61, pulse (!) 105, temperature 98.1 F (36.7 C), temperature source Oral, resp. rate 20, height 4\' 5"  (1.346 m), weight 52.3 kg, SpO2 100 %.  Medical Problem List and Plan: 1.  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  Antithrombotics: -DVT/anticoagulation: Subcutaneous heparin Pharmaceutical: Heparin             -antiplatelet therapy: Aspirin 81 mg daily and Plavix 75 mg daily 3. Pain Management: Neurontin 100 mg twice daily, oxycodone as needed 4. Mood: Provide emotional support             -antipsychotic agents: N/A 5. Neuropsych: This patient is capable of making decisions on his own behalf. 6. Skin/Wound Care: Routine skin checks             -shrinker LLE             -right BKA wound CDI, can wear shrinker alone. May need replacement shrinkers 7. Fluids/Electrolytes/Nutrition: Routine in and outs with follow-up chemistries 8.  Acute  blood loss anemia.  Continue iron supplement.  hgb 8.1 --> 8.0 8/19  -continue supplementing  -8/19 has had one heme + stool during last admit. Recheck this weekend. May need GI consult ultimately 9.  Diabetes mellitus with peripheral neuropathy.  Hemoglobin A1c 5.2.  Glucophage 500 mg daily  CBG (last 3)  Recent Labs    06/26/21 2110 06/27/21 0631 06/27/21 1111  GLUCAP 159* 162* 200*  Fair control will monitor may need glimipride if running above 180 consistently  10.  Hypertension.  Norvasc 10 mg daily, Tenormin 25 mg twice daily.    8/19 controlled 11.  Hyperlipidemia.  Lipitor 12.  History of alcohol tobacco use.  Provide counseling 13. Hyponatremia: persistent, stable at 128 today  -added mild FR  -looks a little hypovolemic---will start hs NS IVF 14. Leukocytosis: 13k--reactive. No s/s infection at present. 12.1 8/19  15 Pre renal azotemia , recheck BMET on Monday , cont IVF   LOS: 4 days A FACE TO FACE EVALUATION WAS PERFORMED  Erick Colace 06/27/2021, 11:52 AM

## 2021-06-27 NOTE — Anesthesia Preprocedure Evaluation (Deleted)
Anesthesia Evaluation    Airway        Dental   Pulmonary Current Smoker,           Cardiovascular hypertension, Pt. on medications and Pt. on home beta blockers + Peripheral Vascular Disease       Neuro/Psych    GI/Hepatic   Endo/Other  diabetes, Oral Hypoglycemic Agents  Renal/GU      Musculoskeletal   Abdominal   Peds  Hematology  (+) Blood dyscrasia (Hb 6.9), anemia ,   Anesthesia Other Findings   Reproductive/Obstetrics                             Anesthesia Physical Anesthesia Plan  ASA: 4 and emergent  Anesthesia Plan: General   Post-op Pain Management:    Induction: Intravenous  PONV Risk Score and Plan: 1 and Ondansetron and Dexamethasone  Airway Management Planned: Oral ETT  Additional Equipment: Arterial line  Intra-op Plan:   Post-operative Plan: Extubation in OR  Informed Consent:   Plan Discussed with:   Anesthesia Plan Comments:         Anesthesia Quick Evaluation

## 2021-06-27 NOTE — Anesthesia Procedure Notes (Signed)
Procedure Name: Intubation Date/Time: 06/27/2021 6:14 PM Performed by: Edmonia Caprio, CRNA Pre-anesthesia Checklist: Patient identified, Emergency Drugs available, Suction available and Patient being monitored Patient Re-evaluated:Patient Re-evaluated prior to induction Oxygen Delivery Method: Circle system utilized Preoxygenation: Pre-oxygenation with 100% oxygen Induction Type: IV induction Ventilation: Mask ventilation without difficulty Laryngoscope Size: Miller and 2 Grade View: Grade I Tube type: Oral Tube size: 7.5 mm Number of attempts: 1 Airway Equipment and Method: Stylet Placement Confirmation: ETT inserted through vocal cords under direct vision, positive ETCO2 and breath sounds checked- equal and bilateral Secured at: 23 cm Tube secured with: Tape Dental Injury: Teeth and Oropharynx as per pre-operative assessment

## 2021-06-27 NOTE — Progress Notes (Signed)
Pt to CT

## 2021-06-27 NOTE — Progress Notes (Addendum)
Currently pt reports he is feeling okay. No c/o of pain, SOB, chest pain or dizziness. Dressing to right groin is CDI, no additional bleeding noted at this time.   Type and cross is complete.

## 2021-06-27 NOTE — Progress Notes (Addendum)
VASCULAR SURGERY:  I was called to see the patient because of bleeding from the right groin.  The bleeding has stopped.  The patient had a large amount of blood on his diaper which apparently bled from the right groin.  On exam there is pulsatility to the groin.  I have reviewed his records.  He was seen in our office on 05/08/2021.  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 On 06/05/2021 he underwent a right iliofemoral endarterectomy with bovine patch angioplasty and stenting of the right common iliac artery and external iliac.  On 06/10/2021 the patient had a redo right femoral artery exposure and a right common femoral artery to above-knee popliteal artery bypass with a vein graft.  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 Myra Gianotti.  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   I updated the wife.  His hemoglobin is 6.9.  He is being transfused preoperatively.  Cari Caraway, MD 3:06 PM

## 2021-06-27 NOTE — Transfer of Care (Signed)
Immediate Anesthesia Transfer of Care Note  Patient: Todd Duran  Procedure(s) Performed: RIGHT GROIN EXPLORATION FOR BLEEDING, REPAIR DISRUPTED BOVINE PATCH (Right: Groin) RETROPERITONEAL EXPOSURE AND REPAIR EXTERNAL ILIAC ARTERY (Right: Abdomen)  Patient Location: PACU  Anesthesia Type:General  Level of Consciousness: awake  Airway & Oxygen Therapy: Patient Spontanous Breathing  Post-op Assessment: Report given to RN and Post -op Vital signs reviewed and stable  Post vital signs: Reviewed and stable  Last Vitals:  Vitals Value Taken Time  BP 130/116 06/27/21 2036  Temp    Pulse 97 06/27/21 2035  Resp 20 06/27/21 2035  SpO2 98 % 06/27/21 2035  Vitals shown include unvalidated device data.  Last Pain:  Vitals:   06/27/21 1800  TempSrc: Oral         Complications: No notable events documented.

## 2021-06-27 NOTE — Progress Notes (Signed)
VASCULAR SURGERY:  I reviewed the CT images.  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  Given the bleeding earlier today I think exploration is indicated tonight.  I am worried about getting proximal control and we may have to do a proximal retroperitoneal exposure.  If the bovine pericardial patch is disrupted we may have to use vein either from the left leg or potentially use the bypass graft.  I have discussed this again with the wife and she understands that this is a potentially life-threatening problem.  She is agreeable to proceed and understands the risks involved.  Cari Caraway, MD 5:59 PM

## 2021-06-27 NOTE — Anesthesia Postprocedure Evaluation (Signed)
Anesthesia Post Note  Patient: TOURE EDMONDS  Procedure(s) Performed: RIGHT GROIN EXPLORATION FOR BLEEDING, REPAIR DISRUPTED BOVINE PATCH (Right: Groin) RETROPERITONEAL EXPOSURE AND REPAIR EXTERNAL ILIAC ARTERY (Right: Abdomen)     Patient location during evaluation: PACU Anesthesia Type: General Level of consciousness: awake and alert and patient cooperative (pt remains confused, unchanged from pre-op) Pain management: pain level controlled Vital Signs Assessment: post-procedure vital signs reviewed and stable Respiratory status: spontaneous breathing, nonlabored ventilation and respiratory function stable Cardiovascular status: blood pressure returned to baseline and stable Postop Assessment: no apparent nausea or vomiting Anesthetic complications: no   No notable events documented.  Last Vitals:  Vitals:   06/27/21 2110 06/27/21 2130  BP: (!) 121/101 116/63  Pulse: 94   Resp: 17   Temp:  36.7 C  SpO2: 96% 95%    Last Pain:  Vitals:   06/27/21 2130  TempSrc: Temporal  PainSc:                  Chelise Hanger,E. Sari Cogan

## 2021-06-27 NOTE — Progress Notes (Signed)
Pt will not return to rehab, called wife Magda Paganini, no option to leave msg. Personal belongings will be collected and kept at nurses station.

## 2021-06-27 NOTE — Anesthesia Procedure Notes (Addendum)
Arterial Line Insertion Start/End8/20/2022 6:20 PM, 06/27/2021 6:25 PM Performed by: Jairo Ben, MD, Dairl Ponder, CRNA, anesthesiologist  Patient location: Pre-op. Preanesthetic checklist: patient identified, IV checked, site marked, risks and benefits discussed, surgical consent, monitors and equipment checked, pre-op evaluation, timeout performed and anesthesia consent Left, radial was placed Catheter size: 20 G Hand hygiene performed  and maximum sterile barriers used   Attempts: 1 Procedure performed using ultrasound guided technique. Ultrasound Notes:anatomy identified, needle tip was noted to be adjacent to the nerve/plexus identified and no ultrasound evidence of intravascular and/or intraneural injection Following insertion, dressing applied and Biopatch. Post procedure assessment: normal and unchanged  Post procedure complications: second provider assisted. Patient tolerated the procedure well with no immediate complications.

## 2021-06-27 NOTE — Progress Notes (Addendum)
Pt called to room by pt. Reported bleeding. Pt unsure of area. Looked over pt and large amount of blood noted to be oozing from right groin (bypass area), VS taken, notified MD, new orders rec'd started NS at 37ml/hr, ordered stat labs CBC and type/cross, notified Dr Edilia Bo with vascular.  Placed NPO sign outside of door, will change diet in system if going to surgery. Notified Staff of NPO

## 2021-06-27 NOTE — Progress Notes (Signed)
Pt developed bleeding from groin wound.  Hgb dropped to6.9 IV fluids ordered , pt typed and crossed.  CT showing pseudoaneurysm Right femoral artery .   Per Dr Edilia Bo, VVS, to OR for repair and will go to stepdown or ICU post OP

## 2021-06-27 NOTE — Anesthesia Preprocedure Evaluation (Addendum)
Anesthesia Evaluation  Patient identified by MRN, date of birth, ID band Patient awake    Reviewed: Allergy & Precautions, NPO status , Patient's Chart, lab work & pertinent test results, reviewed documented beta blocker date and time Preop documentation limited or incomplete due to emergent nature of procedure.  History of Anesthesia Complications Negative for: history of anesthetic complications  Airway Mallampati: I  TM Distance: >3 FB Neck ROM: Full    Dental  (+) Missing, Dental Advisory Given   Pulmonary Current Smoker and Patient abstained from smoking.,    breath sounds clear to auscultation       Cardiovascular hypertension, Pt. on medications and Pt. on home beta blockers (-) angina Rhythm:Regular Rate:Normal     Neuro/Psych negative neurological ROS     GI/Hepatic negative GI ROS, Neg liver ROS,   Endo/Other  diabetes (glu 142), Oral Hypoglycemic Agents  Renal/GU negative Renal ROS     Musculoskeletal   Abdominal   Peds  Hematology  (+) Blood dyscrasia (Hb 6.9), anemia ,   Anesthesia Other Findings   Reproductive/Obstetrics                            Anesthesia Physical Anesthesia Plan  ASA: 4 and emergent  Anesthesia Plan: General   Post-op Pain Management:    Induction: Intravenous and Rapid sequence  PONV Risk Score and Plan: 1 and Ondansetron, Dexamethasone and Treatment may vary due to age or medical condition  Airway Management Planned: Oral ETT  Additional Equipment: Arterial line  Intra-op Plan:   Post-operative Plan: Possible Post-op intubation/ventilation  Informed Consent: I have reviewed the patients History and Physical, chart, labs and discussed the procedure including the risks, benefits and alternatives for the proposed anesthesia with the patient or authorized representative who has indicated his/her understanding and acceptance.     Consent  reviewed with POA and Dental advisory given  Plan Discussed with: CRNA and Surgeon  Anesthesia Plan Comments: (Discussed with wife by telephone, and patient)       Anesthesia Quick Evaluation

## 2021-06-28 ENCOUNTER — Encounter (HOSPITAL_COMMUNITY): Payer: Self-pay | Admitting: Vascular Surgery

## 2021-06-28 LAB — LIPID PANEL
Cholesterol: 88 mg/dL (ref 0–200)
HDL: <10 mg/dL — ABNORMAL LOW (ref >40)
Triglycerides: 145 mg/dL (ref <150)
VLDL: 29 mg/dL (ref 0–40)

## 2021-06-28 LAB — BPAM FFP
Blood Product Expiration Date: 202208252359
ISSUE DATE / TIME: 202208202127
Unit Type and Rh: 5100

## 2021-06-28 LAB — CBC
HCT: 26.8 % — ABNORMAL LOW (ref 39.0–52.0)
HCT: 24.1 % — ABNORMAL LOW (ref 39.0–52.0)
Hemoglobin: 9.2 g/dL — ABNORMAL LOW (ref 13.0–17.0)
Hemoglobin: 8 g/dL — ABNORMAL LOW (ref 13.0–17.0)
MCH: 28.1 pg (ref 26.0–34.0)
MCH: 27.6 pg (ref 26.0–34.0)
MCHC: 34.3 g/dL (ref 30.0–36.0)
MCHC: 33.2 g/dL (ref 30.0–36.0)
MCV: 82 fL (ref 80.0–100.0)
MCV: 83.1 fL (ref 80.0–100.0)
Platelets: 349 10*3/uL (ref 150–400)
Platelets: 365 10*3/uL (ref 150–400)
RBC: 3.27 MIL/uL — ABNORMAL LOW (ref 4.22–5.81)
RBC: 2.9 MIL/uL — ABNORMAL LOW (ref 4.22–5.81)
RDW: 15.6 % — ABNORMAL HIGH (ref 11.5–15.5)
RDW: 16.1 % — ABNORMAL HIGH (ref 11.5–15.5)
WBC: 18.5 10*3/uL — ABNORMAL HIGH (ref 4.0–10.5)
WBC: 17.9 10*3/uL — ABNORMAL HIGH (ref 4.0–10.5)
nRBC: 0 % (ref 0.0–0.2)
nRBC: 0 % (ref 0.0–0.2)

## 2021-06-28 LAB — GLUCOSE, CAPILLARY
Glucose-Capillary: 142 mg/dL — ABNORMAL HIGH (ref 70–99)
Glucose-Capillary: 135 mg/dL — ABNORMAL HIGH (ref 70–99)
Glucose-Capillary: 142 mg/dL — ABNORMAL HIGH (ref 70–99)
Glucose-Capillary: 126 mg/dL — ABNORMAL HIGH (ref 70–99)

## 2021-06-28 LAB — BASIC METABOLIC PANEL
Anion gap: 9 (ref 5–15)
BUN: 29 mg/dL — ABNORMAL HIGH (ref 8–23)
CO2: 22 mmol/L (ref 22–32)
Calcium: 8.6 mg/dL — ABNORMAL LOW (ref 8.9–10.3)
Chloride: 102 mmol/L (ref 98–111)
Creatinine, Ser: 0.87 mg/dL (ref 0.61–1.24)
GFR, Estimated: >60 mL/min (ref >60)
Glucose, Bld: 135 mg/dL — ABNORMAL HIGH (ref 70–99)
Potassium: 4.3 mmol/L (ref 3.5–5.1)
Sodium: 133 mmol/L — ABNORMAL LOW (ref 135–145)

## 2021-06-28 LAB — PREPARE FRESH FROZEN PLASMA

## 2021-06-28 LAB — APTT: aPTT: 31 seconds (ref 24–36)

## 2021-06-28 MED ORDER — CHLORHEXIDINE GLUCONATE CLOTH 2 % EX PADS
6.0000 | MEDICATED_PAD | Freq: Every day | CUTANEOUS | Status: DC
  Administered 2021-06-28 – 2021-07-04 (×4): 6 via TOPICAL

## 2021-06-28 NOTE — Progress Notes (Signed)
Physical Therapy Evaluation Patient Details Name: Todd Duran MRN: [id number] DOB: 06-19-1946 Today's Date: 06/28/2021   History of Present Illness  Pt is a 75 yo male admitted on 7/29 from CIR due to R lower limb ischemia. Pt underwent R femoral endarterectomy, R iliac & superficial fem art stent, R 5th toe amp. 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 Transferred to Rehab on 8/16.  Back to Acute on 8/20 due to pseudoaneurysm with exploration. Continued active bleeding on 8/21.  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 Pt unable to get to EOB due to active bleeding right groin. Nurse made aware and to check on pt.  Did reposition pt and do some exercises with bil UEs and left LE.  Will follow acutely.  Unsure if REhab plans to take pt back there for therapy or if pt will need to go SNF.  Pt currently with functional limitations due to the deficits listed below (see PT Problem List). Pt will benefit from skilled PT to increase their independence and safety with mobility to allow discharge to the venue listed below.       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), reduced cognition      Mobility  Bed Mobility               General bed mobility comments: Repositioned pt in bed but did not move to eOB or OOB as pt was activiely bleeding right groin    Transfers                 General transfer comment: NT this date  Ambulation/Gait                Stairs            Wheelchair Mobility     Modified Rankin (Stroke Patients Only)       Balance                                             Pertinent Vitals/Pain Pain Assessment: Faces Faces Pain Scale: Hurts even more Pain Location: R residual limb Pain Descriptors / Indicators: Grimacing;Sore Pain Intervention(s): Limited activity within patient's tolerance;Monitored during session;Premedicated before session;Repositioned    Home Living Family/patient expects to be discharged to:: Private residence Living Arrangements: Spouse/significant other Available Help at Discharge: Available 24 hours/day Type of Home: House Home Access: Stairs to enter Entrance Stairs-Rails: Doctor, general practice of Steps: 4 Home Layout: Two level;Able to live on main level with bedroom/bathroom Home Equipment: None Additional Comments: pt was d/c'd from CIR to have surgery. Pt lives with spouse typically who can provide 24/7 assist.    Prior Function Level of Independence: Needs assistance   Gait / Transfers Assistance Needed: pt doing lateral scoot and std pvt transfers in CIR  ADL's / Homemaking Assistance Needed: working with OT in CIR  Comments: Prior to initial BKA, pt was independent in all  tasks     Hand Dominance   Dominant Hand: Right    Extremity/Trunk Assessment   Upper Extremity Assessment Upper Extremity Assessment: Defer to OT evaluation    Lower Extremity Assessment Lower Extremity Assessment: RLE deficits/detail;LLE deficits/detail RLE Deficits / Details: Did not do much movement as pt with active bleeding right groin. Nurse made aware. LLE Deficits / Details: pt with good hip ROM in all directions    Cervical / Trunk Assessment Cervical / Trunk Assessment: Normal  Communication   Communication: HOH  Cognition Arousal/Alertness: Awake/alert Behavior During Therapy: WFL for tasks assessed/performed Overall Cognitive Status: No family/caregiver present to determine baseline  cognitive functioning Area of Impairment: Memory                     Memory: Decreased short-term memory Following Commands: Follows one step commands consistently Safety/Judgement: Decreased awareness of safety Awareness: Emergent Problem Solving: Slow processing;Requires verbal cues General Comments: patient pleasant      General Comments General comments (skin integrity, edema, etc.): VSS on RA    Exercises General Exercises - Upper Extremity Shoulder Flexion: Strengthening;Both;10 reps;Supine;AROM Shoulder Extension: AROM;Both;10 reps;Supine Elbow Flexion: Strengthening;Both;10 reps;Supine;AROM Elbow Extension: Strengthening;Both;10 reps;Supine;AROM General Exercises - Lower Extremity Quad Sets: AROM;10 reps;Left;Supine Gluteal Sets: AROM;Both;10 reps;Supine Hip ABduction/ADduction: AROM;Left;10 reps;Supine   Assessment/Plan    PT Assessment Patient needs continued PT services  PT Problem List Decreased strength;Decreased mobility;Decreased balance;Decreased activity tolerance;Decreased knowledge of precautions;Decreased knowledge of use of DME       PT Treatment Interventions DME instruction;Gait training;Functional mobility training;Therapeutic activities;Balance training;Patient/family education;Wheelchair mobility training    PT Goals (Current goals can be found in the Care Plan section)  Acute Rehab PT Goals Patient Stated Goal: rehab and go home PT Goal Formulation: With patient Time For Goal Achievement: 07/12/21 Potential to Achieve Goals: Good    Frequency Min 3X/week   Barriers to discharge        Co-evaluation               AM-PAC PT "6 Clicks" Mobility  Outcome Measure Help needed turning from your back to your side while in a flat bed without using bedrails?: None Help needed moving from lying on your back to sitting on the side of a flat bed without using bedrails?: A Lot Help needed moving to and from a bed to a chair (including a  wheelchair)?: Total Help needed standing up from a chair using your arms (e.g., wheelchair or bedside chair)?: Total Help needed to walk in hospital room?: Total Help needed climbing 3-5 steps with a railing? : Total 6 Click Score: 10    End of Session   Activity Tolerance: Patient tolerated treatment well Patient left: with bed alarm set;in bed;with call bell/phone within reach Nurse Communication: Mobility status PT Visit Diagnosis: Other abnormalities of gait and mobility (R26.89);Pain Pain - Right/Left: Right Pain - part of body: Leg    Time: 3710-6269 PT Time Calculation (min) (ACUTE ONLY): 15 min   Charges:   PT Evaluation $PT Eval Low Complexity: 1 Low          Lark Runk M,PT Acute Rehab Services [phone] (pager)   Bevelyn Buckles 06/28/2021, 1:13 PM

## 2021-06-28 NOTE — Progress Notes (Signed)
Consistent oozing from groin incisions. Notified MD. Wound vac placed by MD.  Sabra Heck, RN

## 2021-06-28 NOTE — Progress Notes (Addendum)
  Progress Note    06/28/2021 8:07 AM 1 Day Post-Op  Subjective:  right buttock discomfort   Vitals:   06/28/21 0500 06/28/21 0803  BP: 103/68 (!) 104/57  Pulse: 93 94  Resp: 18 19  Temp: 98.3 F (36.8 C) 98.6 F (37 C)  SpO2: 99% 100%   Physical Exam: Cardiac:  regular Lungs:  non labored Incisions:  RLQ incision and R groin incisions with staples intact. Bloody oozing from both incisions. Dressings changed. No swelling or hematoma. Soft surrounding. Fullness in Right upper thigh unchanged. Right BKA healing well Extremities:  left AKA healing well Abdomen:  flat soft non tender Neurologic: alert and oriented  CBC    Component Value Date/Time   WBC 18.5 (H) 06/28/2021 0113   RBC 3.27 (L) 06/28/2021 0113   HGB 9.2 (L) 06/28/2021 0113   HCT 26.8 (L) 06/28/2021 0113   PLT 349 06/28/2021 0113   MCV 82.0 06/28/2021 0113   MCH 28.1 06/28/2021 0113   MCHC 34.3 06/28/2021 0113   RDW 15.6 (H) 06/28/2021 0113   LYMPHSABS 1.9 06/24/2021 0518   MONOABS 0.6 06/24/2021 0518   EOSABS 0.2 06/24/2021 0518   BASOSABS 0.1 06/24/2021 0518    BMET    Component Value Date/Time   NA 133 (L) 06/28/2021 0113   K 4.3 06/28/2021 0113   CL 102 06/28/2021 0113   CO2 22 06/28/2021 0113   GLUCOSE 135 (H) 06/28/2021 0113   BUN 29 (H) 06/28/2021 0113   CREATININE 0.87 06/28/2021 0113   CALCIUM 8.6 (L) 06/28/2021 0113   GFRNONAA >60 06/28/2021 0113    INR No results found for: INR   Intake/Output Summary (Last 24 hours) at 06/28/2021 4481 Last data filed at 06/28/2021 0800 Gross per 24 hour  Intake 3005 ml  Output 1315 ml  Net 1690 ml     Assessment/Plan:  75 y.o. male is s/p   Retroperitoneal exposure of the right external iliac artery Exploration of right groin for bleeding Repair of bovine pericardial patch angioplasty 1 Day Post-Op   Pain well controlled Right groin and RLQ incisions are intact. There is some bloody oozing present. Dressings change. Will continue to  monitor Post op Hgb was stable early this morning at 9.2 Leukocytosis likely reactive Will get some repeat CBC/ BMP this morning  DVT prophylaxis:     Graceann Congress, PA-C [facility] [phone] 06/28/2021 8:07 AM  I have interviewed the patient and examined the patient. I agree with the findings by the PA.  No significant bleeding from the right groin.  This is a very difficult situation in that if he bleeds again there are no good options.  The artery was very fragile, and I do not think is amenable to redo patch angioplasty using his bypass graft as a vein patch.  The artery is simply too fragile.  He would require ligation which would result in significant ischemia.  Cari Caraway, MD

## 2021-06-28 NOTE — Progress Notes (Signed)
Left radial A line removed.  Pressure held to site for 10 min and no bleeding noted.  Dressing applied to site.  Foley cath d/ced.  10cc of water from balloon removed.  Foley removed without any difficulty.  Condom cath applied.   Denyse Amass, PA into see patient.  Removed right groin/lower abdomen dressing.  Slight ooze noted from abdomen staples.  Also from right groin staples.  Dressing reapplied by Denyse Amass.   Patient tolerated all of the above without any difficulties.  VSS

## 2021-06-28 NOTE — Discharge Summary (Signed)
Physician Discharge Summary  Patient ID: Todd Duran MRN: [id number] DOB/AGE: November 14, 1945 75 y.o.  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 EXAM: DG C-ARM 1-60 MIN; PELVIS - 1-2 VIEW; RIGHT FEMUR 2 VIEWS FLUOROSCOPY TIME:  Fluoroscopy Time:  31 minutes and 48 seconds Radiation Exposure Index (if provided by the fluoroscopic device): 113 mGy Number of Acquired Spot Images: 0 Number of acquired cine clips: 5 COMPARISON:  None. FINDINGS: Submitted images demonstrate access of the right common femoral artery with subsequent opacification of the right common and external iliac artery. Multifocal high-grade stenoses noted in the right common and external iliac arteries which improved significantly status post stent placement. High-grade stenosis with complete to near complete occlusion also present in the distal SFA and popliteal arteries. IMPRESSION: Intraoperative of angiogram of the right lower extremity as above. Electronically Signed   By: Acquanetta Belling M.D.   On: 06/06/2021 10:08   DG Pelvis Portable  Result Date: 06/05/2021 CLINICAL DATA:  Elective surgery. Evaluate for retained foreign body/needle. EXAM: PORTABLE PELVIS 1-2 VIEWS COMPARISON:  None. FINDINGS: No evidence of retained surgical instrument, foreign body or needle. There is a vascular stent in the region of the right common iliac vessels. Foley catheter projects over the pelvis. No pelvic  fracture or acute osseous abnormality. IMPRESSION: No evidence of retained surgical instrument, foreign body or needle. Vascular stent in the region of the right iliac vessels. Results called to the operating room at the time of the exam, 20:10 on 06/05/2021 Electronically Signed   By: Narda Rutherford M.D.   On: 06/05/2021 20:10   DG C-Arm 1-60 Min  Result Date: 06/06/2021 CLINICAL DATA:  Insertion of right iliac and superficial femoral artery stent. EXAM: DG C-ARM 1-60 MIN; PELVIS - 1-2 VIEW; RIGHT FEMUR 2 VIEWS FLUOROSCOPY TIME:  Fluoroscopy Time:  31 minutes and 48 seconds Radiation Exposure Index (if provided by the fluoroscopic device): 113 mGy Number of Acquired Spot Images: 0 Number of acquired cine clips: 5 COMPARISON:  None. FINDINGS: Submitted images demonstrate access of the right common femoral artery with subsequent opacification of the right common and external iliac artery. Multifocal high-grade stenoses noted in the right common and external iliac arteries which improved significantly status post stent placement. High-grade stenosis with complete to near complete occlusion also present in the distal SFA and popliteal arteries. IMPRESSION: Intraoperative of angiogram of the right lower extremity as above. Electronically Signed   By: Acquanetta Belling M.D.   On: 06/06/2021 10:08   VAS [place] LOWER EXTREMITY SAPHENOUS VEIN MAPPING  Result Date: 06/08/2021 LOWER EXTREMITY VEIN MAPPING Patient Name:  Todd Duran  Date of Exam:   06/08/2021 Medical Rec #: [id number]        Accession #:    [id number] Date of Birth: 04-Nov-1946        Patient Gender: M Patient Age:   74Y Exam Location:  [facility] Procedure:      VAS [place] LOWER EXTREMITY SAPHENOUS VEIN MAPPING Referring Phys: [id number] Nada Libman --------------------------------------------------------------------------------  Indications:  Pre-op Risk Factors:       PAD. Other Risk Factors: Left BKA.  Comparison Study: No prior study Performing  Technologist: Sherren Kerns RVS  Examination Guidelines: A complete evaluation includes B-mode imaging, spectral Doppler, color Doppler, and power Doppler as needed of all accessible portions of each vessel. Bilateral testing is considered an integral part of a complete examination. Limited examinations for reoccurring indications may be performed as noted. +---------------+-----------+----------------------+---------------+-----------+   RT Diameter  RT Findings         GSV            LT Diameter  LT Findings      (cm)                                            (cm)                  +---------------+-----------+----------------------+---------------+-----------+      0.56                     Saphenofemoral         0.44       branching                                   Junction                                  +---------------+-----------+----------------------+---------------+-----------+      0.56                     Proximal thigh         0.40                  +---------------+-----------+----------------------+---------------+-----------+      0.42       branching       Mid thigh            0.41                  +---------------+-----------+----------------------+---------------+-----------+      0.42       branching      Distal thigh          0.44                  +---------------+-----------+----------------------+---------------+-----------+      0.37                          Knee                            BKA     +---------------+-----------+----------------------+---------------+-----------+      0.25       branching       Prox calf                          BKA     +---------------+-----------+----------------------+---------------+-----------+      0.22                        Mid calf  BKA     +---------------+-----------+----------------------+---------------+-----------+      0.22                      Distal calf                          BKA     +---------------+-----------+----------------------+---------------+-----------+      0.20       branching         Ankle                            BKA     +---------------+-----------+----------------------+---------------+-----------+    Preliminary    DG FEMUR, MIN 2 VIEWS RIGHT  Result Date: 06/06/2021 CLINICAL DATA:  Insertion of right iliac and superficial femoral artery stent. EXAM: DG C-ARM 1-60 MIN; PELVIS - 1-2 VIEW; RIGHT FEMUR 2 VIEWS FLUOROSCOPY TIME:  Fluoroscopy Time:  31 minutes and 48 seconds Radiation Exposure Index (if provided by the fluoroscopic device): 113 mGy Number of Acquired Spot Images: 0 Number of acquired cine clips: 5 COMPARISON:  None. FINDINGS: Submitted images demonstrate access of the right common femoral artery with subsequent opacification of the right common and external iliac artery. Multifocal high-grade stenoses noted in the right common and external iliac arteries which improved significantly status post stent placement. High-grade stenosis with complete to near complete occlusion also present in the distal SFA and popliteal arteries. IMPRESSION: Intraoperative of angiogram of the right lower extremity as above. Electronically Signed   By: Acquanetta Belling M.D.   On: 06/06/2021 10:08   VAS US CAROTID  Result Date: 06/08/2021 Carotid Arterial Duplex Study Patient Name:  Todd Duran  Date of Exam:   06/08/2021 Medical Rec #: [id number]        Accession #:    [id number] Date of Birth: 12/21/1945        Patient Gender: M Patient Age:   74Y Exam Location:  [facility] Procedure:      VAS US CAROTID Referring Phys: [id number] Nada Libman --------------------------------------------------------------------------------  Indications:       PAD. Risk Factors:      Hypertension, Diabetes, PAD. Comparison Study:  No prior study Performing Technologist: Sherren Kerns RVS  Examination Guidelines: A complete evaluation includes B-mode  imaging, spectral Doppler, color Doppler, and power Doppler as needed of all accessible portions of each vessel. Bilateral testing is considered an integral part of a complete examination. Limited examinations for reoccurring indications may be performed as noted.  Right Carotid Findings: +----------+--------+--------+--------+------------------+--------+           PSV cm/sEDV cm/sStenosisPlaque DescriptionComments +----------+--------+--------+--------+------------------+--------+ CCA Prox  94      18              heterogenous               +----------+--------+--------+--------+------------------+--------+ CCA Distal87      20              heterogenous               +----------+--------+--------+--------+------------------+--------+ ICA Prox  103     29              heterogenous               +----------+--------+--------+--------+------------------+--------+ ICA Distal61      19                                         +----------+--------+--------+--------+------------------+--------+  ECA       194     20                                         +----------+--------+--------+--------+------------------+--------+ +----------+--------+-------+--------+-------------------+           PSV cm/sEDV cmsDescribeArm Pressure (mmHG) +----------+--------+-------+--------+-------------------+ [id number]                                         +----------+--------+-------+--------+-------------------+ +---------+--------+--+--------+-+ VertebralPSV cm/s29EDV cm/s5 +---------+--------+--+--------+-+  Left Carotid Findings: +----------+--------+--------+--------+------------------+------------------+           PSV cm/sEDV cm/sStenosisPlaque DescriptionComments           +----------+--------+--------+--------+------------------+------------------+ CCA Prox  109     21                                intimal thickening  +----------+--------+--------+--------+------------------+------------------+ CCA Distal93      20                                intimal thickening +----------+--------+--------+--------+------------------+------------------+ ICA Prox  91      31              heterogenous                         +----------+--------+--------+--------+------------------+------------------+ ICA Distal85      35                                                   +----------+--------+--------+--------+------------------+------------------+ ECA       218     19                                                   +----------+--------+--------+--------+------------------+------------------+ +----------+--------+--------+--------+-------------------+           PSV cm/sEDV cm/sDescribeArm Pressure (mmHG) +----------+--------+--------+--------+-------------------+ [id number]                                         +----------+--------+--------+--------+-------------------+ +---------+--------+--+--------+--+ VertebralPSV cm/s89EDV cm/s22 +---------+--------+--+--------+--+   Summary: Right Carotid: Velocities in the right ICA are consistent with a 1-39% stenosis. Left Carotid: Velocities in the left ICA are consistent with a 1-39% stenosis. Vertebrals:  Bilateral vertebral arteries demonstrate antegrade flow. Subclavians: Normal flow hemodynamics were seen in bilateral subclavian              arteries. *See table(s) above for measurements and observations.     Preliminary    CT Angio Abd/Pel w/ and/or w/o  Result Date: 06/27/2021 CLINICAL DATA:  Hematoma in the right groin. EXAM: CTA ABDOMEN AND PELVIS WITHOUT AND WITH CONTRAST TECHNIQUE: Multidetector CT imaging of the abdomen and pelvis was performed using the standard protocol during bolus administration of intravenous contrast. Multiplanar reconstructed images and MIPs were obtained  and reviewed to evaluate the vascular anatomy. CONTRAST:   19mL OMNIPAQUE IOHEXOL 350 MG/ML SOLN COMPARISON:  None. FINDINGS: VASCULAR Aorta: Noncontrast images demonstrate prominent diffuse calcification of the aorta and iliac arteries. Calcification in the central mesenteric vessels. Images obtained during arterial phase after administration of intravenous contrast material demonstrate normal caliber abdominal aorta. Mild mural thrombus. No aneurysm or dissection. Celiac: Patent without evidence of aneurysm, dissection, vasculitis or significant stenosis. SMA: Patent without evidence of aneurysm, dissection, vasculitis or significant stenosis. Renals: Both renal arteries are patent without evidence of aneurysm, dissection, vasculitis, fibromuscular dysplasia or significant stenosis. Small accessory renal arteries bilaterally. IMA: Patent without evidence of aneurysm, dissection, vasculitis or significant stenosis. Inflow: Mural thrombus is demonstrated in the right iliac artery with patency of the vessel. Thrombus extends into the right internal iliac artery which is proximally occluded. Left common iliac and bilateral external iliac arteries are patent. Proximal Outflow: There is hematoma involving the right common femoral artery and measuring 3.6 x 3.9 cm. This could represent a pseudoaneurysm. Active contrast extravasation into the hematoma anterior to the femoral artery is demonstrated, with focal contrast pooling measuring 1.8 cm diameter. Contrast pooling increases on the more delayed phase imaging. This is consistent with active extravasation. Mild infiltration in the subcutaneous fat around the area. No flow is demonstrated in the visualized proximal right superficial femoral artery and limited flow in the deep femoral artery. There appears to be a prominent muscular collateral. Flow is demonstrated in the proximal left femoral artery although there appears to be high-grade calcific stenosis. No flow is demonstrated in the visualized portion of the left  superficial femoral and deep femoral arteries. Veins: No obvious venous abnormality within the limitations of this arterial phase study. Review of the MIP images confirms the above findings. NON-VASCULAR Lower chest: Mild linear atelectasis in the lung bases. Hepatobiliary: No focal liver abnormality is seen. No gallstones, gallbladder wall thickening, or biliary dilatation. Pancreas: Unremarkable. No pancreatic ductal dilatation or surrounding inflammatory changes. Spleen: Normal in size without focal abnormality. Adrenals/Urinary Tract: Adrenal glands are unremarkable. Kidneys are normal, without renal calculi, focal lesion, or hydronephrosis. Bladder is unremarkable. Stomach/Bowel: Stomach, small bowel, and colon are not abnormally distended. Stool fills the colon. No inflammatory changes are appreciated. Lymphatic: No significant lymphadenopathy. Reproductive: Prostate is unremarkable. Other: No free air or free fluid in the abdomen. Abdominal wall musculature appears intact. Musculoskeletal: Degenerative changes in the spine. No destructive bone lesions. IMPRESSION: VASCULAR 1. Hematoma around the right common femoral artery with active contrast extravasation. This likely represents a pseudoaneurysm. 2. Extensive atherosclerotic vascular disease. Aortic atherosclerosis. Prominent calcific atherosclerosis throughout. 3. Absence of flow demonstrated in the superficial femoral and deep femoral arteries bilaterally, and right internal iliac artery. Nonocclusive thrombus demonstrated in the right common femoral artery. NON-VASCULAR No acute process identified. I attempted to call results by telephone at the time of interpretation to the floor, but the patient has been transferred to the operating room. Measurement markers were present on the images prior to my interpretation, suggesting that results were likely discussed with the referring service by another physician prior to images being available for  interpretation. Electronically Signed   By: Burman Nieves M.D.   On: 06/27/2021 17:59    Labs:  Basic Metabolic Panel: Recent Labs  Lab 06/23/21 0103 06/23/21 1758 06/24/21 0518 06/24/21 1822 06/26/21 0716 06/27/21 1849 06/27/21 1942 06/28/21 0113  NA 131*  --  130* 128* 128* 132* 132* 133*  K 4.8  --  4.3 4.5 4.1 4.4 4.7 4.3  CL 99  --  99 96* 96* 101 102 102  CO2 24  --  --   --  22  GLUCOSE 95  --  122* 128* 204* 136* 130* 135*  BUN 20  --  21 23 33* 32* 40* 29*  CREATININE 0.94 0.92 0.97 1.01 1.32* 0.90 0.90 0.87  CALCIUM 9.5  --  9.4 9.3 9.1  --   --  8.6*    CBC: Recent Labs  Lab 06/24/21 0518 06/24/21 1822 06/26/21 0716 06/27/21 1426 06/27/21 1849 06/27/21 1942 06/28/21 0113  WBC 11.2*   < > 12.2* 16.2*  --   --  18.5*  NEUTROABS 8.3*  --   --   --   --   --   --   HGB 7.9*   < > 8.0* 6.9* 8.2* 9.9* 9.2*  HCT 25.3*   < > 24.2* 21.8* 24.0* 29.0* 26.8*  MCV 84.3   < > 83.2 82.3  --   --  82.0  PLT 604*   < > 532* 473*  --   --  349   < > = values in this interval not displayed.    CBG: Recent Labs  Lab 06/27/21 1111 06/27/21 1625 06/27/21 2043 06/28/21 0805 06/28/21 1129  GLUCAP 200* 142* 121* 142* 135*    Brief HPI:   Todd Duran is a 75 y.o. male right-handed with history of diabetes mellitus hypertension tobacco and alcohol use.  Lives with spouse.  Presented 05/13/2021 with progressive left foot pain x2 months with ischemic changes and findings of decreased femoral pulses bilaterally.  Underwent ultrasound-guided left femoral artery with abdominal aortogram showing bilateral renal artery stenosis.  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 Myra Gianotti.  [facility] course anemia as well as hyponatremia.  He was cleared to begin subcutaneous heparin for DVT prophylaxis.  Therapy evaluations completed he was admitted to inpatient rehab services 05/15/2021  with slow progressive gains.  Close monitoring of BKA site noted to be boggy necrotic area concerning along incision line with vascular surgery notified for follow-up.  A few of the patient's staples were removed noted underlying abscess with poor healing and noted increased necrosis.  He was discharged to acute care services 05/20/2021 and underwent left AKA per Dr. Chestine Spore.  Postoperative course anemia transfused 2 units packed red blood cells.  He was readmitted back to inpatient rehab services 05/25/2021 he was doing well after his left AKA however noted ischemic changes to the right fifth toe with ulceration and felt that amputation was imminent as well as need for revascularization thus he was discharged back to acute care services 06/05/2021 and underwent right iliofemoral enterectomy with bovine patch angioplasty as well as right fifth toe amputation including metatarsal head per Dr. Myra Gianotti.  Nonweightbearing left lower extremity weightbearing as tolerated right lower extremity with Darco shoe boot.  Patient with persistent ischemic changes on the dorsum of the right foot following his bypass underwent redo right femoral artery exposure right common femoral to above-knee popliteal artery bypass graft with ipsilateral and on reverse greater saphenous vein as well as right fourth and fifth toe amputation including metatarsal head 06/10/2021 unfortunately his foot remains significantly ischemic with foul odor necrotic tissue and underwent right BKA 06/21/2021 per Dr. Myra Gianotti.  He again was cleared to begin subcutaneous heparin for DVT prophylaxis.  Acute blood loss anemia monitored.  Therapy evaluations completed he was readmitted for a comprehensive rehab program.   Hospital Course: Todd Duran was admitted to rehab 06/23/2021 for inpatient therapies to consist of PT, ST and OT at least three hours five days a week. Past admission physiatrist, therapy team and rehab RN have worked together to provide customized  collaborative inpatient rehab.  Pertaining patient's left AKA 05/20/2021 and now right BKA 06/21/2021 followed closely by vascular surgery.  He had been cleared for subcutaneous heparin for DVT prophylaxis.  Aspirin Plavix ongoing as directed.  Neurontin for pain control as well as oxycodone.  Blood sugars monitored hemoglobin A1c 5.2.  Blood pressure with Norvasc Tenormin as directed.  Bouts of hyponatremia 128 mild fluid restriction initiated started on normal saline IV fluids.  On 06/27/2021 patient developed some bleeding from the groin hemoglobin of 6.9.  Patient typed and crossed.  CT showing pseudoaneurysm right femoral artery.  Vascular surgery consulted patient was discharged to acute care services for ongoing evaluation.   Blood pressures were monitored on TID basis and soft and monitored  Diabetes has been monitored with ac/hs CBG checks and SSI was use prn for tighter BS control.    Rehab course: During patient's stay in rehab weekly team conferences were held to monitor patient's progress, set goals and discuss barriers to discharge. At admission, patient required minimal guard lateral scoot transfers modified independent supine to sit  Physical exam Blood pressure 126/78 pulse 87 temperature 98.5 respirations 13 oxygen saturation 100% room air Constitutional.  Appearance.  Ill-appearing. HEENT Head.  Normocephalic and atraumatic Eyes.  Pupils round and reactive to light no discharge without nystagmus Neck.  Supple nontender no JVD without thyromegaly Cardiac regular rate and rhythm without extra sounds or murmur heard Abdomen.  Soft nontender positive bowel sounds without rebound Skin.  Left AKA well-healed.  Right BKA with limb guard protector.  He had some mild drainage from groin site. Neurologic.  Alert oriented mood was flat but appropriate.  Sitting up in bed.  Follows commands.  Motor 5/5 upper extremities.  Right lower extremity 3-4/5 with limited somewhat by pain.  Left lower  extremity 4/5  He/She  has had improvement in activity tolerance, balance, postural control as well as ability to compensate for deficits. He/She has had improvement in functional use RUE/LUE  and RLE/LLE as well as improvement in awareness.  Patient was able to lift bottom and reciprocally scoot back into chair with contact-guard assist and increased time.  Grooming task completed from wheelchair at sink with set up.  Patient was able to don scrub top with min assist.  He was somewhat lethargic during his therapies.       Disposition: Discharged to acute care services    Diet: Regular  Special Instructions: As dictated per acute care services  30-35 minutes were spent completing discharge summary and discharge planning   Allergies as of 06/27/2021       Reactions   Lisinopril Swelling   Swelling of lips        Medication List     ASK your doctor about these medications    amLODipine 10 MG tablet Commonly known as: NORVASC Take 10 mg by mouth daily.   aspirin 81 MG EC tablet Take 1 tablet (81 mg total) by mouth daily. Swallow whole.   atenolol 25 MG tablet Commonly known as: TENORMIN Take 1 tablet (25 mg total) by mouth 2 (two) times daily.   atorvastatin 20 MG  tablet Commonly known as: LIPITOR Take 20 mg by mouth at bedtime.   clopidogrel 75 MG tablet Commonly known as: PLAVIX Take 1 tablet (75 mg total) by mouth daily.   cyanocobalamin 1000 MCG tablet Take 1,000 mcg by mouth daily.   diclofenac Sodium 1 % Gel Commonly known as: VOLTAREN Apply 2 g topically 3 (three) times daily.   ferrous sulfate 325 (65 FE) MG tablet Take 1 tablet (325 mg total) by mouth daily with breakfast.   folic acid 1 MG tablet Commonly known as: FOLVITE Take 1 tablet (1 mg total) by mouth daily.   gabapentin 100 MG capsule Commonly known as: NEURONTIN Take 1 capsule (100 mg total) by mouth 2 (two) times daily.   hydrochlorothiazide 25 MG tablet Commonly known as:  HYDRODIURIL Take 25 mg by mouth daily.   metFORMIN 500 MG tablet Commonly known as: GLUCOPHAGE Take 500 mg by mouth daily with breakfast.   methocarbamol 750 MG tablet Commonly known as: ROBAXIN Take 750 mg by mouth 4 (four) times daily.   multivitamin with minerals Tabs tablet Take 1 tablet by mouth daily.   oxybutynin 5 MG tablet Commonly known as: DITROPAN Take 0.5 tablets (2.5 mg total) by mouth 2 (two) times daily.   oxyCODONE-acetaminophen 5-325 MG tablet Commonly known as: PERCOCET/ROXICET Take 1-2 tablets by mouth every 4 (four) hours as needed for moderate pain.   polyethylene glycol 17 g packet Commonly known as: MIRALAX / GLYCOLAX Take 17 g by mouth daily as needed for mild constipation.        Follow-up Information     Ranelle Oyster, MD Follow up.   Specialty: Physical Medicine and Rehabilitation Why: Office to call for appointment Contact information: [address] [place] [address] [phone]         Nada Libman, MD Follow up.   Specialties: Vascular Surgery, Cardiology Why: Call for appointment Contact information: [address] [place] [address] [phone]                 Signed: Mcarthur Rossetti Aamira Bischoff 06/28/2021, 1:44 PM

## 2021-06-28 NOTE — Progress Notes (Addendum)
VASCULAR SURGERY:  He has had some mild oozing from both incisions.  There is no significant swelling in the groin so I do not think he is had any major bleeding it looks like just oozing.  Checking a CBC and coags.  In addition I placed an incisional VAC with Mepitel over the skin.  Cari Caraway, MD 3:40 PM

## 2021-06-29 LAB — GLUCOSE, CAPILLARY
Glucose-Capillary: 115 mg/dL — ABNORMAL HIGH (ref 70–99)
Glucose-Capillary: 132 mg/dL — ABNORMAL HIGH (ref 70–99)
Glucose-Capillary: 73 mg/dL (ref 70–99)
Glucose-Capillary: 85 mg/dL (ref 70–99)

## 2021-06-29 NOTE — Progress Notes (Addendum)
Inpatient Rehabilitation Admissions Coordinator    Noted patient readmitted to acute for surgery due to pseudoaneurysm right groin. I will follow to assist with planning dispo when appropriate. Once medically appropriate, likely need to readmit to CIR to complete his rehab recovery.  Ottie Glazier, RN, MSN Rehab Admissions Coordinator [phone] 06/29/2021 8:55 AM

## 2021-06-29 NOTE — Evaluation (Signed)
Occupational Therapy Evaluation Patient Details Name: Todd Duran MRN: [id number] DOB: 06/19/1946 Today's Date: 06/29/2021    History of Present Illness Pt is a 75 yo male admitted on 7/29 from CIR due to R lower limb ischemia. Pt underwent R femoral endarterectomy, R iliac & superficial fem art stent, R 5th toe amp. 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 Transferred to Rehab on 8/16.  Back to Acute on 8/20 due to pseudoaneurysm with exploration. Continued active bleeding on 8/21.  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 Pt presents now limited by R groin pain and confusion (Pt known to this therapist - hx of confusion after procedures) requiring increased assist for transfers/ADLs. Pt overall Mod A for bed mobility, Max A for A-P/scoot transfer to recliner with frequent cues needed to follow directions. Pt overall Min A for UB ADLs and Max A for LB ADLs at this time. Rec DC back to CIR to complete rehab prior to return home.    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 frequent cueing for sequencing and initiation    Transfers Overall transfer level: Needs assistance Equipment  used: None Transfers: Lateral/Scoot Transfers          Lateral/Scoot Transfers: Max assist General transfer comment: Max A for scoot/AP transfer to recliner (pushed up against bed). pt with difficulty following commands and decreased initiation    Balance Overall balance assessment: Needs assistance Sitting-balance support: Feet unsupported;No upper extremity supported Sitting balance-Leahy Scale: Poor Sitting balance - Comments: reliant on UE support, LOB without UE support                                   ADL either performed or assessed with clinical judgement   ADL Overall ADL's : Needs assistance/impaired Eating/Feeding: Set up;Sitting Eating/Feeding Details (indicate cue type and reason): assist to cut food, open containers for breakfast due to confusion and decreased initiation Grooming: Supervision/safety;Bed level;Wash/dry face Grooming Details (indicate cue type and reason): frequent cues to initiate, noted to drop washcloth to floor                 Toilet Transfer: Maximal assistance;Anterior/posterior Statistician Details (indicate cue type and reason): simulated to recliner           General ADL Comments: Pt requiring increased assist for transfers/ADLs due to confusion (hx of confusion after procedures)     Vision Baseline Vision/History: 1 Wears glasses Patient Visual Report: No change from baseline Vision Assessment?: No apparent visual deficits     Perception     Praxis      Pertinent Vitals/Pain Pain Assessment: Faces Faces Pain  Scale: Hurts even more Pain Location: R groin with movement Pain Descriptors / Indicators: Grimacing;Sore Pain Intervention(s): Limited activity within patient's tolerance;Monitored during session;Premedicated before session;Repositioned     Hand Dominance Right   Extremity/Trunk Assessment Upper Extremity Assessment Upper Extremity Assessment: Overall WFL for tasks assessed   Lower Extremity  Assessment Lower Extremity Assessment: Defer to PT evaluation   Cervical / Trunk Assessment Cervical / Trunk Assessment: Normal   Communication Communication Communication: HOH   Cognition Arousal/Alertness: Awake/alert Behavior During Therapy: WFL for tasks assessed/performed Overall Cognitive Status: No family/caregiver present to determine baseline cognitive functioning Area of Impairment: Memory;Awareness;Problem solving;Following commands;Safety/judgement;Attention;Orientation                 Orientation Level: Disoriented to;Time;Situation Current Attention Level: Sustained Memory: Decreased short-term memory Following Commands: Follows one step commands inconsistently;Follows one step commands with increased time Safety/Judgement: Decreased awareness of safety Awareness: Emergent Problem Solving: Slow processing;Requires verbal cues;Difficulty sequencing;Decreased initiation;Requires tactile cues General Comments: noted confusion and difficulty following commands this AM. repetition needed for all commands. hx of confusion after procedures. Cues needed to attend to task   General Comments  VSS on RA    Exercises     Shoulder Instructions      Home Living Family/patient expects to be discharged to:: Private residence Living Arrangements: Spouse/significant other Available Help at Discharge: Available 24 hours/day Type of Home: House Home Access: Stairs to enter [employer] of Steps: 4 Entrance Stairs-Rails: Right;Left Home Layout: Two level;Able to live on main level with bedroom/bathroom Alternate Level Stairs-Number of Steps: Reports his bed/bath is downstairs   Bathroom Shower/Tub: Chief Strategy Officer: Standard Bathroom Accessibility: Yes   Home Equipment: Wheelchair - [employer] - 2 wheels;Cane - single point   Additional Comments: pt was d/c'd from CIR to have surgery. Pt lives with spouse typically who can provide 24/7  assist.      Prior Functioning/Environment Level of Independence: Needs assistance  Gait / Transfers Assistance Needed: working on lateral scoot transfers in CIR ADL's / Homemaking Assistance Needed: working with OT in CIR   Comments: Prior to initial BKA, pt was independent in all tasks        OT Problem List: Decreased strength;Decreased activity tolerance;Impaired balance (sitting and/or standing);Decreased safety awareness;Decreased knowledge of use of DME or AE;Decreased knowledge of precautions;Pain;Decreased cognition      OT Treatment/Interventions: Therapeutic exercise;Self-care/ADL training;Energy conservation;DME and/or AE instruction;Therapeutic activities;Patient/family education;Balance training    OT Goals(Current goals can be found in the care plan section) Acute Rehab OT Goals Patient Stated Goal: go home when ready OT Goal Formulation: With patient Time For Goal Achievement: 07/13/21 Potential to Achieve Goals: Good  OT Frequency: Min 2X/week   Barriers to D/C:            Co-evaluation              AM-PAC OT "6 Clicks" Daily Activity     Outcome Measure Help from another person eating meals?: A Little Help from another person taking care of personal grooming?: A Little Help from another person toileting, which includes using toliet, bedpan, or urinal?: A Lot Help from another person bathing (including washing, rinsing, drying)?: A Lot Help from another person to put on and taking off regular upper body clothing?: A Little Help from another person to put on and taking off regular lower body clothing?: A Lot 6 Click Score: 15   End of Session Nurse Communication: Mobility status  Activity Tolerance: Other (comment) (  limited by cognition) Patient left: in chair;with call bell/phone within reach;Other (comment) (chair pad placed in chair though no alarm box noted in room - RNs notified)  OT Visit Diagnosis: Other abnormalities of gait and mobility  (R26.89);Muscle weakness (generalized) (M62.81);Pain;Other symptoms and signs involving cognitive function Pain - Right/Left: Right Pain - part of body:  (groin)                Time: 9450-3888 OT Time Calculation (min): 37 min Charges:  OT General Charges $OT Visit: 1 Visit OT Evaluation $OT Eval Moderate Complexity: 1 Mod OT Treatments $Therapeutic Activity: 8-22 mins  Bradd Canary, OTR/L Acute Rehab Services Office: [phone]   Lorre Munroe 06/29/2021, 8:15 AM

## 2021-06-29 NOTE — Progress Notes (Signed)
Mobility Specialist: Progress Note   06/29/21 1105  Mobility  Activity Transferred:  Chair to bed  Level of Assistance +2 (takes two people)  Assistive Device None  Mobility Out of bed to chair with meals  Mobility Response Tolerated fair  Mobility performed by Mobility specialist;Other (comment) (PTA Carly)  $Mobility charge 1 Mobility   Pt assisted back to the bed with assistance from PTA and myself. Pt required +2 physical assistance using pad for sliding. Pt back in the bed with bed alarm on. PTA still present in the room.   [facility] Lezette Kitts Mobility Specialist Mobility Specialist Phone: [phone]

## 2021-06-29 NOTE — Progress Notes (Signed)
Physical Therapy Treatment Patient Details Name: Todd Duran MRN: [id number] DOB: November 16, 1945 Today's Date: 06/29/2021    History of Present Illness Pt is a 75 yo male admitted on 7/29 from CIR due to R lower limb ischemia. Pt underwent R femoral endarterectomy, R iliac & superficial fem art stent, R 5th toe amp. 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 Transferred to Rehab on 8/16.  Back to Acute on 8/20 due to pseudoaneurysm with exploration. Continued active bleeding on 8/21. Wound vac placed 8/21 PM.  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    PT Comments    Pt received in chair, lethargic and difficult to assess orientation due to drowsiness but with good participation as able and fair tolerance for anterior scoot training from chair>bed and supine therapeutic exercises. Pt needing increased time and multimodal cues for command following this date due to post-op lethargy (per chart review this has occurred before and previously resolved with time). Plan to assess wheelchair transfers/mobility next date pending mental status/improved alertness. Pt continues to benefit from PT services to progress toward functional mobility goals.    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), confusion, R groin wound vac Restrictions Weight Bearing Restrictions: Yes RLE Weight Bearing: Non weight bearing LLE Weight Bearing: Non weight bearing    Mobility  Bed Mobility Overal bed mobility: Needs  Assistance Bed Mobility: Sit to Supine     Supine to sit: Min assist     General bed mobility comments: from long sit to supine, minA to guide trunk safely to supine    Transfers Overall transfer level: Needs assistance Equipment used: None Transfers: Licensed conveyancer transfers: Max assist;+2 physical assistance;Mod assist   General transfer comment: Increased assist (+2) for return anterior transfer from chair to bed due to bed surface slight incline from lower chair. Pt with difficulty following commands and decreased initiation, needs multimodal cues for lateral weight shifting and transfer pad assist. Pt able to assist somewhat with weight shifting but difficulty hiking hips forward. Initial modA progressing to maxA  Ambulation/Gait                 Stairs             Wheelchair Mobility    Modified Rankin (Stroke Patients Only)       Balance Overall balance assessment: Needs assistance Sitting-balance support: Feet unsupported;No upper extremity supported Sitting balance-Leahy Scale: Poor Sitting balance - Comments: reliant on UE support, LOB without UE support; able to long sit in bed with BUE support and min guard but increased assist with weight shifting                                    Cognition Arousal/Alertness: Lethargic Behavior During Therapy: WFL for tasks assessed/performed (drowsy/lethargic) Overall Cognitive Status: No family/caregiver present to determine baseline cognitive functioning Area of Impairment: Memory;Awareness;Problem solving;Following commands;Safety/judgement;Attention;Orientation  Orientation Level: Disoriented to;Time;Situation Current Attention Level: Sustained Memory: Decreased short-term memory Following Commands: Follows one step commands inconsistently;Follows one step commands with increased time Safety/Judgement: Decreased awareness of  safety Awareness: Intellectual Problem Solving: Slow processing;Requires verbal cues;Difficulty sequencing;Decreased initiation;Requires tactile cues General Comments: Noted confusion/lethargy and difficulty following commands this AM. Repetition needed for all commands and increased time to respohd. Pt has hx of confusion after procedures. Cues needed to attend to task, pt does better with multimodal cues/tactile assist. Pt seems more tired during PT session likely due to fatigue after sitting up in chair 2-3 hours.      Exercises General Exercises - Upper Extremity Shoulder Flexion: Strengthening;Both;10 reps;Supine;AROM General Exercises - Lower Extremity Hip ABduction/ADduction: AAROM;Both;10 reps;AROM;Supine (tactile cues to initiate) Amputee Exercises Hip Flexion/Marching: AAROM;Both;10 reps;Supine (varying assist due to pt fatigue/drowsiness) Chair Push Up:  (pt too drowsy to attempt)    General Comments General comments (skin integrity, edema, etc.): BP 93/59 (71) seated pre-mobility and BP 92/66 (75) supine post-exertion; Noted, SBP goal 100-150 per MD order so RN notified. Pt SpO2 desat to 91% with exertion (inconsistent waveform) but improves to 94% on RA once resting/in supine, no significant dyspnea however. HR 70's bpm.      Pertinent Vitals/Pain Pain Assessment: Faces Faces Pain Scale: Hurts even more Pain Location: R groin with movement (hip flexion seems most painful) Pain Descriptors / Indicators: Grimacing;Sore Pain Intervention(s): Limited activity within patient's tolerance;Monitored during session;Repositioned    Home Living                      Prior Function            PT Goals (current goals can now be found in the care plan section) Acute Rehab PT Goals Patient Stated Goal: go home when ready PT Goal Formulation: With patient Time For Goal Achievement: 07/12/21 Potential to Achieve Goals: Good Progress towards PT goals: Progressing toward  goals    Frequency    Min 3X/week      PT Plan Current plan remains appropriate    Co-evaluation              AM-PAC PT "6 Clicks" Mobility   Outcome Measure  Help needed turning from your back to your side while in a flat bed without using bedrails?: A Little Help needed moving from lying on your back to sitting on the side of a flat bed without using bedrails?: A Lot Help needed moving to and from a bed to a chair (including a wheelchair)?: Total (maxA +2) Help needed standing up from a chair using your arms (e.g., wheelchair or bedside chair)?: Total Help needed to walk in hospital room?: Total Help needed climbing 3-5 steps with a railing? : Total 6 Click Score: 9    End of Session Equipment Utilized During Treatment: Other (comment) (transfer pads) Activity Tolerance: Patient tolerated treatment well Patient left: in bed;with bed alarm set;with call bell/phone within reach Nurse Communication: Mobility status;Other (comment) (SBP below goal of 100-150 mmHg) PT Visit Diagnosis: Other abnormalities of gait and mobility (R26.89);Pain Pain - Right/Left: Right Pain - part of body:  (groin where wound vac placed)     Time: 1050-1110 PT Time Calculation (min) (ACUTE ONLY): 20 min  Charges:  $Therapeutic Activity: 8-22 mins                     Danecia Underdown P., PTA Acute Rehabilitation Services Pager: [phone] Office: [phone]    Dorathy Kinsman  Montrey Buist 06/29/2021, 12:28 PM

## 2021-06-29 NOTE — Progress Notes (Addendum)
[facility] [place]  Subjective  - No new complaints   Objective 125/66 (!) 109 99 F (37.2 C) (Oral) 20 97%  Intake/Output Summary (Last 24 hours) at 06/29/2021 0739 Last data filed at 06/29/2021 0514 Gross per 24 hour  Intake 1020.4 ml  Output 1025 ml  Net -4.6 ml    Right groin incisional vac to good suction  Right BKA incision healing well, warm to touch Left Healed AKA Abdomin soft Lungs non labored breathing   Assessment/Planning: 75 y.o. male is s/p   Retroperitoneal exposure of the right external iliac artery Exploration of right groin for bleeding Repair of bovine pericardial patch angioplasty 1 Day Post-Op  06/21/21 right BKA s/p  right femoral-popliteal bypass graft as well as right femoral endarterectomy and iliac stenting prior surgery  Plan to change vac tomorrow for incisional inspection. HGB now down to 8 asymptomatic from 9.2.  Will redraw CBC.      Mosetta Pigeon 06/29/2021 7:39 AM --  Laboratory Lab Results: Recent Labs    06/28/21 0113 06/28/21 1624  WBC 18.5* 17.9*  HGB 9.2* 8.0*  HCT 26.8* 24.1*  PLT 349 365   BMET Recent Labs    06/27/21 1942 06/28/21 0113  NA 132* 133*  K 4.7 4.3  CL 102 102  CO2  --  22  GLUCOSE 130* 135*  BUN 40* 29*  CREATININE 0.90 0.87  CALCIUM  --  8.6*    COAG No results found for: INR, PROTIME No results found for: PTT   I agree with the above.  Have seen and evaluated patient.  Events of this weekend noted.  This is obviously a very challenging situation.  He has not had any further evidence of bleeding.  His hemoglobin remained stable.  I will need to monitor his white count as it is elevated today.  [name]

## 2021-06-29 NOTE — TOC Progression Note (Addendum)
Transition of Care [facility]) - Progression Note    Patient Details  Name: KELON EASOM MRN: [id number] Date of Birth: 05-Jul-1946  [facility] CM/SW Contact  Leone Haven, RN Phone Number: 06/29/2021, 3:06 PM  Clinical Narrative:    NCM [facility]  for return call with patient's CSW and PCP name. Patient is active with [facility] for [facility], will need orders at dc. Will need wound vac.  NCM made referral to [facility] with [facility]. Will need the [facility] to put in a consult for the wound vac.  15:40 - NCM received call from patient transfer coordinator stating to contact Karleen Dolphin Nurse Navigator at [phone].  This NCM left message for a return call. Awaiting call back.  16:38- NCM informed Lianne Cure PA , need measurments for wound vac, she was in OR and states the wound vac was put on this weekend and she has not gotten the measurements yet , she will send these to [facility] when she look at it today.        Expected Discharge Plan and Services                                                 Social Determinants of Health (SDOH) Interventions    Readmission Risk Interventions No flowsheet data found.

## 2021-06-30 ENCOUNTER — Encounter (HOSPITAL_COMMUNITY): Payer: Self-pay | Admitting: Vascular Surgery

## 2021-06-30 ENCOUNTER — Inpatient Hospital Stay (HOSPITAL_COMMUNITY): Payer: No Typology Code available for payment source | Admitting: Certified Registered Nurse Anesthetist

## 2021-06-30 ENCOUNTER — Encounter (HOSPITAL_COMMUNITY): Admission: EM | Disposition: A | Discharge status: Hospice | Payer: Self-pay | Attending: Surgery

## 2021-06-30 DIAGNOSIS — L7622 Postprocedural hemorrhage and hematoma of skin and subcutaneous tissue following other procedure: Secondary | ICD-10-CM | POA: Diagnosis not present

## 2021-06-30 HISTORY — PX: APPLICATION OF WOUND VAC: SHX5189

## 2021-06-30 LAB — CBC
HCT: 23.5 % — ABNORMAL LOW (ref 39.0–52.0)
Hemoglobin: 7.9 g/dL — ABNORMAL LOW (ref 13.0–17.0)
MCH: 28.1 pg (ref 26.0–34.0)
MCHC: 33.6 g/dL (ref 30.0–36.0)
MCV: 83.6 fL (ref 80.0–100.0)
Platelets: 382 10*3/uL (ref 150–400)
RBC: 2.81 MIL/uL — ABNORMAL LOW (ref 4.22–5.81)
RDW: 16.5 % — ABNORMAL HIGH (ref 11.5–15.5)
WBC: 22.5 10*3/uL — ABNORMAL HIGH (ref 4.0–10.5)
nRBC: 0 % (ref 0.0–0.2)

## 2021-06-30 LAB — POCT I-STAT 7, (LYTES, BLD GAS, ICA,H+H)
Acid-base deficit: 2 mmol/L (ref 0.0–2.0)
Acid-base deficit: 2 mmol/L (ref 0.0–2.0)
Bicarbonate: 23.2 mmol/L (ref 20.0–28.0)
Bicarbonate: 23.5 mmol/L (ref 20.0–28.0)
Calcium, Ion: 1.29 mmol/L (ref 1.15–1.40)
Calcium, Ion: 1.28 mmol/L (ref 1.15–1.40)
HCT: 23 % — ABNORMAL LOW (ref 39.0–52.0)
HCT: 27 % — ABNORMAL LOW (ref 39.0–52.0)
Hemoglobin: 7.8 g/dL — ABNORMAL LOW (ref 13.0–17.0)
Hemoglobin: 9.2 g/dL — ABNORMAL LOW (ref 13.0–17.0)
O2 Saturation: 100 %
O2 Saturation: 100 %
Patient temperature: 36.1
Patient temperature: 35.9
Potassium: 3.9 mmol/L (ref 3.5–5.1)
Potassium: 3.8 mmol/L (ref 3.5–5.1)
Sodium: 132 mmol/L — ABNORMAL LOW (ref 135–145)
Sodium: 133 mmol/L — ABNORMAL LOW (ref 135–145)
TCO2: 24 mmol/L (ref 22–32)
TCO2: 25 mmol/L (ref 22–32)
pCO2 arterial: 36.9 mmHg (ref 32.0–48.0)
pCO2 arterial: 38.5 mmHg (ref 32.0–48.0)
pH, Arterial: 7.403 (ref 7.350–7.450)
pH, Arterial: 7.388 (ref 7.350–7.450)
pO2, Arterial: 348 mmHg — ABNORMAL HIGH (ref 83.0–108.0)
pO2, Arterial: 274 mmHg — ABNORMAL HIGH (ref 83.0–108.0)

## 2021-06-30 LAB — GLUCOSE, CAPILLARY
Glucose-Capillary: 116 mg/dL — ABNORMAL HIGH (ref 70–99)
Glucose-Capillary: 107 mg/dL — ABNORMAL HIGH (ref 70–99)
Glucose-Capillary: 135 mg/dL — ABNORMAL HIGH (ref 70–99)
Glucose-Capillary: 148 mg/dL — ABNORMAL HIGH (ref 70–99)
Glucose-Capillary: 181 mg/dL — ABNORMAL HIGH (ref 70–99)
Glucose-Capillary: 120 mg/dL — ABNORMAL HIGH (ref 70–99)

## 2021-06-30 LAB — BASIC METABOLIC PANEL
Anion gap: 7 (ref 5–15)
BUN: 22 mg/dL (ref 8–23)
CO2: 23 mmol/L (ref 22–32)
Calcium: 8.9 mg/dL (ref 8.9–10.3)
Chloride: 101 mmol/L (ref 98–111)
Creatinine, Ser: 0.72 mg/dL (ref 0.61–1.24)
GFR, Estimated: >60 mL/min (ref >60)
Glucose, Bld: 96 mg/dL (ref 70–99)
Potassium: 3.7 mmol/L (ref 3.5–5.1)
Sodium: 131 mmol/L — ABNORMAL LOW (ref 135–145)

## 2021-06-30 LAB — PROTIME-INR
INR: 1.2 (ref 0.8–1.2)
Prothrombin Time: 15.2 seconds (ref 11.4–15.2)

## 2021-06-30 SURGERY — GROIN EXPOSURE
Anesthesia: General | Site: Groin | Laterality: Right

## 2021-06-30 MED ORDER — PHENYLEPHRINE HCL-NACL 20-0.9 MG/250ML-% IV SOLN
INTRAVENOUS | Status: DC | PRN
  Administered 2021-06-30: 60 ug/min via INTRAVENOUS

## 2021-06-30 MED ORDER — LACTATED RINGERS IV SOLN: INTRAVENOUS | Status: DC | PRN

## 2021-06-30 MED ORDER — ROCURONIUM BROMIDE 10 MG/ML (PF) SYRINGE
PREFILLED_SYRINGE | INTRAVENOUS | Status: AC
  Filled 2021-06-30: qty 10

## 2021-06-30 MED ORDER — CEFAZOLIN SODIUM-DEXTROSE 2-3 GM-%(50ML) IV SOLR
INTRAVENOUS | Status: DC | PRN
  Administered 2021-06-30: 2 g via INTRAVENOUS

## 2021-06-30 MED ORDER — CHLORHEXIDINE GLUCONATE 0.12 % MT SOLN: 15.0000 mL | Freq: Once | OROMUCOSAL | Status: DC

## 2021-06-30 MED ORDER — FENTANYL CITRATE (PF) 100 MCG/2ML IJ SOLN
INTRAMUSCULAR | Status: AC
  Filled 2021-06-30: qty 2

## 2021-06-30 MED ORDER — PROPOFOL 10 MG/ML IV BOLUS
INTRAVENOUS | Status: DC | PRN
  Administered 2021-06-30: 150 mg via INTRAVENOUS

## 2021-06-30 MED ORDER — HEMOSTATIC AGENTS (NO CHARGE) OPTIME
TOPICAL | Status: DC | PRN
  Administered 2021-06-30: 1 via TOPICAL

## 2021-06-30 MED ORDER — LIDOCAINE 2% (20 MG/ML) 5 ML SYRINGE
INTRAMUSCULAR | Status: AC
  Filled 2021-06-30: qty 5

## 2021-06-30 MED ORDER — FENTANYL CITRATE (PF) 250 MCG/5ML IJ SOLN
INTRAMUSCULAR | Status: DC | PRN
  Administered 2021-06-30: 100 ug via INTRAVENOUS

## 2021-06-30 MED ORDER — 0.9 % SODIUM CHLORIDE (POUR BTL) OPTIME
TOPICAL | Status: DC | PRN
  Administered 2021-06-30 (×3): 1000 mL

## 2021-06-30 MED ORDER — LACTATED RINGERS IV SOLN: INTRAVENOUS | Status: DC

## 2021-06-30 MED ORDER — SUCCINYLCHOLINE CHLORIDE 200 MG/10ML IV SOSY
PREFILLED_SYRINGE | INTRAVENOUS | Status: DC | PRN
  Administered 2021-06-30: 120 mg via INTRAVENOUS

## 2021-06-30 MED ORDER — CHLORHEXIDINE GLUCONATE 0.12 % MT SOLN
OROMUCOSAL | Status: AC
  Filled 2021-06-30: qty 15

## 2021-06-30 MED ORDER — OXYCODONE HCL 5 MG PO TABS: 5.0000 mg | ORAL_TABLET | Freq: Once | ORAL | Status: DC | PRN

## 2021-06-30 MED ORDER — DEXAMETHASONE SODIUM PHOSPHATE 10 MG/ML IJ SOLN
INTRAMUSCULAR | Status: DC | PRN
  Administered 2021-06-30: 4 mg via INTRAVENOUS

## 2021-06-30 MED ORDER — MIDAZOLAM HCL 2 MG/2ML IJ SOLN
INTRAMUSCULAR | Status: AC
  Filled 2021-06-30: qty 2

## 2021-06-30 MED ORDER — DEXAMETHASONE SODIUM PHOSPHATE 10 MG/ML IJ SOLN
INTRAMUSCULAR | Status: AC
  Filled 2021-06-30: qty 1

## 2021-06-30 MED ORDER — ONDANSETRON HCL 4 MG/2ML IJ SOLN
INTRAMUSCULAR | Status: AC
  Filled 2021-06-30: qty 2

## 2021-06-30 MED ORDER — HEPARIN 6000 UNIT IRRIGATION SOLUTION
Status: DC | PRN
  Administered 2021-06-30: 1

## 2021-06-30 MED ORDER — FENTANYL CITRATE (PF) 100 MCG/2ML IJ SOLN
25.0000 ug | INTRAMUSCULAR | Status: DC | PRN
  Administered 2021-06-30 (×2): 25 ug via INTRAVENOUS
  Administered 2021-06-30: 50 ug via INTRAVENOUS

## 2021-06-30 MED ORDER — LIDOCAINE 2% (20 MG/ML) 5 ML SYRINGE
INTRAMUSCULAR | Status: DC | PRN
  Administered 2021-06-30: 40 mg via INTRAVENOUS

## 2021-06-30 MED ORDER — ONDANSETRON HCL 4 MG/2ML IJ SOLN
4.0000 mg | Freq: Four times a day (QID) | INTRAMUSCULAR | Status: DC | PRN
Start: 2021-06-30 — End: 2021-06-30

## 2021-06-30 MED ORDER — OXYCODONE HCL 5 MG/5ML PO SOLN: 5.0000 mg | Freq: Once | ORAL | Status: DC | PRN

## 2021-06-30 MED ORDER — SUGAMMADEX SODIUM 200 MG/2ML IV SOLN
INTRAVENOUS | Status: DC | PRN
  Administered 2021-06-30: 125 mg via INTRAVENOUS

## 2021-06-30 MED ORDER — SODIUM CHLORIDE 0.9 % IV SOLN: INTRAVENOUS | Status: DC | PRN

## 2021-06-30 MED ORDER — ORAL CARE MOUTH RINSE: 15.0000 mL | Freq: Once | OROMUCOSAL | Status: DC

## 2021-06-30 MED ORDER — ROCURONIUM BROMIDE 10 MG/ML (PF) SYRINGE
PREFILLED_SYRINGE | INTRAVENOUS | Status: DC | PRN
  Administered 2021-06-30: 40 mg via INTRAVENOUS

## 2021-06-30 MED ORDER — FENTANYL CITRATE (PF) 250 MCG/5ML IJ SOLN
INTRAMUSCULAR | Status: AC
  Filled 2021-06-30: qty 5

## 2021-06-30 MED ORDER — PROPOFOL 10 MG/ML IV BOLUS
INTRAVENOUS | Status: AC
  Filled 2021-06-30: qty 40

## 2021-06-30 MED ORDER — ONDANSETRON HCL 4 MG/2ML IJ SOLN
INTRAMUSCULAR | Status: DC | PRN
Start: 2021-06-30 — End: 2021-06-30
  Administered 2021-06-30: 4 mg via INTRAVENOUS

## 2021-06-30 SURGICAL SUPPLY — 47 items
ADH SKN CLS APL DERMABOND .7 (GAUZE/BANDAGES/DRESSINGS) ×1
ADH SRG 12 PREFL SYR 3 SPRDR (MISCELLANEOUS) ×1
CANISTER SUCT 3000ML PPV (MISCELLANEOUS) ×2 IMPLANT
CANISTER WOUND CARE 500ML ATS (WOUND CARE) ×1 IMPLANT
CLIP VESOCCLUDE MED 24/CT (CLIP) ×2 IMPLANT
CLIP VESOCCLUDE SM WIDE 24/CT (CLIP) ×2 IMPLANT
DERMABOND ADVANCED (GAUZE/BANDAGES/DRESSINGS) ×1
DERMABOND ADVANCED .7 DNX12 (GAUZE/BANDAGES/DRESSINGS) ×1 IMPLANT
DRAPE INCISE IOBAN 66X45 STRL (DRAPES) ×1 IMPLANT
DRAPE WARM FLUID 44X44 (DRAPES) ×1 IMPLANT
DRESSING PEEL AND PLC PRVNA 13 (GAUZE/BANDAGES/DRESSINGS) IMPLANT
DRSG PEEL AND PLACE PREVENA 13 (GAUZE/BANDAGES/DRESSINGS) ×2
GLOVE SRG 8 PF TXTR STRL LF DI (GLOVE) ×1 IMPLANT
GLOVE SURG UNDER LTX SZ7.5 (GLOVE) ×2 IMPLANT
GLOVE SURG UNDER POLY LF SZ8 (GLOVE) ×2
GOWN STRL REUS W/ TWL LRG LVL3 (GOWN DISPOSABLE) ×2 IMPLANT
GOWN STRL REUS W/ TWL XL LVL3 (GOWN DISPOSABLE) ×2 IMPLANT
GOWN STRL REUS W/TWL LRG LVL3 (GOWN DISPOSABLE) ×4
GOWN STRL REUS W/TWL XL LVL3 (GOWN DISPOSABLE) ×4
KIT BASIN OR (CUSTOM PROCEDURE TRAY) ×2 IMPLANT
KIT DRSG PREVENA PLUS 7DAY 125 (MISCELLANEOUS) ×1 IMPLANT
KIT TURNOVER KIT B (KITS) ×2 IMPLANT
NS IRRIG 1000ML POUR BTL (IV SOLUTION) ×4 IMPLANT
PACK CV ACCESS (CUSTOM PROCEDURE TRAY) ×1 IMPLANT
PACK PERIPHERAL VASCULAR (CUSTOM PROCEDURE TRAY) ×1 IMPLANT
PACK UNIVERSAL I (CUSTOM PROCEDURE TRAY) ×1 IMPLANT
PAD ARMBOARD 7.5X6 YLW CONV (MISCELLANEOUS) ×2 IMPLANT
PATCH VASC XENOSURE 1CMX6CM (Vascular Products) ×2 IMPLANT
PATCH VASC XENOSURE 1X6 (Vascular Products) IMPLANT
STAPLER VISISTAT 35W (STAPLE) ×1 IMPLANT
SUT MNCRL AB 4-0 PS2 18 (SUTURE) ×3 IMPLANT
SUT PDS AB 1 CTX 36 (SUTURE) ×1 IMPLANT
SUT PROLENE 4 0 RB 1 (SUTURE) ×18
SUT PROLENE 4 0 SH DA (SUTURE) ×1 IMPLANT
SUT PROLENE 4-0 RB1 18X2 ARM (SUTURE) IMPLANT
SUT PROLENE 5 0 C 1 24 (SUTURE) ×3 IMPLANT
SUT PROLENE 6 0 BV (SUTURE) ×1 IMPLANT
SUT VIC AB 2-0 CT1 27 (SUTURE) ×4
SUT VIC AB 2-0 CT1 TAPERPNT 27 (SUTURE) ×1 IMPLANT
SUT VIC AB 3-0 SH 27 (SUTURE) ×4
SUT VIC AB 3-0 SH 27X BRD (SUTURE) ×1 IMPLANT
SUT VICRYL 4-0 PS2 18IN ABS (SUTURE) ×1 IMPLANT
SWAB COLLECTION DEVICE MRSA (MISCELLANEOUS) ×1 IMPLANT
SWAB CULTURE ESWAB REG 1ML (MISCELLANEOUS) ×1 IMPLANT
SYR 10ML KIT SKIN ADHESIVE (MISCELLANEOUS) ×1 IMPLANT
TOWEL GREEN STERILE (TOWEL DISPOSABLE) ×2 IMPLANT
WATER STERILE IRR 1000ML POUR (IV SOLUTION) ×2 IMPLANT

## 2021-06-30 NOTE — Transfer of Care (Signed)
Immediate Anesthesia Transfer of Care Note  Patient: Todd Duran  Procedure(s) Performed: Drucie Ip EXPLORATION (Right: Groin) APPLICATION OF PREVENA WOUND VAC (Right: Groin)  Patient Location: PACU  Anesthesia Type:General  Level of Consciousness: oriented, drowsy and patient cooperative  Airway & Oxygen Therapy: Patient Spontanous Breathing and Patient connected to face mask oxygen  Post-op Assessment: Report given to RN and Post -op Vital signs reviewed and stable  Post vital signs: Reviewed  Last Vitals:  Vitals Value Taken Time  BP 154/104 06/30/21 1211  Temp    Pulse 101 06/30/21 1215  Resp 17 06/30/21 1215  SpO2 97 % 06/30/21 1215  Vitals shown include unvalidated device data.  Last Pain:  Vitals:   06/30/21 0744  TempSrc: Oral  PainSc: 10-Worst pain ever         Complications: No notable events documented.

## 2021-06-30 NOTE — Progress Notes (Signed)
Pt arrived via floor bed to short stay, active bleeding from R groin, per RN from the floor, pt had a bite of oatmeal to eat a couple hours prior d/t unaware of pt emergent surgery. Dr. Chaney Malling and CRNA to bedside immediately, pt came down with nursing student holding pressure to R groin, pt VSS. Dr. Chaney Malling aware of pt condition, pt labs, and pt lack of NPO status. Anesthesia team brought pt straightback after arrival to short stay.

## 2021-06-30 NOTE — Progress Notes (Addendum)
[facility] [place]  Subjective  - doing OK over all.     Objective 110/64 80 98.6 F (37 C) (Oral) 17 100%  Intake/Output Summary (Last 24 hours) at 06/30/2021 0808 Last data filed at 06/30/2021 0558 Gross per 24 hour  Intake 180 ml  Output 950 ml  Net -770 ml    Right BKA healing well, left AKA healing well both well perfused Right LQ continued bleeding, wound vac reapplied to incision.  No heamatoma Right groin femoral incision without drainage left open. Vac OP 100 cc HGB now 7.9 acute blood loss anemia Lungs non labored breathing   Assessment/Planning: Retroperitoneal exposure of the right external iliac artery Exploration of right groin for bleeding Repair of bovine pericardial patch angioplasty 1 Day Post-Op  06/21/21 right BKA s/p  right femoral-popliteal bypass graft as well as right femoral endarterectomy and iliac stenting prior surgery  Will recheck HH in the am.  He may require transfusion.  Bloody drainage right LQ incision continue wound vac Pending CIR verses home when medically stable  Mosetta Pigeon 06/30/2021 8:08 AM --  Laboratory Lab Results: Recent Labs    06/28/21 1624 06/30/21 0127  WBC 17.9* 22.5*  HGB 8.0* 7.9*  HCT 24.1* 23.5*  PLT 365 382   BMET Recent Labs    06/28/21 0113 06/30/21 0127  NA 133* 131*  K 4.3 3.7  CL 102 101  CO2 22 23  GLUCOSE 135* 96  BUN 29* 22  CREATININE 0.87 0.72  CALCIUM 8.6* 8.9    COAG No results found for: INR, PROTIME No results found for: PTT  Came to see patient regarding active bleeing from his right groin.  There is approximately 1 unit of blood in the bed.  I discussed going to the OR for exploration.. I also discussed this with his wife.  This is a life threatening situation.  He may require ligation of his bypass which will likely cause his amputation to not heal.    [name]

## 2021-06-30 NOTE — Anesthesia Procedure Notes (Signed)
Procedure Name: Intubation Date/Time: 06/30/2021 10:15 AM Performed by: Leonor Liv, CRNA Pre-anesthesia Checklist: Patient identified, Emergency Drugs available, Suction available and Patient being monitored Patient Re-evaluated:Patient Re-evaluated prior to induction Oxygen Delivery Method: Circle System Utilized Preoxygenation: Pre-oxygenation with 100% oxygen Induction Type: IV induction, Rapid sequence and Cricoid Pressure applied Laryngoscope Size: Mac and 4 Grade View: Grade I Tube type: Oral Tube size: 7.5 mm Number of attempts: 1 Airway Equipment and Method: Stylet and Oral airway Placement Confirmation: ETT inserted through vocal cords under direct vision, positive ETCO2 and breath sounds checked- equal and bilateral Secured at: 23 cm Tube secured with: Tape Dental Injury: Teeth and Oropharynx as per pre-operative assessment

## 2021-06-30 NOTE — Op Note (Signed)
Patient name: Todd Duran MRN: [id number] DOB: June 20, 1946 Sex: male  06/30/2021 Pre-operative Diagnosis: Bleeding from right groin Post-operative diagnosis:  Same Surgeon:  [name] Assistants:  Clinton Gallant, PA Procedure:   #1: Retroperitoneal right iliac artery exposure   #2: Redo right femoral artery exposure   #3: Revision of right femoral endarterectomy via placement of multiple pledgeted sutures for hemostasis.   #4: Right femoral Prevena wound VAC Anesthesia:  General Blood Loss:  500 cc Specimens: Anaerobic and aerobic cultures were obtained from the groin incision  Findings: T there was bleeding from the patch in the right groin between sutures.  The repair stitches placed by Dr. Edilia Bo appeared to be hemostatic.  There were 2 new bleeding areas 1 on the superior medial aspect of the patch and 1 on the superior lateral aspect of the patch.  These were repaired with 4 oh bovine pericardial pledgeted sutures.  I also placed multiple pledgeted sutures around the femoral patch.  I sent a wound culture.  There was no gross infection.  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  This weekend he had bleeding from his groin.  A CT scan showed a pseudoaneurysm.  He was taken the operating room by Dr. Edilia Bo who obtained hemostasis with suturing of the femoral patch.  Earlier today he developed significant bleeding from his right groin.  I discussed proceeding emergently to the operating room for exploration.  This is a life-threatening and limb threatening situation.  I discussed this with the patient and his wife via telephone  Procedure:  The patient was identified in the holding area and taken to [facility] OR ROOM 12  The patient was  then placed supine on the table. general anesthesia was administered.  The patient was prepped and draped in the usual sterile fashion.  A time out was called and antibiotics were administered.  A PA was necessary to expedite the procedure and assist with technical details.  I began by reopening the right sided retroperitoneal incision.  Once the sutures were removed I identified the right external iliac artery and occluded it with a Henley clamp.  I then reopened the right groin incision and bluntly dissected down to the patch.  Immediately identified 2 areas that were actively bleeding.  1 on the superior medial aspect of the patch and 1 on the superior lateral aspect of the patch.  I obtained hemostasis by placing pledgeted 4-0 Prolene sutures.  Once this was done, the wound was hemostatic.  I copiously irrigated.  Anaerobic and aerobic cultures were sent.  After further exploration of the wound, I elected to place multiple additional pledgeted sutures around the edges of the patch.  I did not feel that removing the patch would be beneficial because of the integrity of the artery.  I felt that if I placed a vein patch that I would run into the same problem.  I then elected to place BioGlue around the anastomoses.  I observe the wound for approximately another 15 minutes.  The wound was then copiously irrigated.  There was no further bleeding.  I then closed the groin incision by reapproximating the deep tissue with 2-0 Vicryl in the subcutaneous tissue with 3-0 Vicryl followed by staples.  The retroperitoneal incision was closed by reapproximating the fascia with #  1 PDS suture in the subcutaneous tissue with 2-0 Vicryl, followed by staples.  The patient was successfully extubated taken recovery room in stable condition.   Disposition: To PACU stable   V. [name], M.D., [facility] [place] Office: [phone] Pager:  [phone]

## 2021-06-30 NOTE — H&P (Signed)
VASCULAR SURGERY:  I was called to see the patient because of bleeding from the right groin.  The bleeding has stopped.  The patient had a large amount of blood on his diaper which apparently bled from the right groin.  On exam there is pulsatility to the groin.  I have reviewed his records.  He was seen in our office on 05/08/2021.  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 On 06/05/2021 he underwent a right iliofemoral endarterectomy with bovine patch angioplasty and stenting of the right common iliac artery and external iliac.  On 06/10/2021 the patient had a redo right femoral artery exposure and a right common femoral artery to above-knee popliteal artery bypass with a vein graft.  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 Dathan Attia.  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   I updated the wife.  His hemoglobin is 6.9.  He is being transfused preoperatively.  Chris Dickson, MD 3:06 PM  

## 2021-06-30 NOTE — Anesthesia Preprocedure Evaluation (Signed)
Anesthesia Evaluation  Patient identified by MRN, date of birth, ID band Patient awake    Reviewed: Allergy & Precautions, H&P , NPO status , Patient's Chart, lab work & pertinent test results  Airway Mallampati: II   Neck ROM: full    Dental   Pulmonary Current Smoker,    breath sounds clear to auscultation       Cardiovascular hypertension, + Peripheral Vascular Disease   Rhythm:regular Rate:Normal     Neuro/Psych    GI/Hepatic   Endo/Other  diabetes, Type 2  Renal/GU      Musculoskeletal   Abdominal   Peds  Hematology  (+) anemia ,   Anesthesia Other Findings   Reproductive/Obstetrics                             Anesthesia Physical Anesthesia Plan  ASA: 3 and emergent  Anesthesia Plan: General   Post-op Pain Management:    Induction: Intravenous  PONV Risk Score and Plan: 1 and Ondansetron, Dexamethasone and Treatment may vary due to age or medical condition  Airway Management Planned: Oral ETT  Additional Equipment:   Intra-op Plan:   Post-operative Plan: Extubation in OR  Informed Consent: I have reviewed the patients History and Physical, chart, labs and discussed the procedure including the risks, benefits and alternatives for the proposed anesthesia with the patient or authorized representative who has indicated his/her understanding and acceptance.     Dental advisory given  Plan Discussed with: CRNA, Anesthesiologist and Surgeon  Anesthesia Plan Comments:         Anesthesia Quick Evaluation

## 2021-06-30 NOTE — Anesthesia Procedure Notes (Signed)
Arterial Line Insertion Start/End8/23/2022 10:16 AM, 06/30/2021 10:18 AM Performed by: Achille Rich, MD, Pearson Grippe, CRNA, CRNA  Patient location: OR. Preanesthetic checklist: patient identified, IV checked, site marked, risks and benefits discussed, surgical consent, monitors and equipment checked, pre-op evaluation, timeout performed and anesthesia consent Lidocaine 1% used for infiltration Left, radial was placed Catheter size: 20 G Hand hygiene performed  and maximum sterile barriers used   Attempts: 1 Procedure performed without using ultrasound guided technique. Following insertion, dressing applied. Post procedure assessment: normal and unchanged  Patient tolerated the procedure well with no immediate complications.

## 2021-07-01 ENCOUNTER — Encounter (HOSPITAL_COMMUNITY): Payer: Self-pay | Admitting: Surgery

## 2021-07-01 LAB — BPAM RBC
Blood Product Expiration Date: 202208272359
Blood Product Expiration Date: 202209192359
Blood Product Expiration Date: 202209212359
Blood Product Expiration Date: 202209212359
Blood Product Expiration Date: 202209212359
Blood Product Expiration Date: 202209212359
Blood Product Expiration Date: 202209222359
Blood Product Expiration Date: 202209222359
Blood Product Expiration Date: 202209242359
Blood Product Expiration Date: 202209242359
ISSUE DATE / TIME: 202208201721
ISSUE DATE / TIME: 202208201721
ISSUE DATE / TIME: 202208201821
ISSUE DATE / TIME: 202208201821
ISSUE DATE / TIME: 202208210755
ISSUE DATE / TIME: 202208211039
ISSUE DATE / TIME: 202208231018
ISSUE DATE / TIME: 202208231018
ISSUE DATE / TIME: 202208231545
Unit Type and Rh: 5100
Unit Type and Rh: 5100
Unit Type and Rh: 5100
Unit Type and Rh: 5100
Unit Type and Rh: 5100
Unit Type and Rh: 5100
Unit Type and Rh: 5100
Unit Type and Rh: 5100
Unit Type and Rh: 5100
Unit Type and Rh: 5100

## 2021-07-01 LAB — TYPE AND SCREEN
ABO/RH(D): O POS
Antibody Screen: NEGATIVE
Unit division: 0
Unit division: 0
Unit division: 0
Unit division: 0
Unit division: 0
Unit division: 0
Unit division: 0
Unit division: 0
Unit division: 0
Unit division: 0

## 2021-07-01 LAB — CBC
HCT: 23.5 % — ABNORMAL LOW (ref 39.0–52.0)
Hemoglobin: 8 g/dL — ABNORMAL LOW (ref 13.0–17.0)
MCH: 28.8 pg (ref 26.0–34.0)
MCHC: 34 g/dL (ref 30.0–36.0)
MCV: 84.5 fL (ref 80.0–100.0)
Platelets: 330 10*3/uL (ref 150–400)
RBC: 2.78 MIL/uL — ABNORMAL LOW (ref 4.22–5.81)
RDW: 15.9 % — ABNORMAL HIGH (ref 11.5–15.5)
WBC: 23.5 10*3/uL — ABNORMAL HIGH (ref 4.0–10.5)
nRBC: 0 % (ref 0.0–0.2)

## 2021-07-01 LAB — GLUCOSE, CAPILLARY
Glucose-Capillary: 99 mg/dL (ref 70–99)
Glucose-Capillary: 157 mg/dL — ABNORMAL HIGH (ref 70–99)
Glucose-Capillary: 128 mg/dL — ABNORMAL HIGH (ref 70–99)
Glucose-Capillary: 99 mg/dL (ref 70–99)

## 2021-07-01 MED ORDER — ORAL CARE MOUTH RINSE
15.0000 mL | Freq: Two times a day (BID) | OROMUCOSAL | Status: DC
  Administered 2021-07-01 – 2021-07-04 (×4): 15 mL via OROMUCOSAL

## 2021-07-01 MED ORDER — FERROUS SULFATE 325 (65 FE) MG PO TABS
325.0000 mg | ORAL_TABLET | Freq: Every day | ORAL | Status: DC
  Administered 2021-07-02 – 2021-07-04 (×3): 325 mg via ORAL
  Filled 2021-07-01 (×3): qty 1

## 2021-07-01 MED ORDER — ATORVASTATIN CALCIUM 40 MG PO TABS
40.0000 mg | ORAL_TABLET | Freq: Every day | ORAL | Status: DC
  Administered 2021-07-01 – 2021-07-02 (×2): 40 mg via ORAL
  Filled 2021-07-01 (×3): qty 1

## 2021-07-01 MED ORDER — ATENOLOL 25 MG PO TABS
25.0000 mg | ORAL_TABLET | Freq: Two times a day (BID) | ORAL | Status: DC
  Administered 2021-07-02 – 2021-07-03 (×3): 25 mg via ORAL
  Filled 2021-07-01 (×4): qty 1

## 2021-07-01 NOTE — TOC Progression Note (Signed)
Transition of Care [facility]) - Progression Note    Patient Details  Name: Todd Duran MRN: [id number] Date of Birth: [birth year]  Transition of Care [facility]) CM/SW Contact  Lawerance Sabal, RN Phone Number: 07/01/2021, 9:59 AM  Clinical Narrative:    Patient transitioned to [facility] yesterday after surgery.  Notified Tracey with [facility]. CIR following for potential return to unit when medically stable.          Expected Discharge Plan and Services                                                 Social Determinants of Health (SDOH) Interventions    Readmission Risk Interventions No flowsheet data found.

## 2021-07-01 NOTE — Progress Notes (Signed)
Occupational Therapy Treatment Patient Details Name: Todd Duran MRN: [id number] DOB: 24-Aug-1946 Today's Date: 07/01/2021    History of present illness Pt is a 75 yo male admitted on 7/29 from CIR due to R lower limb ischemia. Pt underwent R femoral endarterectomy, R iliac & superficial fem art stent, R 5th toe amp. 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 Transferred to Rehab on 8/16.  Back to Acute on 8/20 due to pseudoaneurysm with exploration. Continued active bleeding on 8/21. Wound vac placed 8/21 PM.  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   OT comments  Patient required significant encouragement to participate. He continues to have confusion as he could not answer orientation questions other than his name. Patient required active assist to flex all extremities to promote ROM in hips and shoulders. Patient max assist to roll left and right. Patient limited by pain - he reports pain in right hip and back. Educated patient that he has to ask for pain medication - which he was not aware of. Hopefully patient will be able to participate more when pain better managed.      CIR    Equipment Recommendations  Tub/shower bench;Wheelchair (measurements OT);Wheelchair cushion (measurements OT)    Recommendations for Other Services      Precautions / Restrictions Precautions Precautions: Fall Precaution Comments: L AKA (05/20/2021), R BKA (06/21/2021), confusion, R groin wound vac Restrictions Weight Bearing Restrictions: Yes RLE Weight Bearing: Non weight bearing LLE Weight Bearing: Non weight bearing       Mobility Bed Mobility Overal bed mobility: Needs Assistance Bed Mobility: Rolling Rolling: Max assist         General bed mobility comments: Max assist to roll left and right to get bed pad underneath patient.    Transfers                      Balance                                            ADL either performed or assessed with clinical judgement   ADL                                               Vision   Vision Assessment?: No apparent visual deficits   Perception     Praxis      Cognition Arousal/Alertness: Awake/alert Behavior During Therapy: WFL for tasks assessed/performed Overall Cognitive Status: No family/caregiver present to determine baseline cognitive functioning                                 General Comments: Patient alert to self. Unable to tell me where he is. Does not answer some questions and states instead "I'm doing alright" multiple times.        Exercises Other Exercises Other Exercises: active assist to move each extremity x 5   Shoulder Instructions       General Comments      Pertinent Vitals/ Pain       Pain Assessment: Faces Faces Pain Scale: Hurts whole lot  Pain Location: R groin with movement (hip flexion seems most painful) Pain Descriptors / Indicators: Grimacing;Sore;Guarding Pain Intervention(s): Limited activity within patient's tolerance;Premedicated before session;Monitored during session;Repositioned  Home Living                                          Prior Functioning/Environment              Frequency  Min 2X/week        Progress Toward Goals  OT Goals(current goals can now be found in the care plan section)  Progress towards OT goals: OT to reassess next treatment  Acute Rehab OT Goals Patient Stated Goal: go home when ready OT Goal Formulation: With patient Time For Goal Achievement: 07/13/21 Potential to Achieve Goals: Fair  Plan Discharge plan remains appropriate    Co-evaluation          OT goals addressed during session: Strengthening/ROM      AM-PAC OT "6 Clicks" Daily Activity     Outcome Measure   Help from another person eating meals?: A Little Help from another person taking  care of personal grooming?: A Little Help from another person toileting, which includes using toliet, bedpan, or urinal?: Total Help from another person bathing (including washing, rinsing, drying)?: A Lot Help from another person to put on and taking off regular upper body clothing?: A Little Help from another person to put on and taking off regular lower body clothing?: A Lot 6 Click Score: 14    End of Session    OT Visit Diagnosis: Other abnormalities of gait and mobility (R26.89);Muscle weakness (generalized) (M62.81);Pain;Other symptoms and signs involving cognitive function   Activity Tolerance Patient limited by pain   Patient Left in bed;with call bell/phone within reach;with bed alarm set   Nurse Communication  (okay to see per RN. Also continued to have pain.)        Time: 1610-9604 OT Time Calculation (min): 18 min  Charges: OT General Charges $OT Visit: 1 Visit OT Treatments $Therapeutic Activity: 8-22 mins  Waldron Session, OTR/L Acute Care Rehab Services  Office [phone] Pager: [phone]    Kelli Churn 07/01/2021, 3:42 PM

## 2021-07-01 NOTE — Progress Notes (Addendum)
Pharmacy:  Spoke with Kela Millin, PA-C regarding several medication discrepancies between most recent meds on Rehab and current meds.  Patient has been back and forth from Rehab to inpatient several times since 05/12/21 and medication history does not completely reflect most recent medications.  The following changes were made:   Atenolol 100 mg daily > back to Atenolol 25 mg BID (since 7/5)   Atorvastatin 20 mg qhs > back to 40 mg qhs (increased 7/30)   Stop HCTZ (discontinued 7/11 due to low sodium)   Resume FeSO4 daily with breakfast.  Other discrepancies but not changed:   - on Gabapentin 100 mg TID (had been BID), acceptable   - had been Folate 1mg  daily (ordered via Alcohol Withdrawal order set on 7/5, and was continued)   - had been on Ditropan 2.5 mg BID > we did not discuss, not resumed today.   9/5, Dennie Fetters 07/01/2021 3:19 PM

## 2021-07-01 NOTE — Progress Notes (Addendum)
  Progress Note    07/01/2021 7:51 AM 1 Day Post-Op  Subjective:  no complaints this morning   Vitals:   07/01/21 0303 07/01/21 0700  BP: 109/66 121/76  Pulse: 75 92  Resp: 18 19  Temp: 97.6 F (36.4 C) 97.7 F (36.5 C)  SpO2: 98% 98%   Physical Exam: Cardiac: regular Lungs:  non labored Incisions:  right RP incision is c/d/I/. Prevena vac with good suction to right groin Extremities:  well perfused and warm. Right BKA and left AKA healing well Abdomen:  flat, soft, non tender Neurologic: alert and oriented  CBC    Component Value Date/Time   WBC 23.5 (H) 07/01/2021 0426   RBC 2.78 (L) 07/01/2021 0426   HGB 8.0 (L) 07/01/2021 0426   HCT 23.5 (L) 07/01/2021 0426   PLT 330 07/01/2021 0426   MCV 84.5 07/01/2021 0426   MCH 28.8 07/01/2021 0426   MCHC 34.0 07/01/2021 0426   RDW 15.9 (H) 07/01/2021 0426   LYMPHSABS 1.9 06/24/2021 0518   MONOABS 0.6 06/24/2021 0518   EOSABS 0.2 06/24/2021 0518   BASOSABS 0.1 06/24/2021 0518    BMET    Component Value Date/Time   NA 133 (L) 06/30/2021 1140   K 3.8 06/30/2021 1140   CL 101 06/30/2021 0127   CO2 23 06/30/2021 0127   GLUCOSE 96 06/30/2021 0127   BUN 22 06/30/2021 0127   CREATININE 0.72 06/30/2021 0127   CALCIUM 8.9 06/30/2021 0127   GFRNONAA >60 06/30/2021 0127    INR    Component Value Date/Time   INR 1.2 06/30/2021 1608     Intake/Output Summary (Last 24 hours) at 07/01/2021 0751 Last data filed at 07/01/2021 0305 Gross per 24 hour  Intake 1990 ml  Output 1700 ml  Net 290 ml     Assessment/Plan:  75 y.o. male is s/p  #1: Retroperitoneal right iliac artery exposure                       #2: Redo right femoral artery exposure                       #3: Revision of right femoral endarterectomy via placement of multiple pledgeted sutures for hemostasis.                       #4: Right femoral Prevena wound VAC 1 Day Post-Op   Hemodynamically stable post transfusion of 2 units hgb 8 Prevena vac with  50 cc serosanguinous output Continue to monitor for continuous bleeding Right BKA and left AKA well appearing healing nicely Dispo return to CIR hopefully when medically stable  Graceann Congress, PA-C [facility] [phone] 07/01/2021 7:51 AM  I agree with the above.  I have seen and evaluated the patient.  No further bleeding since surgery yesterday.  [name]

## 2021-07-01 NOTE — Progress Notes (Signed)
Inpatient Rehabilitation Admissions Coordinator   I await postoperative therapy updates to assist with planning rehab dispo needs.  Ottie Glazier, RN, MSN Rehab Admissions Coordinator [phone] 07/01/2021 2:06 PM

## 2021-07-01 NOTE — Anesthesia Postprocedure Evaluation (Signed)
Anesthesia Post Note  Patient: Todd Duran  Procedure(s) Performed: Drucie Ip EXPLORATION (Right: Groin) APPLICATION OF PREVENA WOUND VAC (Right: Groin)     Patient location during evaluation: PACU Anesthesia Type: General Level of consciousness: awake and alert Pain management: pain level controlled Vital Signs Assessment: post-procedure vital signs reviewed and stable Respiratory status: spontaneous breathing, nonlabored ventilation, respiratory function stable and patient connected to nasal cannula oxygen Cardiovascular status: blood pressure returned to baseline and stable Postop Assessment: no apparent nausea or vomiting Anesthetic complications: no   No notable events documented.  Last Vitals:  Vitals:   07/01/21 0303 07/01/21 0700  BP: 109/66 121/76  Pulse: 75 92  Resp: 18 19  Temp: 36.4 C 36.5 C  SpO2: 98% 98%    Last Pain:  Vitals:   07/01/21 0700  TempSrc: Oral  PainSc:                  Arno Cullers S

## 2021-07-01 NOTE — Progress Notes (Signed)
Physical Therapy Treatment Patient Details Name: Todd Duran MRN: [id number] DOB: October 23, 1946 Today's Date: 07/01/2021    History of Present Illness Pt is a 75 yo male admitted on 7/29 from CIR due to R lower limb ischemia. Pt underwent R femoral endarterectomy, R iliac & superficial fem art stent, R 5th toe amp. 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 Transferred to Rehab on 8/16.  Back to Acute on 8/20 due to pseudoaneurysm with exploration. Continued active bleeding on 8/21. Wound vac placed 8/21 PM.  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    PT Comments    Pt received in supine, lethargic and A&O x0, participatory in limited therapy session with increased time and multimodal cues. Session focus on repositioning, importance of mobility, supine therapeutic exercises, and positioning to prevent RLE contracture. Pt noted to have RLE propped up on pillow and instructed NT to keep RLE in neutral hip/knee position when at rest as pt unable to carryover or follow most instructions at this time. Pt following multimodal cues <25% of the time this date and unable to reach upright posture to long sit in bed due to guarding/Rt hip pain. Pt continues to benefit from PT services to progress toward functional mobility goals. DC recs below pending progress/improved alertness.  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 Attempted long sit transfer with HOB elevated and having BUE support of side rails, but pt pushing against rails rather than pulling his trunk forward, pt frequently closing eyes/grimacing and unable to understand therapist instructions. However, in supine with hand over hand assist, pt able to pull up on overhead rail to scoot posterior toward [facility] with maxA assist and bed slightly in trendelenburg ~10*    Transfers     General transfer comment: pt unable, pain limited and unable to follow instructions today for upright seated posture     Balance Overall balance assessment: Needs assistance Sitting-balance support: Feet unsupported;No upper extremity supported Sitting balance-Leahy Scale: Zero Sitting balance - Comments: unable to reach full upright posture, pt pushing away from therapist when his trunk raised upright due to R hip pain;pt would need totalA +2-3 to sit EOB today.          Cognition Arousal/Alertness: Lethargic;Suspect due to medications Behavior During Therapy: Restless;Flat affect Overall Cognitive Status: No family/caregiver present to determine baseline cognitive functioning Area of Impairment: Memory;Awareness;Problem solving;Following commands;Safety/judgement;Attention;Orientation  Orientation Level: Disoriented to;Place;Time;Situation Current Attention Level: Focused Memory: Decreased recall of precautions;Decreased short-term  memory Following Commands: Follows one step commands inconsistently;Follows one step commands with increased time Safety/Judgement: Decreased awareness of safety;Decreased awareness of deficits Awareness: Intellectual Problem Solving: Slow processing;Decreased initiation;Difficulty sequencing;Requires verbal cues;Requires tactile cues General Comments: Pt more confused in PT session this afternoon than in previous session with OT today, unable to answer orientation questions and frequently keeping eyes closed/grimacing due to pain. Pt able to follow some cues for repositioning in supine with hand over hand assist and multimodal cues with increased time, but when attempting to sit him upright in bed, pt guarding and pushing away from therapist in opposite direction; RN notified pt remains confused.      Exercises General Exercises - Upper Extremity Shoulder Flexion: AAROM;Both;10 reps;Supine Elbow Flexion: AAROM;Both;10 reps;Supine Elbow Extension: AAROM;Both;10 reps;Supine Amputee Exercises Hip ABduction/ADduction: AAROM;Both;10 reps;Supine Hip Flexion/Marching: AAROM;Both;10 reps;AROM;Supine (AA on RLE, AROM on LLE) Other Exercises    General Comments General comments (skin integrity, edema, etc.): BP 117/71 (86) SpO2 100% on RA; HR 75-80 bpm      Pertinent Vitals/Pain Pain Assessment: Faces Faces Pain Scale: Hurts whole lot Pain Location: R groin with movement (hip flexion seems most painful) Pain Descriptors / Indicators: Grimacing;Sore;Guarding;Moaning Pain Intervention(s): Limited activity within patient's tolerance;Monitored during session;Repositioned;Ice applied;Other (comment) (RN notified pt remains confused and seems very painful in more upright position)     PT Goals (current goals can now be found in the care plan section) Acute Rehab PT Goals Patient Stated Goal: go home when ready PT Goal Formulation: With patient Time For Goal Achievement: 07/12/21 Progress towards  PT goals: Not progressing toward goals - comment (remains too lethargic/confused to follow commands >25% of the time)    Frequency    Min 3X/week      PT Plan Current plan remains appropriate (pending progress/resolution of confusion)       AM-PAC PT "6 Clicks" Mobility   Outcome Measure  Help needed turning from your back to your side while in a flat bed without using bedrails?: A Lot Help needed moving from lying on your back to sitting on the side of a flat bed without using bedrails?: Total (pt guarding due to pain and unable) Help needed moving to and from a bed to a chair (including a wheelchair)?: Total Help needed standing up from a chair using your arms (e.g., wheelchair or bedside chair)?: Total Help needed to walk in hospital room?: Total Help needed climbing 3-5 steps with a railing? : Total 6 Click Score: 7    End of Session Equipment Utilized During Treatment: Other (comment) (transfer pads) Activity Tolerance: Patient limited by pain;Patient limited by lethargy Patient left: in bed;with bed alarm set;with call bell/phone within reach Nurse Communication: Mobility status;Patient requests pain meds;Other (comment) (pt c/o R hip pain but remains lethargic/confused) PT Visit Diagnosis: Other abnormalities of gait and mobility (R26.89);Pain Pain - Right/Left: Right Pain - part of body: Hip     Time: 1733-1751 PT Time Calculation (min) (ACUTE ONLY): 18 min  Charges:  $Therapeutic Exercise: 8-22 mins                     Clydene Burack P., PTA Acute Rehabilitation Services Pager: [phone] Office: [phone]    Angus Palms 07/01/2021, 6:21 PM

## 2021-07-02 ENCOUNTER — Inpatient Hospital Stay (HOSPITAL_COMMUNITY): Payer: No Typology Code available for payment source

## 2021-07-02 LAB — GLUCOSE, CAPILLARY
Glucose-Capillary: 114 mg/dL — ABNORMAL HIGH (ref 70–99)
Glucose-Capillary: 110 mg/dL — ABNORMAL HIGH (ref 70–99)
Glucose-Capillary: 107 mg/dL — ABNORMAL HIGH (ref 70–99)
Glucose-Capillary: 144 mg/dL — ABNORMAL HIGH (ref 70–99)

## 2021-07-02 LAB — CBC
HCT: 23.8 % — ABNORMAL LOW (ref 39.0–52.0)
Hemoglobin: 7.9 g/dL — ABNORMAL LOW (ref 13.0–17.0)
MCH: 28.4 pg (ref 26.0–34.0)
MCHC: 33.2 g/dL (ref 30.0–36.0)
MCV: 85.6 fL (ref 80.0–100.0)
Platelets: 352 10*3/uL (ref 150–400)
RBC: 2.78 MIL/uL — ABNORMAL LOW (ref 4.22–5.81)
RDW: 16 % — ABNORMAL HIGH (ref 11.5–15.5)
WBC: 24.9 10*3/uL — ABNORMAL HIGH (ref 4.0–10.5)
nRBC: 0 % (ref 0.0–0.2)

## 2021-07-02 LAB — LACTIC ACID, PLASMA
Lactic Acid, Venous: 0.9 mmol/L (ref 0.5–1.9)
Lactic Acid, Venous: 0.7 mmol/L (ref 0.5–1.9)

## 2021-07-02 MED ORDER — TRAMADOL HCL 50 MG PO TABS
50.0000 mg | ORAL_TABLET | Freq: Four times a day (QID) | ORAL | Status: DC | PRN
Start: 2021-07-02 — End: 2021-07-04
  Administered 2021-07-02 – 2021-07-03 (×3): 50 mg via ORAL
  Filled 2021-07-02 (×4): qty 1

## 2021-07-02 MED ORDER — SODIUM CHLORIDE 0.9 % IV SOLN
2.0000 g | Freq: Two times a day (BID) | INTRAVENOUS | Status: DC
  Administered 2021-07-02 – 2021-07-25 (×45): 2 g via INTRAVENOUS
  Filled 2021-07-02 (×46): qty 2

## 2021-07-02 NOTE — Significant Event (Addendum)
Rapid Response Event Note   Reason for Call :  Waxing/waning mentation, drowsy Sinus tachycardia- HR 102 Tachypnea- RR 23  Initial Focused Assessment:  Pt lying in bed, alert. Oriented to person, time. Pt follows commands, delayed. He is moving all extremities appropriately. Lung sounds are clear. Breathing is unlabored. Abdomen is soft. Skin is hot, tenting, pink. Urine is amber, clear.   VS: T 102.72F, BP 109/60 (75), HR 102, RR 22, SpO2 98% on room air CBG: 107 Interventions:  -Tylenol for T 102.72F -Lactic acid  Plan of Care:  -Reevaluate temperature 1 hour following treatment -VS per unit protocol -Follow-up with provider regarding lactic acid results  Call rapid response for additional needs  Event Summary:  MD Notified: Per RN Call Time: 1430- RR RN unable to evaluate pt at time of call Arrival Time: 1515 End Time: 1550  Jennye Moccasin, RN

## 2021-07-02 NOTE — Progress Notes (Signed)
LATE ENTRY ENTERED 07/02/2021  Patient returned to 4E24 from PACU. Wound vac on with continuous suction of 125 mmHg, and dressing c/d/I. Gauze dressing superior to wound vac intact with drainage marked and gauze dressing inferior/medial to the wound vac dressing c/d/I. VSS. Patient drowsy and not answered orientation questions at this time.

## 2021-07-02 NOTE — Progress Notes (Addendum)
[facility] [place]  Subjective  - Sleepy, pain in the right leg   Objective 109/71 92 99.4 F (37.4 C) (Oral) 16 98%  Intake/Output Summary (Last 24 hours) at 07/02/2021 0747 Last data filed at 07/02/2021 2122 Gross per 24 hour  Intake 250 ml  Output 1400 ml  Net -1150 ml    Right BKA stump warm appears viable Right LQ wound vac to suction total Op 50 cc Left AKA healing well Lungs non labored breathing  Assessment/Planning:  75 y.o. male is s/p  #1: Retroperitoneal right iliac artery exposure                       #2: Redo right femoral artery exposure                       #3: Revision of right femoral endarterectomy via placement of multiple pledgeted sutures for hemostasis.                       #4: Right femoral Prevena wound VAC 1 Day Post-Op  Will maintain incisional vac for now Leukocytosis elevated now 24.9 Cultures 75 y.o. male is s/p  #1: Retroperitoneal right iliac artery exposure                       #2: Redo right femoral artery exposure                       #3: Revision of right femoral endarterectomy via placement of multiple pledgeted sutures for hemostasis.                       #4: Right femoral Prevena wound VAC 1 Day Post-Op cultures RARE PSEUDOMONAS AERUGINOSA SUSCEPTIBILITIES TO FOLLOW  He is very sleepy, but in pain.  I have discontinued Oxycodone and started tramadol.  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  HGB 7.9 acute blood loss anemia s/p 3 units PRBC and 1 FFP.  Will observe labs ordered.     Mosetta Pigeon 07/02/2021 7:47 AM --  Laboratory Lab Results: Recent Labs    07/01/21 0426 07/02/21 0222  WBC 23.5* 24.9*  HGB 8.0* 7.9*  HCT 23.5* 23.8*  PLT 330 352   BMET Recent Labs    06/30/21 0127 06/30/21 1023 06/30/21 1140  NA 131* 132* 133*  K 3.7 3.9 3.8  CL 101  --   --   CO2 23  --   --   GLUCOSE 96  --   --   BUN 22  --   --   CREATININE 0.72  --   --   CALCIUM 8.9  --    --     COAG Lab Results  Component Value Date   INR 1.2 06/30/2021   No results found for: PTT     ADDENDUM: Patient was evaluated by myself and Dr. Lenell Antu after rapid response.  Vital signs are stable.  He is somnolent but alert and oriented during conversation.  On exam there is no sign of bleeding.  Abd exam is benign. BLE stumps appear viable.  He has been started back on cefepime based on prior cultures.  Chest xr without significant findings.  Lactate pending.  Tylenol was given for fever.  Nothing further to add currently.  He will be re-evaluated in the morning and  we will consider consulting Hospitalist service to help with leukocytosis and medical management.

## 2021-07-02 NOTE — Progress Notes (Signed)
LATE ENTRY ENTERED 8/25  This RN was informed by nursing student that when she went to round on patient that the disposable bed pad was saturated with blood. RN went to room and observed a large amount of blood draining from around the wound vac dressing into the bed, pooling behind the patient. Wound vac was on and functioning with continuous 125 mmHg suction. Blood pressure stable, but patient's responses were delayed. Pressure was held while PA was notified of bleeding. Shortly after phone call, MD came to bedside and patient was emergently taken down to the OR.

## 2021-07-03 ENCOUNTER — Encounter (HOSPITAL_COMMUNITY): Admission: EM | Disposition: A | Discharge status: Hospice | Payer: Self-pay | Attending: Surgery

## 2021-07-03 ENCOUNTER — Inpatient Hospital Stay (HOSPITAL_COMMUNITY): Payer: No Typology Code available for payment source | Admitting: Anesthesiology

## 2021-07-03 HISTORY — PX: FEMORAL ARTERY EXPLORATION: SHX5160

## 2021-07-03 LAB — BASIC METABOLIC PANEL
Anion gap: 7 (ref 5–15)
BUN: 14 mg/dL (ref 8–23)
CO2: 22 mmol/L (ref 22–32)
Calcium: 8.1 mg/dL — ABNORMAL LOW (ref 8.9–10.3)
Chloride: 106 mmol/L (ref 98–111)
Creatinine, Ser: 0.54 mg/dL — ABNORMAL LOW (ref 0.61–1.24)
GFR, Estimated: >60 mL/min (ref >60)
Glucose, Bld: 131 mg/dL — ABNORMAL HIGH (ref 70–99)
Potassium: 3.1 mmol/L — ABNORMAL LOW (ref 3.5–5.1)
Sodium: 135 mmol/L (ref 135–145)

## 2021-07-03 LAB — CBC
HCT: 22.7 % — ABNORMAL LOW (ref 39.0–52.0)
Hemoglobin: 7.4 g/dL — ABNORMAL LOW (ref 13.0–17.0)
MCH: 28 pg (ref 26.0–34.0)
MCHC: 32.6 g/dL (ref 30.0–36.0)
MCV: 86 fL (ref 80.0–100.0)
Platelets: 304 10*3/uL (ref 150–400)
RBC: 2.64 MIL/uL — ABNORMAL LOW (ref 4.22–5.81)
RDW: 16 % — ABNORMAL HIGH (ref 11.5–15.5)
WBC: 14.7 10*3/uL — ABNORMAL HIGH (ref 4.0–10.5)
nRBC: 0 % (ref 0.0–0.2)

## 2021-07-03 LAB — PREPARE RBC (CROSSMATCH)

## 2021-07-03 LAB — GLUCOSE, CAPILLARY
Glucose-Capillary: 170 mg/dL — ABNORMAL HIGH (ref 70–99)
Glucose-Capillary: 203 mg/dL — ABNORMAL HIGH (ref 70–99)
Glucose-Capillary: 100 mg/dL — ABNORMAL HIGH (ref 70–99)
Glucose-Capillary: 149 mg/dL — ABNORMAL HIGH (ref 70–99)

## 2021-07-03 SURGERY — EXPLORATION, ARTERY, FEMORAL
Anesthesia: General | Site: Groin | Laterality: Right

## 2021-07-03 MED ORDER — HEPARIN 6000 UNIT IRRIGATION SOLUTION
Status: AC
  Filled 2021-07-03: qty 500

## 2021-07-03 MED ORDER — CEFAZOLIN SODIUM-DEXTROSE 2-3 GM-%(50ML) IV SOLR
INTRAVENOUS | Status: DC | PRN
  Administered 2021-07-03: 2 g via INTRAVENOUS

## 2021-07-03 MED ORDER — PHENYLEPHRINE HCL (PRESSORS) 10 MG/ML IV SOLN
INTRAVENOUS | Status: DC | PRN
  Administered 2021-07-03 (×2): 80 ug via INTRAVENOUS

## 2021-07-03 MED ORDER — HYDROMORPHONE HCL 1 MG/ML IJ SOLN
0.2500 mg | INTRAMUSCULAR | Status: DC | PRN
  Administered 2021-07-04: 0.25 mg via INTRAVENOUS

## 2021-07-03 MED ORDER — PROMETHAZINE HCL 25 MG/ML IJ SOLN: 6.2500 mg | INTRAMUSCULAR | Status: DC | PRN

## 2021-07-03 MED ORDER — FENTANYL CITRATE (PF) 250 MCG/5ML IJ SOLN
INTRAMUSCULAR | Status: DC | PRN
  Administered 2021-07-03: 100 ug via INTRAVENOUS

## 2021-07-03 MED ORDER — FENTANYL CITRATE (PF) 250 MCG/5ML IJ SOLN
INTRAMUSCULAR | Status: AC
  Filled 2021-07-03: qty 5

## 2021-07-03 MED ORDER — SUCCINYLCHOLINE 20MG/ML (10ML) SYRINGE FOR MEDFUSION PUMP - OPTIME
INTRAMUSCULAR | Status: DC | PRN
  Administered 2021-07-03: 80 mg via INTRAVENOUS

## 2021-07-03 MED ORDER — SODIUM CHLORIDE 0.9 % IV SOLN: INTRAVENOUS | Status: DC | PRN

## 2021-07-03 MED ORDER — PROPOFOL 10 MG/ML IV BOLUS
INTRAVENOUS | Status: AC
  Filled 2021-07-03: qty 20

## 2021-07-03 MED ORDER — ROCURONIUM 10MG/ML (10ML) SYRINGE FOR MEDFUSION PUMP - OPTIME
INTRAVENOUS | Status: DC | PRN
  Administered 2021-07-03: 50 mg via INTRAVENOUS

## 2021-07-03 MED ORDER — HEPARIN 6000 UNIT IRRIGATION SOLUTION
Status: DC | PRN
  Administered 2021-07-03: 1

## 2021-07-03 MED ORDER — 0.9 % SODIUM CHLORIDE (POUR BTL) OPTIME
TOPICAL | Status: DC | PRN
  Administered 2021-07-03: 1000 mL

## 2021-07-03 MED ORDER — LIDOCAINE HCL (CARDIAC) PF 100 MG/5ML IV SOSY
PREFILLED_SYRINGE | INTRAVENOUS | Status: DC | PRN
  Administered 2021-07-03: 60 mg via INTRAVENOUS

## 2021-07-03 MED ORDER — PHENYLEPHRINE HCL-NACL 20-0.9 MG/250ML-% IV SOLN
INTRAVENOUS | Status: DC | PRN
Start: 2021-07-03 — End: 2021-07-04
  Administered 2021-07-03: 50 ug/min via INTRAVENOUS

## 2021-07-03 MED ORDER — PROPOFOL 10 MG/ML IV BOLUS
INTRAVENOUS | Status: DC | PRN
  Administered 2021-07-03: 70 mg via INTRAVENOUS

## 2021-07-03 SURGICAL SUPPLY — 55 items
BAG COUNTER SPONGE SURGICOUNT (BAG) ×2 IMPLANT
BAG SPNG CNTER NS LX DISP (BAG) ×1
BANDAGE ESMARK 6X9 LF (GAUZE/BANDAGES/DRESSINGS) IMPLANT
BNDG CMPR 9X6 STRL LF SNTH (GAUZE/BANDAGES/DRESSINGS)
BNDG ESMARK 6X9 LF (GAUZE/BANDAGES/DRESSINGS)
CANISTER SUCT 3000ML PPV (MISCELLANEOUS) ×2 IMPLANT
CLIP VESOCCLUDE MED 24/CT (CLIP) ×2 IMPLANT
CLIP VESOCCLUDE SM WIDE 24/CT (CLIP) ×2 IMPLANT
COVER PROBE W GEL 5X96 (DRAPES) IMPLANT
CUFF TOURN SGL QUICK 24 (TOURNIQUET CUFF)
CUFF TOURN SGL QUICK 34 (TOURNIQUET CUFF)
CUFF TOURN SGL QUICK 42 (TOURNIQUET CUFF) IMPLANT
CUFF TRNQT CYL 24X4X16.5-23 (TOURNIQUET CUFF) IMPLANT
CUFF TRNQT CYL 34X4.125X (TOURNIQUET CUFF) IMPLANT
DRAIN CHANNEL 15F RND FF W/TCR (WOUND CARE) IMPLANT
DRAPE C-ARM 42X72 X-RAY (DRAPES) ×2 IMPLANT
DRSG COVADERM 4X8 (GAUZE/BANDAGES/DRESSINGS) ×1 IMPLANT
ELECT REM PT RETURN 9FT ADLT (ELECTROSURGICAL) ×2
ELECTRODE REM PT RTRN 9FT ADLT (ELECTROSURGICAL) ×1 IMPLANT
EVACUATOR SILICONE 100CC (DRAIN) IMPLANT
FELT TEFLON 1X6 (MISCELLANEOUS) ×1 IMPLANT
GAUZE 4X4 16PLY ~~LOC~~+RFID DBL (SPONGE) ×1 IMPLANT
GAUZE SPONGE 4X4 12PLY STRL (GAUZE/BANDAGES/DRESSINGS) ×1 IMPLANT
GLOVE SRG 8 PF TXTR STRL LF DI (GLOVE) ×1 IMPLANT
GLOVE SURG ENC MOIS LTX SZ7.5 (GLOVE) ×2 IMPLANT
GLOVE SURG UNDER POLY LF SZ8 (GLOVE) ×2
GOWN STRL REUS W/ TWL LRG LVL3 (GOWN DISPOSABLE) ×2 IMPLANT
GOWN STRL REUS W/ TWL XL LVL3 (GOWN DISPOSABLE) ×2 IMPLANT
GOWN STRL REUS W/TWL LRG LVL3 (GOWN DISPOSABLE) ×4
GOWN STRL REUS W/TWL XL LVL3 (GOWN DISPOSABLE) ×4
INSERT FOGARTY SM (MISCELLANEOUS) IMPLANT
KIT BASIN OR (CUSTOM PROCEDURE TRAY) ×2 IMPLANT
KIT TURNOVER KIT B (KITS) ×2 IMPLANT
NS IRRIG 1000ML POUR BTL (IV SOLUTION) ×4 IMPLANT
PACK PERIPHERAL VASCULAR (CUSTOM PROCEDURE TRAY) ×2 IMPLANT
PAD ARMBOARD 7.5X6 YLW CONV (MISCELLANEOUS) ×4 IMPLANT
SPONGE T-LAP 18X18 ~~LOC~~+RFID (SPONGE) ×2 IMPLANT
STAPLER VISISTAT 35W (STAPLE) ×1 IMPLANT
SUT ETHILON 3 0 PS 1 (SUTURE) IMPLANT
SUT MNCRL AB 4-0 PS2 18 (SUTURE) ×6 IMPLANT
SUT PROLENE 5 0 C 1 24 (SUTURE) ×11 IMPLANT
SUT PROLENE 6 0 BV (SUTURE) ×3 IMPLANT
SUT PROLENE 7 0 BV 1 (SUTURE) IMPLANT
SUT SILK 2 0 PERMA HAND 18 BK (SUTURE) ×2 IMPLANT
SUT SILK 3 0 (SUTURE)
SUT SILK 3-0 18XBRD TIE 12 (SUTURE) IMPLANT
SUT VIC AB 2-0 CT1 27 (SUTURE) ×6
SUT VIC AB 2-0 CT1 TAPERPNT 27 (SUTURE) ×2 IMPLANT
SUT VIC AB 3-0 SH 27 (SUTURE) ×8
SUT VIC AB 3-0 SH 27X BRD (SUTURE) ×3 IMPLANT
TOWEL GREEN STERILE (TOWEL DISPOSABLE) ×2 IMPLANT
TRAY FOLEY MTR SLVR 16FR STAT (SET/KITS/TRAYS/PACK) ×2 IMPLANT
TUBING EXTENTION W/L.L. (IV SETS) ×2 IMPLANT
UNDERPAD 30X36 HEAVY ABSORB (UNDERPADS AND DIAPERS) ×2 IMPLANT
WATER STERILE IRR 1000ML POUR (IV SOLUTION) ×2 IMPLANT

## 2021-07-03 NOTE — Anesthesia Procedure Notes (Addendum)
Arterial Line Insertion Start/End8/26/2022 11:15 PM, 07/03/2021 11:22 PM Performed by: [name], MD  Patient location: Pre-op. Preanesthetic checklist: patient identified, IV checked, site marked, risks and benefits discussed, surgical consent, monitors and equipment checked, pre-op evaluation, timeout performed and anesthesia consent Lidocaine 1% used for infiltration Right, radial was placed Catheter size: 20 G Hand hygiene performed , maximum sterile barriers used  and Seldinger technique used  Attempts: 1 Procedure performed without using ultrasound guided technique. Following insertion, dressing applied and Biopatch. Post procedure assessment: normal and unchanged  Post procedure complications: second provider assisted, unsuccessful attempts and local hematoma (Attempt on left by CRNA in preop). Patient tolerated the procedure with difficulty.

## 2021-07-03 NOTE — Progress Notes (Signed)
Physical Therapy Treatment Patient Details Name: Todd Duran MRN: [id number] DOB: [birth year] Today's Date: 07/03/2021    History of Present Illness Pt is a 75 yo male admitted on 7/29 from CIR due to R lower limb ischemia. Pt underwent R femoral endarterectomy, R iliac & superficial fem art stent, R 5th toe amp. 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 Transferred to Rehab on 8/16.  Back to Acute on 8/20 due to pseudoaneurysm with exploration. Continued active bleeding on 8/21. Wound vac placed 8/21 PM.  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    PT Comments    Pt still having a difficult time focusing on task and assisting well due to his back and R hip/leg pain.  Emphasis on rolling, transition up via L elbow to sitting.  Unable to assist with scooting due to pain, so used pad to scoot toward and away from the EOB.  Worked on balance, but pain limited pt's ability to assist effectively.    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    Transfers                 General transfer comment: used the bed pad to pivot pt to/from  EOB  Ambulation/Gait             General Gait Details: Not able   Stairs             Wheelchair Mobility    Modified Rankin (Stroke Patients Only)       Balance Overall balance assessment: Needs assistance Sitting-balance support: Feet unsupported;No upper extremity supported;Single extremity supported (Thighs support on the mattress) Sitting balance-Leahy Scale: Zero Sitting balance - Comments: pt consistently listing posteriorly and unable to come forward to full upright position. Postural control: Posterior lean                                  Cognition Arousal/Alertness: Lethargic Behavior During Therapy: Flat affect Overall Cognitive Status: No family/caregiver present to determine baseline cognitive functioning                     Current Attention Level: Focused Memory: Decreased recall of precautions;Decreased short-term memory Following Commands: Follows one step commands inconsistently;Follows one step commands with increased time Safety/Judgement: Decreased awareness of safety;Decreased awareness of deficits Awareness: Intellectual Problem Solving: Slow processing        Exercises Amputee Exercises Hip Extension: AAROM;Right;Both;10 reps Hip ABduction/ADduction: AAROM;Both;10 reps;Supine Hip Flexion/Marching: AAROM;Both;10 reps;AROM;Supine Knee  Flexion: AAROM;5 reps;Right    General Comments        Pertinent Vitals/Pain Pain Assessment: Faces Faces Pain Scale: Hurts whole lot Pain Location: back>groin Pain Descriptors / Indicators: Grimacing;Guarding;Discomfort Pain Intervention(s): Monitored during session;Limited activity within patient's tolerance    Home Living                      Prior Function            PT Goals (current goals can now be found in  the care plan section) Acute Rehab PT Goals Patient Stated Goal: go home when ready PT Goal Formulation: With patient Time For Goal Achievement: 07/12/21 Potential to Achieve Goals: Fair Progress towards PT goals: Progressing toward goals    Frequency    Min 3X/week      PT Plan Current plan remains appropriate    Co-evaluation              AM-PAC PT "6 Clicks" Mobility   Outcome Measure  Help needed turning from your back to your side while in a flat bed without using bedrails?: A Lot Help needed moving from lying on your back to sitting on the side of a flat bed without using bedrails?: Total Help needed moving to and from a bed to a chair (including a wheelchair)?: Total Help needed standing up from a chair using your arms (e.g., wheelchair or bedside chair)?: Total Help needed to walk in hospital room?: Total Help needed climbing 3-5 steps with a railing? : Total 6 Click Score: 7    End of Session   Activity Tolerance: Patient limited by pain;Patient limited by lethargy Patient left: in bed;with bed alarm set;with call bell/phone within reach Nurse Communication: Mobility status PT Visit Diagnosis: Other abnormalities of gait and mobility (R26.89);Pain Pain - Right/Left: Right Pain - part of body: Hip;Leg     Time: 1449-1510 PT Time Calculation (min) (ACUTE ONLY): 21 min  Charges:  $Therapeutic Activity: 8-22 mins                     07/03/2021  Jacinto Halim., PT Acute Rehabilitation Services [phone]  (pager) [phone]  (office)   Todd Duran 07/03/2021, 6:24 PM

## 2021-07-03 NOTE — Progress Notes (Signed)
Inpatient Rehabilitation Admissions Coordinator   I met with patient at bedside. I will follow up on Monday with his progress with PT and OT to assess further rehab needs for readmit to CIR. [facility] approval has not yet been initiated for limited participation with therapy postoperatively this week  Danne Baxter, RN, MSN Rehab Admissions Coordinator [phone] 07/03/2021 1:04 PM

## 2021-07-03 NOTE — Progress Notes (Addendum)
Called this evening with acute bleed from right groin with VAC output of over 500 mL.  I posted him urgent for the OR for right groin exploration and repair of his common femoral.  He has had multiple surgeries for right groin bleed.  This is becoming a limb threatening situation for him as well as life threatening.  We will make sure he is typed and crossed.  Cephus Shelling, MD [facility] [place] Office: [phone]   Cephus Shelling

## 2021-07-03 NOTE — Progress Notes (Signed)
Wound vac alarm beeping that cannister was full- cannister was indeed full of bright red sanguinous drainage along with a large amount of blood under pt. Upper portion of R groin incision was oozing, removed wound vac dressing and held pressure for approx 10 minutes until oozing stopped. Large hematoma formed under where pressure was held.  Notified vascular on-call Dr Chestine Spore, came up to see pt. Plan is for surgery. Held pm meds. CHG bath done/linens changed. Consent signed. MD spoke w/ wife to update her on plan of care.

## 2021-07-03 NOTE — Progress Notes (Signed)
  Progress Note    07/03/2021 7:37 AM 3 Days Post-Op  Subjective:  mental status much improved this morning   Vitals:   07/02/21 2337 07/03/21 0346  BP: 113/66 115/67  Pulse: 92 94  Resp: 20 16  Temp: 98.7 F (37.1 C) 98.5 F (36.9 C)  SpO2: 97% 96%   Physical Exam: Lungs:  non labored Incisions:  R groin incision without hematoma; BLE stumps healing well Abdomen:  soft, NT, ND Neurologic: A&O  CBC    Component Value Date/Time   WBC 14.7 (H) 07/03/2021 0249   RBC 2.64 (L) 07/03/2021 0249   HGB 7.4 (L) 07/03/2021 0249   HCT 22.7 (L) 07/03/2021 0249   PLT 304 07/03/2021 0249   MCV 86.0 07/03/2021 0249   MCH 28.0 07/03/2021 0249   MCHC 32.6 07/03/2021 0249   RDW 16.0 (H) 07/03/2021 0249   LYMPHSABS 1.9 06/24/2021 0518   MONOABS 0.6 06/24/2021 0518   EOSABS 0.2 06/24/2021 0518   BASOSABS 0.1 06/24/2021 0518    BMET    Component Value Date/Time   NA 135 07/03/2021 0249   K 3.1 (L) 07/03/2021 0249   CL 106 07/03/2021 0249   CO2 22 07/03/2021 0249   GLUCOSE 131 (H) 07/03/2021 0249   BUN 14 07/03/2021 0249   CREATININE 0.54 (L) 07/03/2021 0249   CALCIUM 8.1 (L) 07/03/2021 0249   GFRNONAA >60 07/03/2021 0249    INR    Component Value Date/Time   INR 1.2 06/30/2021 1608     Intake/Output Summary (Last 24 hours) at 07/03/2021 0737 Last data filed at 07/03/2021 8242 Gross per 24 hour  Intake 2816 ml  Output 575 ml  Net 2241 ml     Assessment/Plan:  75 y.o. male is s/p multiple interventions right groin 3 Days Post-Op   Mental status much improved this morning compared to last 24 hours WBC down from 24 to 14; continue cefepime Continue PT/OT; hopeful for return to CIR   Emilie Rutter, PA-C [facility] [phone] 07/03/2021 7:37 AM

## 2021-07-03 NOTE — Anesthesia Preprocedure Evaluation (Addendum)
Anesthesia Evaluation  Patient identified by MRN, date of birth, ID band Patient awake    Reviewed: Allergy & Precautions, H&P , NPO status , Patient's Chart, lab work & pertinent test results  Airway Mallampati: II  TM Distance: >3 FB Neck ROM: full    Dental  (+) Dental Advisory Given, Poor Dentition, Missing   Pulmonary Current Smoker,    breath sounds clear to auscultation       Cardiovascular hypertension, Pt. on medications + Peripheral Vascular Disease   Rhythm:regular Rate:Normal     Neuro/Psych    GI/Hepatic   Endo/Other  diabetes, Type 2  Renal/GU      Musculoskeletal   Abdominal   Peds  Hematology  (+) anemia ,   Anesthesia Other Findings   Reproductive/Obstetrics                            Anesthesia Physical  Anesthesia Plan  ASA: 3 and emergent  Anesthesia Plan: General   Post-op Pain Management:    Induction: Intravenous  PONV Risk Score and Plan: 1 and Ondansetron, Dexamethasone and Treatment may vary due to age or medical condition  Airway Management Planned: Oral ETT  Additional Equipment: Arterial line  Intra-op Plan:   Post-operative Plan: Extubation in OR  Informed Consent: I have reviewed the patients History and Physical, chart, labs and discussed the procedure including the risks, benefits and alternatives for the proposed anesthesia with the patient or authorized representative who has indicated his/her understanding and acceptance.     Dental advisory given  Plan Discussed with: CRNA  Anesthesia Plan Comments:       Anesthesia Quick Evaluation

## 2021-07-04 ENCOUNTER — Inpatient Hospital Stay (HOSPITAL_COMMUNITY): Payer: No Typology Code available for payment source | Admitting: Anesthesiology

## 2021-07-04 ENCOUNTER — Inpatient Hospital Stay (HOSPITAL_COMMUNITY): Payer: No Typology Code available for payment source

## 2021-07-04 ENCOUNTER — Encounter (HOSPITAL_COMMUNITY): Admission: EM | Disposition: A | Discharge status: Hospice | Payer: Self-pay | Attending: Surgery

## 2021-07-04 ENCOUNTER — Inpatient Hospital Stay: Payer: Self-pay

## 2021-07-04 DIAGNOSIS — J9602 Acute respiratory failure with hypercapnia: Secondary | ICD-10-CM

## 2021-07-04 DIAGNOSIS — I739 Peripheral vascular disease, unspecified: Secondary | ICD-10-CM

## 2021-07-04 DIAGNOSIS — Z5189 Encounter for other specified aftercare: Secondary | ICD-10-CM

## 2021-07-04 DIAGNOSIS — D62 Acute posthemorrhagic anemia: Secondary | ICD-10-CM

## 2021-07-04 DIAGNOSIS — T8149XA Infection following a procedure, other surgical site, initial encounter: Secondary | ICD-10-CM | POA: Diagnosis not present

## 2021-07-04 DIAGNOSIS — A498 Other bacterial infections of unspecified site: Secondary | ICD-10-CM | POA: Diagnosis not present

## 2021-07-04 DIAGNOSIS — I97618 Postprocedural hemorrhage and hematoma of a circulatory system organ or structure following other circulatory system procedure: Secondary | ICD-10-CM

## 2021-07-04 HISTORY — PX: FEMORAL ARTERY EXPLORATION: SHX5160

## 2021-07-04 LAB — BPAM FFP
Blood Product Expiration Date: 202208312359
Blood Product Expiration Date: 202208312359
ISSUE DATE / TIME: 202208270329
ISSUE DATE / TIME: 202208270329
Unit Type and Rh: 600
Unit Type and Rh: 6200

## 2021-07-04 LAB — POCT I-STAT 7, (LYTES, BLD GAS, ICA,H+H)
Acid-base deficit: 5 mmol/L — ABNORMAL HIGH (ref 0.0–2.0)
Acid-base deficit: 1 mmol/L (ref 0.0–2.0)
Acid-base deficit: 5 mmol/L — ABNORMAL HIGH (ref 0.0–2.0)
Acid-base deficit: 9 mmol/L — ABNORMAL HIGH (ref 0.0–2.0)
Acid-base deficit: 4 mmol/L — ABNORMAL HIGH (ref 0.0–2.0)
Bicarbonate: 21.1 mmol/L (ref 20.0–28.0)
Bicarbonate: 19.2 mmol/L — ABNORMAL LOW (ref 20.0–28.0)
Bicarbonate: 22.9 mmol/L (ref 20.0–28.0)
Bicarbonate: 24.3 mmol/L (ref 20.0–28.0)
Bicarbonate: 20.8 mmol/L (ref 20.0–28.0)
Calcium, Ion: 1.06 mmol/L — ABNORMAL LOW (ref 1.15–1.40)
Calcium, Ion: 1.03 mmol/L — ABNORMAL LOW (ref 1.15–1.40)
Calcium, Ion: 1.08 mmol/L — ABNORMAL LOW (ref 1.15–1.40)
Calcium, Ion: 1.16 mmol/L (ref 1.15–1.40)
Calcium, Ion: 0.98 mmol/L — ABNORMAL LOW (ref 1.15–1.40)
HCT: 23 % — ABNORMAL LOW (ref 39.0–52.0)
HCT: 22 % — ABNORMAL LOW (ref 39.0–52.0)
HCT: 24 % — ABNORMAL LOW (ref 39.0–52.0)
HCT: 30 % — ABNORMAL LOW (ref 39.0–52.0)
HCT: 38 % — ABNORMAL LOW (ref 39.0–52.0)
Hemoglobin: 7.8 g/dL — ABNORMAL LOW (ref 13.0–17.0)
Hemoglobin: 10.2 g/dL — ABNORMAL LOW (ref 13.0–17.0)
Hemoglobin: 7.5 g/dL — ABNORMAL LOW (ref 13.0–17.0)
Hemoglobin: 8.2 g/dL — ABNORMAL LOW (ref 13.0–17.0)
Hemoglobin: 12.9 g/dL — ABNORMAL LOW (ref 13.0–17.0)
O2 Saturation: 100 %
O2 Saturation: 100 %
O2 Saturation: 99 %
O2 Saturation: 99 %
O2 Saturation: 90 %
Patient temperature: 36.6
Patient temperature: 36.9
Patient temperature: 37.1
Patient temperature: 98.4
Patient temperature: 36.6
Potassium: 3.9 mmol/L (ref 3.5–5.1)
Potassium: 3.6 mmol/L (ref 3.5–5.1)
Potassium: 3.8 mmol/L (ref 3.5–5.1)
Potassium: 4 mmol/L (ref 3.5–5.1)
Potassium: 4.9 mmol/L (ref 3.5–5.1)
Sodium: 138 mmol/L (ref 135–145)
Sodium: 139 mmol/L (ref 135–145)
Sodium: 139 mmol/L (ref 135–145)
Sodium: 141 mmol/L (ref 135–145)
Sodium: 138 mmol/L (ref 135–145)
TCO2: 22 mmol/L (ref 22–32)
TCO2: 21 mmol/L — ABNORMAL LOW (ref 22–32)
TCO2: 24 mmol/L (ref 22–32)
TCO2: 26 mmol/L (ref 22–32)
TCO2: 22 mmol/L (ref 22–32)
pCO2 arterial: 40.5 mmHg (ref 32.0–48.0)
pCO2 arterial: 43 mmHg (ref 32.0–48.0)
pCO2 arterial: 49.7 mmHg — ABNORMAL HIGH (ref 32.0–48.0)
pCO2 arterial: 52.4 mmHg — ABNORMAL HIGH (ref 32.0–48.0)
pCO2 arterial: 36.8 mmHg (ref 32.0–48.0)
pH, Arterial: 7.322 — ABNORMAL LOW (ref 7.350–7.450)
pH, Arterial: 7.195 — CL (ref 7.350–7.450)
pH, Arterial: 7.248 — ABNORMAL LOW (ref 7.350–7.450)
pH, Arterial: 7.359 (ref 7.350–7.450)
pH, Arterial: 7.359 (ref 7.350–7.450)
pO2, Arterial: 239 mmHg — ABNORMAL HIGH (ref 83.0–108.0)
pO2, Arterial: 160 mmHg — ABNORMAL HIGH (ref 83.0–108.0)
pO2, Arterial: 195 mmHg — ABNORMAL HIGH (ref 83.0–108.0)
pO2, Arterial: 202 mmHg — ABNORMAL HIGH (ref 83.0–108.0)
pO2, Arterial: 59 mmHg — ABNORMAL LOW (ref 83.0–108.0)

## 2021-07-04 LAB — MRSA NEXT GEN BY PCR, NASAL: MRSA by PCR Next Gen: NOT DETECTED

## 2021-07-04 LAB — GLUCOSE, CAPILLARY
Glucose-Capillary: 108 mg/dL — ABNORMAL HIGH (ref 70–99)
Glucose-Capillary: 132 mg/dL — ABNORMAL HIGH (ref 70–99)
Glucose-Capillary: 158 mg/dL — ABNORMAL HIGH (ref 70–99)
Glucose-Capillary: 205 mg/dL — ABNORMAL HIGH (ref 70–99)
Glucose-Capillary: 218 mg/dL — ABNORMAL HIGH (ref 70–99)
Glucose-Capillary: 70 mg/dL (ref 70–99)
Glucose-Capillary: 72 mg/dL (ref 70–99)

## 2021-07-04 LAB — COMPREHENSIVE METABOLIC PANEL
ALT: 15 U/L (ref 0–44)
AST: 19 U/L (ref 15–41)
Albumin: 1.7 g/dL — ABNORMAL LOW (ref 3.5–5.0)
Alkaline Phosphatase: 122 U/L (ref 38–126)
Anion gap: 6 (ref 5–15)
BUN: 14 mg/dL (ref 8–23)
CO2: 22 mmol/L (ref 22–32)
Calcium: 7.6 mg/dL — ABNORMAL LOW (ref 8.9–10.3)
Chloride: 109 mmol/L (ref 98–111)
Creatinine, Ser: 0.59 mg/dL — ABNORMAL LOW (ref 0.61–1.24)
GFR, Estimated: >60 mL/min (ref >60)
Glucose, Bld: 242 mg/dL — ABNORMAL HIGH (ref 70–99)
Potassium: 4.1 mmol/L (ref 3.5–5.1)
Sodium: 137 mmol/L (ref 135–145)
Total Bilirubin: 1.9 mg/dL — ABNORMAL HIGH (ref 0.3–1.2)
Total Protein: 5.2 g/dL — ABNORMAL LOW (ref 6.5–8.1)

## 2021-07-04 LAB — HEMOGLOBIN AND HEMATOCRIT, BLOOD
HCT: 28.2 % — ABNORMAL LOW (ref 39.0–52.0)
HCT: 28.5 % — ABNORMAL LOW (ref 39.0–52.0)
Hemoglobin: 9.5 g/dL — ABNORMAL LOW (ref 13.0–17.0)
Hemoglobin: 9.7 g/dL — ABNORMAL LOW (ref 13.0–17.0)

## 2021-07-04 LAB — PREPARE FRESH FROZEN PLASMA
Unit division: 0
Unit division: 0

## 2021-07-04 LAB — CBC
HCT: 33.2 % — ABNORMAL LOW (ref 39.0–52.0)
Hemoglobin: 11.3 g/dL — ABNORMAL LOW (ref 13.0–17.0)
MCH: 29.3 pg (ref 26.0–34.0)
MCHC: 34 g/dL (ref 30.0–36.0)
MCV: 86 fL (ref 80.0–100.0)
Platelets: 298 10*3/uL (ref 150–400)
RBC: 3.86 MIL/uL — ABNORMAL LOW (ref 4.22–5.81)
RDW: 15.5 % (ref 11.5–15.5)
WBC: 31.9 10*3/uL — ABNORMAL HIGH (ref 4.0–10.5)
nRBC: 0 % (ref 0.0–0.2)

## 2021-07-04 LAB — MAGNESIUM
Magnesium: 1.5 mg/dL — ABNORMAL LOW (ref 1.7–2.4)
Magnesium: 2 mg/dL (ref 1.7–2.4)

## 2021-07-04 LAB — BASIC METABOLIC PANEL
Anion gap: 8 (ref 5–15)
BUN: 16 mg/dL (ref 8–23)
CO2: 19 mmol/L — ABNORMAL LOW (ref 22–32)
Calcium: 7.2 mg/dL — ABNORMAL LOW (ref 8.9–10.3)
Chloride: 111 mmol/L (ref 98–111)
Creatinine, Ser: 0.61 mg/dL (ref 0.61–1.24)
GFR, Estimated: >60 mL/min (ref >60)
Glucose, Bld: 226 mg/dL — ABNORMAL HIGH (ref 70–99)
Potassium: 3.8 mmol/L (ref 3.5–5.1)
Sodium: 138 mmol/L (ref 135–145)

## 2021-07-04 LAB — PREPARE RBC (CROSSMATCH)

## 2021-07-04 LAB — PHOSPHORUS
Phosphorus: 2.8 mg/dL (ref 2.5–4.6)
Phosphorus: 2.4 mg/dL — ABNORMAL LOW (ref 2.5–4.6)

## 2021-07-04 SURGERY — EXPLORATION, ARTERY, FEMORAL
Anesthesia: General | Site: Groin | Laterality: Right

## 2021-07-04 MED ORDER — VITAL 1.5 CAL PO LIQD
1000.0000 mL | ORAL | Status: DC
  Administered 2021-07-04: 1000 mL

## 2021-07-04 MED ORDER — LACTATED RINGERS IV BOLUS
500.0000 mL | Freq: Once | INTRAVENOUS | Status: AC
  Administered 2021-07-04: 500 mL via INTRAVENOUS

## 2021-07-04 MED ORDER — CHLORHEXIDINE GLUCONATE 0.12% ORAL RINSE (MEDLINE KIT)
15.0000 mL | Freq: Two times a day (BID) | OROMUCOSAL | Status: DC
  Administered 2021-07-04 (×2): 15 mL via OROMUCOSAL

## 2021-07-04 MED ORDER — CHLORHEXIDINE GLUCONATE CLOTH 2 % EX PADS
6.0000 | MEDICATED_PAD | Freq: Every day | CUTANEOUS | Status: DC
  Administered 2021-07-05 – 2021-07-25 (×22): 6 via TOPICAL

## 2021-07-04 MED ORDER — FENTANYL CITRATE (PF) 100 MCG/2ML IJ SOLN: 25.0000 ug | Freq: Once | INTRAMUSCULAR | Status: DC

## 2021-07-04 MED ORDER — DEXMEDETOMIDINE HCL IN NACL 400 MCG/100ML IV SOLN: 0.2000 ug/kg/h | INTRAVENOUS | Status: DC

## 2021-07-04 MED ORDER — DOCUSATE SODIUM 50 MG/5ML PO LIQD: 100.0000 mg | Freq: Two times a day (BID) | ORAL | Status: DC

## 2021-07-04 MED ORDER — TRAMADOL HCL 50 MG PO TABS: 50.0000 mg | ORAL_TABLET | Freq: Four times a day (QID) | ORAL | Status: DC | PRN

## 2021-07-04 MED ORDER — VITAL 1.5 CAL PO LIQD: 1000.0000 mL | ORAL | Status: DC

## 2021-07-04 MED ORDER — FENTANYL CITRATE (PF) 250 MCG/5ML IJ SOLN
INTRAMUSCULAR | Status: AC
  Filled 2021-07-04: qty 5

## 2021-07-04 MED ORDER — FENTANYL BOLUS VIA INFUSION
25.0000 ug | INTRAVENOUS | Status: DC | PRN
  Administered 2021-07-04: 25 ug via INTRAVENOUS
  Administered 2021-07-04: 100 ug via INTRAVENOUS
  Administered 2021-07-04: 25 ug via INTRAVENOUS
  Administered 2021-07-04 – 2021-07-05 (×5): 50 ug via INTRAVENOUS
  Filled 2021-07-04: qty 100

## 2021-07-04 MED ORDER — ESMOLOL HCL 100 MG/10ML IV SOLN
INTRAVENOUS | Status: DC | PRN
  Administered 2021-07-04 (×3): 20 mg via INTRAVENOUS

## 2021-07-04 MED ORDER — MAGNESIUM SULFATE 2 GM/50ML IV SOLN
2.0000 g | Freq: Once | INTRAVENOUS | Status: AC
  Administered 2021-07-04: 2 g via INTRAVENOUS
  Filled 2021-07-04: qty 50

## 2021-07-04 MED ORDER — PROPOFOL 10 MG/ML IV BOLUS
INTRAVENOUS | Status: AC
  Filled 2021-07-04: qty 20

## 2021-07-04 MED ORDER — SUGAMMADEX SODIUM 200 MG/2ML IV SOLN
INTRAVENOUS | Status: DC | PRN
  Administered 2021-07-04: 200 mg via INTRAVENOUS

## 2021-07-04 MED ORDER — HYDROMORPHONE HCL 1 MG/ML IJ SOLN
INTRAMUSCULAR | Status: AC
  Filled 2021-07-04: qty 1

## 2021-07-04 MED ORDER — NOREPINEPHRINE 4 MG/250ML-% IV SOLN
INTRAVENOUS | Status: AC
  Filled 2021-07-04: qty 250

## 2021-07-04 MED ORDER — METHOCARBAMOL 500 MG PO TABS
750.0000 mg | ORAL_TABLET | Freq: Four times a day (QID) | ORAL | Status: DC
  Administered 2021-07-04 – 2021-07-05 (×4): 750 mg
  Filled 2021-07-04 (×5): qty 2

## 2021-07-04 MED ORDER — ACETAMINOPHEN 160 MG/5ML PO SOLN
325.0000 mg | ORAL | Status: DC | PRN
  Administered 2021-07-04 – 2021-07-05 (×2): 650 mg
  Filled 2021-07-04 (×2): qty 20.3

## 2021-07-04 MED ORDER — VITAL HIGH PROTEIN PO LIQD: 1000.0000 mL | ORAL | Status: DC

## 2021-07-04 MED ORDER — POLYETHYLENE GLYCOL 3350 17 G PO PACK: 17.0000 g | PACK | Freq: Every day | ORAL | Status: DC

## 2021-07-04 MED ORDER — LACTATED RINGERS IV SOLN
INTRAVENOUS | Status: DC
  Administered 2021-07-06: 1 mL via INTRAVENOUS

## 2021-07-04 MED ORDER — ATORVASTATIN CALCIUM 40 MG PO TABS
40.0000 mg | ORAL_TABLET | Freq: Every day | ORAL | Status: DC
  Administered 2021-07-04: 40 mg
  Filled 2021-07-04: qty 1

## 2021-07-04 MED ORDER — FENTANYL CITRATE (PF) 250 MCG/5ML IJ SOLN
INTRAMUSCULAR | Status: DC | PRN
  Administered 2021-07-04: 100 ug via INTRAVENOUS
  Administered 2021-07-04: 50 ug via INTRAVENOUS

## 2021-07-04 MED ORDER — ROCURONIUM BROMIDE 100 MG/10ML IV SOLN
INTRAVENOUS | Status: DC | PRN
  Administered 2021-07-04: 100 mg via INTRAVENOUS

## 2021-07-04 MED ORDER — PANTOPRAZOLE SODIUM 40 MG PO PACK
40.0000 mg | PACK | Freq: Every day | ORAL | Status: DC
  Administered 2021-07-05: 40 mg
  Filled 2021-07-04: qty 20

## 2021-07-04 MED ORDER — HEPARIN 6000 UNIT IRRIGATION SOLUTION
Status: DC | PRN
  Administered 2021-07-04: 1

## 2021-07-04 MED ORDER — SODIUM CHLORIDE 0.9 % IV SOLN: INTRAVENOUS | Status: DC | PRN

## 2021-07-04 MED ORDER — GABAPENTIN 250 MG/5ML PO SOLN
100.0000 mg | Freq: Three times a day (TID) | ORAL | Status: DC
  Administered 2021-07-04 – 2021-07-05 (×4): 100 mg
  Filled 2021-07-04 (×5): qty 2

## 2021-07-04 MED ORDER — SODIUM CHLORIDE 0.9% IV SOLUTION: Freq: Once | INTRAVENOUS | Status: DC

## 2021-07-04 MED ORDER — SUCCINYLCHOLINE CHLORIDE 200 MG/10ML IV SOSY
PREFILLED_SYRINGE | INTRAVENOUS | Status: DC | PRN
  Administered 2021-07-04: 100 mg via INTRAVENOUS

## 2021-07-04 MED ORDER — SODIUM BICARBONATE 8.4 % IV SOLN
INTRAVENOUS | Status: DC | PRN
  Administered 2021-07-04: 50 mL via INTRAVENOUS

## 2021-07-04 MED ORDER — ONDANSETRON HCL 4 MG/2ML IJ SOLN
INTRAMUSCULAR | Status: DC | PRN
  Administered 2021-07-04: 4 mg via INTRAVENOUS

## 2021-07-04 MED ORDER — ALUM & MAG HYDROXIDE-SIMETH 200-200-20 MG/5ML PO SUSP: 15.0000 mL | ORAL | Status: DC | PRN

## 2021-07-04 MED ORDER — LACTATED RINGERS IV SOLN: INTRAVENOUS | Status: DC | PRN

## 2021-07-04 MED ORDER — PROPOFOL 1000 MG/100ML IV EMUL
0.0000 ug/kg/min | INTRAVENOUS | Status: DC
  Administered 2021-07-04: 25 ug/kg/min via INTRAVENOUS
  Administered 2021-07-04: 15 ug/kg/min via INTRAVENOUS
  Administered 2021-07-05: 10 ug/kg/min via INTRAVENOUS
  Filled 2021-07-04 (×4): qty 100

## 2021-07-04 MED ORDER — SENNOSIDES 8.8 MG/5ML PO SYRP: 5.0000 mL | ORAL_SOLUTION | Freq: Every evening | ORAL | Status: DC | PRN

## 2021-07-04 MED ORDER — ORAL CARE MOUTH RINSE: 15.0000 mL | OROMUCOSAL | Status: DC

## 2021-07-04 MED ORDER — FENTANYL 2500MCG IN NS 250ML (10MCG/ML) PREMIX INFUSION
25.0000 ug/h | INTRAVENOUS | Status: DC
  Administered 2021-07-04: 175 ug/h via INTRAVENOUS
  Administered 2021-07-04: 25 ug/h via INTRAVENOUS
  Administered 2021-07-05: 160 ug/h via INTRAVENOUS
  Filled 2021-07-04 (×3): qty 250

## 2021-07-04 MED ORDER — 0.9 % SODIUM CHLORIDE (POUR BTL) OPTIME
TOPICAL | Status: DC | PRN
  Administered 2021-07-04: 2000 mL

## 2021-07-04 MED ORDER — INSULIN ASPART 100 UNIT/ML IJ SOLN
0.0000 [IU] | INTRAMUSCULAR | Status: DC
  Administered 2021-07-05 (×2): 3 [IU] via SUBCUTANEOUS
  Administered 2021-07-05: 2 [IU] via SUBCUTANEOUS
  Administered 2021-07-05 – 2021-07-06 (×2): 3 [IU] via SUBCUTANEOUS
  Administered 2021-07-06 (×2): 2 [IU] via SUBCUTANEOUS
  Administered 2021-07-06: 3 [IU] via SUBCUTANEOUS

## 2021-07-04 MED ORDER — PROPOFOL 10 MG/ML IV BOLUS
INTRAVENOUS | Status: DC | PRN
  Administered 2021-07-04: 70 mg via INTRAVENOUS

## 2021-07-04 MED ORDER — DOCUSATE SODIUM 50 MG/5ML PO LIQD
100.0000 mg | Freq: Two times a day (BID) | ORAL | Status: DC
  Administered 2021-07-04: 100 mg
  Filled 2021-07-04 (×2): qty 10

## 2021-07-04 MED ORDER — ASPIRIN 81 MG PO CHEW
81.0000 mg | CHEWABLE_TABLET | Freq: Every day | ORAL | Status: DC
  Administered 2021-07-05: 81 mg
  Filled 2021-07-04: qty 1

## 2021-07-04 MED ORDER — PHENYLEPHRINE HCL-NACL 20-0.9 MG/250ML-% IV SOLN
INTRAVENOUS | Status: AC
  Filled 2021-07-04: qty 250

## 2021-07-04 MED ORDER — PHENYLEPHRINE HCL (PRESSORS) 10 MG/ML IV SOLN
INTRAVENOUS | Status: DC | PRN
  Administered 2021-07-04: 80 ug via INTRAVENOUS
  Administered 2021-07-04 (×2): 40 ug via INTRAVENOUS

## 2021-07-04 MED ORDER — CLOPIDOGREL BISULFATE 75 MG PO TABS
75.0000 mg | ORAL_TABLET | Freq: Every day | ORAL | Status: DC
  Administered 2021-07-05: 75 mg
  Filled 2021-07-04 (×2): qty 1

## 2021-07-04 MED ORDER — ADULT MULTIVITAMIN LIQUID CH
15.0000 mL | Freq: Every day | ORAL | Status: DC
  Administered 2021-07-05: 15 mL
  Filled 2021-07-04: qty 15

## 2021-07-04 MED ORDER — PROSOURCE TF PO LIQD
45.0000 mL | Freq: Three times a day (TID) | ORAL | Status: DC
  Administered 2021-07-04 – 2021-07-05 (×3): 45 mL
  Filled 2021-07-04 (×3): qty 45

## 2021-07-04 MED ORDER — HEPARIN 6000 UNIT IRRIGATION SOLUTION
Status: AC
  Filled 2021-07-04: qty 500

## 2021-07-04 SURGICAL SUPPLY — 69 items
BAG COUNTER SPONGE SURGICOUNT (BAG) ×2 IMPLANT
BAG SPNG CNTER NS LX DISP (BAG) ×1
BANDAGE ESMARK 6X9 LF (GAUZE/BANDAGES/DRESSINGS) IMPLANT
BNDG CMPR 9X6 STRL LF SNTH (GAUZE/BANDAGES/DRESSINGS)
BNDG ESMARK 6X9 LF (GAUZE/BANDAGES/DRESSINGS)
CANISTER SUCT 3000ML PPV (MISCELLANEOUS) ×2 IMPLANT
CLIP TI MEDIUM 24 (CLIP) ×1 IMPLANT
CLIP TI WIDE RED SMALL 24 (CLIP) ×1 IMPLANT
CLIP VESOCCLUDE MED 24/CT (CLIP) ×2 IMPLANT
CLIP VESOCCLUDE SM WIDE 24/CT (CLIP) ×2 IMPLANT
COVER PROBE W GEL 5X96 (DRAPES) IMPLANT
CUFF TOURN SGL QUICK 24 (TOURNIQUET CUFF)
CUFF TOURN SGL QUICK 34 (TOURNIQUET CUFF)
CUFF TOURN SGL QUICK 42 (TOURNIQUET CUFF) IMPLANT
CUFF TRNQT CYL 24X4X16.5-23 (TOURNIQUET CUFF) IMPLANT
CUFF TRNQT CYL 34X4.125X (TOURNIQUET CUFF) IMPLANT
DRAIN CHANNEL 15F RND FF W/TCR (WOUND CARE) ×1 IMPLANT
DRAPE C-ARM 42X72 X-RAY (DRAPES) ×2 IMPLANT
DRSG COVADERM 4X6 (GAUZE/BANDAGES/DRESSINGS) ×1 IMPLANT
DRSG COVADERM 4X8 (GAUZE/BANDAGES/DRESSINGS) ×1 IMPLANT
ELECT REM PT RETURN 9FT ADLT (ELECTROSURGICAL) ×2
ELECTRODE REM PT RTRN 9FT ADLT (ELECTROSURGICAL) ×1 IMPLANT
EVACUATOR SILICONE 100CC (DRAIN) ×1 IMPLANT
GAUZE SPONGE 4X4 12PLY STRL (GAUZE/BANDAGES/DRESSINGS) ×1 IMPLANT
GLOVE SRG 8 PF TXTR STRL LF DI (GLOVE) ×1 IMPLANT
GLOVE SURG ENC MOIS LTX SZ7.5 (GLOVE) ×2 IMPLANT
GLOVE SURG UNDER POLY LF SZ6.5 (GLOVE) ×1 IMPLANT
GLOVE SURG UNDER POLY LF SZ7 (GLOVE) ×1 IMPLANT
GLOVE SURG UNDER POLY LF SZ7.5 (GLOVE) ×2 IMPLANT
GLOVE SURG UNDER POLY LF SZ8 (GLOVE) ×2
GOWN STRL NON-REIN LRG LVL3 (GOWN DISPOSABLE) ×1 IMPLANT
GOWN STRL REIN XL XLG (GOWN DISPOSABLE) ×1 IMPLANT
GOWN STRL REUS W/ TWL LRG LVL3 (GOWN DISPOSABLE) ×2 IMPLANT
GOWN STRL REUS W/ TWL XL LVL3 (GOWN DISPOSABLE) ×2 IMPLANT
GOWN STRL REUS W/TWL LRG LVL3 (GOWN DISPOSABLE) ×4
GOWN STRL REUS W/TWL XL LVL3 (GOWN DISPOSABLE) ×4
INSERT FOGARTY SM (MISCELLANEOUS) IMPLANT
KIT BASIN OR (CUSTOM PROCEDURE TRAY) ×2 IMPLANT
KIT TURNOVER KIT B (KITS) ×2 IMPLANT
NS IRRIG 1000ML POUR BTL (IV SOLUTION) ×4 IMPLANT
PACK PERIPHERAL VASCULAR (CUSTOM PROCEDURE TRAY) ×2 IMPLANT
PAD ARMBOARD 7.5X6 YLW CONV (MISCELLANEOUS) ×4 IMPLANT
PENCIL BUTTON HOLSTER BLD 10FT (ELECTRODE) ×1 IMPLANT
SPONGE INTESTINAL PEANUT (DISPOSABLE) ×1 IMPLANT
STAPLER VISISTAT 35W (STAPLE) ×1 IMPLANT
SUT ETHILON 3 0 FSL (SUTURE) ×1 IMPLANT
SUT ETHILON 3 0 PS 1 (SUTURE) IMPLANT
SUT MNCRL AB 4-0 PS2 18 (SUTURE) ×6 IMPLANT
SUT PDS AB 1 TP1 54 (SUTURE) ×2 IMPLANT
SUT PROLENE 4 0 RB 1 (SUTURE) ×10
SUT PROLENE 4-0 RB1 .5 CRCL 36 (SUTURE) IMPLANT
SUT PROLENE 4-0 RB1 18X2 ARM (SUTURE) IMPLANT
SUT PROLENE 5 0 C 1 24 (SUTURE) ×9 IMPLANT
SUT PROLENE 5 0 C 1 36 (SUTURE) ×2 IMPLANT
SUT PROLENE 6 0 BV (SUTURE) ×4 IMPLANT
SUT PROLENE 7 0 BV 1 (SUTURE) IMPLANT
SUT SILK 2 0 PERMA HAND 18 BK (SUTURE) ×2 IMPLANT
SUT SILK 3 0 (SUTURE)
SUT SILK 3-0 18XBRD TIE 12 (SUTURE) IMPLANT
SUT VIC AB 2-0 CT1 27 (SUTURE) ×8
SUT VIC AB 2-0 CT1 36 (SUTURE) ×1 IMPLANT
SUT VIC AB 2-0 CT1 TAPERPNT 27 (SUTURE) ×2 IMPLANT
SUT VIC AB 3-0 SH 27 (SUTURE) ×6
SUT VIC AB 3-0 SH 27X BRD (SUTURE) ×3 IMPLANT
TOWEL GREEN STERILE (TOWEL DISPOSABLE) ×2 IMPLANT
TRAY FOLEY MTR SLVR 16FR STAT (SET/KITS/TRAYS/PACK) ×2 IMPLANT
TUBING EXTENTION W/L.L. (IV SETS) ×2 IMPLANT
UNDERPAD 30X36 HEAVY ABSORB (UNDERPADS AND DIAPERS) ×2 IMPLANT
WATER STERILE IRR 1000ML POUR (IV SOLUTION) ×2 IMPLANT

## 2021-07-04 NOTE — Progress Notes (Signed)
[facility]  Subjective  -remains intubated and on propofol   Objective (!) 138/92 (!) 129 97.8 F (36.6 C) (Oral) (!) 22 100%  Intake/Output Summary (Last 24 hours) at 07/04/2021 0856 Last data filed at 07/04/2021 0700 Gross per 24 hour  Intake 8193.14 ml  Output 1290 ml  Net 6903.14 ml    Right groin and transplant incision clean dry and intact.  Drain with bloody output but only about 15 mL recorded since second operation.  Laboratory Lab Results: Recent Labs    07/03/21 0249 07/04/21 0032 07/04/21 0554 07/04/21 0629  WBC 14.7*  --   --  31.9*  HGB 7.4*   < > 10.2* 11.3*  HCT 22.7*   < > 30.0* 33.2*  PLT 304  --   --  298   < > = values in this interval not displayed.   BMET Recent Labs    07/03/21 0249 07/04/21 0032 07/04/21 0554 07/04/21 0629  NA 135   < > 139 137  K 3.1*   < > 4.0 4.1  CL 106  --   --  109  CO2 22  --   --  22  GLUCOSE 131*  --   --  242*  BUN 14  --   --  14  CREATININE 0.54*  --   --  0.59*  CALCIUM 8.1*  --   --  7.6*   < > = values in this interval not displayed.    COAG Lab Results  Component Value Date   INR 1.2 06/30/2021   No results found for: PTT  Assessment/Planning:  75 year old male went to the OR twice last night for continued profuse bleeding from the right groin.  On the second trip I ligated his external iliac artery as well as his right leg bypass and excised the infected patch in the right common femoral artery.  Cultures are growing Pseudomonas.  He is on IV cefepime which is sensitive based on recent cultures.  He remains critically ill and is intubated in the ICU.  Appreciate critical care support.  I have talked with critical care this morning and will work on pain control as well as additional volume to see if this helps his sinus tachycardia.  Likely wean toward extubation tomorrow.  Last hemoglobin 11.3 after extensive transfusion in the OR last night.  Still looks critically  ill this morning.  No evidence of ongoing bleeding from the groin.  Cephus Shelling 07/04/2021 8:56 AM --

## 2021-07-04 NOTE — Progress Notes (Signed)
Spoke with Percell Miller, RN concerning PICC placement. Aware PICC will not be placed until 8/28. Patient has adequate access at this time.

## 2021-07-04 NOTE — Progress Notes (Signed)
Saw pt HR jumped to the 140s around 0230. Went to check on pt and he had increased work of breathing and R groin was noted to be bleeding again with blood pooling under his sheets. Dr Chestine Spore notified. Pressure continuously held until arrival to OR. Belongings gathered and placed at nurses station.

## 2021-07-04 NOTE — Anesthesia Preprocedure Evaluation (Addendum)
Anesthesia Evaluation  Patient identified by MRN, date of birth, ID band Patient awake    Reviewed: Allergy & Precautions, H&P , NPO status , Patient's Chart, lab work & pertinent test results, reviewed documented beta blocker date and time   Airway Mallampati: II  TM Distance: >3 FB Neck ROM: full    Dental  (+) Dental Advisory Given, Poor Dentition, Missing   Pulmonary Current Smoker,    breath sounds clear to auscultation       Cardiovascular hypertension, Pt. on medications and Pt. on home beta blockers + Peripheral Vascular Disease   Rhythm:regular Rate:Normal     Neuro/Psych    GI/Hepatic   Endo/Other  diabetes, Type 2  Renal/GU      Musculoskeletal   Abdominal   Peds  Hematology  (+) anemia ,   Anesthesia Other Findings   Reproductive/Obstetrics                             Anesthesia Physical  Anesthesia Plan  ASA: 3 and emergent  Anesthesia Plan: General   Post-op Pain Management:    Induction: Intravenous  PONV Risk Score and Plan: 1 and Ondansetron, Treatment may vary due to age or medical condition and Dexamethasone  Airway Management Planned: Oral ETT  Additional Equipment: Arterial line  Intra-op Plan:   Post-operative Plan: Extubation in OR  Informed Consent: I have reviewed the patients History and Physical, chart, labs and discussed the procedure including the risks, benefits and alternatives for the proposed anesthesia with the patient or authorized representative who has indicated his/her understanding and acceptance.     Dental advisory given  Plan Discussed with: CRNA  Anesthesia Plan Comments:        Anesthesia Quick Evaluation

## 2021-07-04 NOTE — Consult Note (Addendum)
NAME:  Todd Duran, MRN:  [id number], DOB:  04/02/1946, LOS: 7 ADMISSION DATE:  06/27/2021, CONSULTATION DATE:  8/27 REFERRING MD:  Dr. 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  On 7/13 patient received left BKA.  Patient was brought in on 7/29 for right toe amputation.  Right femoral to popliteal artery bypass grafting was done on 8/3.  On 8/13, patient presents with infected right foot and on 8/14 a right BKA was performed.   On 8/16 patient was sent to CIR.  On 8/20 vascular surgery consulted for bleeding on right groin and hemoglobin dropped to 6.9.  CT showing pseudoaneurysm right femoral artery.  On 8/20 vascular surgery performed surgery to explore pseudoaneurysm and repair of bovine pericardial patch angioplasty.  Wound VAC in place.  On 8/23, vascular surgery performed revision of right femoral retroperitoneal right iliac artery exposure, redo right femoral artery exposure, and endarterectomy and placement of multiple budgeted sutures for hemostasis.  Right femoral Prevena wound VAC postop cultures show Pseudomonas aeruginosa.  Started on cefepime.  On 8/26, acute bleed from right groin with VAC output of greater than 500 mL.  Performed right groin exploration and evacuation of right groin hematoma.  On 8/27, patient brought back to the OR for reexploration of right groin, ligation of right femoropopliteal vein bypass, ligation of right external iliac artery, excision of right bovine pericardial patch with excision of right common femoral artery, and negative pressure drain to right groin.  Patient remains intubated postprocedure due to unable to wean off ventilator.  PCCM consulted for ventilator management.   Pertinent  Medical History   Past Medical History:  Diagnosis Date   Diabetes mellitus without  complication (HCC)    Hypertension      Significant Hospital Events: Including procedures, antibiotic start and stop dates in addition to other pertinent events   8/27: pccm consulted for vent management post procedure  Interim History / Subjective:  Patient intubated on PRVC. Patient heart rate 120s and blood pressure stable. Patient sedated on propofol  Objective   Blood pressure 123/76, pulse 90, temperature 98 F (36.7 C), temperature source Oral, resp. rate 20, SpO2 91 %.        Intake/Output Summary (Last 24 hours) at 07/04/2021 0542 Last data filed at 07/04/2021 0503 Gross per 24 hour  Intake 7702.7 ml  Output 1575 ml  Net 6127.7 ml   There were no vitals filed for this visit.  Examination: General:  critically ill appearing on mech vent HEENT: MM pink/moist; ETT in place Neuro: sedated CV: s1s2, sinus tach, no m/r/g PULM:  dim clear BS bilaterally; intubated on mech vent PRVC GI: soft, bsx4 active  Extremities: warm/dry,  BLE BKA  Skin: no rashes or lesions   CXR: ETT and OG in good position ABG 7.24, 52, 195, 22.9 WBC 14.7 from 24.9 Hgb 7.4  Resolved Hospital Problem list     Assessment & Plan:  Acute respiratory failure with hypoxia s/p procedure P: -Continue mechanical ventilation PRVC 8cc/kg -Will increase respiratory rate due to acidosis on ABG -Repeat ABG in 1 hour -Wean FiO2 for sats greater then 92% -VAP prevention in place -Daily SBT/SAT -On propofol/fentanyl for sedation for RASS goal 0 to -1  reexploration of right groin ligation of right femoropopliteal vein bypass  ligation of right external  iliac artery, excision of right bovine pericardial patch with excision of right common femoral artery negative pressure drain to right groin Pseudomonas aeruginosa Right femoral Prevena wound VAC postop P: -per primary vascular surgery -continue cefepime -PT/OT  ABLA P: -trend CBC -Transfuse for hemoglobin less than 7  T2DM P: -SSI and  CBG monitoring -Continue metformin  Hx of HTN P: -Continue home BP meds  Best Practice (right click and "Reselect all SmartList Selections" daily)   Diet/type: NPO w/ meds via tube DVT prophylaxis: other will hold off initially post procedure GI prophylaxis: PPI Lines: N/A Foley:  Yes, and it is still needed Code Status:  full code Last date of multidisciplinary goals of care discussion [pending]  Labs   CBC: Recent Labs  Lab 06/28/21 1624 06/30/21 0127 06/30/21 1023 06/30/21 1140 07/01/21 0426 07/02/21 0222 07/03/21 0249 07/04/21 0032  WBC 17.9* 22.5*  --   --  23.5* 24.9* 14.7*  --   HGB 8.0* 7.9*   < > 9.2* 8.0* 7.9* 7.4* 7.8*  HCT 24.1* 23.5*   < > 27.0* 23.5* 23.8* 22.7* 23.0*  MCV 83.1 83.6  --   --  84.5 85.6 86.0  --   PLT 365 382  --   --  330 352 304  --    < > = values in this interval not displayed.    Basic Metabolic Panel: Recent Labs  Lab 06/27/21 1849 06/27/21 1942 06/28/21 0113 06/30/21 0127 06/30/21 1023 06/30/21 1140 07/03/21 0249 07/04/21 0032  NA 132* 132* 133* 131* 132* 133* 135 138  K 4.4 4.7 4.3 3.7 3.9 3.8 3.1* 3.9  CL 101 102 102 101  --   --  106  --   CO2  --   --  22 23  --   --  22  --   GLUCOSE 136* 130* 135* 96  --   --  131*  --   BUN 32* 40* 29* 22  --   --  14  --   CREATININE 0.90 0.90 0.87 0.72  --   --  0.54*  --   CALCIUM  --   --  8.6* 8.9  --   --  8.1*  --    GFR: Estimated Creatinine Clearance: 48 mL/min (A) (by C-G formula based on SCr of 0.54 mg/dL (L)). Recent Labs  Lab 06/30/21 0127 07/01/21 0426 07/02/21 0222 07/02/21 1553 07/02/21 1821 07/03/21 0249  WBC 22.5* 23.5* 24.9*  --   --  14.7*  LATICACIDVEN  --   --   --  0.9 0.7  --     Liver Function Tests: No results for input(s): AST, ALT, ALKPHOS, BILITOT, PROT, ALBUMIN in the last 168 hours. No results for input(s): LIPASE, AMYLASE in the last 168 hours. No results for input(s): AMMONIA in the last 168 hours.  ABG    Component Value  Date/Time   PHART 7.322 (L) 07/04/2021 0032   PCO2ART 40.5 07/04/2021 0032   PO2ART 239 (H) 07/04/2021 0032   HCO3 21.1 07/04/2021 0032   TCO2 22 07/04/2021 0032   ACIDBASEDEF 5.0 (H) 07/04/2021 0032   O2SAT 100.0 07/04/2021 0032     Coagulation Profile: Recent Labs  Lab 06/30/21 1608  INR 1.2    Cardiac Enzymes: No results for input(s): CKTOTAL, CKMB, CKMBINDEX, TROPONINI in the last 168 hours.  HbA1C: Hgb A1c MFr Bld  Date/Time Value Ref Range Status  05/13/2021 02:18 AM 5.2 4.8 - 5.6 % Final    Comment:    (  NOTE) Pre diabetes:          5.7%-6.4%  Diabetes:              >6.4%  Glycemic control for   <7.0% adults with diabetes     CBG: Recent Labs  Lab 07/03/21 0603 07/03/21 1316 07/03/21 1625 07/03/21 2114 07/04/21 0050  GLUCAP 170* 203* 100* 149* 158*    Review of Systems:   Unable to obtain.  Patient intubated/sedated.  Obtain information from chart review and nurse.  Past Medical History:  He,  has a past medical history of Diabetes mellitus without complication (HCC) and Hypertension.   Surgical History:   Past Surgical History:  Procedure Laterality Date   ABDOMINAL AORTOGRAM W/LOWER EXTREMITY N/A 05/12/2021   Procedure: ABDOMINAL AORTOGRAM W/LOWER EXTREMITY;  Surgeon: Nada Libman, MD;  Location: [facility];  Service: Cardiovascular;  Laterality: N/A;   AMPUTATION Left 05/13/2021   Procedure: AMPUTATION BELOW KNEE LEFT ;  Surgeon: Nada Libman, MD;  Location: [facility] OR;  Service: Vascular;  Laterality: Left;   AMPUTATION Left 05/20/2021   Procedure: AMPUTATION ABOVE KNEE;  Surgeon: Cephus Shelling, MD;  Location: [facility];  Service: Vascular;  Laterality: Left;   AMPUTATION Right 06/05/2021   Procedure: RIGHT FIFTH TOE AMPUTATION;  Surgeon: Nada Libman, MD;  Location: [facility] OR;  Service: Vascular;  Laterality: Right;   AMPUTATION Right 06/10/2021   Procedure: RIGHT THIRD AND  FOURTH TOE AMPUTATION;  Surgeon: Nada Libman, MD;   Location: [facility] OR;  Service: Vascular;  Laterality: Right;   AMPUTATION Right 06/21/2021   Procedure: RIGHT BELOW KNEE AMPUTATION;  Surgeon: Nada Libman, MD;  Location: [facility] OR;  Service: Vascular;  Laterality: Right;   APPLICATION OF WOUND VAC Right 06/30/2021   Procedure: APPLICATION OF PREVENA WOUND VAC;  Surgeon: Nada Libman, MD;  Location: [facility] OR;  Service: Vascular;  Laterality: Right;   ENDARTERECTOMY FEMORAL Right 06/05/2021   Procedure: RIGHT FEMORAL ENDARTERECTOMY;  Surgeon: Nada Libman, MD;  Location: [facility] OR;  Service: Vascular;  Laterality: Right;   FEMORAL-POPLITEAL BYPASS GRAFT Right 06/10/2021   Procedure: RIGHT FEMORAL TO ABOVE KNEE POPLITEAL ARTERY BYPASS GRAFTING USING RIGHT NON REVERSED GREATER SAPHENOUS VEIN;  Surgeon: Nada Libman, MD;  Location: [facility] OR;  Service: Vascular;  Laterality: Right;   HERNIA REPAIR     INSERTION OF ILIAC STENT Right 06/05/2021   Procedure: INSERTION OF RIGHT ILIAC AND SUPERFICIAL FEMORAL ARTERY STENT;  Surgeon: Nada Libman, MD;  Location: [facility] OR;  Service: Vascular;  Laterality: Right;   KNEE SURGERY     PATCH ANGIOPLASTY Right 06/05/2021   Procedure: PATCH ANGIOPLASTY OF RIGHT FEMORAL ARTERY USING XENOSURE BIOLOGIC PATCH;  Surgeon: Nada Libman, MD;  Location: [facility] OR;  Service: Vascular;  Laterality: Right;   REPAIR ILIAC ARTERY Right 06/27/2021   Procedure: RETROPERITONEAL EXPOSURE AND REPAIR EXTERNAL ILIAC ARTERY;  Surgeon: Chuck Hint, MD;  Location[facility];  Service: Vascular;  Laterality: Right;   VEIN HARVEST Right 06/10/2021   Procedure: VEIN HARVEST OF RIGHT GREATER SAPHENOUS VEIN;  Surgeon: Nada Libman, MD;  Location: [facility] OR;  Service: Vascular;  Laterality: Right;     Social History:   reports that he has been smoking cigarettes. He has been smoking an average of .5 packs per day. He has never used smokeless tobacco. He reports current alcohol use. He reports that he does not use drugs.   Family History:  His  family  history is not on file.   Allergies Allergies  Allergen Reactions   Lisinopril Swelling    Swelling of lips     Home Medications  Prior to Admission medications   Medication Sig Start Date End Date Taking? Authorizing Provider  amLODipine (NORVASC) 10 MG tablet Take 10 mg by mouth daily.    [provider]  aspirin 81 MG EC tablet Take 1 tablet (81 mg total) by mouth daily. Swallow whole. 05/23/21   Rhyne, Ames Coupe, PA-C  atenolol (TENORMIN) 25 MG tablet Take 1 tablet (25 mg total) by mouth 2 (two) times daily. Patient taking differently: Take 100 mg by mouth daily. 05/23/21   Rhyne, Ames Coupe, PA-C  atorvastatin (LIPITOR) 20 MG tablet Take 20 mg by mouth at bedtime.    [provider]  clopidogrel (PLAVIX) 75 MG tablet Take 1 tablet (75 mg total) by mouth daily. 06/24/21   Emilie Rutter, PA-C  cyanocobalamin 1000 MCG tablet Take 1,000 mcg by mouth daily.    [provider]  diclofenac Sodium (VOLTAREN) 1 % GEL Apply 2 g topically 3 (three) times daily. 06/05/21   Angiulli, Mcarthur Rossetti, PA-C  ferrous sulfate 325 (65 FE) MG tablet Take 1 tablet (325 mg total) by mouth daily with breakfast. 06/05/21   Angiulli, Mcarthur Rossetti, PA-C  folic acid (FOLVITE) 1 MG tablet Take 1 tablet (1 mg total) by mouth daily. 05/23/21   Rhyne, Ames Coupe, PA-C  gabapentin (NEURONTIN) 100 MG capsule Take 1 capsule (100 mg total) by mouth 2 (two) times daily. Patient taking differently: Take 100 mg by mouth 3 (three) times daily. 06/05/21   Angiulli, Mcarthur Rossetti, PA-C  hydrochlorothiazide (HYDRODIURIL) 25 MG tablet Take 25 mg by mouth daily.    [provider]  metFORMIN (GLUCOPHAGE) 500 MG tablet Take 500 mg by mouth daily with breakfast.    [provider]  methocarbamol (ROBAXIN) 750 MG tablet Take 750 mg by mouth 4 (four) times daily.    [provider]  Multiple Vitamin (MULTIVITAMIN WITH MINERALS) TABS tablet Take 1 tablet by mouth daily. 05/23/21   Rhyne,  Ames Coupe, PA-C  oxybutynin (DITROPAN) 5 MG tablet Take 0.5 tablets (2.5 mg total) by mouth 2 (two) times daily. 05/19/21   Angiulli, Mcarthur Rossetti, PA-C  oxyCODONE-acetaminophen (PERCOCET/ROXICET) 5-325 MG tablet Take 1-2 tablets by mouth every 4 (four) hours as needed for moderate pain. 06/05/21   Angiulli, Mcarthur Rossetti, PA-C  polyethylene glycol (MIRALAX / GLYCOLAX) 17 g packet Take 17 g by mouth daily as needed for mild constipation. 06/05/21   Angiulli, Mcarthur Rossetti, PA-C     Critical care time: 45 minutes    JD Anselm Lis [facility] 07/04/2021, 5:42 AM  Please see [email or web address] for pager details.  From 7A-7P if no response, please call [phone]. After hours, please call ELink [phone].

## 2021-07-04 NOTE — Anesthesia Procedure Notes (Signed)
Procedure Name: Intubation Date/Time: 07/04/2021 3:22 AM Performed by: Jamariya Davidoff T, CRNA Pre-anesthesia Checklist: Patient identified, Emergency Drugs available, Suction available and Patient being monitored Patient Re-evaluated:Patient Re-evaluated prior to induction Oxygen Delivery Method: Circle system utilized Preoxygenation: Pre-oxygenation with 100% oxygen Induction Type: IV induction Ventilation: Mask ventilation without difficulty Laryngoscope Size: Mac and 4 Grade View: Grade I Tube type: Oral Tube size: 7.5 mm Number of attempts: 1 Airway Equipment and Method: Stylet and Oral airway Placement Confirmation: ETT inserted through vocal cords under direct vision, positive ETCO2 and breath sounds checked- equal and bilateral Secured at: 23 cm Tube secured with: Tape Dental Injury: Teeth and Oropharynx as per pre-operative assessment

## 2021-07-04 NOTE — Anesthesia Procedure Notes (Signed)
Procedure Name: Intubation Date/Time: 07/03/2021 11:12 PM Performed by: Molli Hazard, CRNA Pre-anesthesia Checklist: Patient identified, Emergency Drugs available, Suction available and Patient being monitored Patient Re-evaluated:Patient Re-evaluated prior to induction Oxygen Delivery Method: Circle system utilized Preoxygenation: Pre-oxygenation with 100% oxygen Induction Type: IV induction and Rapid sequence Laryngoscope Size: Miller and 2 Grade View: Grade I Tube type: Oral Tube size: 7.5 mm Number of attempts: 1 Airway Equipment and Method: Stylet Placement Confirmation: ETT inserted through vocal cords under direct vision, positive ETCO2 and breath sounds checked- equal and bilateral Secured at: 24 cm Tube secured with: Tape Dental Injury: Teeth and Oropharynx as per pre-operative assessment

## 2021-07-04 NOTE — Progress Notes (Signed)
Initial Nutrition Assessment  DOCUMENTATION CODES:   Not applicable  INTERVENTION:   Once OG advanced further into stomach, start trickle feedings of:  -Vital 1.5 @ 20 ml/hr via OG -Goal rate: 50 ml/hr (1200 ml) -ProSource TF 45 ml TID  At goal rate TF provides: 1920 kcals, 114 grams protein, 917 ml free water.   NUTRITION DIAGNOSIS:   Increased nutrient needs related to post-op healing as evidenced by estimated needs.  GOAL:   Patient will meet greater than or equal to 90% of their needs  MONITOR:   Vent status, Skin, TF tolerance, Weight trends, Labs, I & O's  REASON FOR ASSESSMENT:   Consult Enteral/tube feeding initiation and management  ASSESSMENT:   Patient with PMH significant for PAD, L BKA, PVD, DM, and HTN. Presents this admission with large pseudoaneurysm of the R groin.  8/20- s/p retroperitoneal exposure of R iliac artery, exploration of R groin, repair of bovine pericardial patch angioplasty 8/23- s/p redo R femoral artery exposure, revision of R femoral endarterectomy, R femoral Wound VAC 8/27- s/p R groin exploration with common femoral repair, evacuation of R groin hematoma  Underwent reexploration of R groin, excision of R bovine pericardial patch, and VAC placement. Remains intubated. Xray confirms OG tip located in the stomach. Side port at GE junction. Recommend advancing prior to feeding. RN made aware. Okay to start trickles per CCM.   Weight shows decline from 61.2 kg on 7/5 to 57.5 kg this admission. Question height documented, will need to obtain actual height. Will need NFPE to assess for malnutrition.   UOP: 625 ml x 24 hrs JP drain (groin): 15 ml x 24 hrs   Drips: LR @ 100 ml/hr, propofol 7.8 ml/hr Medications: colace, SS novolog, miralax Labs: CBG 72-218  Diet Order:   Diet Order             Diet NPO time specified  Diet effective now                   EDUCATION NEEDS:   Not appropriate for education at this  time  Skin:  Skin Assessment: Skin Integrity Issues: Skin Integrity Issues:: Stage II, Incisions Stage II: coccyx Incisions: multiple R groin with VAC  Last BM:  8/27  Height:   Ht Readings from Last 1 Encounters:  07/04/21 4\' 5"  (1.346 m)    Weight:   Wt Readings from Last 1 Encounters:  07/04/21 57.5 kg    BMI:  Body mass index is 31.73 kg/m.  Estimated Nutritional Needs:   Kcal:  1800-2000 kcal  Protein:  100-120 grams  Fluid:  >/= 1.8 L/day  07/06/21 MS, RD, LDN, CNSC Clinical Nutrition Pager listed in AMION

## 2021-07-04 NOTE — Progress Notes (Signed)
Received pt from OR intubated with propofol drip infusing.Not following commands,received paralytics.Right groin with dressing in place and JP drain draining well.CCM at bedside.Will monitor closely.

## 2021-07-04 NOTE — Consult Note (Signed)
NAME:  Todd Duran, MRN:  [id number], DOB:  [birth year], LOS: 7 ADMISSION DATE:  06/27/2021, CONSULTATION DATE:  8/27 REFERRING MD:  Dr. Myra Gianotti, CHIEF COMPLAINT:   post procedural intubation  History of Present Illness:  Patient is a 75 year old male with PMH of DM T2, HTN, bilateral BKA presents to [facility] on 8/20 with pseudoaneurysm of R femoral artery.  On 7/13 patient received left BKA.  Patient was brought in on 7/29 for right toe amputation.  Right femoral to popliteal artery bypass grafting was done on 8/3.  On 8/13, patient presents with infected right foot and on 8/14 a right BKA was performed.   On 8/16 patient was sent to CIR.  On 8/20 vascular surgery consulted for bleeding on right groin and hemoglobin dropped to 6.9.  CT showing pseudoaneurysm right femoral artery.  On 8/20 vascular surgery performed surgery to explore pseudoaneurysm and repair of bovine pericardial patch angioplasty.  Wound VAC in place.  On 8/23, vascular surgery performed revision of right femoral retroperitoneal right iliac artery exposure, redo right femoral artery exposure, and endarterectomy and placement of multiple budgeted sutures for hemostasis.  Right femoral Prevena wound VAC postop cultures show Pseudomonas aeruginosa.  Started on cefepime.  On 8/26, acute bleed from right groin with VAC output of greater than 500 mL.  Performed right groin exploration and evacuation of right groin hematoma.  On 8/27, patient brought back to the OR for reexploration of right groin, ligation of right femoropopliteal vein bypass, ligation of right external iliac artery, excision of right bovine pericardial patch with excision of right common femoral artery, and negative pressure drain to right groin.  Patient remains intubated postprocedure due to unable to wean off ventilator.  PCCM consulted for ventilator management.   Pertinent  Medical History   Past Medical History:  Diagnosis Date   Diabetes mellitus without  complication (HCC)    Hypertension    Significant Hospital Events: Including procedures, antibiotic start and stop dates in addition to other pertinent events   8/27: right groin re-exploration and evacuation of hematoma, very friable vessels, ultimately opted for ligation.   Interim History / Subjective:  Returned to ICU following procedure. Received 6L of fluids intraoperatively, but may have still been behind on fluid going in per Dr Chestine Spore  Objective   Blood pressure 100/68, pulse 94, temperature 97.8 F (36.6 C), temperature source Oral, resp. rate (!) 22, height 4\' 5"  (1.346 m), weight 57.5 kg, SpO2 100 %.    Vent Mode: PRVC FiO2 (%):  [40 %-100 %] 40 % Set Rate:  [16 bmp-22 bmp] 22 bmp Vt Set:  [500 mL] 500 mL PEEP:  [5 cmH20] 5 cmH20 Plateau Pressure:  [13 cmH20-14 cmH20] 14 cmH20   Intake/Output Summary (Last 24 hours) at 07/04/2021 1348 Last data filed at 07/04/2021 0700 Gross per 24 hour  Intake 7953.14 ml  Output 990 ml  Net 6963.14 ml    Filed Weights   07/04/21 0600  Weight: 57.5 kg    Examination: General:  critically ill appearing on mech vent HEENT: MM pink/moist; ETT in place Neuro: sedated and unresponsive to voice or noxious stimuli but appears uncomfortable.  CV: s1s2, sinus tach, no m/r/g, extremities cool, including right stump.  PULM:  ++ accessory muscle use. Chest clear GI: soft, bsx4 active  Extremities: warm/dry,  BLE BKA - serosanguinous fluid in JP  Skin: no rashes or lesions    Resolved Hospital Problem list     Assessment & Plan:  Critically ill due tod Acute respiratory failure with hypoxia s/p reexploration of right groin S/P ligation of right femoropopliteal vein bypass  ligation of right external iliac artery, excision of right bovine pericardial patch with excision of right common femoral artery negative pressure drain to right groin Pseudomonas aeruginosa Right femoral Prevena wound  ABLA T2DM - adequate controlled.  Hx of  HTN  Plan:  - Patient appeared uncomfortable and possibly still hypovolemic. Fentanyl increased and given RL. With this tachycardia and tachypnea have improved.  - Will hold on extubation - trend hemoglobin.  - If remains hemodynamically stable and doesn't show signs of further bleeding will plan to extubate tomorrow.  - Continue cefepime for extended course especially given presence of graft material - recommend 4-6 weeks of treatment. Will place PICC - resume oral medications once extubated - very high nutritional risk - will start tube feeds at trickle and increase if not extubated tomorrow. Might benefit from enteral feeding if not able to take in adequate calories.   Best Practice (right click and "Reselect all SmartList Selections" daily)   Diet/type: NPO w/ meds via tube DVT prophylaxis: other will hold off initially post procedure GI prophylaxis: PPI Lines: N/A Foley:  Yes, and it is still needed Code Status:  full code Last date of multidisciplinary goals of care discussion [pending]  Labs   CBC: Recent Labs  Lab 06/30/21 0127 06/30/21 1023 07/01/21 0426 07/02/21 0222 07/03/21 0249 07/04/21 0032 07/04/21 0344 07/04/21 0175 07/04/21 0554 07/04/21 0629 07/04/21 0913  WBC 22.5*  --  23.5* 24.9* 14.7*  --   --   --   --  31.9*  --   HGB 7.9*   < > 8.0* 7.9* 7.4*   < > 8.2* 7.5* 10.2* 11.3* 12.9*  HCT 23.5*   < > 23.5* 23.8* 22.7*   < > 24.0* 22.0* 30.0* 33.2* 38.0*  MCV 83.6  --  84.5 85.6 86.0  --   --   --   --  86.0  --   PLT 382  --  330 352 304  --   --   --   --  298  --    < > = values in this interval not displayed.     Basic Metabolic Panel: Recent Labs  Lab 06/28/21 0113 06/30/21 0127 06/30/21 1023 07/03/21 0249 07/04/21 0032 07/04/21 0438 07/04/21 0554 07/04/21 0629 07/04/21 0839 07/04/21 0913  NA 133* 131*   < > 135   < > 141 139 137 138 138  K 4.3 3.7   < > 3.1*   < > 3.6 4.0 4.1 3.8 4.9  CL 102 101  --  106  --   --   --  109 111   --   CO2 22 23  --  22  --   --   --  22 19*  --   GLUCOSE 135* 96  --  131*  --   --   --  242* 226*  --   BUN 29* 22  --  14  --   --   --  14 16  --   CREATININE 0.87 0.72  --  0.54*  --   --   --  0.59* 0.61  --   CALCIUM 8.6* 8.9  --  8.1*  --   --   --  7.6* 7.2*  --    < > = values in this interval not displayed.    GFR:  Estimated Creatinine Clearance: 50.4 mL/min (by C-G formula based on SCr of 0.61 mg/dL). Recent Labs  Lab 07/01/21 0426 07/02/21 0222 07/02/21 1553 07/02/21 1821 07/03/21 0249 07/04/21 0629  WBC 23.5* 24.9*  --   --  14.7* 31.9*  LATICACIDVEN  --   --  0.9 0.7  --   --      Liver Function Tests: Recent Labs  Lab 07/04/21 0629  AST 19  ALT 15  ALKPHOS 122  BILITOT 1.9*  PROT 5.2*  ALBUMIN 1.7*   No results for input(s): LIPASE, AMYLASE in the last 168 hours. No results for input(s): AMMONIA in the last 168 hours.  ABG    Component Value Date/Time   PHART 7.359 07/04/2021 0913   PCO2ART 36.8 07/04/2021 0913   PO2ART 59 (L) 07/04/2021 0913   HCO3 20.8 07/04/2021 0913   TCO2 22 07/04/2021 0913   ACIDBASEDEF 4.0 (H) 07/04/2021 0913   O2SAT 90.0 07/04/2021 0913      Coagulation Profile: Recent Labs  Lab 06/30/21 1608  INR 1.2     Cardiac Enzymes: No results for input(s): CKTOTAL, CKMB, CKMBINDEX, TROPONINI in the last 168 hours.  HbA1C: Hgb A1c MFr Bld  Date/Time Value Ref Range Status  05/13/2021 02:18 AM 5.2 4.8 - 5.6 % Final    Comment:    (NOTE) Pre diabetes:          5.7%-6.4%  Diabetes:              >6.4%  Glycemic control for   <7.0% adults with diabetes     CBG: Recent Labs  Lab 07/03/21 2114 07/04/21 0050 07/04/21 0555 07/04/21 0816 07/04/21 1230  GLUCAP 149* 158* 205* 218* 72    CRITICAL CARE Performed by: Lynnell Catalan   Total critical care time: 40 minutes  Critical care time was exclusive of separately billable procedures and treating other patients.  Critical care was necessary to treat or  prevent imminent or life-threatening deterioration.  Critical care was time spent personally by me on the following activities: development of treatment plan with patient and/or surrogate as well as nursing, discussions with consultants, evaluation of patient's response to treatment, examination of patient, obtaining history from patient or surrogate, ordering and performing treatments and interventions, ordering and review of laboratory studies, ordering and review of radiographic studies, pulse oximetry, re-evaluation of patient's condition and participation in multidisciplinary rounds.  Lynnell Catalan, MD [facility] ICU Physician [facility]  Pager: [phone] Mobile: [phone] After hours: [phone].

## 2021-07-04 NOTE — Transfer of Care (Signed)
Immediate Anesthesia Transfer of Care Note  Patient: Todd Duran  Procedure(s) Performed: FEMORAL ARTERY RE- EXPLORATION AND Ligation of Right Fermoral/Popliteal Bypass and Right External Iliac Artery and Excision right Bovine patch and common Femoral Artery. (Right: Groin)  Patient Location: ICU  Anesthesia Type:General  Level of Consciousness: Patient remains intubated per anesthesia plan  Airway & Oxygen Therapy: Patient remains intubated per anesthesia plan and Patient placed on Ventilator (see vital sign flow sheet for setting)  Post-op Assessment: Report given to RN and Post -op Vital signs reviewed and stable  Post vital signs: Reviewed and stable  Last Vitals:  Vitals Value Taken Time  BP 140/95 07/04/21 0545  Temp    Pulse 124 07/04/21 0557  Resp 19 07/04/21 0557  SpO2 100 % 07/04/21 0557  Vitals shown include unvalidated device data.  Last Pain:  Vitals:   07/04/21 0142  TempSrc:   PainSc: Asleep      Patients Stated Pain Goal: 4 (07/04/21 0052)  Complications: No notable events documented.

## 2021-07-04 NOTE — Progress Notes (Signed)
Belongings taken to [phone]

## 2021-07-04 NOTE — Transfer of Care (Signed)
Immediate Anesthesia Transfer of Care Note  Patient: Todd Duran  Procedure(s) Performed: FEMORAL ARTERY EXPLORATION WITH COMMON ARTERY REPAIR, evacuation of hematoma.  (Right: Groin)  Patient Location: PACU  Anesthesia Type:General  Level of Consciousness: awake, alert  and oriented  Airway & Oxygen Therapy: Patient connected to nasal cannula oxygen  Post-op Assessment: Report given to RN and Post -op Vital signs reviewed and stable  Post vital signs: Reviewed and stable  Last Vitals:  Vitals Value Taken Time  BP    Temp 98   Pulse 133 07/04/21 0053  Resp 18 07/04/21 0054  SpO2 100 % 07/04/21 0053  Vitals shown include unvalidated device data.  Last Pain:  Vitals:   07/03/21 1936  TempSrc: Oral  PainSc:       Patients Stated Pain Goal: 0 (07/02/21 0035)  Complications: No notable events documented.

## 2021-07-04 NOTE — Progress Notes (Signed)
eLink Physician-Brief Progress Note Patient Name: Todd Duran DOB: 01-Feb-1946 MRN: [id number]   Date of Service  07/04/2021  HPI/Events of Note  Patient is s/p Femoral artery exploration, hematoma evacuation, ligation of Fem-pop graft, and common femoral artery repair, patient was admitted to the ICU secondary to post-operative respiratory failure.  eICU Interventions  New Patient Evaluation.        [name] [name] 07/04/2021, 5:55 AM

## 2021-07-04 NOTE — Progress Notes (Signed)
eLink Physician-Brief Progress Note Patient Name: Todd Duran DOB: [birth year] MRN: [id number]   Date of Service  07/04/2021  HPI/Events of Note  please change the CBG' s scheduled as AC/HS to q 4 with appropriate SSI,  pt on vent with 8pm CBG as 108    TF on board viatal 1.5 at 20cc/hr  eICU Interventions  Changed to q4 hrly. Goal < 180.      Intervention Category Intermediate Interventions: Hyperglycemia - evaluation and treatment  Ranee Gosselin 07/04/2021, 7:57 PM

## 2021-07-04 NOTE — Progress Notes (Signed)
Called that patient has profuse bleeding from the right groin again after urgent repair several hours ago.  Manual pressure being held.  I have posted him for emergent back to the OR.  Will likely ligate and excise all the infected patch.  Cephus Shelling, MD [facility] Office: [phone]   Cephus Shelling

## 2021-07-04 NOTE — Op Note (Addendum)
Date: July 04, 2021  Preoperative diagnosis: Right groin bleed  Postoperative diagnosis: Same  Procedure: 1.  Right groin exploration with repair of common femoral artery 2.  Evacuation of right groin hematoma  Surgeon: Dr. Cephus Shelling, MD  Assistant: Doreatha Massed, PA  Indication: Patient is a 75 year old male who has undergone complex revascularization of the right lower extremity including a right common femoral endarterectomy with bovine patch as well as a common femoral to popliteal bypass with vein by Dr. Myra Gianotti.  He has had multiple bleeding events requiring return to the OR earlier this week.  He had another bleeding event tonight.  He presents urgently after risk benefits discussed  Findings: There was significant hematoma in the groin that was evacuated.  There was arterial bleeding noted from the medial side of the patch on the right common femoral artery.  This was primarily repaired with pledgeted suture with 5-0 Prolene.  In the process of doing this the proximal anastomosis of the right leg bypass became disrupted from friability and also had to be repaired with pledgeted suture.  All the back wall of the native artery was completely socked in otherwise.  Soft tissue very friable as well.    Anesthesia: General  Details: Patient was taken to the operating room after informed consent was obtained.  Placed on the operative table in supine position.  General endotracheal anesthesia was induced.  The right groin was prepped and draped in usual sterile fashion.  I removed his previous staples and reopened his right groin incision.  We evacuated all the hematoma here.  There was evidence of active arterial bleeding from the medial side of the patch that was controlled with digital pressure.  I elected to place a 5-0 Prolene pledgeted repair suture here given the remaining patch appeared intact.  The artery has become quite friable and ultimately the proximal anastomosis  became partially disrupted in the process of repairing the medial patch which was just adjoining the area repaired.  I then repaired this with a pledgeted repair sutures as well with 5-0 Prolene.  The additional option would be removing all of the patch and using a vein patch but unfortunately I think this would be high risk given that the groin is quite friable at this time.  If he has another bleeding event ultimately will require ligation of the artery.  The groin was copiously irrigated.  It was closed in multiple layers of 2-0 Vicryl, 3-0 Vicryl and staples.  Complication: None  Condition: Stable  Cephus Shelling, MD [facility] [place] Office: [phone]   Cephus Shelling

## 2021-07-04 NOTE — Op Note (Signed)
Date: July 04, 2021  Preoperative diagnosis: Right groin bleed  Postoperative diagnosis: Same  Procedure: 1.  Reexploration of right groin 2.  Ligation of right femoropopliteal vein bypass 3.  Ligation of right external iliac artery 4.  Excision of right bovine pericardial patch with excision of right common femoral artery 5.  Placement of negative pressure drain right groin (15 [place])  Surgeon: Dr. Cephus Shelling, MD  Assistant: Doreatha Massed, PA  Indications: 75 year old male well-known to our service who has had recurrent right groin bleeds in the setting of complex revascularization.  He bled earlier tonight and we took him to the OR and he was bleeding from the patch and this was repaired primarily.  Ultimately several hours later had a recurrent bleed tonight.  I discussed ligation of his artery and excision of the patch.  This is an infected groin.  Findings: The right common femoral artery was friable and essentially the patch was melting off the artery.  The right femoropopliteal bypass was ligated.  I elected to reopen the transplant incision and the right external iliac artery was ligated using multiple 4-0 and 5-0 Prolene's.  It should be noted that this was a heavily calcified diseased artery.  I then excised the bovine pericardial patch off the common femoral artery.  The distal SFA profundus stump was oversewn with backbleeding from the profunda prior to ligation with 5-0 Prolene.  Most of the common femoral artery was excised as well.  15 French drain was placed.  Anesthesia: General  Details: Patient was taken to the operating room after informed consent was obtained.  Placed on the operative table in supine position.  General endotracheal anesthesia was induced.  Right groin and right abdominal wall was prepped and draped in usual sterile fashion.  Initially reopened the right groin incision and there was again very large amount of hematoma.  The most recent repair  was hemostatic.  There were new areas of hemorrhage around the patch.  The patch was essentially melting away from the common femoral artery.  I elected to ligate his native system.  I then reopened the transplant incision.  We got control of the external iliac artery and this was heavily calcified and I placed a Henley clamp on it for proximal control.  Ultimately the femoropopliteal bypass was then ligated in the groin with multiple 2-0 silks and divided near the bovine patch.  I then divided the external iliac artery and oversewed the stump with multiple 4-0 Prolene and 5-0 Prolene sutures with pretty good hemostasis.  We then went in the groin and I excised all of the bovine pericardial patch.  I got down to the native artery where there was the confluence of the SFA profunda and this was oversewn with a 5-0 Prolene as a common trunk.  Most of the common femoral artery was debrided away in the groin along with the bovine patch.  I placed a 15 French drain from the groin incision up through the inguinal ligament into the RP exposure.  The groin was closed in multiple layers of 2-0 Vicryl, 3-0 Vicryl, and staples.  The transplant incision external oblique fascia was closed with #1 single-stranded PDS, 3-0 Vicryl, and staples.  Patient will remain intubated and critically ill going to the ICU.  Complications: None  Condition: Critical  Cephus Shelling, MD [facility] [place] Office: [phone]   Cephus Shelling

## 2021-07-04 NOTE — Progress Notes (Signed)
Received pt from PACU. VSS. R groin gauze dressing CDI. Pt w/ no complaints. Will continue to monitor.  Margarito Liner, RN

## 2021-07-05 ENCOUNTER — Inpatient Hospital Stay (HOSPITAL_COMMUNITY): Payer: No Typology Code available for payment source

## 2021-07-05 DIAGNOSIS — J9601 Acute respiratory failure with hypoxia: Secondary | ICD-10-CM

## 2021-07-05 DIAGNOSIS — Z9889 Other specified postprocedural states: Secondary | ICD-10-CM

## 2021-07-05 DIAGNOSIS — I97618 Postprocedural hemorrhage and hematoma of a circulatory system organ or structure following other circulatory system procedure: Secondary | ICD-10-CM

## 2021-07-05 LAB — HEMOGLOBIN AND HEMATOCRIT, BLOOD
HCT: 27.6 % — ABNORMAL LOW (ref 39.0–52.0)
HCT: 28.4 % — ABNORMAL LOW (ref 39.0–52.0)
HCT: 27.3 % — ABNORMAL LOW (ref 39.0–52.0)
HCT: 30.3 % — ABNORMAL LOW (ref 39.0–52.0)
Hemoglobin: 9.1 g/dL — ABNORMAL LOW (ref 13.0–17.0)
Hemoglobin: 9.4 g/dL — ABNORMAL LOW (ref 13.0–17.0)
Hemoglobin: 8.9 g/dL — ABNORMAL LOW (ref 13.0–17.0)
Hemoglobin: 9.6 g/dL — ABNORMAL LOW (ref 13.0–17.0)

## 2021-07-05 LAB — BASIC METABOLIC PANEL
Anion gap: 4 — ABNORMAL LOW (ref 5–15)
Anion gap: 7 (ref 5–15)
BUN: 16 mg/dL (ref 8–23)
BUN: 17 mg/dL (ref 8–23)
CO2: 21 mmol/L — ABNORMAL LOW (ref 22–32)
CO2: 20 mmol/L — ABNORMAL LOW (ref 22–32)
Calcium: 7.9 mg/dL — ABNORMAL LOW (ref 8.9–10.3)
Calcium: 7.9 mg/dL — ABNORMAL LOW (ref 8.9–10.3)
Chloride: 113 mmol/L — ABNORMAL HIGH (ref 98–111)
Chloride: 110 mmol/L (ref 98–111)
Creatinine, Ser: 0.61 mg/dL (ref 0.61–1.24)
Creatinine, Ser: 0.61 mg/dL (ref 0.61–1.24)
GFR, Estimated: >60 mL/min (ref >60)
GFR, Estimated: >60 mL/min (ref >60)
Glucose, Bld: 126 mg/dL — ABNORMAL HIGH (ref 70–99)
Glucose, Bld: 190 mg/dL — ABNORMAL HIGH (ref 70–99)
Potassium: 3.6 mmol/L (ref 3.5–5.1)
Potassium: 4.4 mmol/L (ref 3.5–5.1)
Sodium: 138 mmol/L (ref 135–145)
Sodium: 137 mmol/L (ref 135–145)

## 2021-07-05 LAB — TROPONIN I (HIGH SENSITIVITY)
Troponin I (High Sensitivity): 126 ng/L (ref <18)
Troponin I (High Sensitivity): 192 ng/L (ref <18)

## 2021-07-05 LAB — GLUCOSE, CAPILLARY
Glucose-Capillary: 105 mg/dL — ABNORMAL HIGH (ref 70–99)
Glucose-Capillary: 114 mg/dL — ABNORMAL HIGH (ref 70–99)
Glucose-Capillary: 162 mg/dL — ABNORMAL HIGH (ref 70–99)
Glucose-Capillary: 164 mg/dL — ABNORMAL HIGH (ref 70–99)
Glucose-Capillary: 170 mg/dL — ABNORMAL HIGH (ref 70–99)
Glucose-Capillary: 82 mg/dL (ref 70–99)

## 2021-07-05 LAB — AEROBIC/ANAEROBIC CULTURE W GRAM STAIN (SURGICAL/DEEP WOUND)

## 2021-07-05 LAB — BPAM FFP
Blood Product Expiration Date: 202208312359
Blood Product Expiration Date: 202208312359
Blood Product Expiration Date: 202208312359
Blood Product Expiration Date: 202208312359
ISSUE DATE / TIME: 202208262336
ISSUE DATE / TIME: 202208262336
ISSUE DATE / TIME: 202208270328
ISSUE DATE / TIME: 202208270328
Unit Type and Rh: 600
Unit Type and Rh: 6200
Unit Type and Rh: 6200
Unit Type and Rh: 6200

## 2021-07-05 LAB — PREPARE FRESH FROZEN PLASMA
Unit division: 0
Unit division: 0
Unit division: 0

## 2021-07-05 LAB — PHOSPHORUS
Phosphorus: 1.9 mg/dL — ABNORMAL LOW (ref 2.5–4.6)
Phosphorus: 1.8 mg/dL — ABNORMAL LOW (ref 2.5–4.6)

## 2021-07-05 LAB — MAGNESIUM
Magnesium: 2 mg/dL (ref 1.7–2.4)
Magnesium: 2.1 mg/dL (ref 1.7–2.4)
Magnesium: 1.8 mg/dL (ref 1.7–2.4)

## 2021-07-05 LAB — TRIGLYCERIDES: Triglycerides: 102 mg/dL (ref <150)

## 2021-07-05 MED ORDER — PANTOPRAZOLE SODIUM 40 MG PO PACK
40.0000 mg | PACK | Freq: Every day | ORAL | Status: DC
  Administered 2021-07-06: 40 mg via ORAL
  Filled 2021-07-05: qty 20

## 2021-07-05 MED ORDER — FUROSEMIDE 10 MG/ML IJ SOLN
40.0000 mg | Freq: Once | INTRAMUSCULAR | Status: AC
  Administered 2021-07-05: 40 mg via INTRAVENOUS
  Filled 2021-07-05: qty 4

## 2021-07-05 MED ORDER — POTASSIUM PHOSPHATES 15 MMOLE/5ML IV SOLN
20.0000 mmol | Freq: Once | INTRAVENOUS | Status: AC
  Administered 2021-07-05: 20 mmol via INTRAVENOUS
  Filled 2021-07-05: qty 6.67

## 2021-07-05 MED ORDER — SODIUM CHLORIDE 0.9% FLUSH
10.0000 mL | INTRAVENOUS | Status: DC | PRN
  Administered 2021-07-23: 20 mL

## 2021-07-05 MED ORDER — SODIUM CHLORIDE 0.9 % IV SOLN
INTRAVENOUS | Status: DC | PRN
  Administered 2021-07-05 – 2021-07-09 (×2): 1000 mL via INTRAVENOUS
  Administered 2021-07-15: 250 mL via INTRAVENOUS

## 2021-07-05 MED ORDER — ORAL CARE MOUTH RINSE
15.0000 mL | OROMUCOSAL | Status: DC
  Administered 2021-07-05 (×4): 15 mL via OROMUCOSAL

## 2021-07-05 MED ORDER — ORAL CARE MOUTH RINSE
15.0000 mL | Freq: Two times a day (BID) | OROMUCOSAL | Status: DC
  Administered 2021-07-05 – 2021-07-27 (×37): 15 mL via OROMUCOSAL

## 2021-07-05 MED ORDER — SODIUM CHLORIDE 0.9 % IV SOLN
20.0000 mmol | Freq: Once | INTRAVENOUS | Status: AC
  Administered 2021-07-05: 20 mmol via INTRAVENOUS
  Filled 2021-07-05: qty 6.67

## 2021-07-05 MED ORDER — GABAPENTIN 250 MG/5ML PO SOLN
100.0000 mg | Freq: Three times a day (TID) | ORAL | Status: DC
  Filled 2021-07-05 (×3): qty 2

## 2021-07-05 MED ORDER — POLYETHYLENE GLYCOL 3350 17 G PO PACK
17.0000 g | PACK | Freq: Every day | ORAL | Status: DC
  Administered 2021-07-09 – 2021-07-17 (×8): 17 g via ORAL
  Filled 2021-07-05 (×8): qty 1

## 2021-07-05 MED ORDER — FENTANYL CITRATE (PF) 100 MCG/2ML IJ SOLN
50.0000 ug | INTRAMUSCULAR | Status: DC | PRN
  Administered 2021-07-05 – 2021-07-07 (×8): 50 ug via INTRAVENOUS
  Filled 2021-07-05 (×9): qty 2

## 2021-07-05 MED ORDER — CHLORHEXIDINE GLUCONATE 0.12% ORAL RINSE (MEDLINE KIT)
15.0000 mL | Freq: Two times a day (BID) | OROMUCOSAL | Status: DC
  Administered 2021-07-05: 15 mL via OROMUCOSAL

## 2021-07-05 MED ORDER — SODIUM CHLORIDE 0.9% FLUSH
10.0000 mL | Freq: Two times a day (BID) | INTRAVENOUS | Status: DC
  Administered 2021-07-05 (×2): 10 mL
  Administered 2021-07-06: 20 mL
  Administered 2021-07-06 – 2021-07-10 (×5): 10 mL
  Administered 2021-07-11: 20 mL
  Administered 2021-07-11 – 2021-07-13 (×4): 10 mL
  Administered 2021-07-13: 20 mL
  Administered 2021-07-14: 30 mL
  Administered 2021-07-15: 20 mL
  Administered 2021-07-16 – 2021-07-18 (×4): 10 mL
  Administered 2021-07-20: 20 mL
  Administered 2021-07-20: 10 mL
  Administered 2021-07-21: 20 mL
  Administered 2021-07-21 – 2021-07-23 (×3): 10 mL
  Administered 2021-07-23: 20 mL
  Administered 2021-07-25 – 2021-07-27 (×6): 10 mL

## 2021-07-05 MED ORDER — DEXMEDETOMIDINE HCL IN NACL 400 MCG/100ML IV SOLN
0.4000 ug/kg/h | INTRAVENOUS | Status: DC
  Administered 2021-07-05 – 2021-07-06 (×2): 0.4 ug/kg/h via INTRAVENOUS
  Filled 2021-07-05 (×2): qty 100

## 2021-07-05 MED ORDER — ATORVASTATIN CALCIUM 40 MG PO TABS
40.0000 mg | ORAL_TABLET | Freq: Every day | ORAL | Status: DC
  Administered 2021-07-06 – 2021-07-16 (×11): 40 mg via ORAL
  Filled 2021-07-05 (×11): qty 1

## 2021-07-05 MED ORDER — METHOCARBAMOL 500 MG PO TABS
750.0000 mg | ORAL_TABLET | Freq: Four times a day (QID) | ORAL | Status: DC
  Administered 2021-07-06 – 2021-07-17 (×45): 750 mg via ORAL
  Filled 2021-07-05 (×45): qty 2

## 2021-07-05 MED ORDER — ADULT MULTIVITAMIN LIQUID CH
15.0000 mL | Freq: Every day | ORAL | Status: DC
  Administered 2021-07-06: 15 mL via ORAL
  Filled 2021-07-05: qty 15

## 2021-07-05 MED ORDER — DOCUSATE SODIUM 50 MG/5ML PO LIQD
100.0000 mg | Freq: Two times a day (BID) | ORAL | Status: DC
  Administered 2021-07-06: 100 mg via ORAL
  Filled 2021-07-05: qty 10

## 2021-07-05 MED ORDER — MAGNESIUM SULFATE IN D5W 1-5 GM/100ML-% IV SOLN
1.0000 g | Freq: Once | INTRAVENOUS | Status: AC
  Administered 2021-07-05: 1 g via INTRAVENOUS
  Filled 2021-07-05: qty 100

## 2021-07-05 MED ORDER — ASPIRIN 81 MG PO CHEW
81.0000 mg | CHEWABLE_TABLET | Freq: Every day | ORAL | Status: DC
  Administered 2021-07-06 – 2021-07-17 (×11): 81 mg via ORAL
  Filled 2021-07-05 (×11): qty 1

## 2021-07-05 MED ORDER — CLOPIDOGREL BISULFATE 75 MG PO TABS
75.0000 mg | ORAL_TABLET | Freq: Every day | ORAL | Status: DC
  Administered 2021-07-06 – 2021-07-17 (×11): 75 mg via ORAL
  Filled 2021-07-05 (×11): qty 1

## 2021-07-05 MED ORDER — METOPROLOL TARTRATE 5 MG/5ML IV SOLN
5.0000 mg | Freq: Four times a day (QID) | INTRAVENOUS | Status: DC
  Filled 2021-07-05: qty 5

## 2021-07-05 MED ORDER — METOPROLOL TARTRATE 5 MG/5ML IV SOLN
INTRAVENOUS | Status: AC
  Administered 2021-07-05: 5 mg
  Filled 2021-07-05: qty 5

## 2021-07-05 NOTE — Progress Notes (Signed)
[facility] [place]  Subjective  -remains intubated and on propofol.   Objective 124/72 (!) 113 98 F (36.7 C) (Axillary) (!) 21 100%  Intake/Output Summary (Last 24 hours) at 07/05/2021 0834 Last data filed at 07/05/2021 0700 Gross per 24 hour  Intake 1743.16 ml  Output 535 ml  Net 1208.16 ml    Right groin and transplant incision clean dry and intact.   Drain output 75 mL over past 24 hours, serosanguinous. Right thigh warm.  Right BKA cold.   Laboratory Lab Results: Recent Labs    07/03/21 0249 07/04/21 0032 07/04/21 0629 07/04/21 0913 07/05/21 0323 07/05/21 0806  WBC 14.7*  --  31.9*  --   --   --   HGB 7.4*   < > 11.3*   < > 9.1* 9.4*  HCT 22.7*   < > 33.2*   < > 27.6* 28.4*  PLT 304  --  298  --   --   --    < > = values in this interval not displayed.   BMET Recent Labs    07/04/21 0839 07/04/21 0913 07/05/21 0323  NA 138 138 138  K 3.8 4.9 3.6  CL 111  --  113*  CO2 19*  --  21*  GLUCOSE 226*  --  126*  BUN 16  --  16  CREATININE 0.61  --  0.61  CALCIUM 7.2*  --  7.9*    COAG Lab Results  Component Value Date   INR 1.2 06/30/2021   No results found for: PTT  Assessment/Planning:  75 year old male went to the OR twice Friday night for continued profuse bleeding from the right groin.  On the second trip I ligated his external iliac artery as well as his right leg bypass and excised the infected patch in the right common femoral artery.  Cultures are growing Pseudomonas.  He is on IV cefepime which is sensitive based on recent cultures.  He remains critically ill and is intubated in the ICU.    I have discussed with critical care this morning about weaning to extubate.  Hemoglobin has remained stable overnight from 9.1 to 9.4.  Minimal output from drain of only 75 mL looks serosanguineous.  I changed his dressings and all these look clean and dry.  Think his tachycardia is from agitation hopefully this will improve once  we get the ET tube out this morning.  We will have to watch how his BKA does in the days ahead.  The stump is cold but his thigh is warm.   Cephus Shelling 07/05/2021 8:34 AM --

## 2021-07-05 NOTE — Procedures (Signed)
Extubation Procedure Note  Patient Details:   Name: Todd Duran DOB: Mar 15, 1946 MRN: [id number]   Airway Documentation:    Vent end date: 07/05/21 Vent end time: 1515   Evaluation  O2 sats: stable throughout Complications: No apparent complications Patient did tolerate procedure well. Bilateral Breath Sounds: Diminished   Yes  Pt extubated to 4l [place] with RN X2 at bedside. Positive cuff leak noted. Pt is tolerating well, RT will continue to monitor.  Forest Becker 07/05/2021, 3:27 PM

## 2021-07-05 NOTE — Anesthesia Postprocedure Evaluation (Signed)
Anesthesia Post Note  Patient: Todd Duran  Procedure(s) Performed: FEMORAL ARTERY RE- EXPLORATION AND Ligation of Right Fermoral/Popliteal Bypass and Right External Iliac Artery and Excision right Bovine patch and common Femoral Artery. (Right: Groin)     Patient location during evaluation: SICU Anesthesia Type: General Level of consciousness: sedated and patient remains intubated per anesthesia plan Pain management: pain level controlled Vital Signs Assessment: post-procedure vital signs reviewed and stable Respiratory status: patient remains intubated per anesthesia plan Cardiovascular status: stable Anesthetic complications: no   No notable events documented.  Last Vitals:  Vitals:   07/05/21 1600 07/05/21 1700  BP: 112/70 122/75  Pulse: 98 97  Resp: (!) 22 (!) 26  Temp:    SpO2: 100% 97%    Last Pain:  Vitals:   07/05/21 1708  TempSrc:   PainSc: 8                  [name]

## 2021-07-05 NOTE — Significant Event (Addendum)
[facility] COVERAGE CRITICAL CARE PROGRESS NOTE  CTSP re: respiratory distress. Patient was extubated earlier today after surgeries yesterday. At the time of clinical encounter, the patient is awake and alert.  Visible respiratoy distress, tachypneic.  Hpertensive. Grabbing the bed rails.  BIPAP just applied.  Appears to be tolerating.  On exam, coarse crackles and rhonchi in the L lung. Crackles in the R lung.  EKG shows tachyarrhythmia with TWI in V5, V6.  Principal Problem:   Acute hypercapnic respiratory failure (HCC) Active Problems:   Encounter for postanesthesia care   Pseudomonas infection   Wound infection after surgery   Pseudoaneurysm (HCC)   Peripheral artery disease (HCC)   Acute blood loss anemia  Trial of Precedex while on BiPAP. ABG now. Check troponins. Furosemide 40 mg IV single dose now. CXR now. Takes atenolol, HCTZ and amlodipine for HTN at home.  May be experiencing a component of beta blocker withdrawal.  Start metoprolol 5 mg IV q 6 hr.  Marcelle Smiling, MD Board Certified by the ABIM, Pulmonary Diseases & Critical Care Medicine

## 2021-07-05 NOTE — Anesthesia Postprocedure Evaluation (Signed)
Anesthesia Post Note  Patient: Todd Duran  Procedure(s) Performed: FEMORAL ARTERY EXPLORATION WITH COMMON ARTERY REPAIR, evacuation of hematoma.  (Right: Groin)     Patient location during evaluation: Other Anesthesia Type: General Level of consciousness: sedated and patient cooperative Pain management: pain level controlled Vital Signs Assessment: post-procedure vital signs reviewed and stable Respiratory status: spontaneous breathing Cardiovascular status: unstable Anesthetic complications: no   No notable events documented.  Last Vitals:  Vitals:   07/05/21 0600 07/05/21 0700  BP: 129/67 130/82  Pulse: 96 (!) 106  Resp: (!) 22 (!) 22  Temp:    SpO2: 100% 100%    Last Pain:  Vitals:   07/04/21 2342  TempSrc: Axillary  PainSc:                  [name]

## 2021-07-05 NOTE — Consult Note (Signed)
NAME:  Todd Duran, MRN:  [id number], DOB:  Apr 24, 1946, LOS: 8 ADMISSION DATE:  06/27/2021, CONSULTATION DATE:  8/27 REFERRING MD:  Dr. Myra Gianotti, CHIEF COMPLAINT:   post procedural intubation  History of Present Illness:  Patient is a 75 year old male with PMH of DM T2, HTN, bilateral BKA presents to [facility] on 8/20 with pseudoaneurysm of R femoral artery.  On 7/13 patient received left BKA.  Patient was brought in on 7/29 for right toe amputation.  Right femoral to popliteal artery bypass grafting was done on 8/3.  On 8/13, patient presents with infected right foot and on 8/14 a right BKA was performed.   On 8/16 patient was sent to CIR.  On 8/20 vascular surgery consulted for bleeding on right groin and hemoglobin dropped to 6.9.  CT showing pseudoaneurysm right femoral artery.  On 8/20 vascular surgery performed surgery to explore pseudoaneurysm and repair of bovine pericardial patch angioplasty.  Wound VAC in place.  On 8/23, vascular surgery performed revision of right femoral retroperitoneal right iliac artery exposure, redo right femoral artery exposure, and endarterectomy and placement of multiple budgeted sutures for hemostasis.  Right femoral Prevena wound VAC postop cultures show Pseudomonas aeruginosa.  Started on cefepime.  On 8/26, acute bleed from right groin with VAC output of greater than 500 mL.  Performed right groin exploration and evacuation of right groin hematoma.  On 8/27, patient brought back to the OR for reexploration of right groin, ligation of right femoropopliteal vein bypass, ligation of right external iliac artery, excision of right bovine pericardial patch with excision of right common femoral artery, and negative pressure drain to right groin.  Patient remains intubated postprocedure due to unable to wean off ventilator.  PCCM consulted for ventilator management.   Pertinent  Medical History   Past Medical History:  Diagnosis Date   Diabetes mellitus without  complication (HCC)    Hypertension    Significant Hospital Events: Including procedures, antibiotic start and stop dates in addition to other pertinent events   8/27: right groin re-exploration and evacuation of hematoma, very friable vessels, ultimately opted for ligation.  8/28 more comfortable. No signs of bleeding.   Interim History / Subjective:  Awakes with agitation.   Objective   Blood pressure 128/76, pulse (!) 109, temperature (!) 100.5 F (38.1 C), temperature source Axillary, resp. rate (!) 35, height 4\' 5"  (1.346 m), weight 60.2 kg, SpO2 100 %.    Vent Mode: PRVC FiO2 (%):  [40 %] 40 % Set Rate:  [22 bmp] 22 bmp Vt Set:  [500 mL] 500 mL PEEP:  [5 cmH20] 5 cmH20 Plateau Pressure:  [12 cmH20-17 cmH20] 15 cmH20   Intake/Output Summary (Last 24 hours) at 07/05/2021 1313 Last data filed at 07/05/2021 1300 Gross per 24 hour  Intake 1642.33 ml  Output 635 ml  Net 1007.33 ml    Filed Weights   07/04/21 0600 07/05/21 0550  Weight: 57.5 kg 60.2 kg    Examination: General:  critically ill appearing on mech vent HEENT: MM pink/moist; ETT in place Neuro:  moves purposefully. CV: s1s2, sinus tach, no m/r/g,  PULM: accessory muscle use. Chest clear.   GI: soft, bsx4 active  Extremities: warm/dry,  BLE BKA - serosanguinous fluid in JP  Skin: no rashes or lesions    Resolved Hospital Problem list     Assessment & Plan:  Critically ill due tod Acute respiratory failure with hypoxia s/p reexploration of right groin S/P ligation of right femoropopliteal vein bypass  ligation of right external iliac artery, excision of right bovine pericardial patch with excision of right common femoral artery negative pressure drain to right groin Pseudomonas aeruginosa Right femoral Prevena wound  ABLA T2DM - adequate controlled.  Hx of HTN  Plan:  - Wean sedation and attempt SBT and extubation today.  - Continue cefepime for extended course especially given presence of graft  material - recommend 4-6 weeks of treatment. Will place PICC - resume oral medications once extubated - very high nutritional risk - will start tube feeds at trickle and increase if not extubated tomorrow. Might benefit from enteral feeding if not able to take in adequate calories.   Best Practice (right click and "Reselect all SmartList Selections" daily)   Diet/type: NPO w/ meds via tube DVT prophylaxis: other will hold off initially post procedure GI prophylaxis: PPI Lines: N/A Foley:  Yes, and it is still needed Code Status:  full code Last date of multidisciplinary goals of care discussion [pending]  Labs   CBC: Recent Labs  Lab 06/30/21 0127 06/30/21 1023 07/01/21 0426 07/02/21 0222 07/03/21 0249 07/04/21 0032 07/04/21 0102 07/04/21 0913 07/04/21 1406 07/04/21 2010 07/05/21 0323 07/05/21 0806  WBC 22.5*  --  23.5* 24.9* 14.7*  --  31.9*  --   --   --   --   --   HGB 7.9*   < > 8.0* 7.9* 7.4*   < > 11.3* 12.9* 9.5* 9.7* 9.1* 9.4*  HCT 23.5*   < > 23.5* 23.8* 22.7*   < > 33.2* 38.0* 28.2* 28.5* 27.6* 28.4*  MCV 83.6  --  84.5 85.6 86.0  --  86.0  --   --   --   --   --   PLT 382  --  330 352 304  --  298  --   --   --   --   --    < > = values in this interval not displayed.     Basic Metabolic Panel: Recent Labs  Lab 06/30/21 0127 06/30/21 1023 07/03/21 0249 07/04/21 0032 07/04/21 0554 07/04/21 0629 07/04/21 0839 07/04/21 0913 07/04/21 1301 07/04/21 2010 07/05/21 0323  NA 131*   < > 135   < > 139 137 138 138  --   --  138  K 3.7   < > 3.1*   < > 4.0 4.1 3.8 4.9  --   --  3.6  CL 101  --  106  --   --  109 111  --   --   --  113*  CO2 23  --  22  --   --  22 19*  --   --   --  21*  GLUCOSE 96  --  131*  --   --  242* 226*  --   --   --  126*  BUN 22  --  14  --   --  14 16  --   --   --  16  CREATININE 0.72  --  0.54*  --   --  0.59* 0.61  --   --   --  0.61  CALCIUM 8.9  --  8.1*  --   --  7.6* 7.2*  --   --   --  7.9*  MG  --   --   --   --   --   --    --   --  1.5* 2.0 2.0  PHOS  --   --   --   --   --   --   --   --  2.8 2.4* 1.9*   < > = values in this interval not displayed.    GFR: Estimated Creatinine Clearance: 51.6 mL/min (by C-G formula based on SCr of 0.61 mg/dL). Recent Labs  Lab 07/01/21 0426 07/02/21 0222 07/02/21 1553 07/02/21 1821 07/03/21 0249 07/04/21 0629  WBC 23.5* 24.9*  --   --  14.7* 31.9*  LATICACIDVEN  --   --  0.9 0.7  --   --      Liver Function Tests: Recent Labs  Lab 07/04/21 0629  AST 19  ALT 15  ALKPHOS 122  BILITOT 1.9*  PROT 5.2*  ALBUMIN 1.7*    No results for input(s): LIPASE, AMYLASE in the last 168 hours. No results for input(s): AMMONIA in the last 168 hours.  ABG    Component Value Date/Time   PHART 7.359 07/04/2021 0913   PCO2ART 36.8 07/04/2021 0913   PO2ART 59 (L) 07/04/2021 0913   HCO3 20.8 07/04/2021 0913   TCO2 22 07/04/2021 0913   ACIDBASEDEF 4.0 (H) 07/04/2021 0913   O2SAT 90.0 07/04/2021 0913      Coagulation Profile: Recent Labs  Lab 06/30/21 1608  INR 1.2     Cardiac Enzymes: No results for input(s): CKTOTAL, CKMB, CKMBINDEX, TROPONINI in the last 168 hours.  HbA1C: Hgb A1c MFr Bld  Date/Time Value Ref Range Status  05/13/2021 02:18 AM 5.2 4.8 - 5.6 % Final    Comment:    (NOTE) Pre diabetes:          5.7%-6.4%  Diabetes:              >6.4%  Glycemic control for   <7.0% adults with diabetes     CBG: Recent Labs  Lab 07/04/21 1948 07/04/21 2340 07/05/21 0330 07/05/21 0759 07/05/21 1142  GLUCAP 108* 132* 114* 164* 170*    CRITICAL CARE Performed by: Lynnell Catalan   Total critical care time: 40 minutes  Critical care time was exclusive of separately billable procedures and treating other patients.  Critical care was necessary to treat or prevent imminent or life-threatening deterioration.  Critical care was time spent personally by me on the following activities: development of treatment plan with patient and/or surrogate  as well as nursing, discussions with consultants, evaluation of patient's response to treatment, examination of patient, obtaining history from patient or surrogate, ordering and performing treatments and interventions, ordering and review of laboratory studies, ordering and review of radiographic studies, pulse oximetry, re-evaluation of patient's condition and participation in multidisciplinary rounds.  Lynnell Catalan, MD [facility] ICU Physician [facility]  Pager: [phone] Mobile: [phone] After hours: [phone].

## 2021-07-05 NOTE — Progress Notes (Signed)
Peripherally Inserted Central Catheter Placement  The IV Nurse has discussed with the patient and/or persons authorized to consent for the patient, the purpose of this procedure and the potential benefits and risks involved with this procedure.  The benefits include less needle sticks, lab draws from the catheter, and the patient may be discharged home with the catheter. Risks include, but not limited to, infection, bleeding, blood clot (thrombus formation), and puncture of an artery; nerve damage and irregular heartbeat and possibility to perform a PICC exchange if needed/ordered by physician.  Alternatives to this procedure were also discussed.  Bard Power PICC patient education guide, fact sheet on infection prevention and patient information card has been provided to patient /or left at bedside.    PICC Placement Documentation  PICC Double Lumen 07/05/21 PICC Right Basilic 40 cm 0 cm (Active)  Indication for Insertion or Continuance of Line Prolonged intravenous therapies 07/05/21 0928  Exposed Catheter (cm) 0 cm 07/05/21 5830  Site Assessment Clean;Dry;Intact 07/05/21 0928  Lumen #1 Status Flushed;Saline locked;Blood return noted 07/05/21 0928  Lumen #2 Status Blood return noted;Saline locked;Flushed 07/05/21 0928  Dressing Type Transparent;Securing device 07/05/21 9407  Dressing Status Clean;Dry;Intact 07/05/21 0928  Antimicrobial disc in place? Yes 07/05/21 0928  Safety Lock Not Applicable 07/05/21 0928  Dressing Change Due 07/12/21 07/05/21 0928       Romie Jumper 07/05/2021, 9:29 AM

## 2021-07-05 NOTE — Progress Notes (Signed)
eLink Physician-Brief Progress Note Patient Name: DAEMIEN FRONCZAK DOB: [birth year] MRN: [id number]   Date of Service  07/05/2021  HPI/Events of Note  K 3.6, Po4 1.9 Cr normal.   eICU Interventions  Kphos 20 mmols IV ordered     Intervention Category Minor Interventions: Electrolytes abnormality - evaluation and management  Ranee Gosselin 07/05/2021, 6:48 AM

## 2021-07-05 NOTE — Plan of Care (Signed)
  Problem: Clinical Measurements: Goal: Ability to maintain clinical measurements within normal limits will improve Outcome: Progressing   Problem: Nutrition: Goal: Adequate nutrition will be maintained Outcome: Progressing   Problem: Coping: Goal: Level of anxiety will decrease Outcome: Not Progressing   Problem: Pain Managment: Goal: General experience of comfort will improve Outcome: Progressing   Problem: Safety: Goal: Ability to remain free from injury will improve Outcome: Progressing   Problem: Skin Integrity: Goal: Risk for impaired skin integrity will decrease Outcome: Progressing   Problem: Skin Integrity: Goal: Demonstration of wound healing without infection will improve Outcome: Progressing   Problem: Safety: Goal: Non-violent Restraint(s) Outcome: Completed/Met   Problem: Activity: Goal: Ability to tolerate increased activity will improve Outcome: Progressing   Problem: Respiratory: Goal: Ability to maintain a clear airway and adequate ventilation will improve Outcome: Progressing   Problem: Role Relationship: Goal: Method of communication will improve Outcome: Not Progressing

## 2021-07-05 NOTE — Progress Notes (Signed)
Patient attempted self extubation. Dr. Denese Killings notified, non-violent restraint protocol started.

## 2021-07-05 NOTE — Progress Notes (Addendum)
eLink Physician-Brief Progress Note Patient Name: SAHITH NURSE DOB: [birth year] MRN: [id number]   Date of Service  07/05/2021  HPI/Events of Note  Camera: S/p extubated earlier, now in respiratory distress, alert and awake. RR > 30. On NRB.RT getting BiPAP. CCM bed side team coming to room. Sats 99%, SBP > 150 Cough+. HR 150 to 160-Sinus tachy to SVT. EKG: stat: ST changes suggestive of ischemia.( ST depressed infero laterally, LVH). 27 th: T down V4 to V6. Qtc 450.  8/27 re exploration and evacuation of hematoma of rt groin. PAD Psuedomonal infection .  eICU Interventions  - BiPAP, asp precautions. Low thresh hold for intubation if not tolerating Might need precedex gtt -follow troponin levels. - follow EKG if tachy arythmia not better. - get BMP, Mag, Hg .       Intervention Category Major Interventions: Respiratory failure - evaluation and management  Ranee Gosselin 07/05/2021, 8:33 PM  20:48 On BiPAP. HR 165. MAP good.  Started precedex gtt. Asp precautions.  22:10 Camera: follow Up; HR 112, on precedex, calm now. RR < 16. Sats 98% on BiPAP. Tolerating  Labs: Hg up 9.6 CxR, troponin mag BMP pending still.   22:20 Trop 126, trend it Mag 1.8- 1gm Mag sulfite ordering. Hyponatremia. Follow AM lab. Cr normal. K is ok.   00:57 CxR film seen. Stable bilateral basal air space densities. Troponin not doubling, now at 192. Keep trending.   05;41 Troponin 262, flattening, not doubling, but lateral ST ischemic changes. Mostly type 2 demand ischemic changes.  - trend once more troponin level. Will not start any ACS/NSTEMI protocol yet.unless going up. Anemia, risk for bleeding.  - Cardiology input and ECHO in AM.

## 2021-07-06 ENCOUNTER — Encounter (HOSPITAL_COMMUNITY): Payer: Self-pay | Admitting: Vascular Surgery

## 2021-07-06 ENCOUNTER — Other Ambulatory Visit: Payer: Self-pay

## 2021-07-06 ENCOUNTER — Inpatient Hospital Stay (HOSPITAL_COMMUNITY): Payer: No Typology Code available for payment source

## 2021-07-06 DIAGNOSIS — I10 Essential (primary) hypertension: Secondary | ICD-10-CM

## 2021-07-06 DIAGNOSIS — R0602 Shortness of breath: Secondary | ICD-10-CM

## 2021-07-06 DIAGNOSIS — J9601 Acute respiratory failure with hypoxia: Secondary | ICD-10-CM | POA: Diagnosis not present

## 2021-07-06 DIAGNOSIS — E440 Moderate protein-calorie malnutrition: Secondary | ICD-10-CM | POA: Insufficient documentation

## 2021-07-06 DIAGNOSIS — J9602 Acute respiratory failure with hypercapnia: Secondary | ICD-10-CM | POA: Diagnosis not present

## 2021-07-06 LAB — POCT I-STAT 7, (LYTES, BLD GAS, ICA,H+H)
Acid-base deficit: 2 mmol/L (ref 0.0–2.0)
Bicarbonate: 22.3 mmol/L (ref 20.0–28.0)
Calcium, Ion: 1.21 mmol/L (ref 1.15–1.40)
HCT: 23 % — ABNORMAL LOW (ref 39.0–52.0)
Hemoglobin: 7.8 g/dL — ABNORMAL LOW (ref 13.0–17.0)
O2 Saturation: 100 %
Patient temperature: 98.6
Potassium: 3.9 mmol/L (ref 3.5–5.1)
Sodium: 139 mmol/L (ref 135–145)
TCO2: 23 mmol/L (ref 22–32)
pCO2 arterial: 35 mmHg (ref 32.0–48.0)
pH, Arterial: 7.413 (ref 7.350–7.450)
pO2, Arterial: 182 mmHg — ABNORMAL HIGH (ref 83.0–108.0)

## 2021-07-06 LAB — HEMOGLOBIN AND HEMATOCRIT, BLOOD
HCT: 27.4 % — ABNORMAL LOW (ref 39.0–52.0)
HCT: 27.9 % — ABNORMAL LOW (ref 39.0–52.0)
Hemoglobin: 8.8 g/dL — ABNORMAL LOW (ref 13.0–17.0)
Hemoglobin: 8.8 g/dL — ABNORMAL LOW (ref 13.0–17.0)

## 2021-07-06 LAB — TROPONIN I (HIGH SENSITIVITY)
Troponin I (High Sensitivity): 265 ng/L
Troponin I (High Sensitivity): 265 ng/L (ref <18)

## 2021-07-06 LAB — ECHOCARDIOGRAM COMPLETE
AR max vel: 4.46 cm2
AV Area VTI: 4.57 cm2
AV Area mean vel: 3.96 cm2
AV Mean grad: 2 mmHg
AV Peak grad: 2.6 mmHg
Ao pk vel: 0.8 m/s
Area-P 1/2: 4.49 cm2
Height: 53 in
S' Lateral: 3.3 cm
Single Plane A4C EF: 36.8 %
Weight: 2052.92 oz

## 2021-07-06 LAB — GLUCOSE, CAPILLARY
Glucose-Capillary: 105 mg/dL — ABNORMAL HIGH (ref 70–99)
Glucose-Capillary: 122 mg/dL — ABNORMAL HIGH (ref 70–99)
Glucose-Capillary: 123 mg/dL — ABNORMAL HIGH (ref 70–99)
Glucose-Capillary: 153 mg/dL — ABNORMAL HIGH (ref 70–99)
Glucose-Capillary: 183 mg/dL — ABNORMAL HIGH (ref 70–99)
Glucose-Capillary: 64 mg/dL — ABNORMAL LOW (ref 70–99)
Glucose-Capillary: 72 mg/dL (ref 70–99)

## 2021-07-06 LAB — LACTIC ACID, PLASMA: Lactic Acid, Venous: 3.4 mmol/L (ref 0.5–1.9)

## 2021-07-06 MED ORDER — PERFLUTREN LIPID MICROSPHERE
1.0000 mL | INTRAVENOUS | Status: AC | PRN
  Administered 2021-07-06: 2 mL via INTRAVENOUS
  Filled 2021-07-06: qty 10

## 2021-07-06 MED ORDER — ENSURE ENLIVE PO LIQD
237.0000 mL | Freq: Two times a day (BID) | ORAL | Status: DC
  Administered 2021-07-06 – 2021-07-14 (×15): 237 mL via ORAL

## 2021-07-06 MED ORDER — AMLODIPINE BESYLATE 10 MG PO TABS
10.0000 mg | ORAL_TABLET | Freq: Every day | ORAL | Status: DC
  Administered 2021-07-06: 10 mg via ORAL
  Filled 2021-07-06: qty 1

## 2021-07-06 MED ORDER — FUROSEMIDE 10 MG/ML IJ SOLN
40.0000 mg | Freq: Once | INTRAMUSCULAR | Status: AC
  Administered 2021-07-06: 40 mg via INTRAVENOUS
  Filled 2021-07-06: qty 4

## 2021-07-06 MED ORDER — ATENOLOL 25 MG PO TABS
25.0000 mg | ORAL_TABLET | Freq: Two times a day (BID) | ORAL | Status: DC
  Administered 2021-07-06 (×2): 25 mg via ORAL
  Filled 2021-07-06 (×2): qty 1

## 2021-07-06 MED ORDER — ADULT MULTIVITAMIN W/MINERALS CH
1.0000 | ORAL_TABLET | Freq: Every day | ORAL | Status: DC
  Administered 2021-07-07 – 2021-07-17 (×10): 1 via ORAL
  Filled 2021-07-06 (×10): qty 1

## 2021-07-06 MED ORDER — MAGNESIUM SULFATE 2 GM/50ML IV SOLN
2.0000 g | Freq: Once | INTRAVENOUS | Status: AC
  Administered 2021-07-06: 2 g via INTRAVENOUS
  Filled 2021-07-06: qty 50

## 2021-07-06 MED ORDER — SENNOSIDES 8.8 MG/5ML PO SYRP: 5.0000 mL | ORAL_SOLUTION | Freq: Every evening | ORAL | Status: DC | PRN

## 2021-07-06 MED ORDER — GABAPENTIN 100 MG PO CAPS
100.0000 mg | ORAL_CAPSULE | Freq: Three times a day (TID) | ORAL | Status: DC
  Administered 2021-07-06 – 2021-07-25 (×58): 100 mg via ORAL
  Filled 2021-07-06 (×57): qty 1

## 2021-07-06 MED ORDER — TRAMADOL HCL 50 MG PO TABS
50.0000 mg | ORAL_TABLET | Freq: Four times a day (QID) | ORAL | Status: DC | PRN
  Administered 2021-07-06 – 2021-07-07 (×3): 50 mg via ORAL
  Filled 2021-07-06 (×3): qty 1

## 2021-07-06 MED ORDER — DOCUSATE SODIUM 100 MG PO CAPS
100.0000 mg | ORAL_CAPSULE | Freq: Two times a day (BID) | ORAL | Status: DC
  Administered 2021-07-06 – 2021-07-17 (×21): 100 mg via ORAL
  Filled 2021-07-06 (×21): qty 1

## 2021-07-06 MED ORDER — PANTOPRAZOLE SODIUM 40 MG PO TBEC
40.0000 mg | DELAYED_RELEASE_TABLET | Freq: Every day | ORAL | Status: DC
  Administered 2021-07-07 – 2021-07-17 (×10): 40 mg via ORAL
  Filled 2021-07-06 (×10): qty 1

## 2021-07-06 MED ORDER — FUROSEMIDE 40 MG PO TABS
40.0000 mg | ORAL_TABLET | Freq: Every day | ORAL | Status: DC
  Administered 2021-07-07: 40 mg via ORAL
  Filled 2021-07-06: qty 1

## 2021-07-06 NOTE — Progress Notes (Signed)
Hypoglycemic Event  CBG: 64   Treatment: 4 oz juice/soda  Symptoms: None  Follow-up CBG: Time:1240 CBG Result:105  Possible Reasons for Event: Inadequate meal intake  Comments/MD notified:Will continue to monitor     Ermalene Searing

## 2021-07-06 NOTE — Progress Notes (Signed)
OT Cancellation Note  Patient Details Name: Todd Duran MRN: [id number] DOB: Feb 14, 1946   Cancelled Treatment:    Reason Eval/Treat Not Completed: Patient not medically ready. Will check back as able and appropriate.    Todd Duran, OT Acute Rehabilitation Services Pager [phone] Office [phone]   Todd Duran 07/06/2021, 8:32 AM

## 2021-07-06 NOTE — Addendum Note (Signed)
Addendum  created 07/06/21 1048 by Adair Laundry, CRNA   Order list changed, Pharmacy for encounter modified

## 2021-07-06 NOTE — Progress Notes (Signed)
Inpatient Rehabilitation Admissions Coordinator   I met with patient at bedside and then spoke with wife by phone. Asked her to begin discussions with patient and family on what care they could provide him at home. Noting that he has medically been very fragile over the past couple of weeks. Wife acknowledges that she will speak to his sisters and be at hospital tomorrow to speak with physician for update on his needs. I will follow up tomorrow.  Danne Baxter, RN, MSN Rehab Admissions Coordinator [phone] 07/06/2021 2:59 PM

## 2021-07-06 NOTE — Progress Notes (Signed)
Physical Therapy Treatment Patient Details Name: Todd Duran MRN: [id number] DOB: 1946-09-15 Today's Date: 07/06/2021    History of Present Illness Pt is a 75 yo male admitted on 7/29 from CIR due to R lower limb ischemia. Pt underwent R femoral endarterectomy, R iliac & superficial fem art stent, R 5th toe amp. 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 Transferred to Rehab on 8/16.  Back to Acute on 8/20 due to pseudoaneurysm with exploration. Continued active bleeding on 8/21. Wound vac placed 8/21 PM. On 8/27 pt had extensive bleeding from rt groin and taken to OR emergently x 2.  On second surgery pt underwent ligation of external iliac artery and right leg bypass and excised the infected patch in the right common femoral artery. Post op pt remained intubated until extubated 8/28. Pt on bipap until 8/29.  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    PT Comments    The pt was seen for continued progression of mobility and strengthening. The pt was able to follow cues for bed mobility and required min-modA to manage movement of RLE and trunk to come to sitting EOB. Once in stable sitting position, the pt was able to maintain with use of BUE and no assist or single UE support and close guard for short bouts. The pt was unable to complete any lateral scooting at this time, will continue to benefit from skilled PT to progress functional strength in BUE and core to allow for improved independence and mobility.    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), R BKA (06/21/2021), confusion, R jp drain Required Braces or Orthoses: Other Brace Other Brace: he has limb  protector in room but not donned as pt unable to transfer OOB this date Restrictions Weight Bearing Restrictions: Yes RLE Weight Bearing: Non weight bearing LLE Weight Bearing: Non weight bearing    Mobility  Bed Mobility Overal bed mobility: Needs Assistance Bed Mobility: Rolling;Supine to Sit;Sit to Supine Rolling: Min assist   Supine to sit: Mod assist;+2 for safety/equipment;HOB elevated Sit to supine: Mod assist;+2 for safety/equipment   General bed mobility comments: pt requires increased time and cueing for technique, overall mod assist for bed mobility for LB and trunk support; rolling in bed with min assist    Transfers                 General transfer comment: deferred        Balance Overall balance assessment: Needs assistance Sitting-balance support: Feet unsupported;Bilateral upper extremity supported;No upper extremity supported Sitting balance-Leahy Scale: Poor Sitting balance - Comments: with BUE support min guard, dynamically up to mod assist; lateral leans with mod assist to reposition back to midline Postural control: Posterior lean                                  Cognition Arousal/Alertness: Lethargic Behavior During Therapy: Flat affect Overall Cognitive Status: No family/caregiver present to determine baseline cognitive functioning Area of Impairment: Problem solving;Awareness;Following commands  Following Commands: Follows one step commands consistently;Follows one step commands with increased time;Follows multi-step commands inconsistently Safety/Judgement: Decreased awareness of safety;Decreased awareness of deficits Awareness: Emergent Problem Solving: Slow processing;Decreased initiation;Difficulty sequencing;Requires verbal cues;Requires tactile cues General Comments: pt with delayed processing and decreased problem sovling, requires incresaed time and verbal cueing      Exercises Other  Exercises Other Exercises: lateral leaning to each side with attempt to offweight at hips with lean. pt needing assist to push back to sitting midline    General Comments General comments (skin integrity, edema, etc.): VSS on 4L supplemental O2 via [place]      Pertinent Vitals/Pain Pain Assessment: Faces Faces Pain Scale: Hurts little more Pain Location: buttocks, R LE Pain Descriptors / Indicators: Grimacing;Guarding;Discomfort Pain Intervention(s): Limited activity within patient's tolerance;Monitored during session;Repositioned     PT Goals (current goals can now be found in the care plan section) Acute Rehab PT Goals Patient Stated Goal: none stated PT Goal Formulation: With patient Time For Goal Achievement: 07/12/21 Potential to Achieve Goals: Fair Progress towards PT goals: Progressing toward goals    Frequency    Min 3X/week      PT Plan Current plan remains appropriate    Co-evaluation PT/OT/SLP Co-Evaluation/Treatment: Yes Reason for Co-Treatment: For patient/therapist safety;To address functional/ADL transfers PT goals addressed during session: Mobility/safety with mobility;Balance OT goals addressed during session: ADL's and self-care      AM-PAC PT "6 Clicks" Mobility   Outcome Measure  Help needed turning from your back to your side while in a flat bed without using bedrails?: A Lot Help needed moving from lying on your back to sitting on the side of a flat bed without using bedrails?: A Lot Help needed moving to and from a bed to a chair (including a wheelchair)?: Total Help needed standing up from a chair using your arms (e.g., wheelchair or bedside chair)?: Total Help needed to walk in hospital room?: Total Help needed climbing 3-5 steps with a railing? : Total 6 Click Score: 8    End of Session   Activity Tolerance: Patient tolerated treatment well;Patient limited by fatigue Patient left: in bed;with bed alarm set;with call bell/phone within  reach Nurse Communication: Mobility status PT Visit Diagnosis: Other abnormalities of gait and mobility (R26.89);Pain Pain - Right/Left: Right Pain - part of body: Hip;Leg     Time: 7673-4193 PT Time Calculation (min) (ACUTE ONLY): 24 min  Charges:  $Therapeutic Activity: 8-22 mins                     Vickki Muff, PT, DPT   Acute Rehabilitation Department Pager #: [phone]   Ronnie Derby 07/06/2021, 12:57 PM

## 2021-07-06 NOTE — Progress Notes (Signed)
Inpatient Rehabilitation Admissions Coordinator   I continue to follow his progress at a distance. Noted palliative consulted.  Ottie Glazier, RN, MSN Rehab Admissions Coordinator [phone] 07/06/2021 12:58 PM

## 2021-07-06 NOTE — Progress Notes (Addendum)
  Progress Note    07/06/2021 7:51 AM 2 Days Post-Op  Subjective:  extubated yesterday.  Had some hypotension overnight.  Still on BiPAP complaining of R knee pain   Vitals:   07/06/21 0700 07/06/21 0733  BP: 113/62   Pulse: 73 74  Resp: 16 15  Temp:  98.1 F (36.7 C)  SpO2: 100% 100%   Physical Exam: Lungs:  non labored Incisions:  R groin without hematoma Extremities:  cold and dusky skin R knee and below; L AKA well healed Neurologic: a&O  CBC    Component Value Date/Time   WBC 31.9 (H) 07/04/2021 0629   RBC 3.86 (L) 07/04/2021 0629   HGB 8.8 (L) 07/06/2021 0202   HCT 27.4 (L) 07/06/2021 0202   PLT 298 07/04/2021 0629   MCV 86.0 07/04/2021 0629   MCH 29.3 07/04/2021 0629   MCHC 34.0 07/04/2021 0629   RDW 15.5 07/04/2021 0629   LYMPHSABS 1.9 06/24/2021 0518   MONOABS 0.6 06/24/2021 0518   EOSABS 0.2 06/24/2021 0518   BASOSABS 0.1 06/24/2021 0518    BMET    Component Value Date/Time   NA 139 07/06/2021 0001   K 3.9 07/06/2021 0001   CL 110 07/05/2021 2052   CO2 20 (L) 07/05/2021 2052   GLUCOSE 190 (H) 07/05/2021 2052   BUN 17 07/05/2021 2052   CREATININE 0.61 07/05/2021 2052   CALCIUM 7.9 (L) 07/05/2021 2052   GFRNONAA >60 07/05/2021 2052    INR    Component Value Date/Time   INR 1.2 06/30/2021 1608     Intake/Output Summary (Last 24 hours) at 07/06/2021 0751 Last data filed at 07/06/2021 0741 Gross per 24 hour  Intake 1442.3 ml  Output 1990 ml  Net -547.7 ml     Assessment/Plan:  75 y.o. male is s/p ligation of R EIA 2 Days Post-Op   Continued bleeding from R groin requiring ligation of R EIA artery over the weekend R BKA stump is cold with darkening skin; temperature change around distal thigh Will consult palliative care   Emilie Rutter, PA-C [facility] [phone] 07/06/2021 7:51 AM   I agree with the above.  I have seen and evaluated the patient.  I am worried about the BKA stump as it is very cool  compared to the thigh.  I will monitor this closely.  Will consult palliative   [name]

## 2021-07-06 NOTE — Progress Notes (Signed)
PT Cancellation Note  Patient Details Name: ALFRED HARREL MRN: [id number] DOB: 21-Apr-1946   Cancelled Treatment:    Reason Eval/Treat Not Completed: Medical issues which prohibited therapy. RN asking PT/OT to hold for the morning due to respiratory issues overnight. Will continue to follow and check back as able and appropriate.   Vickki Muff, PT, DPT   Acute Rehabilitation Department Pager #: [phone]   Ronnie Derby 07/06/2021, 8:48 AM

## 2021-07-06 NOTE — Progress Notes (Addendum)
Occupational Therapy Treatment Patient Details Name: Todd Duran MRN: [id number] DOB: [birth year] Today's Date: 07/06/2021    History of present illness Pt is a 75 yo male admitted on 7/29 from CIR due to R lower limb ischemia. Pt underwent R femoral endarterectomy, R iliac & superficial fem art stent, R 5th toe amp. 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 Transferred to Rehab on 8/16.  Back to Acute on 8/20 due to pseudoaneurysm with exploration. Continued active bleeding on 8/21. Wound vac placed 8/21 PM. On 8/27 pt had extensive bleeding from rt groin and taken to OR emergently x 2.  On second surgery pt underwent ligation of external iliac artery and right leg bypass and excised the infected patch in the right common femoral artery. Post op pt remained intubated until extubated 8/28. Pt on bipap until 8/29.  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   OT comments  Patient supine in bed and agreeable to OT/PT session.  Patient with complicated R LE mgmt (as above), but progressing well.  Requires min assist for rolling in bed, mod assist +2 to sit EOB and maintains static sittng EOB with min guard.  Dynamically requires up to mod assist for balance during simulated LB ADLs (total assist) and bed pad placement. He is disoriented to month but able to report year, follows simple commands but requires greatly increased time (question HOH). Cueing required for problem solving throughout session. Remains limited by cognition, balance, weakness and pain. DC plan remains appropriate at CIR level to progress strength and balance for transfers to decrease burden of care .  Will follow acutely.    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), confusion, R jp drain Required Braces or Orthoses: Other Brace Other Brace: he has limb protector in room but not donned as pt unable to transfer OOB this date Restrictions Weight Bearing Restrictions: Yes RLE Weight Bearing: Non weight bearing LLE Weight Bearing: Non weight bearing       Mobility Bed Mobility Overal bed mobility: Needs Assistance Bed Mobility: Rolling;Supine to Sit;Sit to Supine Rolling: Min assist   Supine to sit: Mod assist;+2 for safety/equipment;HOB elevated Sit to supine: Mod assist;+2 for safety/equipment   General bed mobility comments: pt requires increased time and cueing for technique, overall mod assist for bed mobility for LB and trunk support; rolling in bed with min assist    Transfers                 General transfer comment: deferred    Balance Overall balance assessment: Needs assistance Sitting-balance support: Feet unsupported;Bilateral upper extremity supported;No upper extremity supported Sitting balance-Leahy Scale: Poor Sitting balance - Comments: with BUE support min guard, dynamically up to mod assist; lateral leans with mod assist to reposition back to midline                                   ADL either performed or assessed with clinical judgement   ADL Overall ADL's : Needs assistance/impaired     Grooming: Set up;Bed level  Lower Body Dressing: Total assistance;Sitting/lateral leans     Toilet Transfer Details (indicate cue type and reason): deferred         Functional mobility during ADLs: Moderate assistance;+2 for safety/equipment;Cueing for safety;Cueing for sequencing       Vision       Perception     Praxis      Cognition Arousal/Alertness: Lethargic Behavior During Therapy: Flat affect Overall Cognitive Status: No family/caregiver present to determine  baseline cognitive functioning Area of Impairment: Problem solving;Awareness;Following commands                       Following Commands: Follows one step commands consistently;Follows one step commands with increased time;Follows multi-step commands inconsistently   Awareness: Emergent Problem Solving: Slow processing;Decreased initiation;Difficulty sequencing;Requires verbal cues;Requires tactile cues General Comments: pt with delayed processing and decreased problem sovling, requires incresaed time and verbal cueing        Exercises     Shoulder Instructions       General Comments VSS on 4L supplemental O2 via [place]    Pertinent Vitals/ Pain       Pain Assessment: Faces Faces Pain Scale: Hurts little more Pain Location: buttocks, R LE Pain Descriptors / Indicators: Grimacing;Guarding;Discomfort Pain Intervention(s): Limited activity within patient's tolerance;Monitored during session;Repositioned  Home Living                                          Prior Functioning/Environment              Frequency  Min 2X/week        Progress Toward Goals  OT Goals(current goals can now be found in the care plan section)  Progress towards OT goals: Progressing toward goals  Acute Rehab OT Goals Patient Stated Goal: none stated  Plan Discharge plan remains appropriate;Frequency remains appropriate    Co-evaluation    PT/OT/SLP Co-Evaluation/Treatment: Yes Reason for Co-Treatment: To address functional/ADL transfers;For patient/therapist safety;Complexity of the patient's impairments (multi-system involvement)   OT goals addressed during session: ADL's and self-care      AM-PAC OT "6 Clicks" Daily Activity     Outcome Measure   Help from another person eating meals?: A Little Help from another person taking care of personal grooming?: A Little Help from another person toileting, which includes using toliet, bedpan, or urinal?: Total Help  from another person bathing (including washing, rinsing, drying)?: A Lot Help from another person to put on and taking off regular upper body clothing?: A Little Help from another person to put on and taking off regular lower body clothing?: Total 6 Click Score: 13    End of Session Equipment Utilized During Treatment: Oxygen  OT Visit Diagnosis: Other abnormalities of gait and mobility (R26.89);Muscle weakness (generalized) (M62.81);Pain;Other symptoms and signs involving cognitive function Pain - Right/Left: Right Pain - part of body: Leg   Activity Tolerance Patient tolerated treatment well   Patient Left in bed;with call bell/phone within reach;with bed alarm set   Nurse Communication Mobility status        Time: 9470-9628 OT Time Calculation (min): 28 min  Charges: OT General Charges $OT Visit: 1 Visit OT Treatments $Self Care/Home Management : 8-22 mins  Barry Brunner, OT Acute Rehabilitation Services Pager [phone] Office [phone]    Chancy Milroy 07/06/2021, 12:17 PM

## 2021-07-06 NOTE — Progress Notes (Signed)
Date and time results received: 07/06/21 1:30 PM   Test: plasma lactic Critical Value: 3.4   Name of Provider Notified: Anders Simmonds, NP  Orders Received? Or Actions Taken?:  provider notified

## 2021-07-06 NOTE — Progress Notes (Signed)
Inpatient Diabetes Program Recommendations  AACE/ADA: New Consensus Statement on Inpatient Glycemic Control (2015)  Target Ranges:  Prepandial:   less than 140 mg/dL      Peak postprandial:   less than 180 mg/dL (1-2 hours)      Critically ill patients:  140 - 180 mg/dL   Lab Results  Component Value Date   GLUCAP 105 (H) 07/06/2021   HGBA1C 5.2 05/13/2021    Review of Glycemic Control Results for TEJ, MURDAUGH (MRN [id number]) as of 07/06/2021 14:22  Ref. Range 07/05/2021 23:50 07/06/2021 03:34 07/06/2021 07:29 07/06/2021 12:17 07/06/2021 12:46  Glucose-Capillary Latest Ref Range: 70 - 99 mg/dL 132 (H) 440 (H) 102 (H) 64 (L) 105 (H)   Diabetes history: DM 2 Outpatient Diabetes medications:  Metformin 500 mg with breakfast Current orders for Inpatient glycemic control:  Novolog moderate q 4 hours  Inpatient Diabetes Program Recommendations:    Consider reducing Novolog correction to sensitive tid with meals and bedtime scale.   Thanks,  Beryl Meager, RN, BC-ADM Inpatient Diabetes Coordinator Pager [phone]  (8a-5p)

## 2021-07-06 NOTE — Progress Notes (Signed)
Nutrition Follow Up  DOCUMENTATION CODES:   Non-severe (moderate) malnutrition in context of chronic illness  INTERVENTION:   Liberalize diet to REGULAR  Ensure Enlive po BID, each supplement provides 350 kcal and 20 grams of protein MVI with minerals daily   NUTRITION DIAGNOSIS:   Moderate Malnutrition related to chronic illness (PVD/PAD) as evidenced by moderate fat depletion, severe muscle depletion.  Ongoing  GOAL:   Patient will meet greater than or equal to 90% of their needs  Diet just advanced   MONITOR:   PO intake, Supplement acceptance, Weight trends, Labs, I & O's  REASON FOR ASSESSMENT:   Consult Enteral/tube feeding initiation and management  ASSESSMENT:   Patient with PMH significant for PAD, L BKA, PVD, DM, and HTN. Presents this admission with large pseudoaneurysm of the R groin.  8/20- s/p retroperitoneal exposure of R iliac artery, exploration of R groin, repair of bovine pericardial patch angioplasty 8/23- s/p redo R femoral artery exposure, revision of R femoral endarterectomy, R femoral Wound VAC 8/27- s/p R groin exploration with common femoral repair, evacuation of R groin hematoma, reexploration later in the day 8/28- extubated   Off BiPAP. Noted BKA stump very cool compared to the thigh. Palliative consulted to help guide GOC.   Diet advanced to regular consistency this am. Liberalize to provide patient with more option given malnutrition status. Patient eager to have meal with "real food." Discussed the importance of protein intake to promote post op healing. Patient amendable to supplementation.   Admission weight: 57.5 kg  Current weight: 58.2 kg   UOP: 1600 ml x 24 hrs JP drain (groin): 40 ml x 24 hrs   Drips: LR @ 10 ml/hr  Medications: colace, 40 mg lasix daily, SS novolog, miralax Labs: CBG 64-123  Flowsheet Row Most Recent Value  Orbital Region Moderate depletion  Upper Arm Region Moderate depletion  Thoracic and Lumbar  Region Unable to assess  Buccal Region Mild depletion  Temple Region Severe depletion  Clavicle Bone Region Severe depletion  Clavicle and Acromion Bone Region Severe depletion  Scapular Bone Region Unable to assess  Dorsal Hand Moderate depletion  Patellar Region Unable to assess  Anterior Thigh Region Unable to assess  Posterior Calf Region Unable to assess  Edema (RD Assessment) Unable to assess  Hair Reviewed  Eyes Reviewed  Mouth Reviewed  Skin Reviewed  Nails Reviewed      Diet Order:   Diet Order             Diet heart healthy/carb modified Room service appropriate? Yes; Fluid consistency: Thin  Diet effective now                   EDUCATION NEEDS:   Education needs have been addressed  Skin:  Skin Assessment: Skin Integrity Issues: Skin Integrity Issues:: Stage II, Incisions Stage II: coccyx Incisions: multiple R groin with VAC  Last BM:  8/28  Height:   Ht Readings from Last 1 Encounters:  07/04/21 4\' 5"  (1.346 m)    Weight:   Wt Readings from Last 1 Encounters:  07/06/21 58.2 kg    BMI:  Body mass index is 32.11 kg/m.  Estimated Nutritional Needs:   Kcal:  1800-2000 kcal  Protein:  100-120 grams  Fluid:  >/= 1.8 L/day  07/08/21 MS, RD, LDN, CNSC Clinical Nutrition Pager listed in [facility]

## 2021-07-06 NOTE — Progress Notes (Signed)
NAME:  Todd Duran, MRN:  [id number], DOB:  12-23-1945, LOS: 9 ADMISSION DATE:  06/27/2021, CONSULTATION DATE:  8/27 REFERRING MD:  Dr. Myra Gianotti, CHIEF COMPLAINT:   post procedural intubation  History of Present Illness:  Patient is a 75 year old male with PMH of DM T2, HTN, bilateral BKA presents to [facility] on 8/20 with pseudoaneurysm of R femoral artery.  On 7/13 patient received left BKA.  Patient was brought in on 7/29 for right toe amputation.  Right femoral to popliteal artery bypass grafting was done on 8/3.  On 8/13, patient presents with infected right foot and on 8/14 a right BKA was performed.   On 8/16 patient was sent to CIR.  On 8/20 vascular surgery consulted for bleeding on right groin and hemoglobin dropped to 6.9.  CT showing pseudoaneurysm right femoral artery.  On 8/20 vascular surgery performed surgery to explore pseudoaneurysm and repair of bovine pericardial patch angioplasty.  Wound VAC in place.  On 8/23, vascular surgery performed revision of right femoral retroperitoneal right iliac artery exposure, redo right femoral artery exposure, and endarterectomy and placement of multiple budgeted sutures for hemostasis.  Right femoral Prevena wound VAC postop cultures show Pseudomonas aeruginosa.  Started on cefepime.  On 8/26, acute bleed from right groin with VAC output of greater than 500 mL.  Performed right groin exploration and evacuation of right groin hematoma.  On 8/27, patient brought back to the OR for reexploration of right groin, ligation of right femoropopliteal vein bypass, ligation of right external iliac artery, excision of right bovine pericardial patch with excision of right common femoral artery, and negative pressure drain to right groin.  Patient remains intubated postprocedure due to unable to wean off ventilator.  PCCM consulted for ventilator management.   Pertinent  Medical History   Past Medical History:  Diagnosis Date   Diabetes mellitus without  complication (HCC)    Hypertension    Significant Hospital Events: Including procedures, antibiotic start and stop dates in addition to other pertinent events   8/27: right groin re-exploration and evacuation of hematoma, very friable vessels, ultimately opted for ligation.  8/28 more comfortable. No signs of bleeding.  But ended up on BIPAP; 2/2 edema seemed to respond to edema  8/29 adding back home meds. Palliative consulted by primary team   Interim History / Subjective:  Awakes with agitation.   Objective   Blood pressure 113/62, pulse 74, temperature 98.1 F (36.7 C), temperature source Axillary, resp. rate 15, height 4\' 5"  (1.346 m), weight 58.2 kg, SpO2 100 %.    Vent Mode: PSV;CPAP FiO2 (%):  [40 %-50 %] 50 % Set Rate:  [22 bmp] 22 bmp Vt Set:  [500 mL] 500 mL PEEP:  [5 cmH20] 5 cmH20 Pressure Support:  [5 cmH20] 5 cmH20 Plateau Pressure:  [14 cmH20-15 cmH20] 15 cmH20   Intake/Output Summary (Last 24 hours) at 07/06/2021 0738 Last data filed at 07/06/2021 0600 Gross per 24 hour  Intake 1442.3 ml  Output 1640 ml  Net -197.7 ml   Filed Weights   07/04/21 0600 07/05/21 0550 07/06/21 0300  Weight: 57.5 kg 60.2 kg 58.2 kg    Examination: General this is a 75 year old male. He is resting in bed. I just removed him from BIPAP and he is in no distress HENT NCAT no JVD  Pulm clear no accessory use on 5 lpm Card RRR  Abd soft not tender Ext left AKA, right BKA staples intact. The leg is cool from knee  down  Neuro awake and oriented.  Gu clear yellow via foley    Resolved Hospital Problem list      Assessment & Plan:  Acute respiratory failure with hypoxia 2/2 pulmonary edema s/p re-exploration of right groin -extubated 8/27. Developed Progressive respiratory distress in the evening hrs treated w/ NIPPV and diuresis  -looks better now  Plan Cont supplemental oxygen Pulse ox IS  Lasix as BP/BUN/cr  tolerate Am cxr   HTN. W/ known h/o HTN  -on home HCTZ,  amlodipine and atenolol. Night team questioning BB w/d & started back Lopressor Plan Add back home amlodipine and atenolol  Lasix in place of HCTZ  New T wave inversion on 12 lead w/ troponin elevation Plan F/u echo   S/P ligation of right femoropopliteal vein bypass ligation of right external iliac artery, excision of right bovine pericardial patch with excision of right common femoral artery & negative pressure drain to right groin  Plan Cont drain management per vascular Close obs of BKA ->concern about stump ischemia  Serial CBCs Will check Lactate (I would expect if on-going limb ischemia this would also be elevated)   Pseudomonas aeruginosa Right femoral Prevena wound   Plan Cont cefepime. Will need 4-6 weeks therapy. Start date was 8/25  Acute blood loss anemia  Hgb stable Plan Trend  T2DM - adequate controlled.  Plan Ssi    Best Practice (right click and "Reselect all SmartList Selections" daily)   Diet/type: clear liquids advance as tolerated  DVT prophylaxis: other defer to primary ->seems like we can add prophylactic soon  GI prophylaxis: PPI Lines: Central line Foley:  Yes, and it is still needed Code Status:  full code Last date of multidisciplinary goals of care discussion [pending]  My cct 32 min  Simonne Martinet ACNP-BC [facility] Pulmonary/Critical Care Pager # [phone] OR # [phone] if no answer

## 2021-07-07 ENCOUNTER — Inpatient Hospital Stay (HOSPITAL_COMMUNITY): Payer: No Typology Code available for payment source

## 2021-07-07 DIAGNOSIS — Z9289 Personal history of other medical treatment: Secondary | ICD-10-CM

## 2021-07-07 DIAGNOSIS — I5020 Unspecified systolic (congestive) heart failure: Secondary | ICD-10-CM

## 2021-07-07 DIAGNOSIS — I10 Essential (primary) hypertension: Secondary | ICD-10-CM | POA: Diagnosis not present

## 2021-07-07 DIAGNOSIS — Z7189 Other specified counseling: Secondary | ICD-10-CM

## 2021-07-07 DIAGNOSIS — Z66 Do not resuscitate: Secondary | ICD-10-CM

## 2021-07-07 DIAGNOSIS — D62 Acute posthemorrhagic anemia: Secondary | ICD-10-CM | POA: Diagnosis not present

## 2021-07-07 DIAGNOSIS — E440 Moderate protein-calorie malnutrition: Secondary | ICD-10-CM

## 2021-07-07 DIAGNOSIS — Z515 Encounter for palliative care: Secondary | ICD-10-CM

## 2021-07-07 DIAGNOSIS — I729 Aneurysm of unspecified site: Secondary | ICD-10-CM

## 2021-07-07 DIAGNOSIS — J9602 Acute respiratory failure with hypercapnia: Secondary | ICD-10-CM | POA: Diagnosis not present

## 2021-07-07 LAB — BPAM RBC
Blood Product Expiration Date: 202209172359
Blood Product Expiration Date: 202209202359
Blood Product Expiration Date: 202209242359
Blood Product Expiration Date: 202209272359
Blood Product Expiration Date: 202209272359
Blood Product Expiration Date: 202209272359
Blood Product Expiration Date: 202209272359
ISSUE DATE / TIME: 202208262334
ISSUE DATE / TIME: 202208262334
ISSUE DATE / TIME: 202208262334
ISSUE DATE / TIME: 202208270328
ISSUE DATE / TIME: 202208270329
ISSUE DATE / TIME: 202208270329
ISSUE DATE / TIME: 202208270329
Unit Type and Rh: 5100
Unit Type and Rh: 5100
Unit Type and Rh: 5100
Unit Type and Rh: 5100
Unit Type and Rh: 5100
Unit Type and Rh: 5100
Unit Type and Rh: 5100

## 2021-07-07 LAB — CBC
HCT: 27.6 % — ABNORMAL LOW (ref 39.0–52.0)
HCT: 30.1 % — ABNORMAL LOW (ref 39.0–52.0)
Hemoglobin: 8.9 g/dL — ABNORMAL LOW (ref 13.0–17.0)
Hemoglobin: 9.5 g/dL — ABNORMAL LOW (ref 13.0–17.0)
MCH: 28.4 pg (ref 26.0–34.0)
MCH: 29 pg (ref 26.0–34.0)
MCHC: 32.2 g/dL (ref 30.0–36.0)
MCHC: 31.6 g/dL (ref 30.0–36.0)
MCV: 88.2 fL (ref 80.0–100.0)
MCV: 91.8 fL (ref 80.0–100.0)
Platelets: 331 10*3/uL (ref 150–400)
Platelets: 462 10*3/uL — ABNORMAL HIGH (ref 150–400)
RBC: 3.13 MIL/uL — ABNORMAL LOW (ref 4.22–5.81)
RBC: 3.28 MIL/uL — ABNORMAL LOW (ref 4.22–5.81)
RDW: 16.1 % — ABNORMAL HIGH (ref 11.5–15.5)
RDW: 15.9 % — ABNORMAL HIGH (ref 11.5–15.5)
WBC: 15.5 10*3/uL — ABNORMAL HIGH (ref 4.0–10.5)
WBC: 25.2 10*3/uL — ABNORMAL HIGH (ref 4.0–10.5)
nRBC: 0 % (ref 0.0–0.2)
nRBC: 0 % (ref 0.0–0.2)

## 2021-07-07 LAB — TYPE AND SCREEN
ABO/RH(D): O POS
Antibody Screen: NEGATIVE
Unit division: 0
Unit division: 0
Unit division: 0
Unit division: 0
Unit division: 0
Unit division: 0
Unit division: 0

## 2021-07-07 LAB — BASIC METABOLIC PANEL
Anion gap: 8 (ref 5–15)
BUN: 17 mg/dL (ref 8–23)
CO2: 22 mmol/L (ref 22–32)
Calcium: 8.4 mg/dL — ABNORMAL LOW (ref 8.9–10.3)
Chloride: 107 mmol/L (ref 98–111)
Creatinine, Ser: 0.49 mg/dL — ABNORMAL LOW (ref 0.61–1.24)
GFR, Estimated: >60 mL/min (ref >60)
Glucose, Bld: 116 mg/dL — ABNORMAL HIGH (ref 70–99)
Potassium: 3.5 mmol/L (ref 3.5–5.1)
Sodium: 137 mmol/L (ref 135–145)

## 2021-07-07 LAB — MAGNESIUM: Magnesium: 1.9 mg/dL (ref 1.7–2.4)

## 2021-07-07 LAB — PHOSPHORUS: Phosphorus: 2.4 mg/dL — ABNORMAL LOW (ref 2.5–4.6)

## 2021-07-07 LAB — GLUCOSE, CAPILLARY
Glucose-Capillary: 119 mg/dL — ABNORMAL HIGH (ref 70–99)
Glucose-Capillary: 127 mg/dL — ABNORMAL HIGH (ref 70–99)
Glucose-Capillary: 216 mg/dL — ABNORMAL HIGH (ref 70–99)
Glucose-Capillary: 131 mg/dL — ABNORMAL HIGH (ref 70–99)
Glucose-Capillary: 78 mg/dL (ref 70–99)

## 2021-07-07 LAB — LACTIC ACID, PLASMA: Lactic Acid, Venous: 0.7 mmol/L (ref 0.5–1.9)

## 2021-07-07 LAB — SURGICAL PCR SCREEN
MRSA, PCR: NEGATIVE
Staphylococcus aureus: NEGATIVE

## 2021-07-07 MED ORDER — CARVEDILOL 6.25 MG PO TABS
6.2500 mg | ORAL_TABLET | Freq: Two times a day (BID) | ORAL | Status: DC
  Administered 2021-07-07 – 2021-07-11 (×9): 6.25 mg via ORAL
  Filled 2021-07-07 (×9): qty 1

## 2021-07-07 MED ORDER — POTASSIUM PHOSPHATES 15 MMOLE/5ML IV SOLN
20.0000 mmol | Freq: Once | INTRAVENOUS | Status: AC
  Administered 2021-07-07: 20 mmol via INTRAVENOUS
  Filled 2021-07-07: qty 6.67

## 2021-07-07 MED ORDER — METOPROLOL TARTRATE 5 MG/5ML IV SOLN
INTRAVENOUS | Status: AC
  Administered 2021-07-07: 5 mg via INTRAVENOUS
  Filled 2021-07-07: qty 5

## 2021-07-07 MED ORDER — INSULIN ASPART 100 UNIT/ML IJ SOLN
0.0000 [IU] | Freq: Three times a day (TID) | INTRAMUSCULAR | Status: DC
  Administered 2021-07-07: 2 [IU] via SUBCUTANEOUS
  Administered 2021-07-07: 5 [IU] via SUBCUTANEOUS
  Administered 2021-07-08: 3 [IU] via SUBCUTANEOUS
  Administered 2021-07-08: 2 [IU] via SUBCUTANEOUS
  Administered 2021-07-09: 3 [IU] via SUBCUTANEOUS
  Administered 2021-07-09: 2 [IU] via SUBCUTANEOUS
  Administered 2021-07-10: 3 [IU] via SUBCUTANEOUS
  Administered 2021-07-10 – 2021-07-12 (×2): 2 [IU] via SUBCUTANEOUS
  Administered 2021-07-12 – 2021-07-13 (×3): 3 [IU] via SUBCUTANEOUS
  Administered 2021-07-13 – 2021-07-17 (×7): 2 [IU] via SUBCUTANEOUS

## 2021-07-07 MED ORDER — MUPIROCIN 2 % EX OINT: 1.0000 "application " | TOPICAL_OINTMENT | Freq: Two times a day (BID) | CUTANEOUS | Status: DC

## 2021-07-07 MED ORDER — METOPROLOL TARTRATE 5 MG/5ML IV SOLN: 5.0000 mg | Freq: Four times a day (QID) | INTRAVENOUS | Status: DC | PRN

## 2021-07-07 MED ORDER — TRAMADOL HCL 50 MG PO TABS
100.0000 mg | ORAL_TABLET | Freq: Four times a day (QID) | ORAL | Status: DC | PRN
  Administered 2021-07-07 – 2021-07-25 (×26): 100 mg via ORAL
  Filled 2021-07-07 (×30): qty 2

## 2021-07-07 MED ORDER — POTASSIUM CHLORIDE CRYS ER 20 MEQ PO TBCR
20.0000 meq | EXTENDED_RELEASE_TABLET | Freq: Once | ORAL | Status: AC
  Administered 2021-07-07: 20 meq via ORAL
  Filled 2021-07-07: qty 1

## 2021-07-07 MED ORDER — LOSARTAN POTASSIUM 25 MG PO TABS
25.0000 mg | ORAL_TABLET | Freq: Every day | ORAL | Status: DC
  Administered 2021-07-07 – 2021-07-08 (×2): 25 mg via ORAL
  Filled 2021-07-07 (×2): qty 1

## 2021-07-07 MED ORDER — FUROSEMIDE 10 MG/ML IJ SOLN
40.0000 mg | Freq: Once | INTRAMUSCULAR | Status: AC
  Administered 2021-07-07: 40 mg via INTRAVENOUS
  Filled 2021-07-07: qty 4

## 2021-07-07 MED ORDER — SODIUM CHLORIDE 0.9 % IV BOLUS: 500.0000 mL | Freq: Once | INTRAVENOUS | Status: DC

## 2021-07-07 MED ORDER — MORPHINE SULFATE (PF) 2 MG/ML IV SOLN
1.0000 mg | INTRAVENOUS | Status: DC | PRN
  Administered 2021-07-07 – 2021-07-23 (×15): 1 mg via INTRAVENOUS
  Filled 2021-07-07 (×15): qty 1

## 2021-07-07 NOTE — Consult Note (Signed)
Palliative Care Consult Note                                  Date: 07/07/2021   Patient Name: Todd Duran  DOB: 1946/04/09  MRN: [id number]  Age / Sex: 75 y.o., male  PCP: Pcp, No Referring Physician: Serafina Mitchell, MD  Reason for Consultation: Establishing goals of care  HPI/Patient Profile: Palliative Care consult requested for goals of care discussion in this 75 y.o. male  with past medical history of diabetes, hypertension, L AKA, PAD, tobacco/alcohol use, and multiple right lower extremity surgeries. 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 Currently being followed by Vascular Surgery. Right distal stump is cool to touch with pain.   Past Medical History:  Diagnosis Date  . Diabetes mellitus without complication ([place])   . Hypertension      Subjective:   This NP Osborne Oman reviewed medical records, received report from team, assessed the patient and then met at the patient's bedside with patient to discuss diagnosis, prognosis, GOC, EOL wishes disposition and options.  Patient is awake, alert and oriented x3. He complains of right stump pain.    Concept of Palliative Care was introduced as specialized medical care for people and their families living with serious illness.  It focuses on providing relief from the symptoms and stress of a serious illness.  The goal is to improve quality of life for both the patient and the family. Values and goals of care important to patient and family were attempted to be elicited.  Created space and opportunity for patient and family to explore state of health prior to admission, thoughts, and feelings. Patient shares prior to hospitalization he was independent of all ADLs. Reports appetite has declined since recent amputations.   We discussed His current illness and  what it means in the larger context of His on-going co-morbidities. Natural disease trajectory and expectations were discussed.  Kaysen verbalized understanding of current illness and co-morbidities. He shares he is thankful he has lived to see 75 years old. He acknowledges his health challenges and does not wish to every be in a suffering state.   Patient states he is ok with current quality of life and is hopeful for some stability, however if he declines or quality of life further declined he would be open to considering care to focus on his comfort at that time or allow his wife to make final decisions on his behalf.   I discussed the importance of continued conversation with family and their medical providers regarding overall plan of care and treatment options, ensuring decisions are within the context of the patients values and GOCs.  1330: Patient's wife and sister at the bedside. Updates provided at length. Reviewed previously discussed goals of care with family. Family able to verbalized understanding and discussion to family. Wife verbalized understanding. She confirmed wishes for DNR/DNI in support of patient's expressed wishes. He shares with her he wouldn't not want to be on any life-prolonging machines. His sister Ruby verbalizes her agreement also expressing they had discussed previously.   Patient and family clear in expressed goals to continue to treat the treatable allowing patient every opportunity to thrive. They are open to further surgery if necessary. Confirms DNR/DNI.  Questions and concerns were addressed.  Patient  and family encouraged to call with questions or concerns.PMT will continue to support holistically as needed.  Life Review: Patient is a retired Lawyer. He served in the [employer] for more than 8 years. He and his wife have only been married for 4 years however they have been together for more than 43 years. They have 1 son, Legrand Como.  Christian faith.    Objective:   Primary Diagnoses: Present on Admission: . Pseudoaneurysm ([place]) . Peripheral artery disease ([place]) . Acute blood loss anemia   Scheduled Meds: . aspirin  81 mg Oral Daily  . atorvastatin  40 mg Oral QHS  . carvedilol  6.25 mg Oral BID WC  . Chlorhexidine Gluconate Cloth  6 each Topical Q0600  . clopidogrel  75 mg Oral Daily  . docusate sodium  100 mg Oral BID  . feeding supplement  237 mL Oral BID BM  . furosemide  40 mg Oral Daily  . gabapentin  100 mg Oral TID  . insulin aspart  0-15 Units Subcutaneous TID AC & HS  . losartan  25 mg Oral Daily  . mouth rinse  15 mL Mouth Rinse BID  . methocarbamol  750 mg Oral QID  . multivitamin with minerals  1 tablet Oral Daily  . mupirocin ointment  1 application Nasal BID  . pantoprazole  40 mg Oral Daily  . polyethylene glycol  17 g Oral Daily  . sodium chloride flush  10-40 mL Intracatheter Q12H    Continuous Infusions: . sodium chloride 10 mL/hr at 07/06/21 1200  . ceFEPime (MAXIPIME) IV Stopped (07/07/21 0108)  . magnesium sulfate bolus IVPB    . potassium PHOSPHATE IVPB (in mmol) 20 mmol (07/07/21 0918)    PRN Meds: sodium chloride, acetaminophen **OR** acetaminophen, alum & mag hydroxide-simeth, hydrALAZINE, magnesium sulfate bolus IVPB, morphine injection, ondansetron, phenol, sennosides, sodium chloride flush, traMADol  Allergies  Allergen Reactions  . Lisinopril Swelling    Swelling of lips    Review of Systems  Musculoskeletal:  Positive for arthralgias.  Neurological:  Positive for weakness.  Unless otherwise noted, a complete review of systems is negative.  Physical Exam General: NAD, chronically-ill appearing Cardiovascular: regular rate and rhythm Pulmonary: clear ant fields Abdomen: soft, nontender, + bowel sounds Extremities: L AKA, R BKA with staples intact, JP drain Skin: no rashes, warm and dry Neurological: AAO x3, mood appropriate   Vital Signs:  BP 127/80    Pulse 98   Temp 98.4 F (36.9 C) (Oral)   Resp 20   Ht 4' 5"  (1.346 m)   Wt 57.7 kg   SpO2 98%   BMI 31.84 kg/m  Pain Scale: 0-10 POSS *See Group Information*: 1-Acceptable,Awake and alert Pain Score: 9   SpO2: SpO2: 98 % O2 Device:SpO2: 98 % O2 Flow Rate: .O2 Flow Rate (L/min): 3 L/min  IO: Intake/output summary:  Intake/Output Summary (Last 24 hours) at 07/07/2021 1102 Last data filed at 07/07/2021 0900 Gross per 24 hour  Intake 1069.61 ml  Output 775 ml  Net 294.61 ml    LBM: Last BM Date: 07/05/21 Baseline Weight: Weight: 57.5 kg Most recent weight: Weight: 57.7 kg      Palliative Assessment/Data: PPS 30%   Advanced Care Planning:   Primary Decision Maker: NEXT OF KIN  Code Status/Advance Care Planning: DNR  A discussion was had today regarding advanced directives. Concepts specific to code status, artifical feeding and hydration, continued IV antibiotics and rehospitalization was had.     I  discussed at length with patient his current full code status with consideration of his current illness and co-morbidities. We discussed what a code would look like for him. Mr. makani seckman understanding expressing wishes for DNR/DNI. He states he is ok if he had to have surgery but outside of this he would not want to be on any life-sustaining machines including feeding tubes. Education provided on DNR/DNI. He verbalized understanding again confirming his wishes. I explained I would follow up with his wife prior to making any changes.    Assessment & Plan:   SUMMARY OF RECOMMENDATIONS   DNR/DNI-as confirmed by patient and his wife Continue with current plan of care, treat the treatable Patient and wife is clear in expressed goals to continue with all care. They are remaining hopeful for stability. They would be ok with further surgeries if needed to allow him every opportunity to thrive. Their goal is for him to eventually return home with family.  PMT will continue  to support and follow as needed. Please call team line with urgent needs.  Palliative Prophylaxis:  Frequent Pain Assessment  Additional Recommendations (Limitations, Scope, Preferences): No Artificial Feeding, No Tracheostomy, and DNR/DNI, treat the treatable  Psycho-social/Spiritual:  Desire for further Chaplaincy support: yes Additional Recommendations:  ongoing discussions and support   Prognosis:  Guarded  Discharge Planning:  To Be Determined   Patient and his wife expressed understanding and was in agreement with this plan.   Time: 3818-4037 Time : 1330-1405 Time Total: 70 min.   Visit consisted of counseling and education dealing with the complex and emotionally intense issues of symptom management and palliative care in the setting of serious and potentially life-threatening illness.Greater than 50%  of this time was spent counseling and coordinating care related to the above assessment and plan.  Signed by:  Alda Lea, AGPCNP-BC Palliative Medicine Team  Phone: [phone] Pager: [phone] Amion: Bjorn Pippin   Thank you for allowing the Palliative Medicine Team to assist in the care of this patient. Please utilize secure chat with additional questions, if there is no response within 30 minutes please call the above phone number. Palliative Medicine Team providers are available by phone from 7am to 5pm daily and can be reached through the team cell phone.  Should this patient require assistance outside of these hours, please call the patient's attending physician.

## 2021-07-07 NOTE — TOC CM/SW Note (Addendum)
HF TOC CM received a message from April, [place] that pt was serviced out of the [facility] in [place]. Contacts are Renne Crigler and Graylon Gunning [phone] ext [phone]. TOC CM spoke to [place] and she is requesting a call from CM. Isidoro Donning RN3 CCM, Heart Failure TOC CM [phone]

## 2021-07-07 NOTE — Progress Notes (Signed)
NAME:  Todd Duran, MRN:  [id number], DOB:  [birth year], LOS: 10 ADMISSION DATE:  06/27/2021, CONSULTATION DATE:  8/27 REFERRING MD:  Dr. Myra Gianotti, CHIEF COMPLAINT:   post procedural intubation  History of Present Illness:  Patient is a 75 year old male with PMH of DM T2, HTN, bilateral BKA presents to [facility] on 8/20 with pseudoaneurysm of R femoral artery.  On 7/13 patient received left BKA.  Patient was brought in on 7/29 for right toe amputation.  Right femoral to popliteal artery bypass grafting was done on 8/3.  On 8/13, patient presents with infected right foot and on 8/14 a right BKA was performed.   On 8/16 patient was sent to CIR.  On 8/20 vascular surgery consulted for bleeding on right groin and hemoglobin dropped to 6.9.  CT showing pseudoaneurysm right femoral artery.  On 8/20 vascular surgery performed surgery to explore pseudoaneurysm and repair of bovine pericardial patch angioplasty.  Wound VAC in place.  On 8/23, vascular surgery performed revision of right femoral retroperitoneal right iliac artery exposure, redo right femoral artery exposure, and endarterectomy and placement of multiple budgeted sutures for hemostasis.  Right femoral Prevena wound VAC postop cultures show Pseudomonas aeruginosa.  Started on cefepime.  On 8/26, acute bleed from right groin with VAC output of greater than 500 mL.  Performed right groin exploration and evacuation of right groin hematoma.  On 8/27, patient brought back to the OR for reexploration of right groin, ligation of right femoropopliteal vein bypass, ligation of right external iliac artery, excision of right bovine pericardial patch with excision of right common femoral artery, and negative pressure drain to right groin.  Patient remains intubated postprocedure due to unable to wean off ventilator.  PCCM consulted for ventilator management.   Pertinent  Medical History   Past Medical History:  Diagnosis Date   Diabetes mellitus without  complication (HCC)    Hypertension    Significant Hospital Events: Including procedures, antibiotic start and stop dates in addition to other pertinent events   8/27: right groin re-exploration and evacuation of hematoma, very friable vessels, ultimately opted for ligation.  8/28 more comfortable. No signs of bleeding.  But ended up on BIPAP; 2/2 edema seemed to respond to edema  8/29 adding back home meds. Palliative consulted by primary team.  Echocardiogram: Ejection fraction 30 to 35%, global hypokinesis, LV mildly dilated.  Diastolic parameters consistent with grade 2 diastolic dysfunction.  RV size normal, RV function normal.  Lactate elevated at 4 8/30: Quiet evening.  Blood pressure under control.  Lactate cleared to 0.7 bedside and assessment of lower limb, seemed a little bit warmer on exam.  No distress.  Interim History / Subjective:  Awake.  Pain tolerable.  Wants to go home.  Knows he is got a long way to go before this  Objective   Blood pressure 130/83, pulse 99, temperature 98.8 F (37.1 C), temperature source Oral, resp. rate (Abnormal) 28, height 4\' 5"  (1.346 m), weight 57.7 kg, SpO2 94 %.        Intake/Output Summary (Last 24 hours) at 07/07/2021 0746 Last data filed at 07/07/2021 0600 Gross per 24 hour  Intake 1420.05 ml  Output 925 ml  Net 495.05 ml   Filed Weights   07/05/21 0550 07/06/21 0300 07/07/21 0630  Weight: 60.2 kg 58.2 kg 57.7 kg    Examination: General this is a 76 year old male patient he sitting upright in bed, just finishing breakfast he is in no acute distress this morning  HEENT normocephalic atraumatic no jugular venous distention appreciated his mucous membranes are moist Pulmonary: Clear to auscultation currently on 3 L/min via nasal cannula no accessory use demonstrated, pulse oximetry 100% Cardiac: Regular rate and rhythm Abdomen: Soft not tender no organomegaly tolerating diet but appetite is poor Extremities: Left AKA warm and dry, the  right BKA seems a little warmer today to palpation.  The staples are well approximated there is no drainage the right groin dressing is intact minimal serous drainage GU clear yellow urine Neuro awake oriented and without focal deficits   Resolved Hospital Problem list      Assessment & Plan:  Acute respiratory failure with hypoxia 2/2 pulmonary edema s/p re-exploration of right groin -extubated 8/27. Developed Progressive respiratory distress in the evening hrs treated w/ NIPPV and diuresis  -Much improved, favor pulmonary edema responded to diuresis Plan Continue supplemental oxygen weaning for pulse oximetry greater than 90 Incentive spirometry Mobilization Continuing diuresis as tolerated We will remove BiPAP from the room  HTN. W/ known h/o HTN  Plan Continue amlodipine and atenolol  Lasix in place of hydrochlorothiazide   Newly identified systolic heart failure (EF 30 to 35% with global hypokinesis) favor T wave inversion on 12 lead w/ troponin elevation the result of demand ischemia Plan Continue beta-blockade Continue aspirin and atorvastatin, also already on Plavix.  Given his multiple comorbidities he is not a candidate for left heart catheterization or any further aggressive therapy Could consider cardiology consultation as an outpatient or at least to get him set up for outpatient cardiac follow-up  S/P ligation of right femoropopliteal vein bypass ligation of right external iliac artery, excision of right bovine pericardial patch with excision of right common femoral artery & negative pressure drain to right groin  Plan Drain management per vascular  Close observation of right leg circulation, ongoing concern that he may need more proximal amputation however this morning I feel like legs a little warmer    Pseudomonas aeruginosa Right femoral Prevena wound   Plan Cefepime was started 8/25 with plan to complete 4 to 6 weeks of therapy PICC line was already been  placed   Fluid and electrolyte imbalance: Mild hypophosphatemia, Plan We will give potassium phosphate given ongoing plan for diuresis  Acute blood loss anemia  Hgb stable Plan Trend  T2DM - adequate controlled.  Plan Sliding scale insulin, changing to a CHS coverage from every 4   Best Practice (right click and "Reselect all SmartList Selections" daily)   Diet/type: clear liquids advance as tolerated  DVT prophylaxis: other defer to primary ->seems like we can add prophylactic soon  GI prophylaxis: PPI Lines: Central line Foley:  Yes, and it is still needed Code Status:  full code Last date of multidisciplinary goals of care discussion [pending]   I think he can be safely moved out of the intensive care.  Agree with palliative consultation.  Continue medical management as above.  Pulmonary/critical care will sign off.   Simonne Martinet ACNP-BC [facility] Pulmonary/Critical Care Pager # [phone] OR # [phone] if no answer

## 2021-07-07 NOTE — Progress Notes (Signed)
eLink Physician-Brief Progress Note Patient Name: SCOT SHIRAISHI DOB: 1945-11-20 MRN: [id number]   Date of Service  07/07/2021  HPI/Events of Note  CXR reviewed. It's consistent with pulmonary edema but patient received Lasix at 20:55 hours.  eICU Interventions  No intervention, monitor response to Lasix.        [name] Leshea Jaggers 07/07/2021, 10:45 PM

## 2021-07-07 NOTE — Progress Notes (Signed)
Called to room  Pt had increased WOB, new tachycardia and wheezing Was administered neb FIO2 titrated up  -this is similar to what he did 48 hrs ago when we treated him for flash pulmonary edema Plan IV lasix PCXR BIPAP  Will have Elink follow  Simonne Martinet ACNP-BC [facility] Pulmonary/Critical Care Pager # [phone] OR # [phone] if no answer

## 2021-07-07 NOTE — Progress Notes (Addendum)
  Progress Note    07/07/2021 7:05 AM 3 Days Post-Op  Subjective:  says he has some pain in the distal stump  Tm 99.3 now afebrile HR 90's-100's  110's-130's systolic 100%3LO2NC  ABX:  cefepime   Vitals:   07/07/21 0600 07/07/21 0700  BP: 130/79 130/83  Pulse: 98 99  Resp: (!) 21 (!) 28  Temp:    SpO2: 96% 94%    Physical Exam: Cardiac:  no distress Lungs:  non labored Incisions:  right groin incision with staples in tact and serosanguinous drainage present.  Proximal incision clean and dry with staples in tact.  Extremities:  right stump cool to touch with some mottling.  Thigh is warm.   CBC    Component Value Date/Time   WBC 15.5 (H) 07/07/2021 0547   RBC 3.13 (L) 07/07/2021 0547   HGB 8.9 (L) 07/07/2021 0547   HCT 27.6 (L) 07/07/2021 0547   PLT 331 07/07/2021 0547   MCV 88.2 07/07/2021 0547   MCH 28.4 07/07/2021 0547   MCHC 32.2 07/07/2021 0547   RDW 16.1 (H) 07/07/2021 0547   LYMPHSABS 1.9 06/24/2021 0518   MONOABS 0.6 06/24/2021 0518   EOSABS 0.2 06/24/2021 0518   BASOSABS 0.1 06/24/2021 0518    BMET    Component Value Date/Time   NA 137 07/07/2021 0547   K 3.5 07/07/2021 0547   CL 107 07/07/2021 0547   CO2 22 07/07/2021 0547   GLUCOSE 116 (H) 07/07/2021 0547   BUN 17 07/07/2021 0547   CREATININE 0.49 (L) 07/07/2021 0547   CALCIUM 8.4 (L) 07/07/2021 0547   GFRNONAA >60 07/07/2021 0547    INR    Component Value Date/Time   INR 1.2 06/30/2021 1608     Intake/Output Summary (Last 24 hours) at 07/07/2021 0705 Last data filed at 07/07/2021 0600 Gross per 24 hour  Intake 1420.05 ml  Output 1275 ml  Net 145.05 ml     Assessment/Plan:  75 y.o. male is s/p:  S/p ligation of right EIA and removal of bovine patch  3 Days Post-Op   -pt right distal stump cool to touch and he is having some pain.  Most likely will require more proximal amputation.  Discussed this with pt. -cefepime for pseudomonas-leukocytosis improved from 32k on 8/27 to  15.5k today. -dressing removed from groin incision-looks good-still having some serosanguinous drainage from incision.  JP drain with 50cc/24hr (10cc last shift). -palliative consult has been called per note yesterday.   Doreatha Massed, PA-C [facility] [phone] 07/07/2021 7:05 AM   I agree with the above.  I spoke to the patient about the possibility of a more proximal amputation.  I tried to call his wife but could not reach her.  Continue IV abx and dressing changes.  D/c JP tomorrow if output remains low.  [name]

## 2021-07-07 NOTE — Care Management (Addendum)
07-07-21 1050 Case Manager received a call from the [facility] Synetta Fail. Synetta Fail had questions regarding a wound vac order that was previously submitted for this patient. Per staff RN, the patient does not have a wound vac on at this time. Synetta Fail can be reached at [phone] for disposition needs and durable medical equipment. Physician notes state that palliative care will be consulted. Case Manager will continue to follow for disposition needs.   07-07-21 1338 Case Manager received call from the HF Case Manager to call [facility]. Case Manager called the [facility] and spoke with Kim @ [phone] ext [phone]. Kim states the patient is 80% service connected and that the patient was previously active with [facility] for services. Case Manager will continue to follow additional needs.

## 2021-07-08 DIAGNOSIS — J9601 Acute respiratory failure with hypoxia: Secondary | ICD-10-CM

## 2021-07-08 DIAGNOSIS — J811 Chronic pulmonary edema: Secondary | ICD-10-CM

## 2021-07-08 DIAGNOSIS — J9602 Acute respiratory failure with hypercapnia: Secondary | ICD-10-CM | POA: Diagnosis not present

## 2021-07-08 LAB — CBC
HCT: 27 % — ABNORMAL LOW (ref 39.0–52.0)
Hemoglobin: 8.5 g/dL — ABNORMAL LOW (ref 13.0–17.0)
MCH: 28.5 pg (ref 26.0–34.0)
MCHC: 31.5 g/dL (ref 30.0–36.0)
MCV: 90.6 fL (ref 80.0–100.0)
Platelets: 366 10*3/uL (ref 150–400)
RBC: 2.98 MIL/uL — ABNORMAL LOW (ref 4.22–5.81)
RDW: 15.9 % — ABNORMAL HIGH (ref 11.5–15.5)
WBC: 16.4 10*3/uL — ABNORMAL HIGH (ref 4.0–10.5)
nRBC: 0 % (ref 0.0–0.2)

## 2021-07-08 LAB — BASIC METABOLIC PANEL
Anion gap: 6 (ref 5–15)
Anion gap: 6 (ref 5–15)
BUN: 20 mg/dL (ref 8–23)
BUN: 20 mg/dL (ref 8–23)
CO2: 23 mmol/L (ref 22–32)
CO2: 24 mmol/L (ref 22–32)
Calcium: 8.2 mg/dL — ABNORMAL LOW (ref 8.9–10.3)
Calcium: 8.4 mg/dL — ABNORMAL LOW (ref 8.9–10.3)
Chloride: 107 mmol/L (ref 98–111)
Chloride: 104 mmol/L (ref 98–111)
Creatinine, Ser: 0.67 mg/dL (ref 0.61–1.24)
Creatinine, Ser: 0.7 mg/dL (ref 0.61–1.24)
GFR, Estimated: >60 mL/min (ref >60)
GFR, Estimated: >60 mL/min (ref >60)
Glucose, Bld: 141 mg/dL — ABNORMAL HIGH (ref 70–99)
Glucose, Bld: 173 mg/dL — ABNORMAL HIGH (ref 70–99)
Potassium: 4.1 mmol/L (ref 3.5–5.1)
Potassium: 4 mmol/L (ref 3.5–5.1)
Sodium: 136 mmol/L (ref 135–145)
Sodium: 134 mmol/L — ABNORMAL LOW (ref 135–145)

## 2021-07-08 LAB — GLUCOSE, CAPILLARY
Glucose-Capillary: 112 mg/dL — ABNORMAL HIGH (ref 70–99)
Glucose-Capillary: 131 mg/dL — ABNORMAL HIGH (ref 70–99)
Glucose-Capillary: 152 mg/dL — ABNORMAL HIGH (ref 70–99)
Glucose-Capillary: 161 mg/dL — ABNORMAL HIGH (ref 70–99)
Glucose-Capillary: 63 mg/dL — ABNORMAL LOW (ref 70–99)

## 2021-07-08 LAB — CK: Total CK: 253 U/L (ref 49–397)

## 2021-07-08 MED ORDER — SACUBITRIL-VALSARTAN 24-26 MG PO TABS
1.0000 | ORAL_TABLET | Freq: Two times a day (BID) | ORAL | Status: DC
  Administered 2021-07-09 – 2021-07-17 (×15): 1 via ORAL
  Filled 2021-07-08 (×17): qty 1

## 2021-07-08 MED ORDER — FUROSEMIDE 10 MG/ML IJ SOLN
40.0000 mg | Freq: Two times a day (BID) | INTRAMUSCULAR | Status: DC
  Administered 2021-07-08 – 2021-07-11 (×6): 40 mg via INTRAVENOUS
  Filled 2021-07-08 (×6): qty 4

## 2021-07-08 MED ORDER — FUROSEMIDE 10 MG/ML IJ SOLN: 40.0000 mg | Freq: Every day | INTRAMUSCULAR | Status: DC

## 2021-07-08 MED ORDER — FUROSEMIDE 10 MG/ML IJ SOLN
40.0000 mg | Freq: Once | INTRAMUSCULAR | Status: AC
  Administered 2021-07-08: 40 mg via INTRAVENOUS
  Filled 2021-07-08: qty 4

## 2021-07-08 NOTE — Progress Notes (Signed)
Pt placed on Bipap. Pt is resting comfortably sat 100%. RT will cont to monitor.

## 2021-07-08 NOTE — Progress Notes (Addendum)
  Progress Note    07/08/2021 7:34 AM 4 Days Post-Op  Subjective:  Alert and oriented this morning.  Somewhat SOB but O2 sat 94 on RA.  Pain from R BKA stump   Vitals:   07/08/21 0600 07/08/21 0700  BP: 116/74 135/85  Pulse:  99  Resp: (!) 21 (!) 23  Temp: 98.8 F (37.1 C)   SpO2:  100%   Physical Exam: Lungs:  non labored Incisions:  L AKA well healed; R groin healing well; JP dry; R BKA staples intact Extremities:  cold BKA stump with temperature change in distal thigh; pain with manipulation of R knee Neurologic: A&O  CBC    Component Value Date/Time   WBC 16.4 (H) 07/08/2021 0331   RBC 2.98 (L) 07/08/2021 0331   HGB 8.5 (L) 07/08/2021 0331   HCT 27.0 (L) 07/08/2021 0331   PLT 366 07/08/2021 0331   MCV 90.6 07/08/2021 0331   MCH 28.5 07/08/2021 0331   MCHC 31.5 07/08/2021 0331   RDW 15.9 (H) 07/08/2021 0331   LYMPHSABS 1.9 06/24/2021 0518   MONOABS 0.6 06/24/2021 0518   EOSABS 0.2 06/24/2021 0518   BASOSABS 0.1 06/24/2021 0518    BMET    Component Value Date/Time   NA 136 07/08/2021 0331   K 4.1 07/08/2021 0331   CL 107 07/08/2021 0331   CO2 23 07/08/2021 0331   GLUCOSE 141 (H) 07/08/2021 0331   BUN 20 07/08/2021 0331   CREATININE 0.67 07/08/2021 0331   CALCIUM 8.2 (L) 07/08/2021 0331   GFRNONAA >60 07/08/2021 0331    INR    Component Value Date/Time   INR 1.2 06/30/2021 1608     Intake/Output Summary (Last 24 hours) at 07/08/2021 0734 Last data filed at 07/08/2021 0600 Gross per 24 hour  Intake 1836.08 ml  Output 2173 ml  Net -336.92 ml     Assessment/Plan:  75 y.o. male is s/p ligation of R EIA artery 4 Days Post-Op   R BKA cold with severe pain to light palpation; check CK; renal function still at baseline; WBC 16 CCM treating pulmonary edema with diuresis Encouraged nutrition; seen by dietician yesterday Remove JP Hospitalist will take over per CCM    Emilie Rutter, PA-C [facility] [phone] 07/08/2021 7:34 AM  I agree with the above.  I have seen and evaluated the patient.  His stump continues to demarcate.  He states that the pain in the stump is tolerable.  His CK was normal.  I will check a white count tomorrow.  We did discuss that he is at high risk for conversion of his amputation to an above-knee on the right.  We will monitor this closely.  [name]

## 2021-07-08 NOTE — Progress Notes (Signed)
   Daily Progress Note   Patient Name: Todd Duran       Date: 07/08/2021 DOB: 27-Nov-1945  Age: 75 y.o. MRN#: [id number] Attending Physician: Nada Libman, MD Primary Care Physician: Pcp, No Admit Date: 06/27/2021  Reason for Consultation/Follow-up: Establishing goals of care  Subjective: Chart Reviewed. Updates Received. Patient Assessed.   Patient sitting up in recliner. States it feels good to be out of the bed. Complains of right stump pain but is comfortable currently. Eating his lunch. His sister is at the bedside.   Reviewed goals of care discussions. Patient expressed goals remain for continued treatment allowing him every opportunity to improve. He is hopeful he doesn't have to have additional surgeries but states he is in agreement if required. Confirms DNR/DNI.   All questions answered and support provided.   Length of Stay: 11 days  Vital Signs: BP 118/70   Pulse 87   Temp (!) 97.3 F (36.3 C) (Axillary)   Resp (!) 22   Ht 4\' 5"  (1.346 m)   Wt 57.7 kg   SpO2 (!) 89%   BMI 31.84 kg/m  SpO2: SpO2: (!) 89 % O2 Device: O2 Device: Nasal Cannula O2 Flow Rate: O2 Flow Rate (L/min): 8 L/min  Physical Exam: Elderly, thin, AA male RRR Clear bilaterally L AKA, R BKA with staples intact AAO x3, mood appropriate                Palliative Care Assessment & Plan  HPI: Palliative Care consult requested for goals of care discussion in this 75 y.o. male  with past medical history of diabetes, hypertension, L AKA, PAD, tobacco/alcohol use, and multiple right lower extremity surgeries. 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 Currently being followed by Vascular Surgery. Right distal stump is cool to touch with pain.    Code Status: DNR  Goals of Care/Recommendations: Continue with current plan of care  Hopeful for some stability/improvement with  a goal for rehab and eventually returning home with family support.  Patient and family appreciative of discussions and updates. Request ongoing palliative support.  PMT will continue to support and follow as needed. May not be seen daily. Please call with urgent needs.   Prognosis: Guarded  Discharge Planning: To Be Determined ?CIR  Thank you for allowing the Palliative Medicine Team to assist in the care of this patient.  Time Total:  40 min.   Visit consisted of counseling and education dealing with the complex and emotionally intense issues of symptom management and palliative care in the setting of serious and potentially life-threatening illness.Greater than 50%  of this time was spent counseling and coordinating care related to the above assessment and plan.  06/23/2021, AGPCNP-BC  Palliative Medicine Team [phone]

## 2021-07-08 NOTE — Progress Notes (Signed)
Physical Therapy Treatment Patient Details Name: Todd Duran MRN: [id number] DOB: [birth year] Today's Date: 07/08/2021    History of Present Illness Pt is a 74 yo male admitted on 7/29 from CIR due to R lower limb ischemia. Pt underwent R femoral endarterectomy, R iliac & superficial fem art stent, R 5th toe amp. 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 Transferred to Rehab on 8/16.  Back to Acute on 8/20 due to pseudoaneurysm with exploration. Continued active bleeding on 8/21. Wound vac placed 8/21 PM. On 8/27 pt had extensive bleeding from rt groin and taken to OR emergently x 2.  On second surgery pt underwent ligation of external iliac artery and right leg bypass and excised the infected patch in the right common femoral artery. Post op pt remained intubated until extubated 8/28. Pt on bipap until 8/29.  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    PT Comments    Pt admitted with above diagnosis. Pt was able to assist with posterior transfer to chair today with less pain and good effort by pt given.  Pt needing mod assist of 2 persons but overall did well with the technique.   Pt currently with functional limitations due to balance and endurance deficits. Pt will benefit from skilled PT to increase their independence and safety with mobility to allow discharge to the venue listed below.      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  overall mod assist for bed mobility for LB and trunk support; rolling in bed with min assist    Transfers Overall transfer level: Needs assistance   Transfers: Anterior-Posterior Transfer       Anterior-Posterior transfers: +2 physical assistance;Mod assist;From elevated surface   General transfer comment: Pt was able to perform posterior transfer into chair with mod assist with use of pad.  Pt was able to push up on UEs to lift self to assit with posterior transfer. Pt tolerated well overall today. Obtained nurse to show her the technique to get pt back to bed.  Ambulation/Gait             General Gait Details: Not able   Stairs             Wheelchair Mobility    Modified Rankin (Stroke Patients Only)       Balance                                            Cognition Arousal/Alertness: Awake/alert Behavior During Therapy: Flat affect Overall Cognitive Status: No family/caregiver present to determine baseline  cognitive functioning Area of Impairment: Problem solving;Awareness;Following commands                 Orientation Level: Disoriented to;Time;Situation Current Attention Level: Focused Memory: Decreased recall of precautions;Decreased short-term memory Following Commands: Follows one step commands consistently;Follows one step commands with increased time;Follows multi-step commands inconsistently Safety/Judgement: Decreased awareness of safety;Decreased awareness of deficits Awareness: Emergent Problem Solving: Slow processing;Decreased initiation;Difficulty sequencing;Requires verbal cues;Requires tactile  cues General Comments: Much better following 1 step commands today.      Exercises General Exercises - Lower Extremity Ankle Circles/Pumps: AROM;Right;10 reps;Supine Quad Sets: AROM;10 reps;Left;Supine Straight Leg Raises: AROM;10 reps;Seated;Both Amputee Exercises Knee Extension: AROM;Left;10 reps;Seated    General Comments General comments (skin integrity, edema, etc.): VSS on 10LO2 with HR 86 bpm and O2 100%, 97/60      Pertinent Vitals/Pain Pain Assessment: Faces Faces Pain Scale: Hurts little more Pain Location: buttocks, R LE Pain Descriptors / Indicators: Grimacing;Guarding;Discomfort Pain Intervention(s): Limited activity within patient's tolerance;Monitored during session;Repositioned    Home Living                      Prior Function            PT Goals (current goals can now be found in the care plan section) Acute Rehab PT Goals Patient Stated Goal: none stated Progress towards PT goals: Progressing toward goals    Frequency    Min 3X/week      PT Plan Current plan remains appropriate    Co-evaluation              AM-PAC PT "6 Clicks" Mobility   Outcome Measure  Help needed turning from your back to your side while in a flat bed without using bedrails?: A Lot Help needed moving from lying on your back to sitting on the side of a flat bed without using bedrails?: A Lot Help needed moving to and from a bed to a chair (including a wheelchair)?: Total Help needed standing up from a chair using your arms (e.g., wheelchair or bedside chair)?: Total Help needed to walk in hospital room?: Total Help needed climbing 3-5 steps with a railing? : Total 6 Click Score: 8    End of Session Equipment Utilized During Treatment: Other (comment) (transfer pads) Activity Tolerance: Patient tolerated treatment well;Patient limited by fatigue Patient left: with call bell/phone within reach;in chair;with chair alarm set Nurse Communication: Mobility  status PT Visit Diagnosis: Other abnormalities of gait and mobility (R26.89);Pain Pain - Right/Left: Right Pain - part of body: Hip;Leg     Time: 4008-6761 PT Time Calculation (min) (ACUTE ONLY): 29 min  Charges:  $Therapeutic Exercise: 8-22 mins $Therapeutic Activity: 8-22 mins                     Todd Duran M,PT Acute Rehab Services [phone] (pager)    Todd Duran 07/08/2021, 11:49 AM

## 2021-07-08 NOTE — Progress Notes (Signed)
Occupational Therapy Treatment Patient Details Name: Todd Duran MRN: [id number] DOB: 06/16/1946 Today's Date: 07/08/2021    History of present illness Pt is a 75 yo male admitted on 7/29 from CIR due to R lower limb ischemia. Pt underwent R femoral endarterectomy, R iliac & superficial fem art stent, R 5th toe amp. 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 Transferred to Rehab on 8/16.  Back to Acute on 8/20 due to pseudoaneurysm with exploration. Continued active bleeding on 8/21. Wound vac placed 8/21 PM. On 8/27 pt had extensive bleeding from rt groin and taken to OR emergently x 2.  On second surgery pt underwent ligation of external iliac artery and right leg bypass and excised the infected patch in the right common femoral artery. Post op pt remained intubated until extubated 8/28. Pt on bipap until 8/29.  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   OT comments  Pt seated in recliner and engaged in OT session. Pt requires min assist for lateral leans in chair for simulated LB dressing with total assist, discussed weightshifting for pressure relief.  UB dressing with min assist, grooming with setup assist.  VSS during session, pt fatigues easily.  Increased time to follow commands, multimodal cueing for multiple step commands.    Follow Up Recommendations  CIR    Equipment Recommendations  Tub/shower bench;Wheelchair (measurements OT);Wheelchair cushion (measurements OT)    Recommendations for Other Services Rehab consult    Precautions / Restrictions Precautions Precautions: Fall Precaution Comments: L AKA (05/20/2021), R BKA (06/21/2021), confusion Required Braces or Orthoses: Other Brace Other Brace: limb protector Restrictions Weight Bearing Restrictions: Yes RLE Weight Bearing: Non weight bearing LLE Weight Bearing: Non weight bearing       Mobility Bed Mobility        General  bed mobility comments: OOB in recliner upon entry    Transfers    General transfer comment: deferred    Balance Overall balance assessment: Needs assistance Sitting-balance support: Feet unsupported;Bilateral upper extremity supported;No upper extremity supported Sitting balance-Leahy Scale: Poor Sitting balance - Comments: pt relies on UE support dynamically                                   ADL either performed or assessed with clinical judgement   ADL Overall ADL's : Needs assistance/impaired     Grooming: Wash/dry face;Set up;Sitting           Upper Body Dressing : Minimal assistance;Sitting   Lower Body Dressing: Total assistance;Sitting/lateral leans Lower Body Dressing Details (indicate cue type and reason): simulated in chair, lateral leans with min assist but would require total assist for LB clothing mgmt             Functional mobility during ADLs: Minimal assistance (from chair, lateral leans only)       Vision       Perception     Praxis      Cognition Arousal/Alertness: Awake/alert Behavior During Therapy: Flat affect Overall Cognitive Status: No family/caregiver present to determine baseline cognitive functioning Area of Impairment: Problem solving;Following commands;Memory;Attention;Safety/judgement;Awareness                 Orientation Level: Disoriented to;Time;Situation Current Attention Level: Sustained Memory: Decreased short-term memory Following Commands: Follows one step commands consistently;Follows one step commands with increased time;Follows multi-step  commands inconsistently Safety/Judgement: Decreased awareness of safety;Decreased awareness of deficits Awareness: Emergent Problem Solving: Slow processing;Decreased initiation;Difficulty sequencing;Requires verbal cues;Requires tactile cues General Comments: pt following 1 step commands but difficulty with mulitple step requring mulitmodal cueing, slow  processing and decreased probelm sovling        Exercises Exercises: Other exercises General Exercises - Lower Extremity Ankle Circles/Pumps: AROM;Right;10 reps;Supine Quad Sets: AROM;10 reps;Left;Supine Straight Leg Raises: AROM;10 reps;Seated;Both Amputee Exercises Knee Extension: AROM;Left;10 reps;Seated Other Exercises Other Exercises: lateral leaning in recliner to off weight hips with min assist Other Exercises: chair pushups x 5 with minimal hip clearance   Shoulder Instructions       General Comments VSS on 8L HFNC    Pertinent Vitals/ Pain       Pain Assessment: Faces Faces Pain Scale: Hurts little more Pain Location: buttocks, R LE Pain Descriptors / Indicators: Grimacing;Guarding;Discomfort Pain Intervention(s): Limited activity within patient's tolerance;Monitored during session;Repositioned;RN gave pain meds during session  Home Living                                          Prior Functioning/Environment              Frequency  Min 2X/week        Progress Toward Goals  OT Goals(current goals can now be found in the care plan section)  Progress towards OT goals: Progressing toward goals  Acute Rehab OT Goals Patient Stated Goal: none stated OT Goal Formulation: With patient  Plan Discharge plan remains appropriate;Frequency remains appropriate    Co-evaluation                 AM-PAC OT "6 Clicks" Daily Activity     Outcome Measure   Help from another person eating meals?: A Little Help from another person taking care of personal grooming?: A Little Help from another person toileting, which includes using toliet, bedpan, or urinal?: Total Help from another person bathing (including washing, rinsing, drying)?: A Lot Help from another person to put on and taking off regular upper body clothing?: A Little Help from another person to put on and taking off regular lower body clothing?: Total 6 Click Score: 13    End  of Session Equipment Utilized During Treatment: Oxygen  OT Visit Diagnosis: Other abnormalities of gait and mobility (R26.89);Muscle weakness (generalized) (M62.81);Pain;Other symptoms and signs involving cognitive function Pain - Right/Left: Right Pain - part of body: Leg   Activity Tolerance Patient limited by fatigue   Patient Left in chair;with call bell/phone within reach;with chair alarm set   Nurse Communication Mobility status        Time: 9211-9417 OT Time Calculation (min): 18 min  Charges: OT General Charges $OT Visit: 1 Visit OT Treatments $Self Care/Home Management : 8-22 mins  Barry Brunner, OT Acute Rehabilitation Services Pager [phone] Office [phone]    Chancy Milroy 07/08/2021, 1:11 PM

## 2021-07-08 NOTE — Progress Notes (Addendum)
NAME:  Todd Duran, MRN:  [id number], DOB:  10-05-1946, LOS: 11 ADMISSION DATE:  06/27/2021, CONSULTATION DATE:  8/27 REFERRING MD:  Dr. Myra Gianotti, CHIEF COMPLAINT:   post procedural intubation  History of Present Illness:  Patient is a 75 year old male with PMH of DM T2, HTN, bilateral BKA presents to [facility] on 8/20 with pseudoaneurysm of R femoral artery.  On 7/13 patient received left BKA.  Patient was brought in on 7/29 for right toe amputation.  Right femoral to popliteal artery bypass grafting was done on 8/3.  On 8/13, patient presents with infected right foot and on 8/14 a right BKA was performed.   On 8/16 patient was sent to CIR.  On 8/20 vascular surgery consulted for bleeding on right groin and hemoglobin dropped to 6.9.  CT showing pseudoaneurysm right femoral artery.  On 8/20 vascular surgery performed surgery to explore pseudoaneurysm and repair of bovine pericardial patch angioplasty.  Wound VAC in place.  On 8/23, vascular surgery performed revision of right femoral retroperitoneal right iliac artery exposure, redo right femoral artery exposure, and endarterectomy and placement of multiple budgeted sutures for hemostasis.  Right femoral Prevena wound VAC postop cultures show Pseudomonas aeruginosa.  Started on cefepime.  On 8/26, acute bleed from right groin with VAC output of greater than 500 mL.  Performed right groin exploration and evacuation of right groin hematoma.  On 8/27, patient brought back to the OR for reexploration of right groin, ligation of right femoropopliteal vein bypass, ligation of right external iliac artery, excision of right bovine pericardial patch with excision of right common femoral artery, and negative pressure drain to right groin.  Patient remains intubated postprocedure due to unable to wean off ventilator.  PCCM consulted for ventilator management.   Pertinent  Medical History   Past Medical History:  Diagnosis Date   Diabetes mellitus without  complication (HCC)    Hypertension    Significant Hospital Events: Including procedures, antibiotic start and stop dates in addition to other pertinent events   8/27: right groin re-exploration and evacuation of hematoma, very friable vessels, ultimately opted for ligation.  8/28 more comfortable. No signs of bleeding.  But ended up on BIPAP; 2/2 edema seemed to respond to edema  8/29 adding back home meds. Palliative consulted by primary team.  Echocardiogram: Ejection fraction 30 to 35%, global hypokinesis, LV mildly dilated.  Diastolic parameters consistent with grade 2 diastolic dysfunction.  RV size normal, RV function normal.  Lactate elevated at 4 8/30: Quiet evening.  Blood pressure under control.  Lactate cleared to 0.7 bedside and assessment of lower limb, seemed a little bit warmer on exam.  No distress. Overnight patient got SOB. Pulm edema given lasix and placed on bipap  Interim History / Subjective:   Patient w/ increasing SOB overnight, tachy, and wheezing; given neb, lasix, and placed on bipap. CXR indicative of pulm edema. Patient is DNR/DNI. Palliative care following.  This morning patient removed oxygen and had increased WOB. Oxygen placed back on and provided some relief. BS with crackles bilaterally.    Objective   Blood pressure 116/74, pulse 95, temperature 98.8 F (37.1 C), temperature source Oral, resp. rate (!) 21, height 4\' 5"  (1.346 m), weight 57.7 kg, SpO2 100 %.        Intake/Output Summary (Last 24 hours) at 07/08/2021 0705 Last data filed at 07/08/2021 0600 Gross per 24 hour  Intake 1836.08 ml  Output 2173 ml  Net -336.92 ml    07/10/2021  07/05/21 0550 07/06/21 0300 07/07/21 0630  Weight: 60.2 kg 58.2 kg 57.7 kg    Examination: General:  ill appearing on mech vent HEENT: MM pink/moist; [place] in place Neuro: Aox3 CV: s1s2, RRR, no m/r/g PULM:  dim crackles BS bilaterally; 6 l/m [facility] GI: soft, bsx4 active  Extremities: warm/dry, bilateral  BKA Skin: no rashes or lesions   Labs: WBC 16.4 (25.2), hgb 8.5 CXR 8/30: mild interstitial edema w/ small bilateral pleural effusions; R>L   Resolved Hospital Problem list      Assessment & Plan:  Acute respiratory failure with hypoxia 2/2 pulmonary edema s/p re-exploration of right groin-extubated 8/27. Developed Progressive respiratory distress in the evening hrs treated w/ NIPPV and diuresis. Favor pulmonary edema responded to diuresis P: -We will give IV Lasix this morning -Place patient on BiPAP this morning; will consider giving a break off BiPAP later today to nasal cannula -Wean FiO2 for sats greater than 92% -Pulm telemetry: IS, OOB as tolerated  HTN. W/ known h/o HTN  Newly identified systolic heart failure (EF 30 to 35% with global hypokinesis) favor T wave inversion on 12 lead w/ troponin elevation the result of demand ischemia: Given his multiple comorbidities he is not a candidate for left heart catheterization or any further aggressive therapy P: -Increasing Lasix IV 40 BID -Continue Coreg -will stop losartan and start entresto tomorrow morning -Continue statin, Plavix, and ASA -Consider adding spironolactone and Entresto when BP allows -Consider cards consult -Trend BMP/UOP   S/P ligation of right femoropopliteal vein bypass ligation of right external iliac artery, excision of right bovine pericardial patch with excision of right common femoral artery & negative pressure drain to right groin  P: -Per vascular surgery -Consider further amputation per vascular surgery  Pseudomonas aeruginosa Right femoral Prevena wound   P: -Continue cefepime (started 8/25) with plan to complete 4 to 6 weeks   Fluid and electrolyte imbalance: Mild hypophosphatemia, P: -Trend replete electrolytes as needed   Acute blood loss anemia: stable P: -Trend CBC -Replete for hemoglobin less than 7   T2DM - adequate controlled.  P: -SSI   Best Practice (right click and  "Reselect all SmartList Selections" daily)   Diet/type: clear liquids advance as tolerated  DVT prophylaxis: other defer to primary ->seems like we can add prophylactic soon  GI prophylaxis: PPI Lines: Central line Foley:  Yes, and it is still needed Code Status:  full code Last date of multidisciplinary goals of care discussion [pending]   Critical Care time: 35 minutes   JD Anselm Lis  Pulmonary & Critical Care 07/08/2021, 8:19 AM  Please see [email or web address] for pager details.  From 7A-7P if no response, please call [phone]. After hours, please call ELink [phone].

## 2021-07-09 ENCOUNTER — Inpatient Hospital Stay (HOSPITAL_COMMUNITY): Payer: No Typology Code available for payment source

## 2021-07-09 ENCOUNTER — Other Ambulatory Visit (HOSPITAL_COMMUNITY): Payer: Self-pay

## 2021-07-09 DIAGNOSIS — J9602 Acute respiratory failure with hypercapnia: Secondary | ICD-10-CM | POA: Diagnosis not present

## 2021-07-09 LAB — PHOSPHORUS: Phosphorus: 2.7 mg/dL (ref 2.5–4.6)

## 2021-07-09 LAB — CBC
HCT: 26.6 % — ABNORMAL LOW (ref 39.0–52.0)
Hemoglobin: 8.4 g/dL — ABNORMAL LOW (ref 13.0–17.0)
MCH: 28.4 pg (ref 26.0–34.0)
MCHC: 31.6 g/dL (ref 30.0–36.0)
MCV: 89.9 fL (ref 80.0–100.0)
Platelets: 354 10*3/uL (ref 150–400)
RBC: 2.96 MIL/uL — ABNORMAL LOW (ref 4.22–5.81)
RDW: 15.6 % — ABNORMAL HIGH (ref 11.5–15.5)
WBC: 14 10*3/uL — ABNORMAL HIGH (ref 4.0–10.5)
nRBC: 0 % (ref 0.0–0.2)

## 2021-07-09 LAB — BASIC METABOLIC PANEL
Anion gap: 7 (ref 5–15)
Anion gap: 5 (ref 5–15)
BUN: 19 mg/dL (ref 8–23)
BUN: 19 mg/dL (ref 8–23)
CO2: 26 mmol/L (ref 22–32)
CO2: 28 mmol/L (ref 22–32)
Calcium: 8.3 mg/dL — ABNORMAL LOW (ref 8.9–10.3)
Calcium: 8.7 mg/dL — ABNORMAL LOW (ref 8.9–10.3)
Chloride: 102 mmol/L (ref 98–111)
Chloride: 102 mmol/L (ref 98–111)
Creatinine, Ser: 0.65 mg/dL (ref 0.61–1.24)
Creatinine, Ser: 0.73 mg/dL (ref 0.61–1.24)
GFR, Estimated: >60 mL/min (ref >60)
GFR, Estimated: >60 mL/min (ref >60)
Glucose, Bld: 163 mg/dL — ABNORMAL HIGH (ref 70–99)
Glucose, Bld: 178 mg/dL — ABNORMAL HIGH (ref 70–99)
Potassium: 3.6 mmol/L (ref 3.5–5.1)
Potassium: 4.3 mmol/L (ref 3.5–5.1)
Sodium: 135 mmol/L (ref 135–145)
Sodium: 135 mmol/L (ref 135–145)

## 2021-07-09 LAB — GLUCOSE, CAPILLARY
Glucose-Capillary: 129 mg/dL — ABNORMAL HIGH (ref 70–99)
Glucose-Capillary: 147 mg/dL — ABNORMAL HIGH (ref 70–99)
Glucose-Capillary: 165 mg/dL — ABNORMAL HIGH (ref 70–99)
Glucose-Capillary: 92 mg/dL (ref 70–99)

## 2021-07-09 LAB — MAGNESIUM: Magnesium: 1.6 mg/dL — ABNORMAL LOW (ref 1.7–2.4)

## 2021-07-09 LAB — BRAIN NATRIURETIC PEPTIDE: B Natriuretic Peptide: 1997.1 pg/mL — ABNORMAL HIGH (ref 0.0–100.0)

## 2021-07-09 MED ORDER — POTASSIUM CHLORIDE CRYS ER 20 MEQ PO TBCR
40.0000 meq | EXTENDED_RELEASE_TABLET | Freq: Once | ORAL | Status: AC
  Administered 2021-07-09: 40 meq via ORAL
  Filled 2021-07-09: qty 2

## 2021-07-09 MED ORDER — MAGNESIUM SULFATE 4 GM/100ML IV SOLN
4.0000 g | Freq: Once | INTRAVENOUS | Status: AC
  Administered 2021-07-09: 4 g via INTRAVENOUS
  Filled 2021-07-09: qty 100

## 2021-07-09 MED ORDER — ALUM & MAG HYDROXIDE-SIMETH 200-200-20 MG/5ML PO SUSP
15.0000 mL | ORAL | Status: DC | PRN
Start: 2021-07-09 — End: 2021-07-27

## 2021-07-09 MED ORDER — SENNOSIDES 8.8 MG/5ML PO SYRP
10.0000 mL | ORAL_SOLUTION | Freq: Every day | ORAL | Status: DC
  Administered 2021-07-09 – 2021-07-16 (×7): 10 mL via ORAL
  Filled 2021-07-09 (×10): qty 10

## 2021-07-09 NOTE — Progress Notes (Signed)
NAME:  Todd Duran, MRN:  [id number], DOB:  [birth year], LOS: 12 ADMISSION DATE:  06/27/2021, CONSULTATION DATE:  8/27 REFERRING MD:  Dr. Myra Gianotti, CHIEF COMPLAINT:   post procedural intubation  History of Present Illness:  Patient is a 75 year old male with PMH of DM T2, HTN, bilateral BKA presents to [facility] on 8/20 with pseudoaneurysm of R femoral artery.  On 7/13 patient received left BKA.  Patient was brought in on 7/29 for right toe amputation.  Right femoral to popliteal artery bypass grafting was done on 8/3.  On 8/13, patient presents with infected right foot and on 8/14 a right BKA was performed.   On 8/16 patient was sent to CIR.  On 8/20 vascular surgery consulted for bleeding on right groin and hemoglobin dropped to 6.9.  CT showing pseudoaneurysm right femoral artery.  On 8/20 vascular surgery performed surgery to explore pseudoaneurysm and repair of bovine pericardial patch angioplasty.  Wound VAC in place.  On 8/23, vascular surgery performed revision of right femoral retroperitoneal right iliac artery exposure, redo right femoral artery exposure, and endarterectomy and placement of multiple budgeted sutures for hemostasis.  Right femoral Prevena wound VAC postop cultures show Pseudomonas aeruginosa.  Started on cefepime.  On 8/26, acute bleed from right groin with VAC output of greater than 500 mL.  Performed right groin exploration and evacuation of right groin hematoma.  On 8/27, patient brought back to the OR for reexploration of right groin, ligation of right femoropopliteal vein bypass, ligation of right external iliac artery, excision of right bovine pericardial patch with excision of right common femoral artery, and negative pressure drain to right groin.  Patient remains intubated postprocedure due to unable to wean off ventilator.  PCCM consulted for ventilator management.   Pertinent  Medical History   Past Medical History:  Diagnosis Date   Diabetes mellitus without  complication (HCC)    Hypertension    Significant Hospital Events: Including procedures, antibiotic start and stop dates in addition to other pertinent events   8/27: right groin re-exploration and evacuation of hematoma, very friable vessels, ultimately opted for ligation.  8/28 more comfortable. No signs of bleeding.  But ended up on BIPAP; 2/2 edema seemed to respond to edema  8/29 adding back home meds. Palliative consulted by primary team.  Echocardiogram: Ejection fraction 30 to 35%, global hypokinesis, LV mildly dilated.  Diastolic parameters consistent with grade 2 diastolic dysfunction.  RV size normal, RV function normal.  Lactate elevated at 4 8/30: Quiet evening.  Blood pressure under control.  Lactate cleared to 0.7 bedside and assessment of lower limb, seemed a little bit warmer on exam.  No distress. Overnight patient got SOB. Pulm edema given lasix and placed on bipap 8/31: patient got sob with pulm edema. Placed on bipap for a few hours and lasix given.  Interim History / Subjective:   Patient off Bipap most of the day yesterday and was placed back on BiPAP overnight for a few hours K 3.6, Mag 1.6 (both being repleted) SBP 120-130s UOP -2,575  This morning patient is only on 2 l/m [place] in NAD Patient states he feels fine Does feel a little constipated   Objective   Blood pressure 129/86, pulse (!) 105, temperature 98.1 F (36.7 C), temperature source Oral, resp. rate 17, height 4\' 5"  (1.346 m), weight 57.7 kg, SpO2 100 %. CVP:  [2 mmHg-10 mmHg] 10 mmHg  Vent Mode: PCV;BIPAP FiO2 (%):  [40 %] 40 % Set Rate:   bmp] 15 bmp PEEP:  [5 cmH20] 5 cmH20   Intake/Output Summary (Last 24 hours) at 07/09/2021 0715 Last data filed at 07/09/2021 0000 Gross per 24 hour  Intake 360 ml  Output 2575 ml  Net -2215 ml    Filed Weights   07/05/21 0550 07/06/21 0300 07/07/21 0630  Weight: 60.2 kg 58.2 kg 57.7 kg    Examination: General:  NAD HEENT: MM pink/moist; [place] in  place Neuro: Aox3 CV: s1s2, sinus tach, no m/r/g PULM:  dim BS with crackles in bases bilaterally; 2 l/m [place] sats 99% GI: soft, no pain to palpation Extremities: warm/dry, bilateral BKA; no redness, tenderness, or drainage Skin: no rashes or lesions   Labs:  K 3.6, Mag 1.6 (both being repleted) WBC 14 (16.4), Hgb 8.4 CXR: pulm edema still present w/ small bilateral pleural effusions    Resolved Hospital Problem list      Assessment & Plan:  Acute respiratory failure with hypoxia 2/2 pulmonary edema s/p re-exploration of right groin-extubated 8/27. Developed Progressive respiratory distress in the evening hrs treated w/ NIPPV and diuresis. Favor pulmonary edema responded to diuresis P: -continue IV lasix -continue supplemental oxygen and wean for sats >92% -Bipap on standby for SOB -Pulm toilet: IS, OOB, PT/OT, flutter -trend CXR -PCCM may be able to sign off to [facility] tomorrow if patient is able to remain off BiPAP   HTN. W/ known h/o HTN  Newly identified systolic heart failure (EF 30 to 35% with global hypokinesis) favor T wave inversion on 12 lead w/ troponin elevation the result of demand ischemia: Given his multiple comorbidities he is not a candidate for left heart catheterization or any further aggressive therapy P: -continue Lasix IV 40 BID -Continue Coreg; consider increasing if HR remains elevated -Entresto to start this am -Continue statin, Plavix, and ASA -Consider adding spironolactone when BP allows -Consider cards consult -Trend BMP/Mag/UOP   S/P ligation of right femoropopliteal vein bypass ligation of right external iliac artery, excision of right bovine pericardial patch with excision of right common femoral artery & negative pressure drain to right groin  P: -Per vascular surgery  Pseudomonas aeruginosa Right femoral Prevena wound   P: -Continue cefepime (started 8/25) with plan to complete 4 to 6 weeks   Fluid and electrolyte imbalance: Mild  hypophosphatemia, Hypomagnesemia Hypokalemia P: -currently repleting mag and K -will recheck BMP this afternoon -continue to trend BMP, Mag, phosph   Acute blood loss anemia: stable P: -Trend CBC -Replete for hemoglobin less than 7   T2DM - adequate controlled.  P: -SSI  GOC P: -Palliative care following -patient is DNR/DNI     Best Practice (right click and "Reselect all SmartList Selections" daily)   Diet/type: full liquids  advance as tolerated  DVT prophylaxis: other defer to primary ->seems like we can add prophylactic soon  GI prophylaxis: PPI Lines: Central line Foley:  Yes, and it is still needed Code Status:  full code Last date of multidisciplinary goals of care discussion [patient updated at bedside on 9/1]   Critical Care time: 35 minutes   JD Anselm Lis [facility] 07/09/2021, 7:15 AM  Please see [email or web address] for pager details.  From 7A-7P if no response, please call [phone]. After hours, please call ELink [phone].

## 2021-07-09 NOTE — Progress Notes (Signed)
K+ 3.6, mg 1.6 Replaced per protocol

## 2021-07-09 NOTE — Progress Notes (Addendum)
   VASCULAR SURGERY ASSESSMENT & PLAN:   PAD: multiple procedures including stenting right CIA and EIA 07/04/2021:  Ligation of right femoropopliteal vein bypass, ligation of right external iliac artery, excision of right bovine pericardial patch with excision of right common femoral artery 06/21/2021: Right BKA 05/20/2021: Left AKA  Pain and ischemia of right residual limb. Afebrile. WBC: 14k>> downward trend. May ultimately need conversion to AKA. On cefepime. Plavix, statin and aspirin continue. Hgb stable.  Respiratory failure/HF/pulmonary edema: diuresis per CCM/TRH. UOP: 2900 cc. Normal SaO2 on 2L [place]. Appreciate management.  DM: good control currently  SUBJECTIVE:   Right residual limb pain controlled with Tramadol, Tylenol and morphine PRN.  PHYSICAL EXAM:   Vitals:   07/09/21 0200 07/09/21 0300 07/09/21 0400 07/09/21 0500  BP: 130/74 128/84 134/85 129/86  Pulse: (!) 108 (!) 105  (!) 105  Resp: 19 18 18 17   Temp:   98.9 F (37.2 C) 98.1 F (36.7 C)  TempSrc:   Oral Oral  SpO2: 100% 100%  100%  Weight:      Height:       General appearance: Awake, alert and oriented in no apparent distress Cardiac: Heart rate and rhythm are regular Respirations: Mild dyspnea with conversation Extremities: Right amputation site incision is well approximated with skin discoloration and cool to touch to knee. Left AKA well healed. 2+ left femoral pulse. Right groin and RLQ incisions are well approximated with small amount of bloody drainage from groin.    LABS:   Lab Results  Component Value Date   WBC 14.0 (H) 07/09/2021   HGB 8.4 (L) 07/09/2021   HCT 26.6 (L) 07/09/2021   MCV 89.9 07/09/2021   PLT 354 07/09/2021   Lab Results  Component Value Date   CREATININE 0.65 07/09/2021   Lab Results  Component Value Date   INR 1.2 06/30/2021   CBG (last 3)  Recent Labs    07/08/21 1610 07/08/21 2127 07/09/21 0639  GLUCAP 161* 63* 129*    PROBLEM LIST:    Principal Problem:    Acute hypercapnic respiratory failure (HCC) Active Problems:   Peripheral artery disease (HCC)   Pseudoaneurysm (HCC)   Encounter for postanesthesia care   Acute blood loss anemia   Pseudomonas infection   Wound infection after surgery   Malnutrition of moderate degree   Acute respiratory failure with hypoxia (HCC)   Pulmonary edema   CURRENT MEDS:    aspirin  81 mg Oral Daily   atorvastatin  40 mg Oral QHS   carvedilol  6.25 mg Oral BID WC   Chlorhexidine Gluconate Cloth  6 each Topical Q0600   clopidogrel  75 mg Oral Daily   docusate sodium  100 mg Oral BID   feeding supplement  237 mL Oral BID BM   furosemide  40 mg Intravenous BID   gabapentin  100 mg Oral TID   insulin aspart  0-15 Units Subcutaneous TID AC & HS   mouth rinse  15 mL Mouth Rinse BID   methocarbamol  750 mg Oral QID   multivitamin with minerals  1 tablet Oral Daily   pantoprazole  40 mg Oral Daily   polyethylene glycol  17 g Oral Daily   sacubitril-valsartan  1 tablet Oral BID   sodium chloride flush  10-40 mL Intracatheter Q12H    09/08/21, PA-C  Office: [phone] 07/09/2021   I agree with the above.  I have seen and examined the patient.  09/08/2021

## 2021-07-09 NOTE — Progress Notes (Signed)
This chaplain responded to PMT consult for spiritual care and prayer.  The chaplain is updated by the Pt. RN-Janice before the visit.  The chaplain introduced herself to the Pt. and understands the Pt. desires the visit.  The chaplain sat with the Pt. in silence.   The Pt. smiled and accepted the chaplain's  invitation for prayer and F/U spiritual care.

## 2021-07-10 DIAGNOSIS — I5021 Acute systolic (congestive) heart failure: Secondary | ICD-10-CM

## 2021-07-10 DIAGNOSIS — T8789 Other complications of amputation stump: Secondary | ICD-10-CM

## 2021-07-10 LAB — GLUCOSE, CAPILLARY
Glucose-Capillary: 98 mg/dL (ref 70–99)
Glucose-Capillary: 187 mg/dL — ABNORMAL HIGH (ref 70–99)
Glucose-Capillary: 123 mg/dL — ABNORMAL HIGH (ref 70–99)
Glucose-Capillary: 112 mg/dL — ABNORMAL HIGH (ref 70–99)

## 2021-07-10 LAB — BASIC METABOLIC PANEL
Anion gap: 6 (ref 5–15)
BUN: 18 mg/dL (ref 8–23)
CO2: 26 mmol/L (ref 22–32)
Calcium: 8.7 mg/dL — ABNORMAL LOW (ref 8.9–10.3)
Chloride: 101 mmol/L (ref 98–111)
Creatinine, Ser: 0.64 mg/dL (ref 0.61–1.24)
GFR, Estimated: >60 mL/min (ref >60)
Glucose, Bld: 98 mg/dL (ref 70–99)
Potassium: 3.8 mmol/L (ref 3.5–5.1)
Sodium: 133 mmol/L — ABNORMAL LOW (ref 135–145)

## 2021-07-10 LAB — MAGNESIUM: Magnesium: 2 mg/dL (ref 1.7–2.4)

## 2021-07-10 LAB — PHOSPHORUS: Phosphorus: 3.2 mg/dL (ref 2.5–4.6)

## 2021-07-10 MED ORDER — POTASSIUM CHLORIDE CRYS ER 20 MEQ PO TBCR
40.0000 meq | EXTENDED_RELEASE_TABLET | Freq: Once | ORAL | Status: AC
  Administered 2021-07-10: 40 meq via ORAL
  Filled 2021-07-10: qty 2

## 2021-07-10 NOTE — Consult Note (Signed)
WOC Nurse Consult Note: Consult completed via ELink camera with primary RN, Liborio Nixon, in the room. Reason for Consult: "eval sacral wound" Wound type: Liborio Nixon reports the patient had a DTPI to the area in July of this year. Today, it is an evolving DTPI by loss of superficial overlying tissue and presents as a stage 2 PI. Pressure Injury POA: Yes Measurement: To be provided by the bedside RN in the flowsheet section  Wound bed: 100% pink Drainage (amount, consistency, odor) none Periwound: fragile, beginning to peel off Dressing procedure/placement/frequency:  Cover the sacral wound and surrounding fragile tissue with Xeroform gauze [place] [phone]) or vaseline gauze, then a foam dressing.  The gauze needs to be changed every day, the foam can stay in place up to 3 days. Write the date/time on the foam dressing.  Monitor the wound area(s) for worsening of condition such as: Signs/symptoms of infection,  Increase in size,  Development of or worsening of odor, Development of pain, or increased pain at the affected locations.  Notify the medical team if any of these develop.  Thank you for the consult.  Discussed plan of care with the patient and bedside nurse.  WOC nurse will not follow at this time.  Please re-consult the WOC team if needed.  Helmut Muster, RN, MSN, CWOCN, CNS-BC, pager [phone]

## 2021-07-10 NOTE — Plan of Care (Signed)
  Problem: Education: Goal: Knowledge of General Education information will improve Description: Including pain rating scale, medication(s)/side effects and non-pharmacologic comfort measures Outcome: Progressing   Problem: Health Behavior/Discharge Planning: Goal: Ability to manage health-related needs will improve Outcome: Progressing   Problem: Clinical Measurements: Goal: Ability to maintain clinical measurements within normal limits will improve Outcome: Progressing Goal: Will remain free from infection Outcome: Progressing Goal: Diagnostic test results will improve Outcome: Progressing Goal: Respiratory complications will improve Outcome: Progressing Goal: Cardiovascular complication will be avoided Outcome: Progressing   Problem: Activity: Goal: Risk for activity intolerance will decrease Outcome: Progressing   Problem: Nutrition: Goal: Adequate nutrition will be maintained Outcome: Progressing   Problem: Coping: Goal: Level of anxiety will decrease Outcome: Progressing   Problem: Elimination: Goal: Will not experience complications related to bowel motility Outcome: Progressing Goal: Will not experience complications related to urinary retention Outcome: Progressing   Problem: Pain Managment: Goal: General experience of comfort will improve Outcome: Progressing   Problem: Safety: Goal: Ability to remain free from injury will improve Outcome: Progressing   Problem: Skin Integrity: Goal: Risk for impaired skin integrity will decrease Outcome: Progressing   Problem: Education: Goal: Knowledge of the prescribed therapeutic regimen will improve Outcome: Progressing Goal: Ability to verbalize activity precautions or restrictions will improve Outcome: Progressing Goal: Understanding of discharge needs will improve Outcome: Progressing   Problem: Clinical Measurements: Goal: Postoperative complications will be avoided or minimized Outcome: Progressing    Problem: Skin Integrity: Goal: Demonstration of wound healing without infection will improve Outcome: Progressing   Problem: Activity: Goal: Ability to tolerate increased activity will improve Outcome: Progressing   Problem: Respiratory: Goal: Ability to maintain a clear airway and adequate ventilation will improve Outcome: Progressing   Problem: Role Relationship: Goal: Method of communication will improve Outcome: Progressing

## 2021-07-10 NOTE — Progress Notes (Signed)
[facility] PROGRESS NOTE   Todd Duran IOE:[id number] DOB: 1946-03-18 DOA: 06/27/2021  PCP: Pcp, No  Brief History/Interval Summary: Patient is a 75 year old male with PMH of DM T2, HTN, bilateral BKA presents to [facility] on 8/20 with pseudoaneurysm of R femoral artery. On 7/13 patient received left BKA.  Patient was brought in on 7/29 for right toe amputation.  Right femoral to popliteal artery bypass grafting was done on 8/3.  On 8/13, patient presents with infected right foot and on 8/14 a right BKA was performed.   On 8/16 patient was sent to CIR.  On 8/20 vascular surgery consulted for bleeding on right groin and hemoglobin dropped to 6.9.  CT showing pseudoaneurysm right femoral artery.  On 8/20 vascular surgery performed surgery to explore pseudoaneurysm and repair of bovine pericardial patch angioplasty.  Wound VAC in place.  On 8/23, vascular surgery performed revision of right femoral retroperitoneal right iliac artery exposure, redo right femoral artery exposure, and endarterectomy and placement of multiple budgeted sutures for hemostasis.  Right femoral Prevena wound VAC postop cultures show Pseudomonas aeruginosa.  Started on cefepime.  On 8/26, acute bleed from right groin with VAC output of greater than 500 mL.  Performed right groin exploration and evacuation of right groin hematoma.  On 8/27, patient brought back to the OR for reexploration of right groin, ligation of right femoropopliteal vein bypass, ligation of right external iliac artery, excision of right bovine pericardial patch with excision of right common femoral artery, and negative pressure drain to right groin.  Patient was in the intensive care unit on mechanical ventilation.  Seen by the critical care medicine team.  Extubated.  However developed fluid overload for which she required BiPAP for a few days.  Seems to be stable.  Significant events: 8/27: right groin re-exploration and evacuation of hematoma, very  friable vessels, ultimately opted for ligation.  8/28 more comfortable. No signs of bleeding.  But ended up on BIPAP; 2/2 edema seemed to respond to edema  8/29 adding back home meds. Palliative consulted by primary team.  Echocardiogram: Ejection fraction 30 to 35%, global hypokinesis, LV mildly dilated.  Diastolic parameters consistent with grade 2 diastolic dysfunction.  RV size normal, RV function normal.  Lactate elevated at 4 8/30: Quiet evening.  Blood pressure under control.  Lactate cleared to 0.7 bedside and assessment of lower limb, seemed a little bit warmer on exam.  No distress. Overnight patient got SOB. Pulm edema given lasix and placed on bipap 8/31: patient got sob with pulm edema. Placed on bipap for a few hours and lasix given.  Antibiotics: Anti-infectives (From admission, onward)    Start     Dose/Rate Route Frequency Ordered Stop   07/02/21 1300  ceFEPIme (MAXIPIME) 2 g in sodium chloride 0.9 % 100 mL IVPB        2 g 200 mL/hr over 30 Minutes Intravenous Every 12 hours 07/02/21 1201 08/16/21 1259   06/28/21 0200  ceFAZolin (ANCEF) IVPB 2g/100 mL premix        2 g 200 mL/hr over 30 Minutes Intravenous Every 8 hours 06/27/21 2320 06/28/21 1002       Subjective/Interval History: Patient mentions that he feels well this morning.  Denies any chest pain or shortness of breath.  No nausea or vomiting.  Discussed with nursing staff overnight.  He was placed on BiPAP for 4hours as per orders.  Patient was not particularly symptomatic, they placed him on the BiPAP.  Assessment/Plan:  Acute respiratory failure with hypoxia Most likely due to pulmonary edema from systolic CHF.  Required BiPAP for few days.  Was aggressively diuresed with improvement.  He is saturating normal on room air.  Change BiPAP to as needed.  Okay for transfer to progressive unit.  Continue intensive spirometry.  Out of bed to chair.  Mobilization.  Acute systolic CHF Appears to be a new  diagnosis.  Echocardiogram showed EF to be 30 to 35% with global hypokinesis.  Troponin elevation was noted which was thought to be due to demand ischemia.  Improving with IV Lasix which is being continued.  Patient is on aspirin, statin, carvedilol, Entresto.  He will benefit from being seen by cardiology but this could potentially be done in the outpatient setting.  If vascular surgery decides to take the patient back to the OR there might be benefit in involving cardiology in the hospital.  Continue strict ins and outs and daily weights. Monitor electrolytes.  Essential hypertension Monitor blood pressures closely.  Peripheral artery disease/status post recent right below-knee amputation Patient being followed by vascular surgery.  Had bleeding complications requiring ligation of the right femoral-popliteal vein bypass ligation of right external iliac artery and excision of right bovine pericardial patch with excision of right common femoral artery.  Has been stable.  Will check hemoglobin tomorrow.  Further management per vascular surgery.  They feel the patient may need more surgery depending on how the right lower extremity stump heals.  Pseudomonas infection right femoral area Cefepime was started on 8/25.  Plan is to give it for 4 to 6 weeks per CCM notes.  Might benefit from discussing this with ID as well. WBC has been improving.  Remains afebrile.  Patient has a PICC line in his right arm which was placed on 8/28.  Acute blood loss anemia Hemoglobin has been stable for the last several days.  We will recheck tomorrow.  Diabetes mellitus type 2, controlled Monitor CBGs.  SSI.  Goals of care Seen by palliative care.  Patient is DNR.  Sacral decubitus stage II Pressure Injury 06/05/21 Coccyx Mid;Other (Comment) Stage 2 -  Partial thickness loss of dermis presenting as a shallow open injury with a red, pink wound bed without slough. (Active)  06/05/21 2200  Location: Coccyx  Location  Orientation: Mid;Other (Comment) (coccyx)  Staging: Stage 2 -  Partial thickness loss of dermis presenting as a shallow open injury with a red, pink wound bed without slough.  Wound Description (Comments):   Present on Admission: Yes   Obesity Estimated body mass index is 31.84 kg/m as calculated from the following:   Height as of this encounter: 4\' 5"  (1.346 m).   Weight as of this encounter: 57.7 kg.  Moderate protein calorie malnutrition Nutrition Problem: Moderate Malnutrition Etiology: chronic illness (PVD/PAD)  Signs/Symptoms: moderate fat depletion, severe muscle depletion  Interventions: Ensure Enlive (each supplement provides 350kcal and 20 grams of protein)   DVT Prophylaxis: Being deferred to primary service.   Medications: Scheduled:  aspirin  81 mg Oral Daily   atorvastatin  40 mg Oral QHS   carvedilol  6.25 mg Oral BID WC   Chlorhexidine Gluconate Cloth  6 each Topical Q0600   clopidogrel  75 mg Oral Daily   docusate sodium  100 mg Oral BID   feeding supplement  237 mL Oral BID BM   furosemide  40 mg Intravenous BID   gabapentin  100 mg Oral TID   insulin aspart  0-15 Units Subcutaneous TID AC & HS   mouth rinse  15 mL Mouth Rinse BID   methocarbamol  750 mg Oral QID   multivitamin with minerals  1 tablet Oral Daily   pantoprazole  40 mg Oral Daily   polyethylene glycol  17 g Oral Daily   sacubitril-valsartan  1 tablet Oral BID   sennosides  10 mL Oral QHS   sodium chloride flush  10-40 mL Intracatheter Q12H   Continuous:  sodium chloride 10 mL/hr at 07/10/21 0800   ceFEPime (MAXIPIME) IV Stopped (07/10/21 0131)   magnesium sulfate bolus IVPB     JKD:TOIZTI chloride, acetaminophen **OR** acetaminophen, alum & mag hydroxide-simeth, hydrALAZINE, magnesium sulfate bolus IVPB, metoprolol tartrate, morphine injection, ondansetron, phenol, sodium chloride flush, traMADol   Objective:  Vital Signs  Vitals:   07/10/21 0500 07/10/21 0600 07/10/21 0800  07/10/21 0824  BP: 122/73 120/76 120/74   Pulse: 83  (!) 106   Resp: 13 12 14    Temp: 97.6 F (36.4 C)   99.2 F (37.3 C)  TempSrc: Oral   Oral  SpO2: 100%  93%   Weight:      Height:        Intake/Output Summary (Last 24 hours) at 07/10/2021 0908 Last data filed at 07/10/2021 0800 Gross per 24 hour  Intake 1634.46 ml  Output 3275 ml  Net -1640.54 ml   Filed Weights   07/05/21 0550 07/06/21 0300 07/07/21 0630  Weight: 60.2 kg 58.2 kg 57.7 kg    General appearance: Awake alert.  In no distress Resp: Good air entry bilaterally.  Normal effort at rest.  Few crackles at the bases.  No wheezing or rhonchi. Cardio: S1-S2 is normal regular.  No S3-S4.  No rubs murmurs or bruit GI: Abdomen is soft.  Nontender nondistended.  Bowel sounds are present normal.  No masses organomegaly Extremities: Right BKA.  Left AKA. Neurologic: No focal neurological deficits.    Lab Results:  Data Reviewed: I have personally reviewed following labs and imaging studies  CBC: Recent Labs  Lab 07/04/21 0629 07/04/21 0913 07/06/21 0734 07/07/21 0547 07/07/21 1800 07/08/21 0331 07/09/21 0313  WBC 31.9*  --   --  15.5* 25.2* 16.4* 14.0*  HGB 11.3*   < > 8.8* 8.9* 9.5* 8.5* 8.4*  HCT 33.2*   < > 27.9* 27.6* 30.1* 27.0* 26.6*  MCV 86.0  --   --  88.2 91.8 90.6 89.9  PLT 298  --   --  331 462* 366 354   < > = values in this interval not displayed.    Basic Metabolic Panel: Recent Labs  Lab 07/05/21 0323 07/05/21 1416 07/05/21 2052 07/06/21 0001 07/07/21 0547 07/08/21 0331 07/08/21 1606 07/09/21 0313 07/09/21 1414 07/10/21 0434  NA 138  --  137   < > 137 136 134* 135 135 133*  K 3.6  --  4.4   < > 3.5 4.1 4.0 3.6 4.3 3.8  CL 113*  --  110  --  107 107 104 102 102 101  CO2 21*  --  20*  --  22 23 24 26 28 26   GLUCOSE 126*  --  190*  --  116* 141* 173* 163* 178* 98  BUN 16  --  17  --  17 20 20 19 19 18   CREATININE 0.61  --  0.61  --  0.49* 0.67 0.70 0.65 0.73 0.64  CALCIUM 7.9*  --   7.9*  --  8.4* 8.2*  8.4* 8.3* 8.7* 8.7*  MG 2.0 2.1 1.8  --  1.9  --   --  1.6*  --  2.0  PHOS 1.9* 1.8*  --   --  2.4*  --   --  2.7  --  3.2   < > = values in this interval not displayed.    GFR: Estimated Creatinine Clearance: 50.5 mL/min (by C-G formula based on SCr of 0.64 mg/dL).  Liver Function Tests: Recent Labs  Lab 07/04/21 0629  AST 19  ALT 15  ALKPHOS 122  BILITOT 1.9*  PROT 5.2*  ALBUMIN 1.7*      Cardiac Enzymes: Recent Labs  Lab 07/08/21 0754  CKTOTAL 253     CBG: Recent Labs  Lab 07/09/21 0639 07/09/21 1150 07/09/21 1636 07/09/21 2203 07/10/21 0632  GLUCAP 129* 147* 165* 92 98     Recent Results (from the past 240 hour(s))  Aerobic/Anaerobic Culture w Gram Stain (surgical/deep wound)     Status: None   Collection Time: 06/30/21 11:02 AM   Specimen: Groin, Right; Wound  Result Value Ref Range Status   Specimen Description WOUND  Final   Special Requests RIGHT GROIN WOUND SPEC A  Final   Gram Stain   Final    RARE WBC PRESENT,BOTH PMN AND MONONUCLEAR NO ORGANISMS SEEN    Culture   Final    RARE PSEUDOMONAS AERUGINOSA NO ANAEROBES ISOLATED Performed at [facility], [address], [place], [place] [address]    Report Status 07/05/2021 FINAL  Final   Organism ID, Bacteria PSEUDOMONAS AERUGINOSA  Final      Susceptibility   Pseudomonas aeruginosa - MIC*    CEFTAZIDIME 4 SENSITIVE Sensitive     CIPROFLOXACIN <=0.25 SENSITIVE Sensitive     GENTAMICIN <=1 SENSITIVE Sensitive     IMIPENEM 1 SENSITIVE Sensitive     PIP/TAZO 8 SENSITIVE Sensitive     CEFEPIME 2 SENSITIVE Sensitive     * RARE PSEUDOMONAS AERUGINOSA  MRSA Next Gen by PCR, Nasal     Status: None   Collection Time: 07/04/21  8:39 AM   Specimen: Nasal Mucosa; Nasal Swab  Result Value Ref Range Status   MRSA by PCR Next Gen NOT DETECTED NOT DETECTED Final    Comment: (NOTE) The GeneXpert MRSA Assay (FDA approved for NASAL specimens only), is one component of a  comprehensive MRSA colonization surveillance program. It is not intended to diagnose MRSA infection nor to guide or monitor treatment for MRSA infections. Test performance is not FDA approved in patients less than 38 years old. Performed at [facility], [address], [place], [place] [address]   Surgical PCR screen     Status: None   Collection Time: 07/07/21  8:11 AM   Specimen: Nasal Mucosa; Nasal Swab  Result Value Ref Range Status   MRSA, PCR NEGATIVE NEGATIVE Final   Staphylococcus aureus NEGATIVE NEGATIVE Final    Comment: (NOTE) The Xpert SA Assay (FDA approved for NASAL specimens in patients 69 years of age and older), is one component of a comprehensive surveillance program. It is not intended to diagnose infection nor to guide or monitor treatment. Performed at [facility], [address], [place], [place] [address]       Radiology Studies: DG Chest Port 1 View  Result Date: 07/09/2021 CLINICAL DATA:  Shortness of breath.  Respiratory failure. EXAM: PORTABLE CHEST 1 VIEW COMPARISON:  07/07/2021 FINDINGS: Right arm PICC line tip projects over the  cavoatrial junction. Stable cardiomediastinal contours. Bilateral pleural effusions with veil like opacification of the mid and lower lung zones again noted. Mild interstitial edema is stable to improved in the interval. IMPRESSION: 1. Stable to improved mild interstitial edema. 2. Persistent bilateral pleural effusions. Electronically Signed   By: Signa Kell M.D.   On: 07/09/2021 09:12       LOS: 13 days   Ashish Rossetti Rito Ehrlich  [facility] Pager on [email or web address]  07/10/2021, 9:08 AM

## 2021-07-10 NOTE — Progress Notes (Signed)
Pt arrived to the floor, via the bed, alert VSS,. Pt oriented to staff, room and equipment. CHG bath given. We'll continue to monitor.

## 2021-07-10 NOTE — Progress Notes (Addendum)
VASCULAR SURGERY ASSESSMENT & PLAN:   PAD: multiple procedures including stenting right CIA and EIA 07/04/2021:  Ligation of right femoropopliteal vein bypass, ligation of right external iliac artery, excision of right bovine pericardial patch with excision of right common femoral artery 06/21/2021: Right BKA 05/20/2021: Left AKA   Pain and ischemia of right residual limb. Pain controlled. Afebrile.  May ultimately need conversion to AKA. On cefepime. Plavix, statin and aspirin continue. Transfer to 4E.   Respiratory failure/HF/pulmonary edema: diuresis per TRH. UOP: 2800 cc. Intermittent BiPAP. SaO2 95% currently on RA.Marland Kitchen Appreciate management.   DM: good control currently   SUBJECTIVE:   Right residual limb pain controlled with Tramadol, Tylenol and morphine PRN.    PHYSICAL EXAM:   Vitals:   07/10/21 0300 07/10/21 0400 07/10/21 0500 07/10/21 0600  BP: 113/73 118/75 122/73 120/76  Pulse: 98 81 83   Resp: 15 12 13 12   Temp:   97.6 F (36.4 C)   TempSrc:   Oral   SpO2: 100% 100% 100%   Weight:      Height:       General appearance: Awake, alert and oriented in no apparent distress Cardiac: Heart rate and rhythm are regular Respirations: Nonlabored Extremities: Right amputation site incision is well approximated with skin discoloration and cool to touch to knee. Left AKA well healed. 2+ left femoral pulse. Right groin and RLQ incisions are well approximated with small amount of bloody drainage from groin.    LABS:   Lab Results  Component Value Date   WBC 14.0 (H) 07/09/2021   HGB 8.4 (L) 07/09/2021   HCT 26.6 (L) 07/09/2021   MCV 89.9 07/09/2021   PLT 354 07/09/2021   Lab Results  Component Value Date   CREATININE 0.64 07/10/2021   Lab Results  Component Value Date   INR 1.2 06/30/2021   CBG (last 3)  Recent Labs    07/09/21 1636 07/09/21 2203 07/10/21 0632  GLUCAP 165* 92 98    PROBLEM LIST:    Principal Problem:   Acute hypercapnic respiratory  failure (HCC) Active Problems:   Peripheral artery disease (HCC)   Pseudoaneurysm (HCC)   Encounter for postanesthesia care   Acute blood loss anemia   Pseudomonas infection   Wound infection after surgery   Malnutrition of moderate degree   Acute respiratory failure with hypoxia (HCC)   Pulmonary edema   CURRENT MEDS:    aspirin  81 mg Oral Daily   atorvastatin  40 mg Oral QHS   carvedilol  6.25 mg Oral BID WC   Chlorhexidine Gluconate Cloth  6 each Topical Q0600   clopidogrel  75 mg Oral Daily   docusate sodium  100 mg Oral BID   feeding supplement  237 mL Oral BID BM   furosemide  40 mg Intravenous BID   gabapentin  100 mg Oral TID   insulin aspart  0-15 Units Subcutaneous TID AC & HS   mouth rinse  15 mL Mouth Rinse BID   methocarbamol  750 mg Oral QID   multivitamin with minerals  1 tablet Oral Daily   pantoprazole  40 mg Oral Daily   polyethylene glycol  17 g Oral Daily   sacubitril-valsartan  1 tablet Oral BID   sennosides  10 mL Oral QHS   sodium chloride flush  10-40 mL Intracatheter Q12H   09/09/21, PA-C  Office: [phone] 07/10/2021   I agree with he above.  I have seen and examined the  patient.  We are continuing to monitor his left BKA which is at high risk for conversion to AKA  Wells Rodarius Kichline

## 2021-07-10 NOTE — Progress Notes (Signed)
Physical Therapy Treatment Patient Details Name: Todd Duran MRN: [id number] DOB: 1946/10/04 Today's Date: 07/10/2021    History of Present Illness Pt is a 75 yo male admitted on 7/29 from CIR due to R lower limb ischemia. Pt underwent R femoral endarterectomy, R iliac & superficial fem art stent, R 5th toe amp. 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 Transferred to Rehab on 8/16.  Back to Acute on 8/20 due to pseudoaneurysm with exploration. Continued active bleeding on 8/21. Wound vac placed 8/21 PM. On 8/27 pt had extensive bleeding from rt groin and taken to OR emergently x 2.  On second surgery pt underwent ligation of external iliac artery and right leg bypass and excised the infected patch in the right common femoral artery. Post op pt remained intubated until extubated 8/28. Pt on bipap until 8/29.  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    PT Comments    The pt was seen for continued progression of upright activity and seated balance exercises. The pt c/o low back pain upon arrival of PT (chronic) and therefore we attempted a few supine exercises and stretches that the pt can use in bed to increase movement and reduce stiffness. The pt presents with decreased ability to complete supine to sit transfers without assist at this time, and required maxA to complete movement with pt resisting assist while also asking for assist during transition. The pt was able to maintain static sitting without assist with BUE support, but was impulsive with changes in position due to c/o continued back pain at times through session. maxA with use of bed pad to complete lateral scoot along EOB, session limited by pt reports of pain and decreased command following at this time.    Follow Up Recommendations  CIR     Equipment Recommendations  Wheelchair (measurements PT);Wheelchair cushion (measurements PT);Other  (comment)    Recommendations for Other Services       Precautions / Restrictions Precautions Precautions: Fall Precaution Comments: L AKA (05/20/2021), R BKA (06/21/2021), confusion Required Braces or Orthoses: Other Brace Other Brace: limb protector Restrictions Weight Bearing Restrictions: Yes RLE Weight Bearing: Non weight bearing LLE Weight Bearing: Non weight bearing    Mobility  Bed Mobility Overal bed mobility: Needs Assistance Bed Mobility: Rolling;Sidelying to Sit;Sit to Sidelying Rolling: Min assist Sidelying to sit: Mod assist;Max assist     Sit to sidelying: Mod assist;+2 for physical assistance General bed mobility comments: minA to complete multiple rolls bilaterally with increased cues for reaching with BUE for bed rails. maxA of 1 to transition to sitting EOB, pt resisting movement but also asking for assistance    Transfers Overall transfer level: Needs assistance Equipment used: None Transfers: Lateral/Scoot Transfers          Lateral/Scoot Transfers: Mod assist General transfer comment: lateral scoot in bed with modA of 2 for safety, OOB transfer deferred due to pt decreased command following and increased pain this session  Ambulation/Gait             General Gait Details: Not able       Balance Overall balance assessment: Needs assistance Sitting-balance support: Feet unsupported;Bilateral upper extremity supported;No upper extremity supported Sitting balance-Leahy Scale: Poor Sitting balance - Comments: pt relies on UE support dynamically Postural control: Posterior lean  Cognition Arousal/Alertness: Awake/alert Behavior During Therapy: Flat affect Overall Cognitive Status: Impaired/Different from baseline Area of Impairment: Problem solving;Following commands;Memory;Attention;Safety/judgement;Awareness                   Current Attention Level: Focused Memory: Decreased  short-term memory Following Commands: Follows one step commands inconsistently;Follows one step commands with increased time Safety/Judgement: Decreased awareness of safety;Decreased awareness of deficits Awareness: Emergent Problem Solving: Slow processing;Decreased initiation;Difficulty sequencing;Requires verbal cues;Requires tactile cues General Comments: pt following 1-step commands intermittently through session. unable to discern if internally distracted by pain, unable to hear commands, or unable to process direction.      Exercises Other Exercises Other Exercises: lateral leaning and return to sitting, pt able to complete wiht ax cues and increased time. x3 each direction Other Exercises: supine twists for low back pain/stiffness Other Exercises: supine hip flexion for R HS stretch    General Comments General comments (skin integrity, edema, etc.): VSS      Pertinent Vitals/Pain Pain Assessment: Faces Faces Pain Scale: Hurts even more Pain Location: low back, buttocks, RLE Pain Descriptors / Indicators: Grimacing;Guarding;Discomfort Pain Intervention(s): Limited activity within patient's tolerance;Monitored during session;Repositioned     PT Goals (current goals can now be found in the care plan section) Acute Rehab PT Goals Patient Stated Goal: none stated PT Goal Formulation: With patient Time For Goal Achievement: 07/12/21 Potential to Achieve Goals: Fair Progress towards PT goals: Progressing toward goals    Frequency    Min 3X/week      PT Plan Current plan remains appropriate       AM-PAC PT "6 Clicks" Mobility   Outcome Measure  Help needed turning from your back to your side while in a flat bed without using bedrails?: A Lot Help needed moving from lying on your back to sitting on the side of a flat bed without using bedrails?: A Lot Help needed moving to and from a bed to a chair (including a wheelchair)?: Total Help needed standing up from a  chair using your arms (e.g., wheelchair or bedside chair)?: Total Help needed to walk in hospital room?: Total Help needed climbing 3-5 steps with a railing? : Total 6 Click Score: 8    End of Session   Activity Tolerance: Patient tolerated treatment well;Patient limited by fatigue Patient left: in bed;with call bell/phone within reach;with bed alarm set;with family/visitor present Nurse Communication: Mobility status PT Visit Diagnosis: Other abnormalities of gait and mobility (R26.89);Pain Pain - Right/Left: Right Pain - part of body: Hip;Leg     Time: 1696-7893 PT Time Calculation (min) (ACUTE ONLY): 31 min  Charges:  $Therapeutic Exercise: 8-22 mins $Therapeutic Activity: 8-22 mins                     Vickki Muff, PT, DPT   Acute Rehabilitation Department Pager #: [phone]   Ronnie Derby 07/10/2021, 5:16 PM

## 2021-07-10 NOTE — Progress Notes (Signed)
Inpatient Rehabilitation Admissions Coordinator   I will follow up next week with pt's therapy and medical progress to assist with dispo planning.  Ottie Glazier, RN, MSN Rehab Admissions Coordinator [phone] 07/10/2021 2:20 PM

## 2021-07-10 NOTE — Progress Notes (Signed)
Pt placed on bipap for the evening. Pt resting comfortably sat 98. RT will cont to monitor.

## 2021-07-11 DIAGNOSIS — T8789 Other complications of amputation stump: Secondary | ICD-10-CM

## 2021-07-11 DIAGNOSIS — T8131XA Disruption of external operation (surgical) wound, not elsewhere classified, initial encounter: Secondary | ICD-10-CM

## 2021-07-11 LAB — BASIC METABOLIC PANEL
Anion gap: 6 (ref 5–15)
BUN: 19 mg/dL (ref 8–23)
CO2: 28 mmol/L (ref 22–32)
Calcium: 8.8 mg/dL — ABNORMAL LOW (ref 8.9–10.3)
Chloride: 99 mmol/L (ref 98–111)
Creatinine, Ser: 0.6 mg/dL — ABNORMAL LOW (ref 0.61–1.24)
GFR, Estimated: >60 mL/min (ref >60)
Glucose, Bld: 113 mg/dL — ABNORMAL HIGH (ref 70–99)
Potassium: 4.2 mmol/L (ref 3.5–5.1)
Sodium: 133 mmol/L — ABNORMAL LOW (ref 135–145)

## 2021-07-11 LAB — CBC
HCT: 25.6 % — ABNORMAL LOW (ref 39.0–52.0)
Hemoglobin: 8.3 g/dL — ABNORMAL LOW (ref 13.0–17.0)
MCH: 29 pg (ref 26.0–34.0)
MCHC: 32.4 g/dL (ref 30.0–36.0)
MCV: 89.5 fL (ref 80.0–100.0)
Platelets: 378 10*3/uL (ref 150–400)
RBC: 2.86 MIL/uL — ABNORMAL LOW (ref 4.22–5.81)
RDW: 15.1 % (ref 11.5–15.5)
WBC: 12 10*3/uL — ABNORMAL HIGH (ref 4.0–10.5)
nRBC: 0 % (ref 0.0–0.2)

## 2021-07-11 LAB — GLUCOSE, CAPILLARY
Glucose-Capillary: 110 mg/dL — ABNORMAL HIGH (ref 70–99)
Glucose-Capillary: 122 mg/dL — ABNORMAL HIGH (ref 70–99)

## 2021-07-11 MED ORDER — CARVEDILOL 6.25 MG PO TABS: 6.2500 mg | ORAL_TABLET | Freq: Once | ORAL | Status: AC

## 2021-07-11 MED ORDER — CARVEDILOL 12.5 MG PO TABS
12.5000 mg | ORAL_TABLET | Freq: Two times a day (BID) | ORAL | Status: DC
  Administered 2021-07-11 – 2021-07-16 (×10): 12.5 mg via ORAL
  Filled 2021-07-11 (×5): qty 1
  Filled 2021-07-11: qty 2
  Filled 2021-07-11 (×4): qty 1

## 2021-07-11 MED ORDER — FUROSEMIDE 10 MG/ML IJ SOLN
40.0000 mg | Freq: Every day | INTRAMUSCULAR | Status: DC
  Administered 2021-07-12 – 2021-07-18 (×6): 40 mg via INTRAVENOUS
  Filled 2021-07-11 (×6): qty 4

## 2021-07-11 NOTE — Progress Notes (Signed)
[facility] PROGRESS NOTE   Todd Duran INO:[id number] DOB: May 30, 1946 DOA: 06/27/2021  PCP: Pcp, No  Brief History/Interval Summary: Patient is a 75 year old male with PMH of DM T2, HTN, bilateral BKA presents to [facility] on 8/20 with pseudoaneurysm of R femoral artery. On 7/13 patient received left BKA.  Patient was brought in on 7/29 for right toe amputation.  Right femoral to popliteal artery bypass grafting was done on 8/3.  On 8/13, patient presents with infected right foot and on 8/14 a right BKA was performed.   On 8/16 patient was sent to CIR.  On 8/20 vascular surgery consulted for bleeding on right groin and hemoglobin dropped to 6.9.  CT showing pseudoaneurysm right femoral artery.  On 8/20 vascular surgery performed surgery to explore pseudoaneurysm and repair of bovine pericardial patch angioplasty.  Wound VAC in place.  On 8/23, vascular surgery performed revision of right femoral retroperitoneal right iliac artery exposure, redo right femoral artery exposure, and endarterectomy and placement of multiple budgeted sutures for hemostasis.  Right femoral Prevena wound VAC postop cultures show Pseudomonas aeruginosa.  Started on cefepime.  On 8/26, acute bleed from right groin with VAC output of greater than 500 mL.  Performed right groin exploration and evacuation of right groin hematoma.  On 8/27, patient brought back to the OR for reexploration of right groin, ligation of right femoropopliteal vein bypass, ligation of right external iliac artery, excision of right bovine pericardial patch with excision of right common femoral artery, and negative pressure drain to right groin.  Patient was in the intensive care unit on mechanical ventilation.  Seen by the critical care medicine team.  Extubated.  However developed fluid overload for which she required BiPAP for a few days.  Seems to be stable.  Significant events: 8/27: right groin re-exploration and evacuation of hematoma, very  friable vessels, ultimately opted for ligation.  8/28 more comfortable. No signs of bleeding.  But ended up on BIPAP; 2/2 edema seemed to respond to edema  8/29 adding back home meds. Palliative consulted by primary team.  Echocardiogram: Ejection fraction 30 to 35%, global hypokinesis, LV mildly dilated.  Diastolic parameters consistent with grade 2 diastolic dysfunction.  RV size normal, RV function normal.  Lactate elevated at 4 8/30: Quiet evening.  Blood pressure under control.  Lactate cleared to 0.7 bedside and assessment of lower limb, seemed a little bit warmer on exam.  No distress. Overnight patient got SOB. Pulm edema given lasix and placed on bipap 8/31: patient got sob with pulm edema. Placed on bipap for a few hours and lasix given.  Antibiotics: Anti-infectives (From admission, onward)    Start     Dose/Rate Route Frequency Ordered Stop   07/02/21 1300  ceFEPIme (MAXIPIME) 2 g in sodium chloride 0.9 % 100 mL IVPB        2 g 200 mL/hr over 30 Minutes Intravenous Every 12 hours 07/02/21 1201 08/16/21 1259   06/28/21 0200  ceFAZolin (ANCEF) IVPB 2g/100 mL premix        2 g 200 mL/hr over 30 Minutes Intravenous Every 8 hours 06/27/21 2320 06/28/21 1002       Subjective/Interval History: Patient mentions that he is feeling better from a shortness of breath standpoint.  Denies any chest pain.  No nausea vomiting.  Does have some pain in the right lower extremity at her surgical site.      Assessment/Plan:  Acute respiratory failure with hypoxia Most likely due to pulmonary edema  from systolic CHF.  Required BiPAP for few days.  Was aggressively diuresed with improvement.  He is saturating normal on room air.  Use BiPAP only if needed.  Continue with incentive spirometry.  Out of bed to chair.  Mobilization.  Has been weaned down to room air with saturations in the 90s.   Acute systolic CHF Appears to be a new diagnosis.  Echocardiogram showed EF to be 30 to 35% with global  hypokinesis.  Troponin elevation was noted which was thought to be due to demand ischemia.   Patient was placed on IV diuretics.  Patient had diuresed well.  Weight has decreased.  He is off of oxygen.  We will change to once a day dosing. Patient is also on aspirin, Plavix, statin, Entresto and carvedilol.  Telemetry shows tachycardia which appears to be irregular at times.  EKG was done which shows sinus tachycardia.  Crease the dose of his carvedilol. Patient's renal function is noted to be normal.  He likely has coronary artery disease.  His EKG is abnormal.  It may be worthwhile getting cardiology input while he is here in the hospital.    Essential hypertension Monitor blood pressures closely.  Stable for the most part.  Peripheral artery disease/status post recent right below-knee amputation Patient being followed by vascular surgery.  Had bleeding complications requiring ligation of the right femoral-popliteal vein bypass ligation of right external iliac artery and excision of right bovine pericardial patch with excision of right common femoral artery.  Has been stable.   Hemoglobin is stable.  On aspirin and Plavix.   Further management per vascular surgery.  They feel the patient may need more surgery depending on how the right lower extremity stump heals.  Pseudomonas infection right femoral area Cefepime was started on 8/25.  Plan is to give it for 4 to 6 weeks per CCM notes. WBC has been improving.  Remains afebrile.  Patient has a PICC line in his right arm which was placed on 8/28. Will eventually need to discuss with ID regarding duration of treatment.  Can be done closer to discharge.  Acute blood loss anemia Hemoglobin has been stable for the last several days.    Diabetes mellitus type 2, controlled Monitor CBGs.  SSI.  Goals of care Seen by palliative care.  Patient is DNR.  Sacral decubitus stage II Pressure Injury 06/05/21 Coccyx Mid;Other (Comment) Stage 2 -  Partial  thickness loss of dermis presenting as a shallow open injury with a red, pink wound bed without slough. (Active)  06/05/21 2200  Location: Coccyx  Location Orientation: Mid;Other (Comment) (coccyx)  Staging: Stage 2 -  Partial thickness loss of dermis presenting as a shallow open injury with a red, pink wound bed without slough.  Wound Description (Comments):   Present on Admission: Yes   Obesity Estimated body mass index is 29.36 kg/m as calculated from the following:   Height as of this encounter: 4\' 5"  (1.346 m).   Weight as of this encounter: 53.2 kg.  Moderate protein calorie malnutrition Nutrition Problem: Moderate Malnutrition Etiology: chronic illness (PVD/PAD)  Signs/Symptoms: moderate fat depletion, severe muscle depletion  Interventions: Ensure Enlive (each supplement provides 350kcal and 20 grams of protein)   DVT Prophylaxis: Being deferred to primary service.   Medications: Scheduled:  aspirin  81 mg Oral Daily   atorvastatin  40 mg Oral QHS   carvedilol  12.5 mg Oral BID WC   Chlorhexidine Gluconate Cloth  6 each Topical Q0600  clopidogrel  75 mg Oral Daily   docusate sodium  100 mg Oral BID   feeding supplement  237 mL Oral BID BM   [START ON 07/12/2021] furosemide  40 mg Intravenous Daily   gabapentin  100 mg Oral TID   insulin aspart  0-15 Units Subcutaneous TID AC & HS   mouth rinse  15 mL Mouth Rinse BID   methocarbamol  750 mg Oral QID   multivitamin with minerals  1 tablet Oral Daily   pantoprazole  40 mg Oral Daily   polyethylene glycol  17 g Oral Daily   sacubitril-valsartan  1 tablet Oral BID   sennosides  10 mL Oral QHS   sodium chloride flush  10-40 mL Intracatheter Q12H   Continuous:  sodium chloride 10 mL/hr at 07/10/21 1900   ceFEPime (MAXIPIME) IV 2 g (07/11/21 0033)   magnesium sulfate bolus IVPB     MPN:TIRWER chloride, acetaminophen **OR** acetaminophen, alum & mag hydroxide-simeth, hydrALAZINE, magnesium sulfate bolus IVPB,  metoprolol tartrate, morphine injection, ondansetron, phenol, sodium chloride flush, traMADol   Objective:  Vital Signs  Vitals:   07/11/21 0034 07/11/21 0337 07/11/21 0452 07/11/21 0824  BP: 120/78 130/83  120/80  Pulse: (!) 108 100  (!) 109  Resp: 14 13  18   Temp: 98.3 F (36.8 C) 98.5 F (36.9 C)  98.1 F (36.7 C)  TempSrc: Oral Oral  Oral  SpO2: 99% 98%  99%  Weight:   53.2 kg   Height:        Intake/Output Summary (Last 24 hours) at 07/11/2021 0945 Last data filed at 07/11/2021 0931 Gross per 24 hour  Intake 607.27 ml  Output 1405 ml  Net -797.73 ml    Filed Weights   07/06/21 0300 07/07/21 0630 07/11/21 0452  Weight: 58.2 kg 57.7 kg 53.2 kg    General appearance: Awake alert.  In no distress Resp: Clear to auscultation bilaterally.  Normal effort Cardio: S1-S2 is normal regular.  No S3-S4.  No rubs murmurs or bruit GI: Abdomen is soft.  Nontender nondistended.  Bowel sounds are present normal.  No masses organomegaly Extremities: Right BKA with cold stump.  Left AKA. Neurologic: No focal neurological deficits.     Lab Results:  Data Reviewed: I have personally reviewed following labs and imaging studies  CBC: Recent Labs  Lab 07/07/21 0547 07/07/21 1800 07/08/21 0331 07/09/21 0313 07/11/21 0500  WBC 15.5* 25.2* 16.4* 14.0* 12.0*  HGB 8.9* 9.5* 8.5* 8.4* 8.3*  HCT 27.6* 30.1* 27.0* 26.6* 25.6*  MCV 88.2 91.8 90.6 89.9 89.5  PLT 331 462* 366 354 378     Basic Metabolic Panel: Recent Labs  Lab 07/05/21 0323 07/05/21 1416 07/05/21 2052 07/06/21 0001 07/07/21 0547 07/08/21 0331 07/08/21 1606 07/09/21 0313 07/09/21 1414 07/10/21 0434 07/11/21 0500  NA 138  --  137   < > 137   < > 134* 135 135 133* 133*  K 3.6  --  4.4   < > 3.5   < > 4.0 3.6 4.3 3.8 4.2  CL 113*  --  110  --  107   < > 104 102 102 101 99  CO2 21*  --  20*  --  22   < > 24 26 28 26 28   GLUCOSE 126*  --  190*  --  116*   < > 173* 163* 178* 98 113*  BUN 16  --  17  --  17    < > 20 19  19 18 19   CREATININE 0.61  --  0.61  --  0.49*   < > 0.70 0.65 0.73 0.64 0.60*  CALCIUM 7.9*  --  7.9*  --  8.4*   < > 8.4* 8.3* 8.7* 8.7* 8.8*  MG 2.0 2.1 1.8  --  1.9  --   --  1.6*  --  2.0  --   PHOS 1.9* 1.8*  --   --  2.4*  --   --  2.7  --  3.2  --    < > = values in this interval not displayed.     GFR: Estimated Creatinine Clearance: 48.4 mL/min (A) (by C-G formula based on SCr of 0.6 mg/dL (L)).    Cardiac Enzymes: Recent Labs  Lab 07/08/21 0754  CKTOTAL 253      CBG: Recent Labs  Lab 07/10/21 0632 07/10/21 1135 07/10/21 1628 07/10/21 2131 07/11/21 0632  GLUCAP 98 187* 123* 112* 110*      Recent Results (from the past 240 hour(s))  MRSA Next Gen by PCR, Nasal     Status: None   Collection Time: 07/04/21  8:39 AM   Specimen: Nasal Mucosa; Nasal Swab  Result Value Ref Range Status   MRSA by PCR Next Gen NOT DETECTED NOT DETECTED Final    Comment: (NOTE) The GeneXpert MRSA Assay (FDA approved for NASAL specimens only), is one component of a comprehensive MRSA colonization surveillance program. It is not intended to diagnose MRSA infection nor to guide or monitor treatment for MRSA infections. Test performance is not FDA approved in patients less than 74 years old. Performed at [facility], [address] [place], [place] [address]   Surgical PCR screen     Status: None   Collection Time: 07/07/21  8:11 AM   Specimen: Nasal Mucosa; Nasal Swab  Result Value Ref Range Status   MRSA, PCR NEGATIVE NEGATIVE Final   Staphylococcus aureus NEGATIVE NEGATIVE Final    Comment: (NOTE) The Xpert SA Assay (FDA approved for NASAL specimens in patients 74 years of age and older), is one component of a comprehensive surveillance program. It is not intended to diagnose infection nor to guide or monitor treatment. Performed at [facility], [address] [place], [place] [address]        Radiology Studies: No results found.      LOS: 14 days   Keelan Tripodi [employer]rishnan  [facility] Pager on [email or web address]  07/11/2021, 9:45 AM

## 2021-07-11 NOTE — Progress Notes (Signed)
   VASCULAR SURGERY ASSESSMENT & PLAN:   POD 7 LIGATION OF RIGHT FEMOROPOPLITEAL BYPASS GRAFT, RIGHT EXTERNAL ILIAC ARTERY, AND REMOVAL OF BOVINE PERICARDIAL PATCH: The right BKA looks ischemic.  He will likely ultimately require an above-the-knee amputation.  Currently he is not having significant pain however.   SUBJECTIVE:   No specific complaints this morning.  PHYSICAL EXAM:   Vitals:   07/11/21 0034 07/11/21 0337 07/11/21 0452 07/11/21 0824  BP: 120/78 130/83  120/80  Pulse: (!) 108 100  (!) 109  Resp: 14 13  18   Temp: 98.3 F (36.8 C) 98.5 F (36.9 C)  98.1 F (36.7 C)  TempSrc: Oral Oral  Oral  SpO2: 99% 98%  99%  Weight:   53.2 kg   Height:       The right BKA is cool and has ischemic changes.  LABS:   Lab Results  Component Value Date   WBC 12.0 (H) 07/11/2021   HGB 8.3 (L) 07/11/2021   HCT 25.6 (L) 07/11/2021   MCV 89.5 07/11/2021   PLT 378 07/11/2021   Lab Results  Component Value Date   CREATININE 0.60 (L) 07/11/2021   Lab Results  Component Value Date   INR 1.2 06/30/2021   CBG (last 3)  Recent Labs    07/10/21 1628 07/10/21 2131 07/11/21 0632  GLUCAP 123* 112* 110*    PROBLEM LIST:    Principal Problem:   Acute hypercapnic respiratory failure (HCC) Active Problems:   Peripheral artery disease (HCC)   Pseudoaneurysm (HCC)   Encounter for postanesthesia care   Acute blood loss anemia   Pseudomonas infection   Wound infection after surgery   Malnutrition of moderate degree   Acute respiratory failure with hypoxia (HCC)   Pulmonary edema   CURRENT MEDS:    aspirin  81 mg Oral Daily   atorvastatin  40 mg Oral QHS   carvedilol  12.5 mg Oral BID WC   Chlorhexidine Gluconate Cloth  6 each Topical Q0600   clopidogrel  75 mg Oral Daily   docusate sodium  100 mg Oral BID   feeding supplement  237 mL Oral BID BM   [START ON 07/12/2021] furosemide  40 mg Intravenous Daily   gabapentin  100 mg Oral TID   insulin aspart  0-15 Units  Subcutaneous TID AC & HS   mouth rinse  15 mL Mouth Rinse BID   methocarbamol  750 mg Oral QID   multivitamin with minerals  1 tablet Oral Daily   pantoprazole  40 mg Oral Daily   polyethylene glycol  17 g Oral Daily   sacubitril-valsartan  1 tablet Oral BID   sennosides  10 mL Oral QHS   sodium chloride flush  10-40 mL Intracatheter Q12H    09/11/2021 Office: [phone] 07/11/2021

## 2021-07-12 ENCOUNTER — Inpatient Hospital Stay (HOSPITAL_COMMUNITY): Payer: No Typology Code available for payment source

## 2021-07-12 ENCOUNTER — Encounter (HOSPITAL_COMMUNITY): Payer: Self-pay | Admitting: Vascular Surgery

## 2021-07-12 DIAGNOSIS — I5043 Acute on chronic combined systolic (congestive) and diastolic (congestive) heart failure: Secondary | ICD-10-CM

## 2021-07-12 LAB — BASIC METABOLIC PANEL
Anion gap: 9 (ref 5–15)
BUN: 23 mg/dL (ref 8–23)
CO2: 24 mmol/L (ref 22–32)
Calcium: 8.7 mg/dL — ABNORMAL LOW (ref 8.9–10.3)
Chloride: 98 mmol/L (ref 98–111)
Creatinine, Ser: 0.75 mg/dL (ref 0.61–1.24)
GFR, Estimated: >60 mL/min (ref >60)
Glucose, Bld: 143 mg/dL — ABNORMAL HIGH (ref 70–99)
Potassium: 3.8 mmol/L (ref 3.5–5.1)
Sodium: 131 mmol/L — ABNORMAL LOW (ref 135–145)

## 2021-07-12 LAB — ECHOCARDIOGRAM LIMITED
Calc EF: 26.2 %
Height: 53 in
Single Plane A2C EF: 26.7 %
Single Plane A4C EF: 28.5 %
Weight: 1876.56 oz

## 2021-07-12 LAB — GLUCOSE, CAPILLARY
Glucose-Capillary: 95 mg/dL (ref 70–99)
Glucose-Capillary: 128 mg/dL — ABNORMAL HIGH (ref 70–99)
Glucose-Capillary: 165 mg/dL — ABNORMAL HIGH (ref 70–99)
Glucose-Capillary: 181 mg/dL — ABNORMAL HIGH (ref 70–99)
Glucose-Capillary: 110 mg/dL — ABNORMAL HIGH (ref 70–99)

## 2021-07-12 MED ORDER — METOPROLOL TARTRATE 5 MG/5ML IV SOLN
5.0000 mg | Freq: Four times a day (QID) | INTRAVENOUS | Status: DC | PRN
  Administered 2021-07-21: 5 mg via INTRAVENOUS
  Filled 2021-07-12: qty 5

## 2021-07-12 MED ORDER — DAPAGLIFLOZIN PROPANEDIOL 10 MG PO TABS
10.0000 mg | ORAL_TABLET | Freq: Every day | ORAL | Status: DC
  Administered 2021-07-13 – 2021-07-25 (×12): 10 mg via ORAL
  Filled 2021-07-12 (×14): qty 1

## 2021-07-12 NOTE — Progress Notes (Signed)
*  PRELIMINARY RESULTS* Echocardiogram 2D Echocardiogram has been performed.  Neomia Dear RDCS 07/12/2021, 2:21 PM

## 2021-07-12 NOTE — Progress Notes (Addendum)
   VASCULAR SURGERY ASSESSMENT & PLAN:   POD 8 LIGATION OF RIGHT FEMOROPOPLITEAL BYPASS GRAFT, RIGHT EXTERNAL ILIAC ARTERY, AND REMOVAL OF BOVINE PERICARDIAL PATCH: He has some drainage from his right BKA.  I will begin dressing changes.  He will need a right above-the-knee amputation.  Currently he is not having significant pain.  The thigh is somewhat cool to touch so if there is risk of nonhealing his AKA.  ID: He is on IV Cefepime.  His right groin wound had grown Pseudomonas.  SUBJECTIVE:   No specific complaints this morning.  PHYSICAL EXAM:   Vitals:   07/11/21 2051 07/12/21 0010 07/12/21 0048 07/12/21 0100  BP: 122/72  121/82   Pulse: 99  (!) 105 (!) 107  Resp: 20  18 (!) 21  Temp: 98.9 F (37.2 C)  98.5 F (36.9 C)   TempSrc: Oral  Oral   SpO2: 99% 99% 99%   Weight:      Height:       He has drainage from his right below the knee amputation which is clearly ischemic. The groin incision on the right looks fine.  LABS:   Lab Results  Component Value Date   WBC 12.0 (H) 07/11/2021   HGB 8.3 (L) 07/11/2021   HCT 25.6 (L) 07/11/2021   MCV 89.5 07/11/2021   PLT 378 07/11/2021   Lab Results  Component Value Date   CREATININE 0.75 07/12/2021   Lab Results  Component Value Date   INR 1.2 06/30/2021   CBG (last 3)  Recent Labs    07/11/21 0632 07/11/21 2141 07/12/21 0637  GLUCAP 110* 122* 95    PROBLEM LIST:    Principal Problem:   Acute hypercapnic respiratory failure (HCC) Active Problems:   Peripheral artery disease (HCC)   Pseudoaneurysm (HCC)   Encounter for postanesthesia care   Acute blood loss anemia   Pseudomonas infection   Wound infection after surgery   Malnutrition of moderate degree   Acute respiratory failure with hypoxia (HCC)   Pulmonary edema   CURRENT MEDS:    aspirin  81 mg Oral Daily   atorvastatin  40 mg Oral QHS   carvedilol  12.5 mg Oral BID WC   Chlorhexidine Gluconate Cloth  6 each Topical Q0600   clopidogrel  75  mg Oral Daily   docusate sodium  100 mg Oral BID   feeding supplement  237 mL Oral BID BM   furosemide  40 mg Intravenous Daily   gabapentin  100 mg Oral TID   insulin aspart  0-15 Units Subcutaneous TID AC & HS   mouth rinse  15 mL Mouth Rinse BID   methocarbamol  750 mg Oral QID   multivitamin with minerals  1 tablet Oral Daily   pantoprazole  40 mg Oral Daily   polyethylene glycol  17 g Oral Daily   sacubitril-valsartan  1 tablet Oral BID   sennosides  10 mL Oral QHS   sodium chloride flush  10-40 mL Intracatheter Q12H    Waverly Ferrari Office: [phone] 07/12/2021

## 2021-07-12 NOTE — Progress Notes (Addendum)
Pts order is for BIPAP PRN. Pt placed on BIPAP per his request on settings of 15/5 BUR 15 and 40%. RT will continue to monitor.

## 2021-07-12 NOTE — Progress Notes (Signed)
[facility] PROGRESS NOTE   Todd Duran WCB:[id number] DOB: November 12, 1945 DOA: 06/27/2021  PCP: Pcp, No  Brief History/Interval Summary: Patient is a 75 year old male with PMH of DM T2, HTN, bilateral BKA presents to [facility] on 8/20 with pseudoaneurysm of R femoral artery. On 7/13 patient received left BKA.  Patient was brought in on 7/29 for right toe amputation.  Right femoral to popliteal artery bypass grafting was done on 8/3.  On 8/13, patient presents with infected right foot and on 8/14 a right BKA was performed.   On 8/16 patient was sent to CIR.  On 8/20 vascular surgery consulted for bleeding on right groin and hemoglobin dropped to 6.9.  CT showing pseudoaneurysm right femoral artery.  On 8/20 vascular surgery performed surgery to explore pseudoaneurysm and repair of bovine pericardial patch angioplasty.  Wound VAC in place.  On 8/23, vascular surgery performed revision of right femoral retroperitoneal right iliac artery exposure, redo right femoral artery exposure, and endarterectomy and placement of multiple budgeted sutures for hemostasis.  Right femoral Prevena wound VAC postop cultures show Pseudomonas aeruginosa.  Started on cefepime.  On 8/26, acute bleed from right groin with VAC output of greater than 500 mL.  Performed right groin exploration and evacuation of right groin hematoma.  On 8/27, patient brought back to the OR for reexploration of right groin, ligation of right femoropopliteal vein bypass, ligation of right external iliac artery, excision of right bovine pericardial patch with excision of right common femoral artery, and negative pressure drain to right groin.  Patient was in the intensive care unit on mechanical ventilation.  Seen by the critical care medicine team.  Extubated.  However developed fluid overload for which she required BiPAP for a few days.  Seems to be stable.  Significant events: 8/27: right groin re-exploration and evacuation of hematoma, very  friable vessels, ultimately opted for ligation.  8/28 more comfortable. No signs of bleeding.  But ended up on BIPAP; 2/2 edema seemed to respond to edema  8/29 adding back home meds. Palliative consulted by primary team.  Echocardiogram: Ejection fraction 30 to 35%, global hypokinesis, LV mildly dilated.  Diastolic parameters consistent with grade 2 diastolic dysfunction.  RV size normal, RV function normal.  Lactate elevated at 4 8/30: Quiet evening.  Blood pressure under control.  Lactate cleared to 0.7 bedside and assessment of lower limb, seemed a little bit warmer on exam.  No distress. Overnight patient got SOB. Pulm edema given lasix and placed on bipap 8/31: patient got sob with pulm edema. Placed on bipap for a few hours and lasix given.  Antibiotics: Anti-infectives (From admission, onward)    Start     Dose/Rate Route Frequency Ordered Stop   07/02/21 1300  ceFEPIme (MAXIPIME) 2 g in sodium chloride 0.9 % 100 mL IVPB        2 g 200 mL/hr over 30 Minutes Intravenous Every 12 hours 07/02/21 1201 08/16/21 1259   06/28/21 0200  ceFAZolin (ANCEF) IVPB 2g/100 mL premix        2 g 200 mL/hr over 30 Minutes Intravenous Every 8 hours 06/27/21 2320 06/28/21 1002       Subjective/Interval History: Patient denies any worsening shortness of breath.  Denies any nausea vomiting.  Some pain in his right lower extremity is present.      Assessment/Plan:  Acute respiratory failure with hypoxia Most likely due to pulmonary edema from systolic CHF.  Required BiPAP for few days.  Was aggressively diuresed with  improvement.  He is saturating normal on room air.  Use BiPAP only if needed.  Continue with incentive spirometry.   Patient continues to be stable from a respiratory standpoint.  Okay to mobilize.  Continue to monitor.     Acute systolic CHF Appears to be a new diagnosis.  Echocardiogram showed EF to be 30 to 35% with global hypokinesis.  Troponin elevation was noted which was thought  to be due to demand ischemia.   Patient was placed on IV diuretics.  Patient had diuresed well.  Weight has decreased.  He is off of oxygen.  Lasix was changed to once a day. Patient is also on aspirin, Plavix, statin, Entresto and carvedilol.   Telemetry continues to show sinus tachycardia.  EKG was done yesterday which showed sinus tachycardia.  Dose of carvedilol was increased yesterday.  Check TSH. Patient's renal function is noted to be normal.  He likely has coronary artery disease.  His EKG is abnormal.  It may be worthwhile getting cardiology input while he is here in the hospital.  Will discuss with cardiology today.  Essential hypertension/hyponatremia Patient is reasonably well controlled.  Drop in sodium level is likely due to furosemide.  Continue to monitor.  Peripheral artery disease/status post recent right below-knee amputation Patient being followed by vascular surgery.  Had bleeding complications requiring ligation of the right femoral-popliteal vein bypass ligation of right external iliac artery and excision of right bovine pericardial patch with excision of right common femoral artery.  Has been stable.   Hemoglobin is stable.  On aspirin and Plavix.   Further management per vascular surgery.  They feel the patient may need more surgery depending on how the right lower extremity stump heals.  Pseudomonas infection right femoral area Cefepime was started on 8/25.  Plan is to give it for 4 to 6 weeks per CCM notes. WBC has been improving.  Remains afebrile.  Patient has a PICC line in his right arm which was placed on 8/28. Will eventually need to discuss with ID regarding duration of treatment.  Can be done closer to discharge.  Acute blood loss anemia Hemoglobin has been stable for the last several days.    Diabetes mellitus type 2, controlled Monitor CBGs.  SSI.  Goals of care Seen by palliative care.  Patient is DNR.  Sacral decubitus stage II Pressure Injury  06/05/21 Coccyx Mid;Other (Comment) Stage 2 -  Partial thickness loss of dermis presenting as a shallow open injury with a red, pink wound bed without slough. (Active)  06/05/21 2200  Location: Coccyx  Location Orientation: Mid;Other (Comment) (coccyx)  Staging: Stage 2 -  Partial thickness loss of dermis presenting as a shallow open injury with a red, pink wound bed without slough.  Wound Description (Comments):   Present on Admission: Yes   Obesity Estimated body mass index is 29.36 kg/m as calculated from the following:   Height as of this encounter: 4\' 5"  (1.346 m).   Weight as of this encounter: 53.2 kg.  Moderate protein calorie malnutrition Nutrition Problem: Moderate Malnutrition Etiology: chronic illness (PVD/PAD)  Signs/Symptoms: moderate fat depletion, severe muscle depletion  Interventions: Ensure Enlive (each supplement provides 350kcal and 20 grams of protein)   DVT Prophylaxis: Being deferred to primary service.   Medications: Scheduled:  aspirin  81 mg Oral Daily   atorvastatin  40 mg Oral QHS   carvedilol  12.5 mg Oral BID WC   Chlorhexidine Gluconate Cloth  6 each Topical Q0600  clopidogrel  75 mg Oral Daily   docusate sodium  100 mg Oral BID   feeding supplement  237 mL Oral BID BM   furosemide  40 mg Intravenous Daily   gabapentin  100 mg Oral TID   insulin aspart  0-15 Units Subcutaneous TID AC & HS   mouth rinse  15 mL Mouth Rinse BID   methocarbamol  750 mg Oral QID   multivitamin with minerals  1 tablet Oral Daily   pantoprazole  40 mg Oral Daily   polyethylene glycol  17 g Oral Daily   sacubitril-valsartan  1 tablet Oral BID   sennosides  10 mL Oral QHS   sodium chloride flush  10-40 mL Intracatheter Q12H   Continuous:  sodium chloride 10 mL/hr at 07/10/21 1900   ceFEPime (MAXIPIME) IV 2 g (07/12/21 0502)   magnesium sulfate bolus IVPB     PPJ:KDTOIZ chloride, acetaminophen **OR** acetaminophen, alum & mag hydroxide-simeth, hydrALAZINE,  magnesium sulfate bolus IVPB, metoprolol tartrate, morphine injection, ondansetron, phenol, sodium chloride flush, traMADol   Objective:  Vital Signs  Vitals:   07/12/21 0010 07/12/21 0048 07/12/21 0100 07/12/21 0737  BP:  121/82  125/85  Pulse:  (!) 105 (!) 107   Resp:  18 (!) 21 16  Temp:  98.5 F (36.9 C)  98.4 F (36.9 C)  TempSrc:  Oral  Oral  SpO2: 99% 99%  100%  Weight:      Height:        Intake/Output Summary (Last 24 hours) at 07/12/2021 0951 Last data filed at 07/11/2021 1245 Gross per 24 hour  Intake --  Output 300 ml  Net -300 ml    Filed Weights   07/06/21 0300 07/07/21 0630 07/11/21 0452  Weight: 58.2 kg 57.7 kg 53.2 kg    General appearance: Awake alert.  In no distress Resp: Clear to auscultation bilaterally.  Normal effort Cardio: S1-S2 is mildly tachycardic but regular.  No S3-S4.  No rubs murmurs or bruit GI: Abdomen is soft.  Nontender nondistended.  Bowel sounds are present normal.  No masses organomegaly Extremities: Right BKA with cold stump.  Left AKA previously Neurologic:   No focal neurological deficits.       Lab Results:  Data Reviewed: I have personally reviewed following labs and imaging studies  CBC: Recent Labs  Lab 07/07/21 0547 07/07/21 1800 07/08/21 0331 07/09/21 0313 07/11/21 0500  WBC 15.5* 25.2* 16.4* 14.0* 12.0*  HGB 8.9* 9.5* 8.5* 8.4* 8.3*  HCT 27.6* 30.1* 27.0* 26.6* 25.6*  MCV 88.2 91.8 90.6 89.9 89.5  PLT 331 462* 366 354 378     Basic Metabolic Panel: Recent Labs  Lab  0000 07/05/21 1416 07/05/21 2052 07/06/21 0001 07/07/21 0547 07/08/21 0331 07/09/21 0313 07/09/21 1414 07/10/21 0434 07/11/21 0500 07/12/21 0127  NA  --   --  137   < > 137   < > 135 135 133* 133* 131*  K  --   --  4.4   < > 3.5   < > 3.6 4.3 3.8 4.2 3.8  CL   < >  --  110  --  107   < > 102 102 101 99 98  CO2   < >  --  20*  --  22   < > 26 28 26 28 24   GLUCOSE   < >  --  190*  --  116*   < > 163* 178* 98 113* 143*  BUN   < >   --  17  --  17   < > 19 19 18 19 23   CREATININE   < >  --  0.61  --  0.49*   < > 0.65 0.73 0.64 0.60* 0.75  CALCIUM   < >  --  7.9*  --  8.4*   < > 8.3* 8.7* 8.7* 8.8* 8.7*  MG  --  2.1 1.8  --  1.9  --  1.6*  --  2.0  --   --   PHOS  --  1.8*  --   --  2.4*  --  2.7  --  3.2  --   --    < > = values in this interval not displayed.     GFR: Estimated Creatinine Clearance: 48.4 mL/min (by C-G formula based on SCr of 0.75 mg/dL).    Cardiac Enzymes: Recent Labs  Lab 07/08/21 0754  CKTOTAL 253      CBG: Recent Labs  Lab 07/10/21 1628 07/10/21 2131 07/11/21 0632 07/11/21 2141 07/12/21 0637  GLUCAP 123* 112* 110* 122* 95      Recent Results (from the past 240 hour(s))  MRSA Next Gen by PCR, Nasal     Status: None   Collection Time: 07/04/21  8:39 AM   Specimen: Nasal Mucosa; Nasal Swab  Result Value Ref Range Status   MRSA by PCR Next Gen NOT DETECTED NOT DETECTED Final    Comment: (NOTE) The GeneXpert MRSA Assay (FDA approved for NASAL specimens only), is one component of a comprehensive MRSA colonization surveillance program. It is not intended to diagnose MRSA infection nor to guide or monitor treatment for MRSA infections. Test performance is not FDA approved in patients less than 463 years old. Performed at [facility], [address] [place], [place] [address]   Surgical PCR screen     Status: None   Collection Time: 07/07/21  8:11 AM   Specimen: Nasal Mucosa; Nasal Swab  Result Value Ref Range Status   MRSA, PCR NEGATIVE NEGATIVE Final   Staphylococcus aureus NEGATIVE NEGATIVE Final    Comment: (NOTE) The Xpert SA Assay (FDA approved for NASAL specimens in patients 74 years of age and older), is one component of a comprehensive surveillance program. It is not intended to diagnose infection nor to guide or monitor treatment. Performed at [facility], [address] [place], [place] [address]        Radiology Studies: No results  found.     LOS: 15 days   Maleeha Halls [employer]rishnan  [facility] Pager on [email or web address]  07/12/2021, 9:51 AM

## 2021-07-12 NOTE — Consult Note (Addendum)
Cardiology Consultation:   Patient ID: RUBENS CRANSTON MRN: [id number]; DOB: 08/15/1946  Admit date: 06/27/2021 Date of Consult: 07/12/2021  PCP:  Oneita Hurt, No   [facility] Providers Cardiologist:  New to [facility]   Patient Profile:   HANNAH CRILL is a 75 y.o. male with a PMH of HTN, HLD, DM type 2, PAD s/p recent L AKA and most recent R BKA, and tobacco abuse, who is being seen 07/12/2021 for the evaluation of CHF at the request of Dr. Rito Ehrlich.  History of Present Illness:   Mr. Covalt establish care with vascular surgery 05/08/2021.  He underwent ABIs which revealed absent pedal pulses bilaterally.  He then had an abdominal aortogram with lower extremity 05/12/2021 which showed occlusion of left common femoral artery, left superficial femoral artery occlusion at the abductor canal with popliteal reconstitution and three-vessel runoff, 90% right iliac stenosis, severe right common femoral stenosis along with severe right superficial femoral artery stenosis with recommendations to undergo left BKA with consideration for revascularization of his right leg in the future.  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  He returns 06/05/2021 for right toe amputation.  He then had right femoral to popliteal artery bypass grafting on 06/10/2021.  Unfortunately he returned shortly thereafter with an infected right foot and ultimately underwent right BKA 06/21/2021.  He was discharged to CIR.  Vascular surgery was asked to reevaluate the patient 06/27/2021 for bleeding on right groin and drop in hemoglobin to 6.9.  CT showed pseudoaneurysm of right femoral artery and he was taken to surgery for repair of bovine pericardial patch angioplasty for management of his pseudoaneurysm and a wound VAC was placed.  He had further surgery 06/30/2021 for revision of right femoral retroperitoneal right iliac artery exposure, redo right femoral artery exposure, and endarterectomy and placement of multiple pledgeted sutures for  hemostasis.  Postop wound VAC cultures grew Pseudomonas and he was started on IV antibiotics.  He had further bleeding from right groin significant blood loss requiring him to return to the OR again 07/04/2021 for exploration and excision of right bovine pericardial patch.  This surgery was complicated by inability to wean off ventilator prompting PCCM evaluation for vent management.  An echocardiogram was obtained 07/06/2021 showing EF 30 to 35%, global hypokinesis, mild LV dilation, G2DD, normal RV size and function, moderate LAE, no significant valvular dysfunction, and mild dilation of the aortic root (37 mm).  He has been diuresing with IV Lasix 40 mg twice daily, transition to once daily 07/11/2021.  Weight peaked at 132 pounds 06/29/2021, and he is down to 117 pounds with diuresis.  I&O's appear incomplete.  Cardiology asked to evaluate for CHF.    He has no prior cardiac history.  Prior to this admission he has not had any previous cardiac imaging or ischemic evaluation documented in epic or Care Everywhere.  The acuity of his CHF findings this admission are unclear.  He has been noted to have three-vessel coronary atherosclerosis and aortic atherosclerosis on prior CT imaging.  Over the past 24 hours he has been mildly tachycardic to the 100s otherwise vital signs stable.  Labs notable for creatinine 0.75, hemoglobin 8.3 (suspect this could be his baseline-hemoglobin as low as 6.7 this admission and as high as 11.3 following transfusion of which he has received 2U PRBC (07/03/21) and 1U FFP(06/27/21)). EKG shows sinus tachycardia, rate 104 bpm, inferolateral T wave abnormalities with deep T wave inversions and STD in V5-6, LVH with repull abnormalities, no STE. he  was seen by palliative care this admission and confirmed to be DNR/DNI, though wished for the opportunity to undergo additional surgeries if that would help him to eventually return home.  At the time of this exam he is having some leg pain and is  asking for medications. History is limited by poor memory and slow processing speed. He is oriented to place, year, president and self, but states it is November. He does not recall any meaningful chest pain episodes prior to admission. He thinks he had a stress test in August 2022 but does not recall any details about the results. He denies prior cardiac catheterizations. He denies any chest pain this admission. He has had some intermittent SOB but is comfortable today. He has been sleeping propped up and denies PND. He denies any palpitations, dizziness, or lightheadedness. No syncopal episodes. He reports he has smoke 1ppp for 60 years.    Past Medical History:  Diagnosis Date   Diabetes mellitus without complication (HCC)    Hypertension     Past Surgical History:  Procedure Laterality Date   ABDOMINAL AORTOGRAM W/LOWER EXTREMITY N/A 05/12/2021   Procedure: ABDOMINAL AORTOGRAM W/LOWER EXTREMITY;  Surgeon: Nada Libman, MD;  [facility]: [facility];  Service: Cardiovascular;  Laterality: N/A;   AMPUTATION Left 05/13/2021   Procedure: AMPUTATION BELOW KNEE LEFT ;  Surgeon: Nada Libman, MD;  Location[facility] OR;  Service: Vascular;  Laterality: Left;   AMPUTATION Left 05/20/2021   Procedure: AMPUTATION ABOVE KNEE;  Surgeon: Cephus Shelling, MD;  Location: [facility] OR;  Service: Vascular;  Laterality: Left;   AMPUTATION Right 06/05/2021   Procedure: RIGHT FIFTH TOE AMPUTATION;  Surgeon: Nada Libman, MD;  Location: [facility] OR;  Service: Vascular;  Laterality: Right;   AMPUTATION Right 06/10/2021   Procedure: RIGHT THIRD AND  FOURTH TOE AMPUTATION;  Surgeon: Nada Libman, MD;  Location: [facility] OR;  Service: Vascular;  Laterality: Right;   AMPUTATION Right 06/21/2021   Procedure: RIGHT BELOW KNEE AMPUTATION;  Surgeon: Nada Libman, MD;  Location: [facility] OR;  Service: Vascular;  Laterality: Right;   APPLICATION OF WOUND VAC Right 06/30/2021   Procedure: APPLICATION OF PREVENA WOUND VAC;  Surgeon:  Nada Libman, MD;  Location: [facility] OR;  Service: Vascular;  Laterality: Right;   ENDARTERECTOMY FEMORAL Right 06/05/2021   Procedure: RIGHT FEMORAL ENDARTERECTOMY;  Surgeon: Nada Libman, MD;  Location: [facility] OR;  Service: Vascular;  Laterality: Right;   FEMORAL ARTERY EXPLORATION Right 07/03/2021   Procedure: FEMORAL ARTERY EXPLORATION WITH COMMON ARTERY REPAIR, evacuation of hematoma. ;  Surgeon: Cephus Shelling, MD;  Location: [facility] OR;  Service: Vascular;  Laterality: Right;   FEMORAL ARTERY EXPLORATION Right 07/04/2021   Procedure: FEMORAL ARTERY RE- EXPLORATION AND Ligation of Right Fermoral/Popliteal Bypass and Right External Iliac Artery and Excision right Bovine patch and common Femoral Artery.;  Surgeon: Cephus Shelling, MD;  Location: [facility] OR;  Service: Vascular;  Laterality: Right;   FEMORAL-POPLITEAL BYPASS GRAFT Right 06/10/2021   Procedure: RIGHT FEMORAL TO ABOVE KNEE POPLITEAL ARTERY BYPASS GRAFTING USING RIGHT NON REVERSED GREATER SAPHENOUS VEIN;  Surgeon: Nada Libman, MD;  Location: [facility] OR;  Service: Vascular;  Laterality: Right;   HERNIA REPAIR     INSERTION OF ILIAC STENT Right 06/05/2021   Procedure: INSERTION OF RIGHT ILIAC AND SUPERFICIAL FEMORAL ARTERY STENT;  Surgeon: Nada Libman, MD;  Location: [facility] OR;  Service: Vascular;  Laterality: Right;   KNEE SURGERY  PATCH ANGIOPLASTY Right 06/05/2021   Procedure: PATCH ANGIOPLASTY OF RIGHT FEMORAL ARTERY USING XENOSURE BIOLOGIC PATCH;  Surgeon: Nada Libman, MD;  Location: [facility] OR;  Service: Vascular;  Laterality: Right;   REPAIR ILIAC ARTERY Right 06/27/2021   Procedure: RETROPERITONEAL EXPOSURE AND REPAIR EXTERNAL ILIAC ARTERY;  Surgeon: Chuck Hint, MD;  Location: [facility] OR;  Service: Vascular;  Laterality: Right;   VEIN HARVEST Right 06/10/2021   Procedure: VEIN HARVEST OF RIGHT GREATER SAPHENOUS VEIN;  Surgeon: Nada Libman, MD;  Location: [facility] OR;  Service: Vascular;  Laterality: Right;     Home  Medications:  Prior to Admission medications   Medication Sig Start Date End Date Taking? Authorizing Provider  amLODipine (NORVASC) 10 MG tablet Take 10 mg by mouth daily.    [provider]  aspirin 81 MG EC tablet Take 1 tablet (81 mg total) by mouth daily. Swallow whole. 05/23/21   Rhyne, Ames Coupe, PA-C  atenolol (TENORMIN) 25 MG tablet Take 1 tablet (25 mg total) by mouth 2 (two) times daily. Patient taking differently: Take 100 mg by mouth daily. 05/23/21   Rhyne, Ames Coupe, PA-C  atorvastatin (LIPITOR) 20 MG tablet Take 20 mg by mouth at bedtime.    [provider]  clopidogrel (PLAVIX) 75 MG tablet Take 1 tablet (75 mg total) by mouth daily. 06/24/21   Emilie Rutter, PA-C  cyanocobalamin 1000 MCG tablet Take 1,000 mcg by mouth daily.    [provider]  diclofenac Sodium (VOLTAREN) 1 % GEL Apply 2 g topically 3 (three) times daily. 06/05/21   Angiulli, Mcarthur Rossetti, PA-C  ferrous sulfate 325 (65 FE) MG tablet Take 1 tablet (325 mg total) by mouth daily with breakfast. 06/05/21   Angiulli, Mcarthur Rossetti, PA-C  folic acid (FOLVITE) 1 MG tablet Take 1 tablet (1 mg total) by mouth daily. 05/23/21   Rhyne, Ames Coupe, PA-C  gabapentin (NEURONTIN) 100 MG capsule Take 1 capsule (100 mg total) by mouth 2 (two) times daily. Patient taking differently: Take 100 mg by mouth 3 (three) times daily. 06/05/21   Angiulli, Mcarthur Rossetti, PA-C  hydrochlorothiazide (HYDRODIURIL) 25 MG tablet Take 25 mg by mouth daily.    [provider]  metFORMIN (GLUCOPHAGE) 500 MG tablet Take 500 mg by mouth daily with breakfast.    [provider]  methocarbamol (ROBAXIN) 750 MG tablet Take 750 mg by mouth 4 (four) times daily.    [provider]  Multiple Vitamin (MULTIVITAMIN WITH MINERALS) TABS tablet Take 1 tablet by mouth daily. 05/23/21   Rhyne, Ames Coupe, PA-C  oxybutynin (DITROPAN) 5 MG tablet Take 0.5 tablets (2.5 mg total) by mouth 2 (two) times daily. 05/19/21   Angiulli,  Mcarthur Rossetti, PA-C  oxyCODONE-acetaminophen (PERCOCET/ROXICET) 5-325 MG tablet Take 1-2 tablets by mouth every 4 (four) hours as needed for moderate pain. 06/05/21   Angiulli, Mcarthur Rossetti, PA-C  polyethylene glycol (MIRALAX / GLYCOLAX) 17 g packet Take 17 g by mouth daily as needed for mild constipation. 06/05/21   Angiulli, Mcarthur Rossetti, PA-C    Inpatient Medications: Scheduled Meds:  aspirin  81 mg Oral Daily   atorvastatin  40 mg Oral QHS   carvedilol  12.5 mg Oral BID WC   Chlorhexidine Gluconate Cloth  6 each Topical Q0600   clopidogrel  75 mg Oral Daily   dapagliflozin propanediol  10 mg Oral Daily   docusate sodium  100 mg Oral BID   feeding supplement  237 mL Oral BID BM  furosemide  40 mg Intravenous Daily   gabapentin  100 mg Oral TID   insulin aspart  0-15 Units Subcutaneous TID AC & HS   mouth rinse  15 mL Mouth Rinse BID   methocarbamol  750 mg Oral QID   multivitamin with minerals  1 tablet Oral Daily   pantoprazole  40 mg Oral Daily   polyethylene glycol  17 g Oral Daily   sacubitril-valsartan  1 tablet Oral BID   sennosides  10 mL Oral QHS   sodium chloride flush  10-40 mL Intracatheter Q12H   Continuous Infusions:  sodium chloride 10 mL/hr at 07/10/21 1900   ceFEPime (MAXIPIME) IV 2 g (07/12/21 1329)   magnesium sulfate bolus IVPB     PRN Meds: sodium chloride, acetaminophen **OR** acetaminophen, alum & mag hydroxide-simeth, hydrALAZINE, magnesium sulfate bolus IVPB, metoprolol tartrate, morphine injection, ondansetron, phenol, sodium chloride flush, traMADol  Allergies:    Allergies  Allergen Reactions   Lisinopril Swelling    Swelling of lips    Social History:   Social History   Socioeconomic History   Marital status: Married    Spouse name: Not on file   Number of children: Not on file   Years of education: Not on file   Highest education level: Not on file  Occupational History   Not on file  Tobacco Use   Smoking status: Every Day    Packs/day: 0.50     Types: Cigarettes   Smokeless tobacco: Never  Vaping Use   Vaping Use: Never used  Substance and Sexual Activity   Alcohol use: Yes   Drug use: Never   Sexual activity: Not Currently  Other Topics Concern   Not on file  Social History Narrative   Not on file   Social Determinants of Health   Financial Resource Strain: Not on file  Food Insecurity: Not on file  Transportation Needs: Not on file  Physical Activity: Not on file  Stress: Not on file  Social Connections: Not on file  Intimate Partner Violence: Not on file    Family History:    Family History  Problem Relation Age of Onset   Hypertension Father      ROS:  Please see the history of present illness.   All other ROS reviewed and negative.     Physical Exam/Data:   Vitals:   07/12/21 0100 07/12/21 0737 07/12/21 1132 07/12/21 1205  BP:  125/85 125/80 121/79  Pulse: (!) 107     Resp: (!) 21 16 12 16   Temp:  98.4 F (36.9 C) 98.4 F (36.9 C) 98.6 F (37 C)  TempSrc:  Oral Oral Oral  SpO2:  100% 97% 100%  Weight:      Height:        Intake/Output Summary (Last 24 hours) at 07/12/2021 1405 Last data filed at 07/12/2021 1151 Gross per 24 hour  Intake 20 ml  Output --  Net 20 ml   Last 3 Weights 07/11/2021 07/07/2021 07/06/2021  Weight (lbs) 117 lb 4.6 oz 127 lb 3.3 oz 128 lb 4.9 oz  Weight (kg) 53.2 kg 57.7 kg 58.2 kg     Body mass index is 29.36 kg/m.  General:  Thin elderly gentleman sitting upright in bed in NAD HEENT: sclera anicteric Neck: no JVD Vascular: No carotid bruits; s/p L AKA and R BKA Cardiac:  normal S1, S2; tachycardic, regular rate; no murmurs, rubs, or gallops Lungs:  faint crackles at lung bases otherwise CTAB Abd: soft,  nontender, no hepatomegaly  Ext: + RLE edema; staples to BKA site with mild oozing Musculoskeletal:  No deformities, BUE and BLE strength normal and equal Skin: warm and dry  Neuro:  CNs 2-12 intact, no focal abnormalities noted Psych:  Normal affect    EKG:  The EKG was personally reviewed and demonstrates:  sinus tachycardia, rate 104 bpm, inferolateral T wave abnormalities with deep T wave inversions and STD in V5-6, LVH with repull abnormalities, no STE  Telemetry:  Telemetry was personally reviewed and demonstrates:  sinus tachycardia with episodes of MAT and SVT with HR up to 140s at times.   Relevant CV Studies: Echocardiogram 07/06/21: 1. Left ventricular ejection fraction, by estimation, is 30 to 35%. The  left ventricle has moderately decreased function. The left ventricle  demonstrates global hypokinesis. The left ventricular internal cavity size  was mildly dilated. Left ventricular  diastolic parameters are consistent with Grade II diastolic dysfunction  (pseudonormalization).   2. Right ventricular systolic function is normal. The right ventricular  size is normal. Tricuspid regurgitation signal is inadequate for assessing  PA pressure.   3. Left atrial size was moderately dilated.   4. The mitral valve is normal in structure. No evidence of mitral valve  regurgitation. No evidence of mitral stenosis.   5. The aortic valve is tricuspid. Aortic valve regurgitation is not  visualized. Mild aortic valve sclerosis is present, with no evidence of  aortic valve stenosis.   6. Aortic dilatation noted. There is mild dilatation of the aortic root,  measuring 37 mm.   7. The IVC was not visualized.   8. Technically difficult study with poor acoustic windows.   Laboratory Data:  High Sensitivity Troponin:   Recent Labs  Lab 07/05/21 2052 07/05/21 2253 07/06/21 0202 07/06/21 0631  TROPONINIHS 126* 192* 265* 265*     Chemistry Recent Labs  Lab 07/10/21 0434 07/11/21 0500 07/12/21 0127  NA 133* 133* 131*  K 3.8 4.2 3.8  CL 101 99 98  CO2 26 28 24   GLUCOSE 98 113* 143*  BUN 18 19 23   CREATININE 0.64 0.60* 0.75  CALCIUM 8.7* 8.8* 8.7*  GFRNONAA >60 >60 >60  ANIONGAP 6 6 9     No results for input(s): PROT,  ALBUMIN, AST, ALT, ALKPHOS, BILITOT in the last 168 hours. Hematology Recent Labs  Lab 07/08/21 0331 07/09/21 0313 07/11/21 0500  WBC 16.4* 14.0* 12.0*  RBC 2.98* 2.96* 2.86*  HGB 8.5* 8.4* 8.3*  HCT 27.0* 26.6* 25.6*  MCV 90.6 89.9 89.5  MCH 28.5 28.4 29.0  MCHC 31.5 31.6 32.4  RDW 15.9* 15.6* 15.1  PLT 366 354 378   BNP Recent Labs  Lab 07/09/21 0313  BNP 1,997.1*    DDimer No results for input(s): DDIMER in the last 168 hours.   Radiology/Studies:  DG Chest Port 1 View  Result Date: 07/09/2021 CLINICAL DATA:  Shortness of breath.  Respiratory failure. EXAM: PORTABLE CHEST 1 VIEW COMPARISON:  07/07/2021 FINDINGS: Right arm PICC line tip projects over the cavoatrial junction. Stable cardiomediastinal contours. Bilateral pleural effusions with veil like opacification of the mid and lower lung zones again noted. Mild interstitial edema is stable to improved in the interval. IMPRESSION: 1. Stable to improved mild interstitial edema. 2. Persistent bilateral pleural effusions. Electronically Signed   By: Signa Kellaylor  Stroud M.D.   On: 07/09/2021 09:12     Assessment and Plan:   1. Acute combined CHF: Patient is here following left AKA and subsequent right BKA with  multiple complications including pseudoaneurysm, bleeding, and postop wound VAC growing Pseudomonas.  Following surgery 07/05/2021 he had difficulty weaning from the vent felt to be secondary to pulmonary edema for which he was diuresed with IV Lasix.  An echocardiogram was obtained 07/06/2021 showing EF 30 to 35%, global hypokinesis, G2DD, moderate LAE, mild RAE, and no significant valvular abnormalities.  The acuity of his CHF diagnosis is unclear.  He has not had any prior cardiac imaging or ischemic evaluation on review of epic and care everywhere.  He does however have evidence of three-vessel coronary artery calcifications and aortic atherosclerosis on CT.  Suspicion is high for severe multivessel disease given history of severe  PAD, hypertension, diabetes, and tobacco abuse.  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 He was started on carvedilol and Entresto for GDMT. - Given ongoing infection, comorbidities, and patients wishes to avoid additional invasive procedures, will hold off on an ischemic evaluation at this time.  - Continue carvedilol and Entresto - Would benefit from addition of SGLT2 inhibitor-we will add [place] - Consider addition of spironolactone if room in BP and creatinine prior to discharge. - Continue aspirin and plavix - Continue IV Lasix 40 mg daily - Continue to monitor strict I&O's and daily weights  2. MAT/SVT: having episodes of SVT with rates up to 140s at times. Patient is asymptomatic. HR appears to be in 100s over the past several days. Carvedilol uptitrated last night - Continue carvedilol with plans to uptitrate further tomorrow if rates not improved - Order updated to utilize IV metoprolol prn for sustained SVT  3. PAD: Undergone multiple vascular surgery procedures for management of his severe bilateral PAD.  He is now s/p left AKA and right BKA the latter of which has been complicated by pseudoaneurysm and bleeding requiring multiple return trips to the OR for repair.  Appears there is some concern for poorly healing BKA wound and vascular team is considering AKA.  He remains on IV cefepime for Pseudomonas growing in right groin wound.  He is on aspirin and Plavix. - Continue management per vascular surgery  4. HTN: BP has been stable - Managed in the context of #1  5. HLD: LDL 70 06/06/21 - Continue atorvastatin  6. DM type II: A1c 5.2 05/13/21 - Continue management per primary team - Will add Farxiga as above  7. Tobacco abuse: smoked 1ppd x60 years. Likely has underlying COPD which is contributing to #2. He has not smoked since undergoing amputation surgery     Risk Assessment/Risk Scores:    New York Heart Association (NYHA) Functional Class NYHA Class  III       For questions or updates, please contact [facility] Please consult [email or web address] for contact info under    Signed, Beatriz Stallion, PA-C  07/12/2021 2:05 PM  Patient seen and examined with Judy Pimple, PA-C.  Agree as above, with the following exceptions and changes as noted below.  Mr. [name] is a 75 year old gentleman seen today for acute systolic heart failure.  Patient's brother and patient's brother's fianc are at the bedside and provide additional history.  He is currently admitted to the vascular surgery service and is postop day 8 for ligation of right femoral-popliteal bypass graft, right external iliac artery and removal of bovine pericardial patch, with a history of bilateral BKA and initial presentation of pseudoaneurysm of the right femoral artery.  History of diabetes mellitus type 2, hypertension.  Initial left BKA 05/20/2021, and subsequently brought in on 729  for right toe amputation with right femoral to popliteal bypass grafting on 06/10/2021, on 06/20/2021 patient presented with an infected right foot therefore an 814 right BKA performed.  Echocardiogram performed on 07/06/2021 documenting reduced EF.  Review of noncontrast chest CT shows dense proximal coronary artery calcifications in the left main, LAD, left circumflex, and ostial right coronary artery.  Patient mentioned to prior team's having a stress test at the [place] at some point, however his brother cannot remember taking him for this test.  The patient does not seem interested in having any further procedures done per his vocalization today.  We discussed in detail that an ischemic evaluation would be the next step in work-up of cardiomyopathy, however he is a poor candidate for interventions given overall medical illness, and is likely a prohibitive surgical revascularization candidate from the standpoint of cardiovascular surgery.  Gen: NAD, CV: RRR, soft systolic murmur, lungs: clear, Abd: soft, Extrem:  Bilateral amputation, right groin dressed.  Neuro/Psych: Patient is alert, participates in the conversation, but does not always respond appropriately to questions.  All available labs, radiology testing, previous records reviewed.   Patient is already been started on medical management of reduced EF.  With ongoing concerns of infection and wound healing as well as no access to the groin, options for ischemic evaluation and likely need for revascularization are limited.  Fortunately he is without chest pain or shortness of breath.  We discussed that diagnostic coronary angiography can be performed, however if revascularization was necessary, would have to coordinate with vascular surgery for use of antiplatelets.  He is overall quite frail.  We will clarify with vascular surgery timing of any future interventions which by documentation may involve AKA and make plans for ischemic evaluation in that regard.  There is no urgency to ischemic evaluation, however the patient may require further surgeries.  Patient should be treated as high cardiovascular risk with presumed proximal severe coronary artery disease based on density of calcium in proximal location on noncontrast and on gated chest CT.  Patient is on beta-blocker which was recently uptitrated, if recurrent SVT can use IV doses of metoprolol so he does not have demand ischemia.  He does not appear to be in decompensated heart failure.  Can uptitrate carvedilol further tomorrow.  Parke Poisson, MD 07/12/21 6:05 PM

## 2021-07-13 LAB — GLUCOSE, CAPILLARY
Glucose-Capillary: 104 mg/dL — ABNORMAL HIGH (ref 70–99)
Glucose-Capillary: 170 mg/dL — ABNORMAL HIGH (ref 70–99)
Glucose-Capillary: 159 mg/dL — ABNORMAL HIGH (ref 70–99)
Glucose-Capillary: 137 mg/dL — ABNORMAL HIGH (ref 70–99)

## 2021-07-13 LAB — BASIC METABOLIC PANEL
Anion gap: 10 (ref 5–15)
BUN: 21 mg/dL (ref 8–23)
CO2: 25 mmol/L (ref 22–32)
Calcium: 8.8 mg/dL — ABNORMAL LOW (ref 8.9–10.3)
Chloride: 100 mmol/L (ref 98–111)
Creatinine, Ser: 0.75 mg/dL (ref 0.61–1.24)
GFR, Estimated: >60 mL/min (ref >60)
Glucose, Bld: 103 mg/dL — ABNORMAL HIGH (ref 70–99)
Potassium: 3.7 mmol/L (ref 3.5–5.1)
Sodium: 135 mmol/L (ref 135–145)

## 2021-07-13 LAB — T4, FREE: Free T4: 1.27 ng/dL — ABNORMAL HIGH (ref 0.61–1.12)

## 2021-07-13 LAB — TSH: TSH: 2.022 u[IU]/mL (ref 0.350–4.500)

## 2021-07-13 NOTE — Progress Notes (Signed)
Physical Therapy Treatment Patient Details Name: Todd Duran MRN: [id number] DOB: Aug 05, 1946 Today's Date: 07/13/2021    History of Present Illness Pt is a 75 y.o. male admitted with multiple readmissions from CIR for vascular intervention, now readmitted from CIR on 06/27/21 with R groin pseudoaneurysm. S/p exploration, wound vac placed 8/21. Pt with extensive bleeding 8/27 with return to OR emergently x2 s/p external iliac artery ligation and RLE bypass, excision of infected patch in R femoral artery. ETT 8/27-8/28. Plan for R AKA 9/6. 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   PT Comments    Pt progressing with mobility. Today's session focused on transfer training; pt requiring mod-maxA to perform lateral scoot transfer from bed to recliner with use of bed pad. Pt remains limited by generalized weakness, decreased activity tolerance, poor balance strategies/postural reactions, and cognitive impairment. Pt inconsistent with attention and command following. Continue to recommend intensive CIR-level therapies to maximize functional mobility and decrease caregiver burden prior to return home. Pending pt's activity tolerance post-op (R AKA scheduled 07/14/21), may need to consider SNF-level therapies.    Follow Up Recommendations  CIR     Equipment Recommendations  Wheelchair (measurements PT);Wheelchair cushion (measurements PT);Other Programme researcher, broadcasting/film/video)   Recommendations for Other Services       Precautions / Restrictions Precautions Precautions: Fall    Mobility  Bed Mobility Overal bed mobility: Needs Assistance Bed Mobility: Supine to Sit     Supine to sit: Mod assist;HOB elevated     General bed mobility comments: Repeated verbal cues to initiate and complete task, modA for HHA to elevate trunk, assist to scoot hips to EOB    Transfers Overall transfer level: Needs assistance Equipment used: None Transfers:  Lateral/Scoot Transfers          Lateral/Scoot Transfers: Max assist;+2 safety/equipment General transfer comment: Pt with good initiation of scoot from bed to drop arm recliner with BUE support and modA to scoot hips with bed pad; pt quick to become fatigued requiring maxA to complete scoot with bed pad, pt with BUE support holding onto therapist. Once seated in recliner, pt requiring minA to maxA+2 for scooting and repositioning in seat, max verbal cues for sequencing  Ambulation/Gait                 Stairs             Wheelchair Mobility    Modified Rankin (Stroke Patients Only)       Balance   Sitting-balance support: Feet unsupported;Bilateral upper extremity supported;No upper extremity supported Sitting balance-Leahy Scale: Poor Sitting balance - Comments: Reliant on UE support to maintain static sitting EOB and in recliner without back support Postural control: Posterior lean                                  Cognition Arousal/Alertness: Awake/alert Behavior During Therapy: Flat affect Overall Cognitive Status: Impaired/Different from baseline Area of Impairment: Attention;Memory;Following commands;Safety/judgement;Awareness;Problem solving                   Current Attention Level: Focused;Sustained Memory: Decreased short-term memory;Decreased recall of precautions Following Commands: Follows one step commands inconsistently;Follows one step commands with increased time Safety/Judgement: Decreased awareness of safety;Decreased awareness of deficits Awareness: Intellectual;Emergent Problem Solving: Slow processing;Decreased initiation;Difficulty sequencing;Requires verbal cues;Requires tactile cues General Comments: Pt aware he will have RLE procedure tomorrow. Pt inconsistent  with verbal responses in conversation and inconsistent command following; at times seems related to [facility], but other times does not. Apparent cognitive deficits  exacerbated by fatigue (?) towards end of session, pt closing eyes and not responding to questions requiring cues to wake up and attend to therapist      Exercises      General Comments        Pertinent Vitals/Pain Pain Assessment: Faces Faces Pain Scale: Hurts even more Pain Location: RLE residual limb Pain Descriptors / Indicators: Grimacing;Guarding;Moaning Pain Intervention(s): Monitored during session;Limited activity within patient's tolerance;Repositioned    Home Living                      Prior Function            PT Goals (current goals can now be found in the care plan section) Acute Rehab PT Goals Patient Stated Goal: none stated PT Goal Formulation: With patient Time For Goal Achievement: 07/27/21 Potential to Achieve Goals: Fair Progress towards PT goals: Progressing toward goals    Frequency    Min 3X/week      PT Plan Current plan remains appropriate    Co-evaluation              AM-PAC PT "6 Clicks" Mobility   Outcome Measure  Help needed turning from your back to your side while in a flat bed without using bedrails?: A Lot Help needed moving from lying on your back to sitting on the side of a flat bed without using bedrails?: A Lot Help needed moving to and from a bed to a chair (including a wheelchair)?: A Lot Help needed standing up from a chair using your arms (e.g., wheelchair or bedside chair)?: Total Help needed to walk in hospital room?: Total Help needed climbing 3-5 steps with a railing? : Total 6 Click Score: 9    End of Session Equipment Utilized During Treatment: Gait belt Activity Tolerance: Patient tolerated treatment well;Patient limited by fatigue Patient left: in chair;with call bell/phone within reach;with chair alarm set Nurse Communication: Mobility status;Need for lift equipment PT Visit Diagnosis: Other abnormalities of gait and mobility (R26.89);Pain Pain - Right/Left: Right Pain - part of body:  Leg;Knee     Time: 7425-9563 PT Time Calculation (min) (ACUTE ONLY): 26 min  Charges:  $Therapeutic Activity: 23-37 mins                     Ina Homes, PT, DPT Acute Rehabilitation Services  Pager [phone] Office [phone]  Malachy Chamber 07/13/2021, 3:50 PM

## 2021-07-13 NOTE — Progress Notes (Signed)
Progress Note  Patient Name: Todd Duran Date of Encounter: 07/13/2021  Primary Cardiologist: None   Subjective   R AKA planned for tomorrow. No CP.  Inpatient Medications    Scheduled Meds:  aspirin  81 mg Oral Daily   atorvastatin  40 mg Oral QHS   carvedilol  12.5 mg Oral BID WC   Chlorhexidine Gluconate Cloth  6 each Topical Q0600   clopidogrel  75 mg Oral Daily   dapagliflozin propanediol  10 mg Oral Daily   docusate sodium  100 mg Oral BID   feeding supplement  237 mL Oral BID BM   furosemide  40 mg Intravenous Daily   gabapentin  100 mg Oral TID   insulin aspart  0-15 Units Subcutaneous TID AC & HS   mouth rinse  15 mL Mouth Rinse BID   methocarbamol  750 mg Oral QID   multivitamin with minerals  1 tablet Oral Daily   pantoprazole  40 mg Oral Daily   polyethylene glycol  17 g Oral Daily   sacubitril-valsartan  1 tablet Oral BID   sennosides  10 mL Oral QHS   sodium chloride flush  10-40 mL Intracatheter Q12H   Continuous Infusions:  sodium chloride 10 mL/hr at 07/10/21 1900   ceFEPime (MAXIPIME) IV 2 g (07/13/21 0307)   magnesium sulfate bolus IVPB     PRN Meds: sodium chloride, acetaminophen **OR** acetaminophen, alum & mag hydroxide-simeth, hydrALAZINE, magnesium sulfate bolus IVPB, metoprolol tartrate, morphine injection, ondansetron, phenol, sodium chloride flush, traMADol   Vital Signs    Vitals:   07/12/21 1556 07/12/21 2019 07/13/21 0123 07/13/21 0436  BP: 108/75 103/72 132/87 133/84  Pulse:  95 100 100  Resp: 16 20 17 17   Temp: 99.4 F (37.4 C) 98.4 F (36.9 C) 98.9 F (37.2 C) (!) 97.5 F (36.4 C)  TempSrc: Oral Axillary Oral Oral  SpO2: 100% 97% 100% 100%  Weight:      Height:        Intake/Output Summary (Last 24 hours) at 07/13/2021 0717 Last data filed at 07/13/2021 0440 Gross per 24 hour  Intake 260 ml  Output 820 ml  Net -560 ml   Filed Weights   07/06/21 0300 07/07/21 0630 07/11/21 0452  Weight: 58.2 kg 57.7 kg 53.2 kg     Telemetry    SR and ST - Personally Reviewed  ECG    No new - Personally Reviewed  Physical Exam   GEN: No acute distress.   Neck: No JVD Cardiac: regular rhythm, normal rate, no murmurs, rubs, or gallops.  Respiratory: Clear to auscultation bilaterally. GI: Soft, nontender, non-distended  MS: bilateral amputation Neuro:  Nonfocal  Psych: Normal affect   Labs    Chemistry Recent Labs  Lab 07/11/21 0500 07/12/21 0127 07/13/21 0300  NA 133* 131* 135  K 4.2 3.8 3.7  CL 99 98 100  CO2 28 24 25   GLUCOSE 113* 143* 103*  BUN 19 23 21   CREATININE 0.60* 0.75 0.75  CALCIUM 8.8* 8.7* 8.8*  GFRNONAA >60 >60 >60  ANIONGAP 6 9 10      Hematology Recent Labs  Lab 07/08/21 0331 07/09/21 0313 07/11/21 0500  WBC 16.4* 14.0* 12.0*  RBC 2.98* 2.96* 2.86*  HGB 8.5* 8.4* 8.3*  HCT 27.0* 26.6* 25.6*  MCV 90.6 89.9 89.5  MCH 28.5 28.4 29.0  MCHC 31.5 31.6 32.4  RDW 15.9* 15.6* 15.1  PLT 366 354 378    Cardiac EnzymesNo results for input(s): TROPONINI  in the last 168 hours. No results for input(s): TROPIPOC in the last 168 hours.   BNP Recent Labs  Lab 07/09/21 0313  BNP 1,997.1*     DDimer No results for input(s): DDIMER in the last 168 hours.   Radiology    ECHOCARDIOGRAM LIMITED  Result Date: 07/12/2021    ECHOCARDIOGRAM LIMITED REPORT   Patient Name:   Todd Duran Date of Exam: 07/12/2021 Medical Rec #:  [id number]       Height:       53.0 in Accession #:    [id number]      Weight:       117.3 lb Date of Birth:  1946-07-31       BSA:          1.360 m Patient Age:    74 years        BP:           121/79 mmHg Patient Gender: M               HR:           97 bpm. Exam Location:  Inpatient Procedure: 2D Echo, Cardiac Doppler and Color Doppler Indications:    CHF  History:        Patient has prior history of Echocardiogram examinations, most                 recent 07/06/2021. Risk Factors:Diabetes and Hypertension.  Sonographer:    Neomia Dear RDCS Referring Phys:  [id number] Beatriz Stallion IMPRESSIONS  1. Left ventricular ejection fraction, by estimation, is 35 to 40%. The left ventricle has moderately decreased function. Left ventricular endocardial border not optimally defined to evaluate regional wall motion.  2. Right ventricular systolic function is normal. The right ventricular size is normal. There is normal pulmonary artery systolic pressure. The estimated right ventricular systolic pressure is 32.8 mmHg.  3. The mitral valve is abnormal. Moderate mitral valve regurgitation.  4. Tricuspid valve regurgitation is mild to moderate.  5. The aortic valve is abnormal. There is moderate calcification of the aortic valve. Aortic valve regurgitation is trivial. Mild aortic valve sclerosis is present, with no evidence of aortic valve stenosis. FINDINGS  Left Ventricle: Left ventricular ejection fraction, by estimation, is 35 to 40%. The left ventricle has moderately decreased function. Left ventricular endocardial border not optimally defined to evaluate regional wall motion.  LV Wall Scoring: The mid and distal lateral wall, mid anterolateral segment, apical septal segment, and basal inferoseptal segment are hypokinetic. Right Ventricle: The right ventricular size is normal. Right ventricular systolic function is normal. There is normal pulmonary artery systolic pressure. The tricuspid regurgitant velocity is 2.73 m/s, and with an assumed right atrial pressure of 3 mmHg,  the estimated right ventricular systolic pressure is 32.8 mmHg. Mitral Valve: The mitral valve is abnormal. Moderate mitral valve regurgitation. Tricuspid Valve: Tricuspid valve regurgitation is mild to moderate. Aortic Valve: The aortic valve is abnormal. There is moderate calcification of the aortic valve. Aortic valve regurgitation is trivial. Mild aortic valve sclerosis is present, with no evidence of aortic valve stenosis.  LV Volumes (MOD) LV vol d, MOD A2C: 110.0 ml LV vol d, MOD A4C: 117.0 ml LV vol  s, MOD A2C: 80.6 ml LV vol s, MOD A4C: 83.6 ml LV SV MOD A2C:     29.4 ml LV SV MOD A4C:     117.0 ml LV SV MOD BP:      29.6 ml TRICUSPID  VALVE TR Peak grad:   29.8 mmHg TR Vmax:        273.00 cm/s Weston Brass MD Electronically signed by Weston Brass MD Signature Date/Time: 07/12/2021/3:12:06 PM    Final     Cardiac Studies   Echo as above.  Patient Profile     75 y.o. male with a PMH of HTN, HLD, DM type 2, PAD s/p recent L AKA and most recent R BKA, and tobacco abuse, who is being seen 07/12/2021 for the evaluation of CHF.   Assessment & Plan   Principal Problem:   Acute hypercapnic respiratory failure (HCC) Active Problems:   Peripheral artery disease (HCC)   Pseudoaneurysm (HCC)   Encounter for postanesthesia care   Acute blood loss anemia   Pseudomonas infection   Wound infection after surgery   Malnutrition of moderate degree   Acute respiratory failure with hypoxia (HCC)   Pulmonary edema   1. Acute combined CHF:  - Severe proximal three-vessel coronary artery calcifications and aortic atherosclerosis on CT, suspect obstructive CAD with history of severe PAD, hypertension, diabetes, and tobacco abuse.  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 He was started on carvedilol and Entresto for GDMT. - Continue carvedilol and Entresto - Would benefit from addition of SGLT2 inhibitor - Consider addition of spironolactone if room in BP and creatinine prior to discharge. - Continue aspirin and plavix - Continue IV Lasix 40 mg daily - Continue to monitor strict I&O's and daily weights - will likely need coronary angiography if agreeable in near future. No urgency given no anginal symptoms and unlikely plan for coronary revascularization prior to completion of peripheral vascular surgical management.   2. MAT/SVT: having episodes of SVT with rates up to 140s at times. Patient is asymptomatic. HR appears to be in 100s over the past several days. Carvedilol uptitrated  this admission.  - Continue carvedilol with plans to uptitrate further if continued atrial tachycardia. Currently sinus tachycardia. - Order updated to utilize IV metoprolol prn for sustained SVT   3. PAD: Undergone multiple vascular surgery procedures for management of his severe bilateral PAD.  He is now s/p left AKA and right BKA the latter of which has been complicated by pseudoaneurysm and bleeding requiring multiple return trips to the OR for repair.  Appears there is some concern for poorly healing BKA wound and vascular team is considering AKA, now planned for 07/14/21.  He remains on IV cefepime for Pseudomonas growing in right groin wound.  He is on aspirin and Plavix. - Continue management per vascular surgery  4. HTN: BP has been stable - Managed in the context of #1  5. HLD: LDL 70 06/06/21 - Continue atorvastatin   6. DM type II: A1c 5.2 05/13/21 - Continue management per primary team   7. Tobacco abuse: smoked 1ppd x60 years. COPD likely contributor to MAT/atrial arrhythmias.      For questions or updates, please contact [facility] Please consult [email or web address] for contact info under        Signed, Parke Poisson, MD  07/13/2021, 7:17 AM

## 2021-07-13 NOTE — H&P (View-Only) (Signed)
   VASCULAR SURGERY ASSESSMENT & PLAN:   POD 9 LIGATION OF RIGHT FEMOROPOPLITEAL BYPASS GRAFT, RIGHT EXTERNAL ILIAC ARTERY, AND REMOVAL OF BOVINE PERICARDIAL PATCH: He has some drainage from his right BKA.    He will need a right above-the-knee amputation.  He is agreeable to proceed tomorrow.     ID: He is on IV Cefepime.  His right groin wound had grown Pseudomonas.   SUBJECTIVE:   He only complains of right knee pain  PHYSICAL EXAM:   Vitals:   07/12/21 2019 07/13/21 0123 07/13/21 0436 07/13/21 0814  BP: 103/72 132/87 133/84 131/84  Pulse: 95 100 100 (!) 102  Resp: 20 17 17 18   Temp: 98.4 F (36.9 C) 98.9 F (37.2 C) (!) 97.5 F (36.4 C) 97.6 F (36.4 C)  TempSrc: Axillary Oral Oral Oral  SpO2: 97% 100% 100% 100%  Weight:      Height:       He has some drainage from his right BKA which is clearly ischemic. His right groin incision looks good with minimal drainage  LABS:   Lab Results  Component Value Date   WBC 12.0 (H) 07/11/2021   HGB 8.3 (L) 07/11/2021   HCT 25.6 (L) 07/11/2021   MCV 89.5 07/11/2021   PLT 378 07/11/2021   Lab Results  Component Value Date   CREATININE 0.75 07/13/2021   Lab Results  Component Value Date   INR 1.2 06/30/2021   CBG (last 3)  Recent Labs    07/12/21 1748 07/12/21 2023 07/13/21 0609  GLUCAP 181* 110* 104*    PROBLEM LIST:    Principal Problem:   Acute hypercapnic respiratory failure (HCC) Active Problems:   Peripheral artery disease (HCC)   Pseudoaneurysm (HCC)   Encounter for postanesthesia care   Acute blood loss anemia   Pseudomonas infection   Wound infection after surgery   Malnutrition of moderate degree   Acute respiratory failure with hypoxia (HCC)   Pulmonary edema   CURRENT MEDS:    aspirin  81 mg Oral Daily   atorvastatin  40 mg Oral QHS   carvedilol  12.5 mg Oral BID WC   Chlorhexidine Gluconate Cloth  6 each Topical Q0600   clopidogrel  75 mg Oral Daily   dapagliflozin propanediol  10 mg  Oral Daily   docusate sodium  100 mg Oral BID   feeding supplement  237 mL Oral BID BM   furosemide  40 mg Intravenous Daily   gabapentin  100 mg Oral TID   insulin aspart  0-15 Units Subcutaneous TID AC & HS   mouth rinse  15 mL Mouth Rinse BID   methocarbamol  750 mg Oral QID   multivitamin with minerals  1 tablet Oral Daily   pantoprazole  40 mg Oral Daily   polyethylene glycol  17 g Oral Daily   sacubitril-valsartan  1 tablet Oral BID   sennosides  10 mL Oral QHS   sodium chloride flush  10-40 mL Intracatheter Q12H    09/12/21 Office: [phone] 07/13/2021

## 2021-07-13 NOTE — Progress Notes (Signed)
Obtained consent form and placed in pt's chart.   Domingos Riggi S Airyn Ellzey, RN  

## 2021-07-13 NOTE — Progress Notes (Signed)
[facility] PROGRESS NOTE   Todd Duran OEH:[id number] DOB: [birth year] DOA: 06/27/2021  PCP: Pcp, No  Brief History/Interval Summary: Patient is a 75 year old male with PMH of DM T2, HTN, bilateral BKA presents to [facility] on 8/20 with pseudoaneurysm of R femoral artery. On 7/13 patient received left BKA.  Patient was brought in on 7/29 for right toe amputation.  Right femoral to popliteal artery bypass grafting was done on 8/3.  On 8/13, patient presents with infected right foot and on 8/14 a right BKA was performed.   On 8/16 patient was sent to CIR.  On 8/20 vascular surgery consulted for bleeding on right groin and hemoglobin dropped to 6.9.  CT showing pseudoaneurysm right femoral artery.  On 8/20 vascular surgery performed surgery to explore pseudoaneurysm and repair of bovine pericardial patch angioplasty.  Wound VAC in place.  On 8/23, vascular surgery performed revision of right femoral retroperitoneal right iliac artery exposure, redo right femoral artery exposure, and endarterectomy and placement of multiple budgeted sutures for hemostasis.  Right femoral Prevena wound VAC postop cultures show Pseudomonas aeruginosa.  Started on cefepime.  On 8/26, acute bleed from right groin with VAC output of greater than 500 mL.  Performed right groin exploration and evacuation of right groin hematoma.  On 8/27, patient brought back to the OR for reexploration of right groin, ligation of right femoropopliteal vein bypass, ligation of right external iliac artery, excision of right bovine pericardial patch with excision of right common femoral artery, and negative pressure drain to right groin.  Patient was in the intensive care unit on mechanical ventilation.  Seen by the critical care medicine team.  Extubated.  However developed fluid overload for which she required BiPAP for a few days.  Seems to be stable.  Significant events: 8/27: right groin re-exploration and evacuation of hematoma, very  friable vessels, ultimately opted for ligation.  8/28 more comfortable. No signs of bleeding.  But ended up on BIPAP; 2/2 edema seemed to respond to edema  8/29 adding back home meds. Palliative consulted by primary team.  Echocardiogram: Ejection fraction 30 to 35%, global hypokinesis, LV mildly dilated.  Diastolic parameters consistent with grade 2 diastolic dysfunction.  RV size normal, RV function normal.  Lactate elevated at 4 8/30: Quiet evening.  Blood pressure under control.  Lactate cleared to 0.7 bedside and assessment of lower limb, seemed a little bit warmer on exam.  No distress. Overnight patient got SOB. Pulm edema given lasix and placed on bipap 8/31: patient got sob with pulm edema. Placed on bipap for a few hours and lasix given.  Antibiotics: Anti-infectives (From admission, onward)    Start     Dose/Rate Route Frequency Ordered Stop   07/02/21 1300  ceFEPIme (MAXIPIME) 2 g in sodium chloride 0.9 % 100 mL IVPB        2 g 200 mL/hr over 30 Minutes Intravenous Every 12 hours 07/02/21 1201 08/16/21 1259   06/28/21 0200  ceFAZolin (ANCEF) IVPB 2g/100 mL premix        2 g 200 mL/hr over 30 Minutes Intravenous Every 8 hours 06/27/21 2320 06/28/21 1002       Subjective/Interval History: Patient mentions that he is feeling well.  Denies any shortness of breath.  Pain in the right lower extremity is reasonably well controlled.  No nausea or vomiting.     Assessment/Plan:  Acute respiratory failure with hypoxia Most likely due to pulmonary edema from systolic CHF.  Required BiPAP for few  days.  Was aggressively diuresed with improvement.  He is saturating normal on room air.  Use BiPAP only if needed.  Continue with incentive spirometry.   Patient continues to be stable from a respiratory standpoint.  Okay to mobilize.  Acute systolic CHF Appears to be a new diagnosis.  Echocardiogram showed EF to be 30 to 35% with global hypokinesis.  Troponin elevation was noted which was  thought to be due to demand ischemia.   Patient was placed on IV diuretics.  Patient had diuresed well.  Weight has decreased.  He is off of oxygen.  Lasix was changed to once a day. Patient is also on aspirin, Plavix, statin, Entresto and carvedilol.   Telemetry shows sinus tachycardia.  Per cardiology there is concern for MAT as well.  Dose of beta-blocker was increased yesterday.  Heart rate seems to be better controlled.  May need to go up a little bit more on carvedilol but will leave it to cardiology.   PSA is noted to be 2.0.  Free T4 1.27.  No indication to initiate treatment based on these values.  Will recommend that thyroid function test be checked and in 4 to 6 weeks time.   Due to new CHF and abnormal EKG cardiology was consulted yesterday.  He likely does have coronary artery disease but he has multiple other active issues.  It does not appear that any invasive testing is planned for now.  Medical management is being pursued for now.  This may be reasonable since patient does not have any anginal symptoms.  Limited echocardiogram was repeated on 9/4 which showed EF to be 35 to 40%.  Essential hypertension/hyponatremia Blood pressure is reasonably well controlled. Drop in sodium level is likely due to furosemide.  Noted to be better today.  Continue to monitor.  Peripheral artery disease/status post recent right below-knee amputation Patient being followed by vascular surgery.  Had bleeding complications requiring ligation of the right femoral-popliteal vein bypass ligation of right external iliac artery and excision of right bovine pericardial patch with excision of right common femoral artery.  Has been stable.   Hemoglobin is stable.  On aspirin and Plavix.   Further management per vascular surgery.  It appears that plan is to take him back to the OR tomorrow for revision to above-knee amputation.  Pseudomonas infection right femoral area Cefepime was started on 8/25.  Plan is to give  it for 4 to 6 weeks per CCM notes. WBC has been improving.  Remains afebrile.  Patient has a PICC line in his right arm which was placed on 8/28. Will eventually need to discuss with ID regarding duration of treatment.    Acute blood loss anemia Hemoglobin has been stable for the last several days.  Recheck periodically.  Diabetes mellitus type 2, controlled Monitor CBGs.  SSI.  Goals of care Seen by palliative care.  Patient is DNR.  Sacral decubitus stage II Pressure Injury 06/05/21 Coccyx Mid;Other (Comment) Stage 2 -  Partial thickness loss of dermis presenting as a shallow open injury with a red, pink wound bed without slough. (Active)  06/05/21 2200  Location: Coccyx  Location Orientation: Mid;Other (Comment) (coccyx)  Staging: Stage 2 -  Partial thickness loss of dermis presenting as a shallow open injury with a red, pink wound bed without slough.  Wound Description (Comments):   Present on Admission: Yes   Obesity Estimated body mass index is 29.36 kg/m as calculated from the following:   Height as of  this encounter: 4\' 5"  (1.346 m).   Weight as of this encounter: 53.2 kg.  Moderate protein calorie malnutrition Nutrition Problem: Moderate Malnutrition Etiology: chronic illness (PVD/PAD)  Signs/Symptoms: moderate fat depletion, severe muscle depletion  Interventions: Ensure Enlive (each supplement provides 350kcal and 20 grams of protein)   DVT Prophylaxis: Being deferred to primary service.   Medications: Scheduled:  aspirin  81 mg Oral Daily   atorvastatin  40 mg Oral QHS   carvedilol  12.5 mg Oral BID WC   Chlorhexidine Gluconate Cloth  6 each Topical Q0600   clopidogrel  75 mg Oral Daily   dapagliflozin propanediol  10 mg Oral Daily   docusate sodium  100 mg Oral BID   feeding supplement  237 mL Oral BID BM   furosemide  40 mg Intravenous Daily   gabapentin  100 mg Oral TID   insulin aspart  0-15 Units Subcutaneous TID AC & HS   mouth rinse  15 mL Mouth  Rinse BID   methocarbamol  750 mg Oral QID   multivitamin with minerals  1 tablet Oral Daily   pantoprazole  40 mg Oral Daily   polyethylene glycol  17 g Oral Daily   sacubitril-valsartan  1 tablet Oral BID   sennosides  10 mL Oral QHS   sodium chloride flush  10-40 mL Intracatheter Q12H   Continuous:  sodium chloride 10 mL/hr at 07/10/21 1900   ceFEPime (MAXIPIME) IV 2 g (07/13/21 0307)   magnesium sulfate bolus IVPB     09/12/21 chloride, acetaminophen **OR** acetaminophen, alum & mag hydroxide-simeth, hydrALAZINE, magnesium sulfate bolus IVPB, metoprolol tartrate, morphine injection, ondansetron, phenol, sodium chloride flush, traMADol   Objective:  Vital Signs  Vitals:   07/12/21 2019 07/13/21 0123 07/13/21 0436 07/13/21 0814  BP: 103/72 132/87 133/84 131/84  Pulse: 95 100 100 (!) 102  Resp: 20 17 17 18   Temp: 98.4 F (36.9 C) 98.9 F (37.2 C) (!) 97.5 F (36.4 C) 97.6 F (36.4 C)  TempSrc: Axillary Oral Oral Oral  SpO2: 97% 100% 100% 100%  Weight:      Height:        Intake/Output Summary (Last 24 hours) at 07/13/2021 0947 Last data filed at 07/13/2021 0440 Gross per 24 hour  Intake 140 ml  Output 820 ml  Net -680 ml    Filed Weights   07/06/21 0300 07/07/21 0630 07/11/21 0452  Weight: 58.2 kg 57.7 kg 53.2 kg    General appearance: Awake alert.  In no distress Resp: Normal effort at rest.  A few crackles at the bases but mostly clear to auscultation. Cardio: S1-S2 is less tachycardic compared to yesterday.  No S3-S4.  No rubs or bruit.  Telemetry shows improvement in heart rate trends.   GI: Abdomen is soft.  Nontender nondistended.  Bowel sounds are present normal.  No masses organomegaly Extremities: Right BKA with [place].  Left AKA. Neurologic:  No focal neurological deficits.        Lab Results:  Data Reviewed: I have personally reviewed following labs and imaging studies  CBC: Recent Labs  Lab 07/07/21 0547 07/07/21 1800 07/08/21 0331  07/09/21 0313 07/11/21 0500  WBC 15.5* 25.2* 16.4* 14.0* 12.0*  HGB 8.9* 9.5* 8.5* 8.4* 8.3*  HCT 27.6* 30.1* 27.0* 26.6* 25.6*  MCV 88.2 91.8 90.6 89.9 89.5  PLT 331 462* 366 354 378     Basic Metabolic Panel: Recent Labs  Lab 07/07/21 0547 07/08/21 0331 07/09/21 0313 07/09/21 1414 07/10/21 0434  07/11/21 0500 07/12/21 0127 07/13/21 0300  NA 137   < > 135 135 133* 133* 131* 135  K 3.5   < > 3.6 4.3 3.8 4.2 3.8 3.7  CL 107   < > 102 102 101 99 98 100  CO2 22   < > 26 28 26 28 24 25   GLUCOSE 116*   < > 163* 178* 98 113* 143* 103*  BUN 17   < > 19 19 18 19 23 21   CREATININE 0.49*   < > 0.65 0.73 0.64 0.60* 0.75 0.75  CALCIUM 8.4*   < > 8.3* 8.7* 8.7* 8.8* 8.7* 8.8*  MG 1.9  --  1.6*  --  2.0  --   --   --   PHOS 2.4*  --  2.7  --  3.2  --   --   --    < > = values in this interval not displayed.     GFR: Estimated Creatinine Clearance: 48.4 mL/min (by C-G formula based on SCr of 0.75 mg/dL).    Cardiac Enzymes: Recent Labs  Lab 07/08/21 0754  CKTOTAL 253      CBG: Recent Labs  Lab 07/12/21 1320 07/12/21 1609 07/12/21 1748 07/12/21 2023 07/13/21 0609  GLUCAP 128* 165* 181* 110* 104*      Recent Results (from the past 240 hour(s))  MRSA Next Gen by PCR, Nasal     Status: None   Collection Time: 07/04/21  8:39 AM   Specimen: Nasal Mucosa; Nasal Swab  Result Value Ref Range Status   MRSA by PCR Next Gen NOT DETECTED NOT DETECTED Final    Comment: (NOTE) The GeneXpert MRSA Assay (FDA approved for NASAL specimens only), is one component of a comprehensive MRSA colonization surveillance program. It is not intended to diagnose MRSA infection nor to guide or monitor treatment for MRSA infections. Test performance is not FDA approved in patients less than 74 years old. Performed at [facility], [address] [place], [place] [address]   Surgical PCR screen     Status: None   Collection Time: 07/07/21  8:11 AM   Specimen: Nasal Mucosa; Nasal  Swab  Result Value Ref Range Status   MRSA, PCR NEGATIVE NEGATIVE Final   Staphylococcus aureus NEGATIVE NEGATIVE Final    Comment: (NOTE) The Xpert SA Assay (FDA approved for NASAL specimens in patients 74 years of age and older), is one component of a comprehensive surveillance program. It is not intended to diagnose infection nor to guide or monitor treatment. Performed at [facility], [address] [place], [place] [address]        Radiology Studies: ECHOCARDIOGRAM LIMITED  Result Date: 07/12/2021    ECHOCARDIOGRAM LIMITED REPORT   Patient Name:   Ellene RouteBILLY J Pallo Date of Exam: 07/12/2021 Medical Rec #:  [id number]       Height:       53.0 in Accession #:    [id number]      Weight:       117.3 lb Date of Birth:  01/09/1946       BSA:          1.360 m Patient Age:    74 years        BP:           121/79 mmHg Patient Gender: M               HR:  97 bpm. Exam Location:  Inpatient Procedure: 2D Echo, Cardiac Doppler and Color Doppler Indications:    CHF  History:        Patient has prior history of Echocardiogram examinations, most                 recent 07/06/2021. Risk Factors:Diabetes and Hypertension.  Sonographer:    Neomia Dear RDCS Referring Phys: [id number] Beatriz Stallion IMPRESSIONS  1. Left ventricular ejection fraction, by estimation, is 35 to 40%. The left ventricle has moderately decreased function. Left ventricular endocardial border not optimally defined to evaluate regional wall motion.  2. Right ventricular systolic function is normal. The right ventricular size is normal. There is normal pulmonary artery systolic pressure. The estimated right ventricular systolic pressure is 32.8 mmHg.  3. The mitral valve is abnormal. Moderate mitral valve regurgitation.  4. Tricuspid valve regurgitation is mild to moderate.  5. The aortic valve is abnormal. There is moderate calcification of the aortic valve. Aortic valve regurgitation is trivial. Mild aortic valve sclerosis is  present, with no evidence of aortic valve stenosis. FINDINGS  Left Ventricle: Left ventricular ejection fraction, by estimation, is 35 to 40%. The left ventricle has moderately decreased function. Left ventricular endocardial border not optimally defined to evaluate regional wall motion.  LV Wall Scoring: The mid and distal lateral wall, mid anterolateral segment, apical septal segment, and basal inferoseptal segment are hypokinetic. Right Ventricle: The right ventricular size is normal. Right ventricular systolic function is normal. There is normal pulmonary artery systolic pressure. The tricuspid regurgitant velocity is 2.73 m/s, and with an assumed right atrial pressure of 3 mmHg,  the estimated right ventricular systolic pressure is 32.8 mmHg. Mitral Valve: The mitral valve is abnormal. Moderate mitral valve regurgitation. Tricuspid Valve: Tricuspid valve regurgitation is mild to moderate. Aortic Valve: The aortic valve is abnormal. There is moderate calcification of the aortic valve. Aortic valve regurgitation is trivial. Mild aortic valve sclerosis is present, with no evidence of aortic valve stenosis.  LV Volumes (MOD) LV vol d, MOD A2C: 110.0 ml LV vol d, MOD A4C: 117.0 ml LV vol s, MOD A2C: 80.6 ml LV vol s, MOD A4C: 83.6 ml LV SV MOD A2C:     29.4 ml LV SV MOD A4C:     117.0 ml LV SV MOD BP:      29.6 ml TRICUSPID VALVE TR Peak grad:   29.8 mmHg TR Vmax:        273.00 cm/s Weston Brass MD Electronically signed by Weston Brass MD Signature Date/Time: 07/12/2021/3:12:06 PM    Final        LOS: 16 days   Osvaldo Shipper  [facility] Pager on [email or web address]  07/13/2021, 9:47 AM

## 2021-07-13 NOTE — Progress Notes (Signed)
   VASCULAR SURGERY ASSESSMENT & PLAN:   POD 9 LIGATION OF RIGHT FEMOROPOPLITEAL BYPASS GRAFT, RIGHT EXTERNAL ILIAC ARTERY, AND REMOVAL OF BOVINE PERICARDIAL PATCH: He has some drainage from his right BKA.    He will need a right above-the-knee amputation.  He is agreeable to proceed tomorrow.     ID: He is on IV Cefepime.  His right groin wound had grown Pseudomonas.   SUBJECTIVE:   He only complains of right knee pain  PHYSICAL EXAM:   Vitals:   07/12/21 2019 07/13/21 0123 07/13/21 0436 07/13/21 0814  BP: 103/72 132/87 133/84 131/84  Pulse: 95 100 100 (!) 102  Resp: 20 17 17 18  Temp: 98.4 F (36.9 C) 98.9 F (37.2 C) (!) 97.5 F (36.4 C) 97.6 F (36.4 C)  TempSrc: Axillary Oral Oral Oral  SpO2: 97% 100% 100% 100%  Weight:      Height:       He has some drainage from his right BKA which is clearly ischemic. His right groin incision looks good with minimal drainage  LABS:   Lab Results  Component Value Date   WBC 12.0 (H) 07/11/2021   HGB 8.3 (L) 07/11/2021   HCT 25.6 (L) 07/11/2021   MCV 89.5 07/11/2021   PLT 378 07/11/2021   Lab Results  Component Value Date   CREATININE 0.75 07/13/2021   Lab Results  Component Value Date   INR 1.2 06/30/2021   CBG (last 3)  Recent Labs    07/12/21 1748 07/12/21 2023 07/13/21 0609  GLUCAP 181* 110* 104*    PROBLEM LIST:    Principal Problem:   Acute hypercapnic respiratory failure (HCC) Active Problems:   Peripheral artery disease (HCC)   Pseudoaneurysm (HCC)   Encounter for postanesthesia care   Acute blood loss anemia   Pseudomonas infection   Wound infection after surgery   Malnutrition of moderate degree   Acute respiratory failure with hypoxia (HCC)   Pulmonary edema   CURRENT MEDS:    aspirin  81 mg Oral Daily   atorvastatin  40 mg Oral QHS   carvedilol  12.5 mg Oral BID WC   Chlorhexidine Gluconate Cloth  6 each Topical Q0600   clopidogrel  75 mg Oral Daily   dapagliflozin propanediol  10 mg  Oral Daily   docusate sodium  100 mg Oral BID   feeding supplement  237 mL Oral BID BM   furosemide  40 mg Intravenous Daily   gabapentin  100 mg Oral TID   insulin aspart  0-15 Units Subcutaneous TID AC & HS   mouth rinse  15 mL Mouth Rinse BID   methocarbamol  750 mg Oral QID   multivitamin with minerals  1 tablet Oral Daily   pantoprazole  40 mg Oral Daily   polyethylene glycol  17 g Oral Daily   sacubitril-valsartan  1 tablet Oral BID   sennosides  10 mL Oral QHS   sodium chloride flush  10-40 mL Intracatheter Q12H    Tytiana Coles Office: [phone] 07/13/2021  

## 2021-07-13 NOTE — Progress Notes (Signed)
Physical Therapy Treatment Patient Details Name: Todd Duran MRN: [id number] DOB: 1946/03/25 Today's Date: 07/13/2021    History of Present Illness Pt is a 75 y.o. male admitted with multiple readmissions from CIR for vascular intervention, now readmitted from CIR on 06/27/21 with R groin pseudoaneurysm. S/p exploration, wound vac placed 8/21. Pt with extensive bleeding 8/27 with return to OR emergently x2 s/p external iliac artery ligation and RLE bypass, excision of infected patch in R femoral artery. ETT 8/27-8/28. Plan for R AKA 9/6. 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   PT Comments    Pt seen for additional session for transfer from recliner to bed; pt with increased fatigue, requiring max-totalA for lateral scoot transfer. Pt inconsistently following commands, requiring max cues to attend to and complete task; difficult to determine extent of true cognitive impairment vs. fatigue vs. desire to participate vs. HOH. Continue to recommend post-acute rehab at [facility] pending pt's activity tolerance post-AKA scheduled for tomorrow.    Follow Up Recommendations  CIR     Equipment Recommendations  Wheelchair (measurements PT);Wheelchair cushion (measurements PT);Other (comment) (lift equipment)    Recommendations for Other Services Rehab consult     Precautions / Restrictions Precautions Precautions: Fall    Mobility  Bed Mobility Overal bed mobility: Needs Assistance Bed Mobility: Sit to Supine;Rolling Rolling: Mod assist   Supine to sit: Mod assist;HOB elevated Sit to supine: Mod assist   General bed mobility comments: ModA for trunk control and LE management with return to supine; pt requiring max verbal cues for sequencing to roll, eventually requiring modA to initiate movement, able to assist with UE support on rail, for bed pan placement; maxA+2 to scoot up in bed    Transfers Overall transfer level: Needs  assistance Equipment used: 1 person hand held assist Transfers: Lateral/Scoot Transfers          Lateral/Scoot Transfers: Max assist;+2 safety/equipment;Total assist General transfer comment: Pt with increased fatigue and difficulty assisting lateral scoot by pushing through BUEs; pt requiring max-totalA for lateral scoot from drop arm recliner to bed with BUE support on therapist and use of bed pad to control hips  Ambulation/Gait                 Stairs             Wheelchair Mobility    Modified Rankin (Stroke Patients Only)       Balance Overall balance assessment: Needs assistance Sitting-balance support: Feet unsupported;Bilateral upper extremity supported;No upper extremity supported Sitting balance-Leahy Scale: Poor Sitting balance - Comments: pt unable to maintain balance at EOB with UE support, or scoot hips back, requiring modA to maintain upright sitting Postural control: Posterior lean                                  Cognition Arousal/Alertness: Awake/alert;Lethargic Behavior During Therapy: Flat affect Overall Cognitive Status: Impaired/Different from baseline Area of Impairment: Attention;Memory;Following commands;Safety/judgement;Awareness;Problem solving                   Current Attention Level: Focused;Sustained Memory: Decreased short-term memory;Decreased recall of precautions Following Commands: Follows one step commands inconsistently;Follows one step commands with increased time Safety/Judgement: Decreased awareness of safety;Decreased awareness of deficits Awareness: Intellectual;Emergent Problem Solving: Slow processing;Decreased initiation;Difficulty sequencing;Requires verbal cues;Requires tactile cues General Comments: Increased time to follow commands, inconsistent. Pt at times keeping  eyes closed and not responding to questions/commands. Difficult to determine extent of true cognitive impairment vs. fatigue  vs. desire to participate vs. HOH      Exercises      General Comments        Pertinent Vitals/Pain Pain Assessment: Faces Faces Pain Scale: Hurts even more Pain Location: RLE residual limb Pain Descriptors / Indicators: Grimacing;Guarding;Moaning Pain Intervention(s): Monitored during session;Repositioned    Home Living                      Prior Function            PT Goals (current goals can now be found in the care plan section) Acute Rehab PT Goals Patient Stated Goal: none stated PT Goal Formulation: With patient Time For Goal Achievement: 07/27/21 Potential to Achieve Goals: Fair Progress towards PT goals: Progressing toward goals    Frequency    Min 3X/week      PT Plan Current plan remains appropriate    Co-evaluation              AM-PAC PT "6 Clicks" Mobility   Outcome Measure  Help needed turning from your back to your side while in a flat bed without using bedrails?: A Lot Help needed moving from lying on your back to sitting on the side of a flat bed without using bedrails?: A Lot Help needed moving to and from a bed to a chair (including a wheelchair)?: Total Help needed standing up from a chair using your arms (e.g., wheelchair or bedside chair)?: Total Help needed to walk in hospital room?: Total Help needed climbing 3-5 steps with a railing? : Total 6 Click Score: 8    End of Session Equipment Utilized During Treatment: Gait belt Activity Tolerance: Patient limited by fatigue Patient left: in bed;with call bell/phone within reach;with bed alarm set Nurse Communication: Mobility status;Need for lift equipment PT Visit Diagnosis: Other abnormalities of gait and mobility (R26.89);Pain Pain - Right/Left: Right Pain - part of body: Leg;Knee     Time: 1914-7829 PT Time Calculation (min) (ACUTE ONLY): 14 min  Charges:  $Therapeutic Activity: 8-22 mins                     Ina Homes, PT, DPT Acute Rehabilitation  Services  Pager [phone] Office [phone]  Malachy Chamber 07/13/2021, 5:42 PM

## 2021-07-13 NOTE — Progress Notes (Signed)
Inpatient Rehabilitation Admissions Coordinator   Noted plans for AKA. We continue to follow.  Ottie Glazier, RN, MSN Rehab Admissions Coordinator [phone] 07/13/2021 1:44 PM

## 2021-07-13 NOTE — Progress Notes (Signed)
Pt placed on bipap 15/5 and 40%. Currently sat 98. RT will cont to monitor.

## 2021-07-14 ENCOUNTER — Inpatient Hospital Stay (HOSPITAL_COMMUNITY): Payer: No Typology Code available for payment source | Admitting: Anesthesiology

## 2021-07-14 ENCOUNTER — Encounter (HOSPITAL_COMMUNITY): Payer: Self-pay | Admitting: Vascular Surgery

## 2021-07-14 ENCOUNTER — Encounter (HOSPITAL_COMMUNITY): Admission: EM | Disposition: A | Discharge status: Hospice | Payer: Self-pay | Attending: Surgery

## 2021-07-14 HISTORY — PX: AMPUTATION: SHX166

## 2021-07-14 HISTORY — PX: APPLICATION OF WOUND VAC: SHX5189

## 2021-07-14 HISTORY — PX: GROIN DEBRIDEMENT: SHX5159

## 2021-07-14 LAB — BASIC METABOLIC PANEL
Anion gap: 7 (ref 5–15)
BUN: 21 mg/dL (ref 8–23)
CO2: 26 mmol/L (ref 22–32)
Calcium: 8.8 mg/dL — ABNORMAL LOW (ref 8.9–10.3)
Chloride: 104 mmol/L (ref 98–111)
Creatinine, Ser: 0.76 mg/dL (ref 0.61–1.24)
GFR, Estimated: >60 mL/min (ref >60)
Glucose, Bld: 126 mg/dL — ABNORMAL HIGH (ref 70–99)
Potassium: 3.6 mmol/L (ref 3.5–5.1)
Sodium: 137 mmol/L (ref 135–145)

## 2021-07-14 LAB — CBC
HCT: 25.8 % — ABNORMAL LOW (ref 39.0–52.0)
Hemoglobin: 8.2 g/dL — ABNORMAL LOW (ref 13.0–17.0)
MCH: 28.2 pg (ref 26.0–34.0)
MCHC: 31.8 g/dL (ref 30.0–36.0)
MCV: 88.7 fL (ref 80.0–100.0)
Platelets: 419 K/uL — ABNORMAL HIGH (ref 150–400)
RBC: 2.91 MIL/uL — ABNORMAL LOW (ref 4.22–5.81)
RDW: 15.1 % (ref 11.5–15.5)
WBC: 10.7 K/uL — ABNORMAL HIGH (ref 4.0–10.5)
nRBC: 0 % (ref 0.0–0.2)

## 2021-07-14 LAB — GLUCOSE, CAPILLARY
Glucose-Capillary: 131 mg/dL — ABNORMAL HIGH (ref 70–99)
Glucose-Capillary: 121 mg/dL — ABNORMAL HIGH (ref 70–99)
Glucose-Capillary: 142 mg/dL — ABNORMAL HIGH (ref 70–99)
Glucose-Capillary: 132 mg/dL — ABNORMAL HIGH (ref 70–99)
Glucose-Capillary: 107 mg/dL — ABNORMAL HIGH (ref 70–99)
Glucose-Capillary: 141 mg/dL — ABNORMAL HIGH (ref 70–99)

## 2021-07-14 SURGERY — AMPUTATION, ABOVE KNEE
Anesthesia: General | Site: Leg Upper | Laterality: Right

## 2021-07-14 MED ORDER — BACITRACIN ZINC 500 UNIT/GM EX OINT
TOPICAL_OINTMENT | CUTANEOUS | Status: DC | PRN
  Administered 2021-07-14: 1 via TOPICAL

## 2021-07-14 MED ORDER — 0.9 % SODIUM CHLORIDE (POUR BTL) OPTIME
TOPICAL | Status: DC | PRN
  Administered 2021-07-14: 1000 mL

## 2021-07-14 MED ORDER — ONDANSETRON HCL 4 MG/2ML IJ SOLN
INTRAMUSCULAR | Status: DC | PRN
  Administered 2021-07-14: 4 mg via INTRAVENOUS

## 2021-07-14 MED ORDER — EPHEDRINE 5 MG/ML INJ
INTRAVENOUS | Status: AC
  Filled 2021-07-14: qty 5

## 2021-07-14 MED ORDER — PHENYLEPHRINE 40 MCG/ML (10ML) SYRINGE FOR IV PUSH (FOR BLOOD PRESSURE SUPPORT)
PREFILLED_SYRINGE | INTRAVENOUS | Status: DC | PRN
  Administered 2021-07-14 (×2): 80 ug via INTRAVENOUS
  Administered 2021-07-14 (×2): 120 ug via INTRAVENOUS

## 2021-07-14 MED ORDER — ENSURE ENLIVE PO LIQD
237.0000 mL | Freq: Three times a day (TID) | ORAL | Status: DC
  Administered 2021-07-14 – 2021-07-20 (×6): 237 mL via ORAL

## 2021-07-14 MED ORDER — CHLORHEXIDINE GLUCONATE 0.12 % MT SOLN
OROMUCOSAL | Status: AC
  Filled 2021-07-14: qty 15

## 2021-07-14 MED ORDER — ACETAMINOPHEN 500 MG PO TABS
ORAL_TABLET | ORAL | Status: AC
  Filled 2021-07-14: qty 2

## 2021-07-14 MED ORDER — ACETAMINOPHEN 500 MG PO TABS
1000.0000 mg | ORAL_TABLET | Freq: Once | ORAL | Status: AC
  Administered 2021-07-14: 1000 mg via ORAL

## 2021-07-14 MED ORDER — ROCURONIUM BROMIDE 10 MG/ML (PF) SYRINGE
PREFILLED_SYRINGE | INTRAVENOUS | Status: AC
  Filled 2021-07-14: qty 10

## 2021-07-14 MED ORDER — MIDAZOLAM HCL 2 MG/2ML IJ SOLN
INTRAMUSCULAR | Status: AC
  Filled 2021-07-14: qty 2

## 2021-07-14 MED ORDER — LACTATED RINGERS IV SOLN: INTRAVENOUS | Status: DC

## 2021-07-14 MED ORDER — FENTANYL CITRATE (PF) 250 MCG/5ML IJ SOLN
INTRAMUSCULAR | Status: AC
  Filled 2021-07-14: qty 5

## 2021-07-14 MED ORDER — LIDOCAINE HCL (CARDIAC) PF 100 MG/5ML IV SOSY
PREFILLED_SYRINGE | INTRAVENOUS | Status: DC | PRN
Start: 2021-07-14 — End: 2021-07-14
  Administered 2021-07-14: 40 mg via INTRAVENOUS

## 2021-07-14 MED ORDER — PHENYLEPHRINE 40 MCG/ML (10ML) SYRINGE FOR IV PUSH (FOR BLOOD PRESSURE SUPPORT)
PREFILLED_SYRINGE | INTRAVENOUS | Status: AC
  Filled 2021-07-14: qty 20

## 2021-07-14 MED ORDER — FENTANYL CITRATE (PF) 100 MCG/2ML IJ SOLN
INTRAMUSCULAR | Status: AC
  Filled 2021-07-14: qty 2

## 2021-07-14 MED ORDER — FENTANYL CITRATE (PF) 100 MCG/2ML IJ SOLN
INTRAMUSCULAR | Status: DC | PRN
  Administered 2021-07-14: 50 ug via INTRAVENOUS
  Administered 2021-07-14: 100 ug via INTRAVENOUS

## 2021-07-14 MED ORDER — ROCURONIUM BROMIDE 100 MG/10ML IV SOLN
INTRAVENOUS | Status: DC | PRN
  Administered 2021-07-14: 50 mg via INTRAVENOUS

## 2021-07-14 MED ORDER — SUGAMMADEX SODIUM 500 MG/5ML IV SOLN
INTRAVENOUS | Status: DC | PRN
Start: 2021-07-14 — End: 2021-07-14
  Administered 2021-07-14: 200 mg via INTRAVENOUS

## 2021-07-14 MED ORDER — ORAL CARE MOUTH RINSE: 15.0000 mL | Freq: Once | OROMUCOSAL | Status: AC

## 2021-07-14 MED ORDER — LIDOCAINE 2% (20 MG/ML) 5 ML SYRINGE
INTRAMUSCULAR | Status: AC
  Filled 2021-07-14: qty 5

## 2021-07-14 MED ORDER — EPHEDRINE SULFATE 50 MG/ML IJ SOLN
INTRAMUSCULAR | Status: DC | PRN
  Administered 2021-07-14: 5 mg via INTRAVENOUS
  Administered 2021-07-14 (×2): 10 mg via INTRAVENOUS

## 2021-07-14 MED ORDER — PROPOFOL 10 MG/ML IV BOLUS
INTRAVENOUS | Status: DC | PRN
  Administered 2021-07-14: 100 mg via INTRAVENOUS

## 2021-07-14 MED ORDER — ONDANSETRON HCL 4 MG/2ML IJ SOLN
INTRAMUSCULAR | Status: AC
  Filled 2021-07-14: qty 2

## 2021-07-14 MED ORDER — PHENYLEPHRINE HCL (PRESSORS) 10 MG/ML IV SOLN
INTRAVENOUS | Status: DC | PRN
  Administered 2021-07-14 (×3): 120 ug via INTRAVENOUS
  Administered 2021-07-14: 100 ug via INTRAVENOUS
  Administered 2021-07-14: 160 ug via INTRAVENOUS
  Administered 2021-07-14: 100 ug via INTRAVENOUS
  Administered 2021-07-14: 80 ug via INTRAVENOUS

## 2021-07-14 MED ORDER — CHLORHEXIDINE GLUCONATE 0.12 % MT SOLN
15.0000 mL | Freq: Once | OROMUCOSAL | Status: AC
  Administered 2021-07-14: 15 mL via OROMUCOSAL

## 2021-07-14 MED ORDER — PROPOFOL 10 MG/ML IV BOLUS
INTRAVENOUS | Status: AC
  Filled 2021-07-14: qty 20

## 2021-07-14 MED ORDER — BACITRACIN ZINC 500 UNIT/GM EX OINT
TOPICAL_OINTMENT | CUTANEOUS | Status: AC
  Filled 2021-07-14: qty 28.35

## 2021-07-14 SURGICAL SUPPLY — 52 items
BAG COUNTER SPONGE SURGICOUNT (BAG) ×4 IMPLANT
BAG SPNG CNTER NS LX DISP (BAG) ×3
BANDAGE ESMARK 6X9 LF (GAUZE/BANDAGES/DRESSINGS) ×3 IMPLANT
BLADE SAW RECIP 87.9 MT (BLADE) ×4 IMPLANT
BNDG CMPR 9X6 STRL LF SNTH (GAUZE/BANDAGES/DRESSINGS) ×3
BNDG COHESIVE 6X5 TAN STRL LF (GAUZE/BANDAGES/DRESSINGS) ×4 IMPLANT
BNDG ELASTIC 4X5.8 VLCR STR LF (GAUZE/BANDAGES/DRESSINGS) ×8 IMPLANT
BNDG ESMARK 6X9 LF (GAUZE/BANDAGES/DRESSINGS) ×4
BNDG GAUZE ELAST 4 BULKY (GAUZE/BANDAGES/DRESSINGS) ×4 IMPLANT
CANISTER SUCT 3000ML PPV (MISCELLANEOUS) ×4 IMPLANT
CANISTER WOUNDNEG PRESSURE 500 (CANNISTER) ×1 IMPLANT
CLIP TI LARGE 6 (CLIP) ×1 IMPLANT
CLIP TI MEDIUM 6 (CLIP) ×1 IMPLANT
COVER SURGICAL LIGHT HANDLE (MISCELLANEOUS) ×4 IMPLANT
DRAPE HALF SHEET 40X57 (DRAPES) ×4 IMPLANT
DRAPE ORTHO SPLIT 77X108 STRL (DRAPES) ×8
DRAPE SURG ORHT 6 SPLT 77X108 (DRAPES) ×6 IMPLANT
DRAPE U-SHAPE 47X51 STRL (DRAPES) ×4 IMPLANT
DRSG ADAPTIC 3X8 NADH LF (GAUZE/BANDAGES/DRESSINGS) ×4 IMPLANT
DRSG VAC ATS MED SENSATRAC (GAUZE/BANDAGES/DRESSINGS) ×1 IMPLANT
DRSG VERSA FOAM LRG 10X15 (GAUZE/BANDAGES/DRESSINGS) ×1 IMPLANT
ELECT CAUTERY BLADE 6.4 (BLADE) ×4 IMPLANT
ELECT REM PT RETURN 9FT ADLT (ELECTROSURGICAL) ×4
ELECTRODE REM PT RTRN 9FT ADLT (ELECTROSURGICAL) ×3 IMPLANT
GAUZE SPONGE 4X4 12PLY STRL (GAUZE/BANDAGES/DRESSINGS) ×4 IMPLANT
GLOVE SRG 8 PF TXTR STRL LF DI (GLOVE) ×3 IMPLANT
GLOVE SURG ENC MOIS LTX SZ7.5 (GLOVE) ×4 IMPLANT
GLOVE SURG UNDER POLY LF SZ8 (GLOVE) ×4
GOWN STRL REUS W/ TWL LRG LVL3 (GOWN DISPOSABLE) ×9 IMPLANT
GOWN STRL REUS W/TWL LRG LVL3 (GOWN DISPOSABLE) ×12
KIT BASIN OR (CUSTOM PROCEDURE TRAY) ×4 IMPLANT
KIT TURNOVER KIT B (KITS) ×4 IMPLANT
NS IRRIG 1000ML POUR BTL (IV SOLUTION) ×4 IMPLANT
PACK GENERAL/GYN (CUSTOM PROCEDURE TRAY) ×4 IMPLANT
PAD ARMBOARD 7.5X6 YLW CONV (MISCELLANEOUS) ×8 IMPLANT
SPONGE T-LAP 18X18 ~~LOC~~+RFID (SPONGE) ×3 IMPLANT
STAPLER VISISTAT 35W (STAPLE) ×4 IMPLANT
STOCKINETTE IMPERVIOUS LG (DRAPES) ×4 IMPLANT
SUT ETHILON 3 0 PS 1 (SUTURE) IMPLANT
SUT SILK 0 TIES 10X30 (SUTURE) ×5 IMPLANT
SUT SILK 2 0 (SUTURE) ×4
SUT SILK 2 0 SH CR/8 (SUTURE) ×5 IMPLANT
SUT SILK 2-0 18XBRD TIE 12 (SUTURE) ×3 IMPLANT
SUT SILK 3 0 (SUTURE) ×4
SUT SILK 3-0 18XBRD TIE 12 (SUTURE) ×3 IMPLANT
SUT VIC AB 2-0 CT1 18 (SUTURE) ×5 IMPLANT
SUT VIC AB 2-0 CT1 27 (SUTURE) ×4
SUT VIC AB 2-0 CT1 TAPERPNT 27 (SUTURE) IMPLANT
TOWEL GREEN STERILE (TOWEL DISPOSABLE) ×8 IMPLANT
TOWEL GREEN STERILE FF (TOWEL DISPOSABLE) ×4 IMPLANT
UNDERPAD 30X36 HEAVY ABSORB (UNDERPADS AND DIAPERS) ×5 IMPLANT
WATER STERILE IRR 1000ML POUR (IV SOLUTION) ×4 IMPLANT

## 2021-07-14 NOTE — Op Note (Signed)
    NAME: Todd Duran    MRN: [id number] DOB: 19-Apr-1946    DATE OF OPERATION: 07/14/2021  PREOP DIAGNOSIS:    Nonhealing right below the knee amputation  POSTOP DIAGNOSIS:    Same  PROCEDURE:    Debridement of right groin Placement of VAC Right above-the-knee amputation  SURGEON: Di Kindle. Edilia Bo, MD  ASSIST: Gerarda Fraction, MD  ANESTHESIA: General  EBL: Minimal  INDICATIONS:    Todd Duran is a 75 y.o. male with a complicated history.  He is undergone a previous below the knee amputation but required ligation of his external iliac artery for bleeding and presents with a nonhealing right below the knee amputation.  FINDINGS:   There was some separation of the incision in the groin.  We therefore remove the staples and open this incision and debrided back to healthier appearing tissue.  VAC was applied over a white sponge. The muscle at the level of the amputation fortunately appeared reasonably well perfused.  TECHNIQUE:   The patient was taken to the operating room and received a general anesthetic.  The lower abdomen right groin and entire right lower extremity were prepped and draped in usual sterile fashion.  The wound had separated some approximately and we therefore removed all the staples and open the incision.  There was some tissue that did not appear viable especially along the medial aspect of the wound and this was sharply debrided with scissors.  This included debridement of subcutaneous tissue.  Once we got back to healthy tissue.  Hemostasis was obtained.  A white sponge was placed over the vein graft which had been ligated.  A VAC was then applied.  Attention was then turned to the right above-the-knee amputation.  The incision was marked well above the patella.  Incision was carried down through the skin with a scalpel and then the remainder of the dissection was done using electrocautery.  The dissection was carried down through the subcutaneous  tissue fascia and muscle to the femur which was dissected free circumferentially.  Medially dissection was carried down to the superficial femoral artery and femoral vein which were individually identified dissected free and then divided between 2-0 silk sutures.  The bone was then divided after the periosteum had been elevated and the posterior muscle was then divided using knife.  Hemostasis was obtained using electrocautery and 2-0 silk sutures.  The edges of the bone were rasped.  The fascial layer was then closed with interrupted 2-0 Vicryl's and the skin closed with staples.  A sterile dressing was applied.  The patient tolerated the procedure well was transferred to recovery in stable condition.  All needle and sponge counts were correct.    Given the complexity of the case a first assistant was necessary in order to expedient the procedure and safely perform the technical aspects of the operation.  Waverly Ferrari, MD, FACS [facility]  DATE OF DICTATION:   07/14/2021

## 2021-07-14 NOTE — Transfer of Care (Signed)
Immediate Anesthesia Transfer of Care Note  Patient: Todd Duran  Procedure(s) Performed: AMPUTATION ABOVE KNEE (Right: Knee) APPLICATION OF WOUND VAC (Right: Leg Upper) GROIN DEBRIDEMENT (Right: Groin)  Patient Location: PACU  Anesthesia Type:General  Level of Consciousness: awake, alert  and patient cooperative  Airway & Oxygen Therapy: Patient Spontanous Breathing and Patient connected to face mask oxygen  Post-op Assessment: Report given to RN and Post -op Vital signs reviewed and stable  Post vital signs: Reviewed and stable  Last Vitals:  Vitals Value Taken Time  BP 117/73 07/14/21 1142  Temp    Pulse    Resp 22 07/14/21 1144  SpO2    Vitals shown include unvalidated device data.  Last Pain:  Vitals:   07/14/21 0910  TempSrc:   PainSc: 0-No pain      Patients Stated Pain Goal: 2 (07/14/21 0910)  Complications: No notable events documented.

## 2021-07-14 NOTE — Progress Notes (Signed)
Received verbal order from PA for ok to leave the pt off tele for surgery. Unable to follow up with vital signs for yellow MEWs protocol since pt were off floor to OR. Will continue to monitor the pt when pt back from OR.   Lawson Radar, RN

## 2021-07-14 NOTE — Anesthesia Postprocedure Evaluation (Signed)
Anesthesia Post Note  Patient: Todd Duran  Procedure(s) Performed: AMPUTATION ABOVE KNEE (Right: Knee) APPLICATION OF WOUND VAC (Right: Leg Upper) GROIN DEBRIDEMENT (Right: Groin)     Patient location during evaluation: PACU Anesthesia Type: General Level of consciousness: awake and alert, oriented and patient cooperative Pain management: pain level controlled Vital Signs Assessment: post-procedure vital signs reviewed and stable Respiratory status: spontaneous breathing, nonlabored ventilation and respiratory function stable Cardiovascular status: blood pressure returned to baseline and stable Postop Assessment: no apparent nausea or vomiting Anesthetic complications: no   No notable events documented.  Last Vitals:  Vitals:   07/14/21 1157 07/14/21 1212  BP: 124/80 128/80  Pulse: (!) 104 (!) 102  Resp: 12 13  Temp:  36.6 C  SpO2: 100% 100%    Last Pain:  Vitals:   07/14/21 1212  TempSrc:   PainSc: 0-No pain        RLE Motor Response: Purposeful movement;Responds to commands (07/14/21 1212) RLE Sensation: Full sensation (07/14/21 1212)      Lannie Fields

## 2021-07-14 NOTE — Interval H&P Note (Signed)
History and Physical Interval Note:  07/14/2021 9:17 AM  [name]  has presented today for surgery, with the diagnosis of DIABETES.  The various methods of treatment have been discussed with the patient and family. After consideration of risks, benefits and other options for treatment, the patient has consented to  Procedure(s): AMPUTATION ABOVE KNEE (Right) as a surgical intervention.  The patient's history has been reviewed, patient examined, no change in status, stable for surgery.  I have reviewed the patient's chart and labs.  Questions were answered to the patient's satisfaction.     Todd Duran

## 2021-07-14 NOTE — Anesthesia Preprocedure Evaluation (Addendum)
Anesthesia Evaluation  Patient identified by MRN, date of birth, ID band Patient awake    Reviewed: Allergy & Precautions, NPO status , Patient's Chart, lab work & pertinent test results, reviewed documented beta blocker date and time   Airway Mallampati: I  TM Distance: >3 FB Neck ROM: Full    Dental  (+) Missing, Dental Advisory Given   Pulmonary Current Smoker, former smoker,  Former smoker, 1ppd x long time   Pulmonary exam normal breath sounds clear to auscultation       Cardiovascular hypertension, Pt. on medications and Pt. on home beta blockers + Peripheral Vascular Disease and +CHF (LVEF 35-40%)  Normal cardiovascular exam+ Valvular Problems/Murmurs (mod MR) MR  Rhythm:Regular Rate:Normal  Echo 07/12/21: 1. Left ventricular ejection fraction, by estimation, is 35 to 40%. The  left ventricle has moderately decreased function. Left ventricular  endocardial border not optimally defined to evaluate regional wall motion.  2. Right ventricular systolic function is normal. The right ventricular  size is normal. There is normal pulmonary artery systolic pressure. The  estimated right ventricular systolic pressure is 32.8 mmHg.  3. The mitral valve is abnormal. Moderate mitral valve regurgitation.  4. Tricuspid valve regurgitation is mild to moderate.  5. The aortic valve is abnormal. There is moderate calcification of the  aortic valve. Aortic valve regurgitation is trivial. Mild aortic valve  sclerosis is present, with no evidence of aortic valve stenosis.    Neuro/Psych negative neurological ROS  negative psych ROS   GI/Hepatic negative GI ROS, Neg liver ROS,   Endo/Other  diabetes, Poorly Controlled, Type 2, Oral Hypoglycemic Agents  Renal/GU negative Renal ROS  negative genitourinary   Musculoskeletal negative musculoskeletal ROS (+)   Abdominal   Peds  Hematology  (+) Blood dyscrasia, anemia , hct 25.8,  plt 419   Anesthesia Other Findings   Reproductive/Obstetrics negative OB ROS                           Anesthesia Physical Anesthesia Plan  ASA: 3  Anesthesia Plan: General   Post-op Pain Management:    Induction: Intravenous  PONV Risk Score and Plan: 1 and Ondansetron, Dexamethasone and Treatment may vary due to age or medical condition  Airway Management Planned: Oral ETT  Additional Equipment: None  Intra-op Plan:   Post-operative Plan: Extubation in OR  Informed Consent: I have reviewed the patients History and Physical, chart, labs and discussed the procedure including the risks, benefits and alternatives for the proposed anesthesia with the patient or authorized representative who has indicated his/her understanding and acceptance.   Patient has DNR.  Discussed DNR with patient, Discussed DNR with power of attorney and Suspend DNR.   Dental advisory given  Plan Discussed with: CRNA  Anesthesia Plan Comments: (D/w pt DNR- pt wanted me to discuss this with his wife and sister- both agreed to suspend DNR perioperatively)      Anesthesia Quick Evaluation

## 2021-07-14 NOTE — Progress Notes (Signed)
Dr Edilia Bo informed last dose of plavix was 07/13/21.  Ok for surgery, no orders given.

## 2021-07-14 NOTE — Progress Notes (Signed)
Pt came back to rm 24 from PACU on continuous wound VAC  125 mmg. CHG wipe performed, reinitiated tele. VSS. Call bell within reach.   Lawson Radar, RN

## 2021-07-14 NOTE — Anesthesia Procedure Notes (Signed)
Procedure Name: Intubation Date/Time: 07/14/2021 10:33 AM Performed by: Jonna Munro, CRNA Pre-anesthesia Checklist: Patient identified, Emergency Drugs available, Suction available, Patient being monitored and Timeout performed Patient Re-evaluated:Patient Re-evaluated prior to induction Oxygen Delivery Method: Circle system utilized Preoxygenation: Pre-oxygenation with 100% oxygen Induction Type: IV induction Ventilation: Mask ventilation without difficulty Laryngoscope Size: Mac and 4 Grade View: Grade I Tube type: Oral Tube size: 7.5 mm Number of attempts: 1 Airway Equipment and Method: Stylet Placement Confirmation: ETT inserted through vocal cords under direct vision, positive ETCO2 and breath sounds checked- equal and bilateral Secured at: 24 cm Tube secured with: Tape Dental Injury: Teeth and Oropharynx as per pre-operative assessment

## 2021-07-14 NOTE — Progress Notes (Signed)
Inpatient Rehab Admissions Coordinator:   I do not have a CIR bed for this Pt. Today. Pt. At procedure when I went to provide update. I will touch base with Pt. Or family tomorrow.   Megan Salon, MS, CCC-SLP Rehab Admissions Coordinator  [phone] (celll) [phone] (office)

## 2021-07-14 NOTE — Progress Notes (Signed)
Occupational Therapy Treatment Patient Details Name: Todd Duran MRN: [id number] DOB: 1946/01/27 Today's Date: 07/14/2021    History of present illness Pt is a 75 y.o. male admitted with multiple readmissions from CIR for vascular intervention, now readmitted from CIR on 06/27/21 with R groin pseudoaneurysm. S/p exploration, wound vac placed 8/21. Pt with extensive bleeding 8/27 with return to OR emergently x2 s/p external iliac artery ligation and RLE bypass, excision of infected patch in R femoral artery. ETT 8/27-8/28. Plan for R AKA 9/6. 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   OT comments  Pt with slower progress towards OT goals (goal dates extended accordingly), limited by lethargy and back pain today. Pt overall Mod to Max A for bed mobility. Planned to further assess balance and sequencing basic ADLs sitting EOB though unable to successfully sit EOB. Pt was able to demo proper sequencing of basic ADLs bed level after min cues for initiation of tasks. Will follow-up to re-assess functional abilities after planned R AKA today. Will need to consider SNF if remains slow to progress towards goals.   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  Max A to get back to supine to scoot hips over. Pt able to assist in scooting up via bedrails at Mod A    Transfers                 General transfer comment: deferred due to pending transport for surgery    Balance Overall balance assessment: Needs assistance Sitting-balance support: Feet unsupported;Bilateral upper extremity supported;No upper extremity supported Sitting balance-Leahy Scale: Poor                                     ADL either performed or assessed with clinical judgement   ADL Overall ADL's : Needs assistance/impaired     Grooming: Min guard;Bed level;Wash/dry face;Oral care;Applying deodorant Grooming Details (indicate cue type and reason): attempted EOB though pt with slow processing and reports of back pain with mobility. Pt able to sequence basic ADLs with min cues for initiation and problem solving                               General ADL Comments: Session focused on assessing cognition with basic ADLs. Hoped to complete tasks sitting EOB to provide balance challeneg     Vision   Vision Assessment?: No apparent visual deficits   Perception     Praxis      Cognition Arousal/Alertness: Awake/alert;Lethargic Behavior During Therapy: Flat affect Overall Cognitive Status: Impaired/Different from baseline Area of Impairment: Attention;Memory;Following commands;Safety/judgement;Awareness;Problem solving;Orientation  Orientation Level: Disoriented to;Situation Current Attention Level: Sustained Memory: Decreased short-term memory;Decreased recall of precautions Following Commands: Follows one step commands inconsistently;Follows one step commands with increased time Safety/Judgement: Decreased awareness of safety;Decreased awareness of deficits Awareness: Emergent Problem Solving: Slow processing;Decreased initiation;Difficulty sequencing;Requires verbal cues;Requires tactile cues General Comments:  Increased time to follow commands, inconsistent. Pt at times keeping eyes closed and not responding to questions/commands. Often reports "wait a minute" when therapist assisting with tasks.        Exercises     Shoulder Instructions       General Comments Pt requesting to call wife - assisted in dialing room phone though no answer. Pt cell phone dead, so OT plugged into wall. Noted R LE weeping on bed pads    Pertinent Vitals/ Pain       Pain Assessment: Faces Faces Pain Scale: Hurts even more Pain Location: back Pain Descriptors / Indicators: Grimacing;Guarding;Moaning Pain Intervention(s): Monitored during session;Limited activity within patient's tolerance  Home Living                                          Prior Functioning/Environment              Frequency  Min 2X/week        Progress Toward Goals  OT Goals(current goals can now be found in the care plan section)  Progress towards OT goals: Progressing toward goals  Acute Rehab OT Goals Patient Stated Goal: none stated OT Goal Formulation: With patient Time For Goal Achievement: 07/13/21 Potential to Achieve Goals: Fair ADL Goals Pt Will Perform Grooming: with modified independence;sitting Pt Will Perform Lower Body Bathing: with min guard assist;sitting/lateral leans Pt Will Perform Lower Body Dressing: with min guard assist;sitting/lateral leans Pt Will Transfer to Toilet: with min guard assist;anterior/posterior transfer;bedside commode Pt Will Perform Toileting - Clothing Manipulation and hygiene: with modified independence;sitting/lateral leans Pt Will Perform Tub/Shower Transfer: Tub transfer;with supervision;tub bench Pt/caregiver will Perform Home Exercise Program: Increased strength;Both right and left upper extremity;Independently;With written HEP provided Additional ADL Goal #1: Pt to verbalize at least 2 fall prevention strategies to maximize safety during ADLs/transfers   Plan Discharge plan remains appropriate;Frequency remains appropriate    Co-evaluation                 AM-PAC OT "6 Clicks" Daily Activity     Outcome Measure   Help from another person eating meals?: A Little Help from another person taking care of personal grooming?: A Little Help from another person toileting, which includes using toliet, bedpan, or urinal?: Total Help from another person bathing (including washing, rinsing, drying)?: A Lot Help from another person to put on and taking off regular upper body clothing?: A Little Help from another person to put on and taking off regular lower body clothing?: Total 6 Click Score: 13    End of Session    OT Visit Diagnosis: Other abnormalities of gait and mobility (R26.89);Muscle weakness (generalized) (M62.81);Pain;Other symptoms and signs involving cognitive function Pain - Right/Left: Right Pain - part of body: Leg   Activity Tolerance Patient limited by fatigue   Patient Left in bed;with call bell/phone within reach;with bed alarm set;with nursing/sitter in room   Nurse Communication Mobility status        Time: 1950-9326 OT Time Calculation (min): 16 min  Charges: OT General Charges $OT Visit: 1 Visit OT Treatments $  Self Care/Home Management : 8-22 mins  Bradd Canary, OTR/L Acute Rehab Services Office: [phone]    Lorre Munroe 07/14/2021, 8:10 AM

## 2021-07-14 NOTE — Progress Notes (Signed)
Nutrition Follow-up  DOCUMENTATION CODES:  Non-severe (moderate) malnutrition in context of chronic illness  INTERVENTION:  Recommend liberalizing diet to regular.  Increase Ensure from BID to TID.  Continue MVI with minerals daily.  NUTRITION DIAGNOSIS:  Moderate Malnutrition related to chronic illness (PVD/PAD) as evidenced by moderate fat depletion, severe muscle depletion. - ongoing  GOAL:  Patient will meet greater than or equal to 90% of their needs - progressing  MONITOR:  PO intake, Supplement acceptance, Weight trends, Labs, I & O's  REASON FOR ASSESSMENT:  Consult Enteral/tube feeding initiation and management  ASSESSMENT: Patient with PMH significant for PAD, L BKA, PVD, DM, and HTN. Presents this admission with large pseudoaneurysm of the R groin. 8/14 - R BKA 8/20 - s/p retroperitoneal exposure of R iliac artery, exploration of R groin, repair of bovine pericardial patch angioplasty 8/23 - s/p redo R femoral artery exposure, revision of R femoral endarterectomy, R femoral Wound VAC 8/27 - s/p R groin exploration with common femoral repair, evacuation of R groin hematoma, reexploration later in the day 8/28 - extubated  9/6 -  R AKA, wound vac on R thigh, groin debridement   Per Epic, pt eating ~64% of meals over the last 8 meals documented. Spoke with pt and wife at bedside after R AKA.  Pt with poor oral intake and would benefit from nutrient dense supplement. One Ensure Enlive supplement provides 350 kcals, 20 grams protein, and 44-45 grams of carbohydrate vs one Glucerna shake supplement, which provides 220 kcals, 10 grams of protein, and 26 grams of carbohydrate. Given pt's hx of DM, RD will reassess adequacy of PO intake, CBGS, and adjust supplement regimen as appropriate at follow-up.    Pt in good spirits, still a bit confused from sedation. Wife reports that he is ready to order lunch.  Admit wt: 57.5 kg Current wt: 56.7 kg (pre R AKA)  Supplements: EE  BID  Medications: reviewed; colace BID, Lasix, SSI, MVI with minerals, Protonix, miralax, Senokot, Mag Sulfate daily PRN per IV  Labs: reviewed; CBG 121-159 (H) HbA1c: 5.2% (05/13/2021)  Diet Order:   Diet Order             Diet NPO time specified Except for: Sips with Meds  Diet effective midnight                  EDUCATION NEEDS:  Education needs have been addressed  Skin:  Skin Assessment: Skin Integrity Issues: Skin Integrity Issues:: Stage II, Incisions, Wound VAC Stage II: Coccyx Wound Vac: R thigh Incisions: multiple  Last BM:  07/13/21 - Type 5, large  Height:  Ht Readings from Last 1 Encounters:  07/14/21 5\' 11"  (1.803 m)   Weight:  Wt Readings from Last 1 Encounters:  07/14/21 56.7 kg   BMI:  Body mass index is 17.43 kg/m.  Estimated Nutritional Needs:  Kcal:  1800-2000 kcal Protein:  100-120 grams Fluid:  >/= 1.8 L/day  09/13/21, RD, LDN (she/her/hers) Registered Dietitian I After-Hours/Weekend Pager # in [place]

## 2021-07-14 NOTE — Progress Notes (Addendum)
[facility] PROGRESS NOTE   Todd Duran WLN:[id number] DOB: October 06, 1946 DOA: 06/27/2021  PCP: Pcp, No  Brief History/Interval Summary: Patient is a 75 year old male with PMH of DM T2, HTN, bilateral BKA presents to [facility] on 8/20 with pseudoaneurysm of R femoral artery. On 7/13 patient received left BKA.  Patient was brought in on 7/29 for right toe amputation.  Right femoral to popliteal artery bypass grafting was done on 8/3.  On 8/13, patient presents with infected right foot and on 8/14 a right BKA was performed.   On 8/16 patient was sent to CIR.  On 8/20 vascular surgery consulted for bleeding on right groin and hemoglobin dropped to 6.9.  CT showing pseudoaneurysm right femoral artery.  On 8/20 vascular surgery performed surgery to explore pseudoaneurysm and repair of bovine pericardial patch angioplasty.  Wound VAC in place.  On 8/23, vascular surgery performed revision of right femoral retroperitoneal right iliac artery exposure, redo right femoral artery exposure, and endarterectomy and placement of multiple budgeted sutures for hemostasis.  Right femoral Prevena wound VAC postop cultures show Pseudomonas aeruginosa.  Started on cefepime.  On 8/26, acute bleed from right groin with VAC output of greater than 500 mL.  Performed right groin exploration and evacuation of right groin hematoma.  On 8/27, patient brought back to the OR for reexploration of right groin, ligation of right femoropopliteal vein bypass, ligation of right external iliac artery, excision of right bovine pericardial patch with excision of right common femoral artery, and negative pressure drain to right groin.  Patient was in the intensive care unit on mechanical ventilation.  Seen by the critical care medicine team.  Extubated.  However developed fluid overload for which she required BiPAP for a few days.  Seems to be stable.  Significant events: 8/27: right groin re-exploration and evacuation of hematoma, very  friable vessels, ultimately opted for ligation.  8/28 more comfortable. No signs of bleeding.  But ended up on BIPAP; 2/2 edema seemed to respond to edema  8/29 adding back home meds. Palliative consulted by primary team.  Echocardiogram: Ejection fraction 30 to 35%, global hypokinesis, LV mildly dilated.  Diastolic parameters consistent with grade 2 diastolic dysfunction.  RV size normal, RV function normal.  Lactate elevated at 4 8/30: Quiet evening.  Blood pressure under control.  Lactate cleared to 0.7 bedside and assessment of lower limb, seemed a little bit warmer on exam.  No distress. Overnight patient got SOB. Pulm edema given lasix and placed on bipap 8/31: patient got sob with pulm edema. Placed on bipap for a few hours and lasix given. 9/6: Plan is for revision of the right BKA  Antibiotics: Anti-infectives (From admission, onward)    Start     Dose/Rate Route Frequency Ordered Stop   07/02/21 1300  ceFEPIme (MAXIPIME) 2 g in sodium chloride 0.9 % 100 mL IVPB        2 g 200 mL/hr over 30 Minutes Intravenous Every 12 hours 07/02/21 1201 08/16/21 1259   06/28/21 0200  ceFAZolin (ANCEF) IVPB 2g/100 mL premix        2 g 200 mL/hr over 30 Minutes Intravenous Every 8 hours 06/27/21 2320 06/28/21 1002       Subjective/Interval History: Patient denies any new complaints this morning.  No chest pain or shortness of breath.  No nausea or vomiting.  Understands that he is going to go for surgery this morning.   Assessment/Plan:  Acute respiratory failure with hypoxia Most likely due to  pulmonary edema from systolic CHF.  Required BiPAP for few days.  Was aggressively diuresed with improvement.  He is saturating normal on room air.  Use BiPAP only if needed.  Continue with incentive spirometry.   Patient's respiratory status continues to be stable.  Acute systolic CHF Appears to be a new diagnosis.  Echocardiogram showed EF to be 30 to 35% with global hypokinesis.  Troponin elevation  was noted which was thought to be due to demand ischemia.   Patient was placed on IV diuretics.  Patient had diuresed well.  Weight has decreased.  He is off of oxygen.  Lasix was changed to once a day. Patient is also on aspirin, Plavix, statin, Entresto and carvedilol.   Telemetry shows sinus tachycardia.  Per cardiology there is concern for MAT as well.   Patient is on carvedilol the dose of which was increased.  May need to increase it further.  We will let cardiology weigh in as well. TSH is noted to be 2.0.  Free T4 1.27.  No indication to initiate treatment based on these values.  Will recommend that thyroid function test be checked and in 4 to 6 weeks time.   Due to new CHF and abnormal EKG cardiology was consulted.  He likely does have coronary artery disease but he has multiple other active issues.  It does not appear that any invasive testing is planned for now.  Medical management is being pursued for now.  This may be reasonable since patient does not have any anginal symptoms.  Limited echocardiogram was repeated on 9/4 which showed EF to be 35 to 40%.  Essential hypertension/hyponatremia Blood pressure is reasonably well controlled.  Sodium level is normal now.    Peripheral artery disease/status post recent right below-knee amputation Patient being followed by vascular surgery.  Had bleeding complications requiring ligation of the right femoral-popliteal vein bypass ligation of right external iliac artery and excision of right bovine pericardial patch with excision of right common femoral artery.  Has been stable.   Hemoglobin is stable.  On aspirin and Plavix.   Further management per vascular surgery.   Plan is for revision to her right above-knee amputation today.    Pseudomonas infection right femoral area Cefepime was started on 8/25.  Plan is to give it for 4 to 6 weeks per CCM notes. WBC has been improving.  Remains afebrile.  Patient has a PICC line in his right arm which  was placed on 8/28. Will eventually need to discuss with ID regarding duration of treatment.  This can be done closer to discharge.  Acute blood loss anemia Hemoglobin has been stable for the last several days.  Recheck periodically.  Diabetes mellitus type 2, controlled CBGs are stable.  Continue SSI.  HbA1c 5.2 in July.  Goals of care Seen by palliative care.  Patient is DNR.  Sacral decubitus stage II Pressure Injury 06/05/21 Coccyx Mid;Other (Comment) Stage 2 -  Partial thickness loss of dermis presenting as a shallow open injury with a red, pink wound bed without slough. (Active)  06/05/21 2200  Location: Coccyx  Location Orientation: Mid;Other (Comment) (coccyx)  Staging: Stage 2 -  Partial thickness loss of dermis presenting as a shallow open injury with a red, pink wound bed without slough.  Wound Description (Comments):   Present on Admission: Yes   Obesity Estimated body mass index is 29.36 kg/m as calculated from the following:   Height as of this encounter: 4\' 5"  (1.346 m).  Weight as of this encounter: 53.2 kg.  Moderate protein calorie malnutrition Nutrition Problem: Moderate Malnutrition Etiology: chronic illness (PVD/PAD)  Signs/Symptoms: moderate fat depletion, severe muscle depletion  Interventions: Ensure Enlive (each supplement provides 350kcal and 20 grams of protein)   DVT Prophylaxis: Being deferred to primary service due to recent significant bleeding.   Medications: Scheduled:  aspirin  81 mg Oral Daily   atorvastatin  40 mg Oral QHS   carvedilol  12.5 mg Oral BID WC   Chlorhexidine Gluconate Cloth  6 each Topical Q0600   clopidogrel  75 mg Oral Daily   dapagliflozin propanediol  10 mg Oral Daily   docusate sodium  100 mg Oral BID   feeding supplement  237 mL Oral BID BM   furosemide  40 mg Intravenous Daily   gabapentin  100 mg Oral TID   insulin aspart  0-15 Units Subcutaneous TID AC & HS   mouth rinse  15 mL Mouth Rinse BID    methocarbamol  750 mg Oral QID   multivitamin with minerals  1 tablet Oral Daily   pantoprazole  40 mg Oral Daily   polyethylene glycol  17 g Oral Daily   sacubitril-valsartan  1 tablet Oral BID   sennosides  10 mL Oral QHS   sodium chloride flush  10-40 mL Intracatheter Q12H   Continuous:  sodium chloride 10 mL/hr at 07/10/21 1900   ceFEPime (MAXIPIME) IV 2 g (07/14/21 0250)   magnesium sulfate bolus IVPB     JYN:WGNFAO chloride, acetaminophen **OR** acetaminophen, alum & mag hydroxide-simeth, hydrALAZINE, magnesium sulfate bolus IVPB, metoprolol tartrate, morphine injection, ondansetron, phenol, sodium chloride flush, traMADol   Objective:  Vital Signs  Vitals:   07/14/21 0425 07/14/21 0430 07/14/21 0715 07/14/21 0804  BP: 126/78 128/80 (!) 129/91 130/79  Pulse:  (!) 108 (!) 112 (!) 114  Resp: 20 15 14    Temp: 98.8 F (37.1 C) 98.7 F (37.1 C) 98.7 F (37.1 C)   TempSrc: Oral Oral Oral   SpO2: 98% 99% 100%   Weight:      Height:        Intake/Output Summary (Last 24 hours) at 07/14/2021 0844 Last data filed at 07/14/2021 0700 Gross per 24 hour  Intake 100 ml  Output 1200 ml  Net -1100 ml    Filed Weights   07/06/21 0300 07/07/21 0630 07/11/21 0452  Weight: 58.2 kg 57.7 kg 53.2 kg     General appearance: Awake alert.  In no distress Resp: Normal effort at rest.  Few crackles at the bases.  No wheezing or rhonchi. Cardio: S1-S2 is mildly tachycardic but regular.  Telemetry shows sinus tachycardia.   GI: Abdomen is soft.  Nontender nondistended.  Bowel sounds are present normal.  No masses organomegaly Extremities: Previous left AKA.  Currently with right BKA with cord stump. Neurologic:  No focal neurological deficits.        Lab Results:  Data Reviewed: I have personally reviewed following labs and imaging studies  CBC: Recent Labs  Lab 07/07/21 1800 07/08/21 0331 07/09/21 0313 07/11/21 0500 07/14/21 0638  WBC 25.2* 16.4* 14.0* 12.0* 10.7*  HGB  9.5* 8.5* 8.4* 8.3* 8.2*  HCT 30.1* 27.0* 26.6* 25.6* 25.8*  MCV 91.8 90.6 89.9 89.5 88.7  PLT 462* 366 354 378 419*     Basic Metabolic Panel: Recent Labs  Lab 07/09/21 0313 07/09/21 1414 07/10/21 0434 07/11/21 0500 07/12/21 0127 07/13/21 0300 07/14/21 0638  NA 135   < > 133* 133*  131* 135 137  K 3.6   < > 3.8 4.2 3.8 3.7 3.6  CL 102   < > 101 99 98 100 104  CO2 26   < > GLUCOSE 163*   < > 98 113* 143* 103* 126*  BUN 19   < > CREATININE 0.65   < > 0.64 0.60* 0.75 0.75 0.76  CALCIUM 8.3*   < > 8.7* 8.8* 8.7* 8.8* 8.8*  MG 1.6*  --  2.0  --   --   --   --   PHOS 2.7  --  3.2  --   --   --   --    < > = values in this interval not displayed.     GFR: Estimated Creatinine Clearance: 48.4 mL/min (by C-G formula based on SCr of 0.76 mg/dL).    Cardiac Enzymes: Recent Labs  Lab 07/08/21 0754  CKTOTAL 253      CBG: Recent Labs  Lab 07/13/21 0609 07/13/21 1142 07/13/21 1750 07/13/21 2117 07/14/21 0639  GLUCAP 104* 170* 159* 137* 131*      Recent Results (from the past 240 hour(s))  Surgical PCR screen     Status: None   Collection Time: 07/07/21  8:11 AM   Specimen: Nasal Mucosa; Nasal Swab  Result Value Ref Range Status   MRSA, PCR NEGATIVE NEGATIVE Final   Staphylococcus aureus NEGATIVE NEGATIVE Final    Comment: (NOTE) The Xpert SA Assay (FDA approved for NASAL specimens in patients 49 years of age and older), is one component of a comprehensive surveillance program. It is not intended to diagnose infection nor to guide or monitor treatment. Performed at [facility], [address] [place], [place] [address]        Radiology Studies: ECHOCARDIOGRAM LIMITED  Result Date: 07/12/2021    ECHOCARDIOGRAM LIMITED REPORT   Patient Name:   Todd Duran Date of Exam: 07/12/2021 Medical Rec #:  [id number]       Height:       53.0 in Accession #:    [id number]      Weight:       117.3 lb Date of Birth:  [birth year]        BSA:          1.360 m Patient Age:    74 years        BP:           121/79 mmHg Patient Gender: M               HR:           97 bpm. Exam Location:  Inpatient Procedure: 2D Echo, Cardiac Doppler and Color Doppler Indications:    CHF  History:        Patient has prior history of Echocardiogram examinations, most                 recent 07/06/2021. Risk Factors:Diabetes and Hypertension.  Sonographer:    Neomia Dear RDCS Referring Phys: [id number] Beatriz Stallion IMPRESSIONS  1. Left ventricular ejection fraction, by estimation, is 35 to 40%. The left ventricle has moderately decreased function. Left ventricular endocardial border not optimally defined to evaluate regional wall motion.  2. Right ventricular systolic function is normal. The right ventricular size is normal. There is normal pulmonary artery systolic pressure. The estimated right ventricular systolic pressure is 32.8 mmHg.  3. The mitral  valve is abnormal. Moderate mitral valve regurgitation.  4. Tricuspid valve regurgitation is mild to moderate.  5. The aortic valve is abnormal. There is moderate calcification of the aortic valve. Aortic valve regurgitation is trivial. Mild aortic valve sclerosis is present, with no evidence of aortic valve stenosis. FINDINGS  Left Ventricle: Left ventricular ejection fraction, by estimation, is 35 to 40%. The left ventricle has moderately decreased function. Left ventricular endocardial border not optimally defined to evaluate regional wall motion.  LV Wall Scoring: The mid and distal lateral wall, mid anterolateral segment, apical septal segment, and basal inferoseptal segment are hypokinetic. Right Ventricle: The right ventricular size is normal. Right ventricular systolic function is normal. There is normal pulmonary artery systolic pressure. The tricuspid regurgitant velocity is 2.73 m/s, and with an assumed right atrial pressure of 3 mmHg,  the estimated right ventricular systolic pressure is 32.8 mmHg. Mitral  Valve: The mitral valve is abnormal. Moderate mitral valve regurgitation. Tricuspid Valve: Tricuspid valve regurgitation is mild to moderate. Aortic Valve: The aortic valve is abnormal. There is moderate calcification of the aortic valve. Aortic valve regurgitation is trivial. Mild aortic valve sclerosis is present, with no evidence of aortic valve stenosis.  LV Volumes (MOD) LV vol d, MOD A2C: 110.0 ml LV vol d, MOD A4C: 117.0 ml LV vol s, MOD A2C: 80.6 ml LV vol s, MOD A4C: 83.6 ml LV SV MOD A2C:     29.4 ml LV SV MOD A4C:     117.0 ml LV SV MOD BP:      29.6 ml TRICUSPID VALVE TR Peak grad:   29.8 mmHg TR Vmax:        273.00 cm/s Weston Brass MD Electronically signed by Weston Brass MD Signature Date/Time: 07/12/2021/3:12:06 PM    Final        LOS: 17 days   Osvaldo Shipper  [facility] Pager on [email or web address]  07/14/2021, 8:44 AM

## 2021-07-14 NOTE — Progress Notes (Signed)
Approximately around 1816, received called from CCMD notified pt had 5 beats of Vtach. Pt asymptomatic. Will continue to monitor the pt.   Lawson Radar, RN

## 2021-07-14 NOTE — Progress Notes (Signed)
Given report to the nurse in short stay. COVID swab performed, CHG wipe completed, CBG rechecked.   Lawson Radar, RN

## 2021-07-15 ENCOUNTER — Encounter (HOSPITAL_COMMUNITY): Payer: Self-pay | Admitting: Vascular Surgery

## 2021-07-15 DIAGNOSIS — J9602 Acute respiratory failure with hypercapnia: Secondary | ICD-10-CM | POA: Diagnosis not present

## 2021-07-15 DIAGNOSIS — R0602 Shortness of breath: Secondary | ICD-10-CM

## 2021-07-15 DIAGNOSIS — Z4659 Encounter for fitting and adjustment of other gastrointestinal appliance and device: Secondary | ICD-10-CM | POA: Diagnosis not present

## 2021-07-15 DIAGNOSIS — D62 Acute posthemorrhagic anemia: Secondary | ICD-10-CM | POA: Diagnosis not present

## 2021-07-15 DIAGNOSIS — Z452 Encounter for adjustment and management of vascular access device: Secondary | ICD-10-CM

## 2021-07-15 DIAGNOSIS — J9601 Acute respiratory failure with hypoxia: Secondary | ICD-10-CM | POA: Diagnosis not present

## 2021-07-15 LAB — CBC
HCT: 20.8 % — ABNORMAL LOW (ref 39.0–52.0)
HCT: 29.3 % — ABNORMAL LOW (ref 39.0–52.0)
Hemoglobin: 6.7 g/dL — CL (ref 13.0–17.0)
Hemoglobin: 9.5 g/dL — ABNORMAL LOW (ref 13.0–17.0)
MCH: 28.2 pg (ref 26.0–34.0)
MCH: 28.4 pg (ref 26.0–34.0)
MCHC: 32.2 g/dL (ref 30.0–36.0)
MCHC: 32.4 g/dL (ref 30.0–36.0)
MCV: 87.4 fL (ref 80.0–100.0)
MCV: 87.7 fL (ref 80.0–100.0)
Platelets: 381 10*3/uL (ref 150–400)
Platelets: 343 10*3/uL (ref 150–400)
RBC: 2.38 MIL/uL — ABNORMAL LOW (ref 4.22–5.81)
RBC: 3.34 MIL/uL — ABNORMAL LOW (ref 4.22–5.81)
RDW: 15.4 % (ref 11.5–15.5)
RDW: 15 % (ref 11.5–15.5)
WBC: 11.8 10*3/uL — ABNORMAL HIGH (ref 4.0–10.5)
WBC: 12.7 10*3/uL — ABNORMAL HIGH (ref 4.0–10.5)
nRBC: 0 % (ref 0.0–0.2)
nRBC: 0 % (ref 0.0–0.2)

## 2021-07-15 LAB — BASIC METABOLIC PANEL
Anion gap: 8 (ref 5–15)
BUN: 22 mg/dL (ref 8–23)
CO2: 24 mmol/L (ref 22–32)
Calcium: 8.9 mg/dL (ref 8.9–10.3)
Chloride: 103 mmol/L (ref 98–111)
Creatinine, Ser: 0.8 mg/dL (ref 0.61–1.24)
GFR, Estimated: >60 mL/min (ref >60)
Glucose, Bld: 88 mg/dL (ref 70–99)
Potassium: 3.7 mmol/L (ref 3.5–5.1)
Sodium: 135 mmol/L (ref 135–145)

## 2021-07-15 LAB — GLUCOSE, CAPILLARY
Glucose-Capillary: 90 mg/dL (ref 70–99)
Glucose-Capillary: 97 mg/dL (ref 70–99)
Glucose-Capillary: 101 mg/dL — ABNORMAL HIGH (ref 70–99)
Glucose-Capillary: 118 mg/dL — ABNORMAL HIGH (ref 70–99)

## 2021-07-15 LAB — PREPARE RBC (CROSSMATCH)

## 2021-07-15 MED ORDER — FUROSEMIDE 10 MG/ML IJ SOLN: 40.0000 mg | Freq: Once | INTRAMUSCULAR | Status: DC

## 2021-07-15 MED ORDER — SODIUM CHLORIDE 0.9% IV SOLUTION: Freq: Once | INTRAVENOUS | Status: AC

## 2021-07-15 MED ORDER — HEPARIN SODIUM (PORCINE) 5000 UNIT/ML IJ SOLN
5000.0000 [IU] | Freq: Three times a day (TID) | INTRAMUSCULAR | Status: DC
  Administered 2021-07-15 – 2021-07-24 (×24): 5000 [IU] via SUBCUTANEOUS
  Filled 2021-07-15 (×28): qty 1

## 2021-07-15 NOTE — Progress Notes (Signed)
Pt resting comfortably on room air. No respiratory distress noted. BIPAP not needed at this time. RT will monitor as needed.

## 2021-07-15 NOTE — Progress Notes (Signed)
Physical Therapy Treatment Patient Details Name: Todd Duran MRN: [id number] DOB: Jan 27, 1946 Today's Date: 07/15/2021    History of Present Illness Pt is a 75 y.o. male admitted with multiple readmissions from CIR for vascular intervention, now readmitted from CIR on 06/27/21 with R groin pseudoaneurysm. S/p exploration, wound vac placed 8/21. Pt with extensive bleeding 8/27 with return to OR emergently x2 s/p external iliac artery ligation and RLE bypass, excision of infected patch in R femoral artery. ETT 8/27-8/28. 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    PT Comments    Pt initially lethargic upon PT and OT arrival to room, becomes more alert throughout session. Pt  requiring max +2 assist for moving to/from EOB, pt with poor safety awareness and sitting on very EOB requiring max +2 assist to boost away from EOB. Pt sat upright with at least min assist x5 minutes, fatigues quickly today. Will continue to follow.   HR 110s-120s during session    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), R BKA to AKA (07/14/2021), confusion Restrictions Weight Bearing Restrictions: Yes RLE Weight Bearing: Non weight bearing LLE Weight Bearing: Non weight bearing    Mobility  Bed Mobility Overal bed mobility: Needs Assistance Bed Mobility: Supine to Sit;Sit to Supine Rolling: Mod assist   Supine to sit: Max assist;+2 for physical assistance;+2 for safety/equipment;HOB elevated Sit to supine: Max assist;+2 for physical assistance;+2 for safety/equipment;HOB elevated   General bed mobility comments: Initially attempted rolling to get to  EOB though pt with increased pain (unspecified location). Overall Max A x 2 for scooting hips towards EOB and lifting trunk to sitting position. pt with R hip close to edge of bed and required Max A x 2 to safely scoot back and return to supine. Pt reaching for unsafe things to stabilize self with (moving objects like unlocked portable computer in room).    Transfers                 General transfer comment: deferred due to confusion, max assist +2 for posterior scooting away from EOB for trunk support and hip boosting.  Ambulation/Gait                 Stairs             Wheelchair Mobility    Modified Rankin (Stroke Patients Only)       Balance Overall balance assessment: Needs assistance Sitting-balance support: Feet unsupported;Bilateral upper extremity supported;No upper extremity supported Sitting balance-Leahy Scale: Poor Sitting balance - Comments: unable to maintain sitting balance without BUE support (bed rail and placed chair in front of pt as pt tending to grab towards BiPAP machine)                                    Cognition Arousal/Alertness: Awake/alert;Lethargic Behavior During Therapy: Flat affect Overall Cognitive Status: Impaired/Different from baseline Area of Impairment: Attention;Memory;Following commands;Safety/judgement;Awareness;Problem solving;Orientation                 Orientation Level: Disoriented to;Situation;Place Current Attention Level: Sustained Memory: Decreased short-term memory;Decreased recall  of precautions Following Commands: Follows one step commands inconsistently;Follows one step commands with increased time Safety/Judgement: Decreased awareness of safety;Decreased awareness of deficits Awareness: Intellectual Problem Solving: Slow processing;Decreased initiation;Difficulty sequencing;Requires verbal cues;Requires tactile cues General Comments: Max difficulty following commands, often says  "wait a minute" to therapists when initiating movement. slow processing and problem solving. decreased safety and awareness of deficits. Once sitting EOB in precarious position, pt asks "you want me to stand up now?" when pt is a bilat AKA      Exercises      General Comments General comments (skin integrity, edema, etc.): HR sustained 118-121bpm, O2 WFL on RA      Pertinent Vitals/Pain Pain Assessment: Faces Faces Pain Scale: Hurts even more Pain Location: unable to verbalize, noted grimacing with rolling to side Pain Descriptors / Indicators: Grimacing;Guarding;Moaning Pain Intervention(s): Limited activity within patient's tolerance;Monitored during session;Repositioned    Home Living                      Prior Function            PT Goals (current goals can now be found in the care plan section) Acute Rehab PT Goals Patient Stated Goal: none stated PT Goal Formulation: With patient Time For Goal Achievement: 07/27/21 Potential to Achieve Goals: Fair Progress towards PT goals: Progressing toward goals    Frequency    Min 3X/week      PT Plan Current plan remains appropriate    Co-evaluation PT/OT/SLP Co-Evaluation/Treatment: Yes Reason for Co-Treatment: For patient/therapist safety;To address functional/ADL transfers;Necessary to address cognition/behavior during functional activity;Complexity of the patient's impairments (multi-system involvement) PT goals addressed during session: Mobility/safety with mobility;Balance OT goals addressed during session: ADL's and self-care;Strengthening/ROM      AM-PAC PT "6 Clicks" Mobility   Outcome Measure  Help needed turning from your back to your side while in a flat bed without using bedrails?: A Lot Help needed moving from lying on your back to sitting on the side of a flat bed without using bedrails?: A Lot Help needed moving to and from a bed to a chair (including a wheelchair)?: Total Help needed standing  up from a chair using your arms (e.g., wheelchair or bedside chair)?: Total Help needed to walk in hospital room?: Total Help needed climbing 3-5 steps with a railing? : Total 6 Click Score: 8    End of Session   Activity Tolerance: Patient limited by fatigue;Patient limited by pain Patient left: in bed;with call bell/phone within reach;with bed alarm set Nurse Communication: Mobility status;Need for lift equipment PT Visit Diagnosis: Other abnormalities of gait and mobility (R26.89);Pain Pain - Right/Left: Right Pain - part of body: Leg     Time: 2229-7989 PT Time Calculation (min) (ACUTE ONLY): 27 min  Charges:  $Neuromuscular Re-education: 8-22 mins           Marye Round, PT DPT Acute Rehabilitation Services Pager [phone]  Office [phone]    Tyrone Apple E Christain [place] 07/15/2021, 4:24 PM

## 2021-07-15 NOTE — Progress Notes (Addendum)
[facility] CONSULT PROGRESS NOTE    Progress Note  Todd Duran  WUJ:[id number] DOB: 1946-05-16 DOA: 06/27/2021 PCP: Pcp, No     Brief Narrative:   Todd Duran is an 75 y.o. male with past medical history of diabetes mellitus type 2, essential hypertension, bilateral BKA , on 05/10/2021 patient received left BKA, came back on 06/05/2021 for right toe amputation right and femoral to popliteal artery bypass grafting which was done on 06/10/2021.  On 06/20/2021 patient presented with an infected right foot and on 06/21/2021 had a right BKA.  On 06/23/2021 was sent to CIR, 06/27/2021 vascular surgery was consulted for for bleeding from the groin site and a dropping hemoglobin to 6.9.  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  On 06/30/2021 revision of the right femoral retroperitoneal and right iliac artery exposure, with a redo of the right femoral artery with endarterectomy and placement of multiple Blodgett sutures for hemostasis.  Postop culture grew Pseudomonas, which was treated with IV cefepime.  On 07/03/2021 he had an acute bleeding from his right groin with wound VAC output greater than 500 mL.  Right groin exploration and evacuation was required of the right groin hematoma.  On 07/04/2021 patient was taken back to the OR for reexploration of the right groin with ligation of the right femoropopliteal vein bypass and ligation of the right external iliac artery, excision of the right bovine pericardial patch and excision of right common femoral artery and negative pressure drain to the right groin.  Admitted to the ICU requiring mechanical ventilation subsequently extubated however he developed fluid overload for which she required BiPAP for few days.    Significant events: 8/27: right groin re-exploration and evacuation of hematoma, very friable  vessels, ultimately opted for ligation.  8/28 more comfortable. No signs of bleeding.  But ended up on BIPAP; 2/2 edema seemed to respond to edema  8/29 adding back home meds. Palliative consulted by primary team.  Echocardiogram: Ejection fraction 30 to 35%, global hypokinesis, LV mildly dilated.  Diastolic parameters consistent with grade 2 diastolic dysfunction.  RV size normal, RV function normal.  Lactate elevated at 4 8/30: Quiet evening.  Blood pressure under control.  Lactate cleared to 0.7 bedside and assessment of lower limb, seemed a little bit warmer on exam.  No distress. Overnight patient got SOB. Pulm edema given lasix and placed on bipap 8/31: patient got sob with pulm edema. Placed on bipap for a few hours and lasix given. 9/6: Plan is for revision of the right BKA    Assessment/Plan:   Acute hypercapnic respiratory failure (HCC): Most likely due to pulmonary edema in the setting of systolic heart failure requiring BiPAP for a few days was aggressively diuresed. He was weaned to room air only using BiPAP as needed.  Acute systolic heart failure:  Appears to be newly diagnosed with an EF of 30% and global hypokinesia. There was a mild elevation in troponins thought to be demand ischemia. Patient was placed on IV diuretics with good response and has been weaned to room air. He remains on aspirin, Plavix, statins, Entresto and Coreg. Cardiology was consulted as he developed sinus tachycardia which they are concerned for MAT.  His Coreg dose was titrated. Free T4 1.2 recommend recheck in 4 to 6 weeks TSH and free T4. Due to new heart failure and EKG changes cardiology was consulted they  recommended to continue conservative management as no invasive procedures planned at this time due to his other acute problems and the patient is having no anginal symptoms.  Essential hypertension/hyponatremia: Blood pressure seems to be reasonably controlled hyponatremia is  resolved.  Peripheral artery disease status post recent right below the knee amputation: Patient is being followed by vascular surgery has had several complications requiring ligation of the right femoral-popliteal artery vein bypass ligation of right external iliac artery and excision of the right bovine pericardial patch with excision of the right common femoral artery. He has been transfused several units of packed red blood cells he remains on aspirin and Plavix. Vascular surgery recommended an above-the-knee amputation with debridement of the right groin and wound VAC placement on 07/14/2021. Vascular surgery plan to take dressing down tomorrow. His hemoglobin this morning is 6.7 we will go ahead and transfuse him 2 units of packed red blood cells.  Pseudomonal infection of the right groin area: He was started on cefepime on 07/02/2021 plan for 4 to 6 weeks of IV empiric antibiotics. Surveillance blood cultures remain negative till date. PICC line placed on 07/05/2021.   Acute blood loss anemia: His hemoglobin this morning 6.7 we will go ahead and transfuse 2 units of packed red blood cells.  Diabetes mellitus type 2: With an A1c of 5.2 in July continue sliding scale insulin.  Goals of care: Seen by palliative care patient is a DNR.  Obesity:  Moderate protein caloric malnutrition: Noted.  Stage II sacral decubitus ulcer RN Pressure Injury Documentation: Pressure Injury 06/05/21 Coccyx Mid;Other (Comment) Stage 2 -  Partial thickness loss of dermis presenting as a shallow open injury with a red, pink wound bed without slough. (Active)  06/05/21 2200  Location: Coccyx  Location Orientation: Mid;Other (Comment)  Staging: Stage 2 -  Partial thickness loss of dermis presenting as a shallow open injury with a red, pink wound bed without slough.  Wound Description (Comments):   Present on Admission: Yes    DVT prophylaxis: PLavix and asa Family Communication:none Status is:  Inpatient  Remains inpatient appropriate because:Hemodynamically unstable  Dispo: The patient is from: Home              Anticipated d/c is to: SNF              Patient currently is not medically stable to d/c.   Difficult to place patient No   Code Status:     Code Status Orders  (From admission, onward)           Start     Ordered   07/07/21 1408  Do not attempt resuscitation (DNR)  Continuous       Question Answer Comment  In the event of cardiac or respiratory ARREST Do not call a "code blue"   In the event of cardiac or respiratory ARREST Do not perform Intubation, CPR, defibrillation or ACLS   In the event of cardiac or respiratory ARREST Use medication by any route, position, wound care, and other measures to relive pain and suffering. May use oxygen, suction and manual treatment of airway obstruction as needed for comfort.      07/07/21 1407           Code Status History     Date Active Date Inactive Code Status Order ID Comments User Context   06/27/2021 2321 07/07/2021 1407 Full Code [id number]  Dory Horn Inpatient   06/23/2021 1737 06/27/2021 1746 Full Code [id number]  Mariam Dollar  Luellen PuckerJ, PA-C Inpatient   06/23/2021 1737 06/23/2021 1737 Full Code [id number]  Lynnae Prudengiulli, Daniel J, PA-C Inpatient   06/05/2021 2201 06/23/2021 1730 Full Code [id number]  Chuck Hintickson, Christopher S, MD Inpatient   06/05/2021 2201 06/05/2021 2201 Full Code [id number]  Purnell ShoemakerMoring, Taylor C, RN Inpatient   05/25/2021 1513 06/05/2021 2013 Full Code [id number]  Charlton Amorngiulli, Daniel J, PA-C Inpatient   05/25/2021 1513 05/25/2021 1513 Full Code [id number]  Charlton Amorngiulli, Daniel J, PA-C Inpatient   05/20/2021 1820 05/25/2021 1505 Full Code [id number]  Dara LordsRhyne, Samantha J, PA-C Inpatient   05/15/2021 1707 05/20/2021 1428 Full Code [id number]  Charlton Amorngiulli, Daniel J, PA-C Inpatient   05/15/2021 1707 05/15/2021 1707 Full Code [id number]  Charlton Amorngiulli, Daniel J, PA-C Inpatient   05/12/2021 1502 05/15/2021 1706 Full Code [id number]  Nada LibmanBrabham, Vance  W, MD Inpatient         IV Access:   Peripheral IV   Procedures and diagnostic studies:   No results found.   Medical Consultants:   None.   Subjective:    Todd Duran no complaints had a good night sleep.  Objective:    Vitals:   07/15/21 0353 07/15/21 0624 07/15/21 0645 07/15/21 0751  BP: 122/77 120/79 125/76 115/74  Pulse: (!) 109 (!) 119 (!) 118 (!) 118  Resp: 20 20 19  (!) 22  Temp: 98.3 F (36.8 C) 98.5 F (36.9 C) 97.6 F (36.4 C)   TempSrc: Oral Oral Oral Oral  SpO2: 98% 98% 100% 100%  Weight:      Height:       SpO2: 100 % O2 Flow Rate (L/min): 2 L/min FiO2 (%): 40 %   Intake/Output Summary (Last 24 hours) at 07/15/2021 0758 Last data filed at 07/15/2021 0500 Gross per 24 hour  Intake 1528.04 ml  Output 1910 ml  Net -381.96 ml   Filed Weights   07/07/21 0630 07/11/21 0452 07/14/21 0923  Weight: 57.7 kg 53.2 kg 56.7 kg    Exam: General exam: In no acute distress. Respiratory system: Good air movement and clear to auscultation. Cardiovascular system: S1 & S2 heard, RRR. No JVD. Gastrointestinal system: Abdomen is nondistended, soft and nontender.  Central nervous system: Alert and oriented. No focal neurological deficits. Extremities: Bilateral below the knee amputation with a right wound VAC in place Skin: No rashes, lesions or ulcers Psychiatry: Judgement and insight appear normal. Mood & affect appropriate.    Data Reviewed:    Labs: Basic Metabolic Panel: Recent Labs  Lab 07/09/21 0313 07/09/21 1414 07/10/21 0434 07/11/21 0500 07/12/21 0127 07/13/21 0300 07/14/21 0638 07/15/21 0400  NA 135   < > 133* 133* 131* 135 137 135  K 3.6   < > 3.8 4.2 3.8 3.7 3.6 3.7  CL 102   < > 101 99 98 100 104 103  CO2 26   < > 26 28 24 25 26 24   GLUCOSE 163*   < > 98 113* 143* 103* 126* 88  BUN 19   < > 18 19 23 21 21 22   CREATININE 0.65   < > 0.64 0.60* 0.75 0.75 0.76 0.80  CALCIUM 8.3*   < > 8.7* 8.8* 8.7* 8.8* 8.8* 8.9  MG 1.6*   --  2.0  --   --   --   --   --   PHOS 2.7  --  3.2  --   --   --   --   --    < > = values in this interval not displayed.  GFR Estimated Creatinine Clearance: 66 mL/min (by C-G formula based on SCr of 0.8 mg/dL). Liver Function Tests: No results for input(s): AST, ALT, ALKPHOS, BILITOT, PROT, ALBUMIN in the last 168 hours. No results for input(s): LIPASE, AMYLASE in the last 168 hours. No results for input(s): AMMONIA in the last 168 hours. Coagulation profile No results for input(s): INR, PROTIME in the last 168 hours. COVID-19 Labs  No results for input(s): DDIMER, FERRITIN, LDH, CRP in the last 72 hours.  Lab Results  Component Value Date   SARSCOV2NAA NEGATIVE 05/12/2021   SARSCOV2NAA NEGATIVE 05/08/2021   SARSCOV2NAA Not Detected 11/29/2019    CBC: Recent Labs  Lab 07/09/21 0313 07/11/21 0500 07/14/21 0638 07/15/21 0400  WBC 14.0* 12.0* 10.7* 11.8*  HGB 8.4* 8.3* 8.2* 6.7*  HCT 26.6* 25.6* 25.8* 20.8*  MCV 89.9 89.5 88.7 87.4  PLT 354 378 419* 381   Cardiac Enzymes: No results for input(s): CKTOTAL, CKMB, CKMBINDEX, TROPONINI in the last 168 hours. BNP (last 3 results) No results for input(s): PROBNP in the last 8760 hours. CBG: Recent Labs  Lab 07/14/21 1155 07/14/21 1244 07/14/21 1633 07/14/21 2118 07/15/21 0636  GLUCAP 142* 132* 107* 141* 90   D-Dimer: No results for input(s): DDIMER in the last 72 hours. Hgb A1c: No results for input(s): HGBA1C in the last 72 hours. Lipid Profile: No results for input(s): CHOL, HDL, LDLCALC, TRIG, CHOLHDL, LDLDIRECT in the last 72 hours. Thyroid function studies: Recent Labs    07/13/21 0300  TSH 2.022   Anemia work up: No results for input(s): VITAMINB12, FOLATE, FERRITIN, TIBC, IRON, RETICCTPCT in the last 72 hours. Sepsis Labs: Recent Labs  Lab 07/09/21 0313 07/11/21 0500 07/14/21 0638 07/15/21 0400  WBC 14.0* 12.0* 10.7* 11.8*   Microbiology Recent Results (from the past 240 hour(s))  Surgical  PCR screen     Status: None   Collection Time: 07/07/21  8:11 AM   Specimen: Nasal Mucosa; Nasal Swab  Result Value Ref Range Status   MRSA, PCR NEGATIVE NEGATIVE Final   Staphylococcus aureus NEGATIVE NEGATIVE Final    Comment: (NOTE) The Xpert SA Assay (FDA approved for NASAL specimens in patients 41 years of age and older), is one component of a comprehensive surveillance program. It is not intended to diagnose infection nor to guide or monitor treatment. Performed at [facility], [address] [place], [place] [address]      Medications:    aspirin  81 mg Oral Daily   atorvastatin  40 mg Oral QHS   carvedilol  12.5 mg Oral BID WC   Chlorhexidine Gluconate Cloth  6 each Topical Q0600   clopidogrel  75 mg Oral Daily   dapagliflozin propanediol  10 mg Oral Daily   docusate sodium  100 mg Oral BID   feeding supplement  237 mL Oral TID BM   furosemide  40 mg Intravenous Daily   gabapentin  100 mg Oral TID   insulin aspart  0-15 Units Subcutaneous TID AC & HS   mouth rinse  15 mL Mouth Rinse BID   methocarbamol  750 mg Oral QID   multivitamin with minerals  1 tablet Oral Daily   pantoprazole  40 mg Oral Daily   polyethylene glycol  17 g Oral Daily   sacubitril-valsartan  1 tablet Oral BID   sennosides  10 mL Oral QHS   sodium chloride flush  10-40 mL Intracatheter Q12H   Continuous Infusions:  sodium chloride 250 mL (07/15/21 0048)  ceFEPime (MAXIPIME) IV 2 g (07/15/21 0051)   magnesium sulfate bolus IVPB        LOS: 18 days   Marinda Elk  [facility]  07/15/2021, 7:58 AM

## 2021-07-15 NOTE — Progress Notes (Signed)
Inpatient Rehab Admissions Coordinator:    Pt. With tachycardia this AM. Not medically ready for CIR at this time. AC team will conitnue to follow for potential readmit pending medical readiness.   Megan Salon, MS, CCC-SLP Rehab Admissions Coordinator  [phone] (celll) [phone] (office)

## 2021-07-15 NOTE — Progress Notes (Signed)
Progress Note  Patient Name: Todd Duran Date of Encounter: 07/15/2021  [facility] Cardiologist: None   Subjective   Postop right AKA.  Breathing comfortably.  Inpatient Medications    Scheduled Meds:  aspirin  81 mg Oral Daily   atorvastatin  40 mg Oral QHS   carvedilol  12.5 mg Oral BID WC   Chlorhexidine Gluconate Cloth  6 each Topical Q0600   clopidogrel  75 mg Oral Daily   dapagliflozin propanediol  10 mg Oral Daily   docusate sodium  100 mg Oral BID   feeding supplement  237 mL Oral TID BM   furosemide  40 mg Intravenous Daily   gabapentin  100 mg Oral TID   insulin aspart  0-15 Units Subcutaneous TID AC & HS   mouth rinse  15 mL Mouth Rinse BID   methocarbamol  750 mg Oral QID   multivitamin with minerals  1 tablet Oral Daily   pantoprazole  40 mg Oral Daily   polyethylene glycol  17 g Oral Daily   sacubitril-valsartan  1 tablet Oral BID   sennosides  10 mL Oral QHS   sodium chloride flush  10-40 mL Intracatheter Q12H   Continuous Infusions:  sodium chloride 250 mL (07/15/21 0048)   ceFEPime (MAXIPIME) IV 2 g (07/15/21 0051)   magnesium sulfate bolus IVPB     PRN Meds: sodium chloride, acetaminophen **OR** acetaminophen, alum & mag hydroxide-simeth, hydrALAZINE, magnesium sulfate bolus IVPB, metoprolol tartrate, morphine injection, ondansetron, phenol, sodium chloride flush, traMADol   Vital Signs    Vitals:   07/15/21 0624 07/15/21 0645 07/15/21 0751 07/15/21 0905  BP: 120/79 125/76 115/74 130/90  Pulse: (!) 119 (!) 118 (!) 118 (!) 118  Resp: 20 19 (!) 22 (!) 21  Temp: 98.5 F (36.9 C) 97.6 F (36.4 C)  98.4 F (36.9 C)  TempSrc: Oral Oral Oral Oral  SpO2: 98% 100% 100% 98%  Weight:      Height:        Intake/Output Summary (Last 24 hours) at 07/15/2021 0957 Last data filed at 07/15/2021 0905 Gross per 24 hour  Intake 1920.04 ml  Output 1910 ml  Net 10.04 ml   Last 3 Weights 07/14/2021 07/11/2021 07/07/2021  Weight (lbs) 125 lb 117 lb 4.6 oz  127 lb 3.3 oz  Weight (kg) 56.7 kg 53.2 kg 57.7 kg      Telemetry    Sinus tach 120's - Personally Reviewed  ECG    Sinus tach 113 - Personally Reviewed  Physical Exam   GEN: No acute distress.   Neck: No JVD Cardiac: Tachy reg, no murmurs, rubs, or gallops.  Respiratory: Clear to auscultation bilaterally. GI: Soft, nontender, non-distended  MS: No edema; bilateral amputation Right AKA Neuro:  Nonfocal  Psych: Normal affect   Labs    High Sensitivity Troponin:   Recent Labs  Lab 07/05/21 2052 07/05/21 2253 07/06/21 0202 07/06/21 0631  TROPONINIHS 126* 192* 265* 265*      Chemistry Recent Labs  Lab 07/13/21 0300 07/14/21 0638 07/15/21 0400  NA 135 137 135  K 3.7 3.6 3.7  CL 100 104 103  CO2 25 26 24   GLUCOSE 103* 126* 88  BUN 21 21 22   CREATININE 0.75 0.76 0.80  CALCIUM 8.8* 8.8* 8.9  GFRNONAA >60 >60 >60  ANIONGAP 10 7 8      Hematology Recent Labs  Lab 07/11/21 0500 07/14/21 0638 07/15/21 0400  WBC 12.0* 10.7* 11.8*  RBC 2.86* 2.91* 2.38*  HGB 8.3* 8.2* 6.7*  HCT 25.6* 25.8* 20.8*  MCV 89.5 88.7 87.4  MCH 29.0 28.2 28.2  MCHC 32.4 31.8 32.2  RDW 15.1 15.1 15.4  PLT 378 419* 381    BNP Recent Labs  Lab 07/09/21 0313  BNP 1,997.1*     DDimer No results for input(s): DDIMER in the last 168 hours.   Radiology    No results found.  Cardiac Studies   Echocardiogram 07/12/2021   1. Left ventricular ejection fraction, by estimation, is 35 to 40%. The  left ventricle has moderately decreased function. Left ventricular  endocardial border not optimally defined to evaluate regional wall motion.   2. Right ventricular systolic function is normal. The right ventricular  size is normal. There is normal pulmonary artery systolic pressure. The  estimated right ventricular systolic pressure is 32.8 mmHg.   3. The mitral valve is abnormal. Moderate mitral valve regurgitation.   4. Tricuspid valve regurgitation is mild to moderate.   5. The  aortic valve is abnormal. There is moderate calcification of the  aortic valve. Aortic valve regurgitation is trivial. Mild aortic valve  sclerosis is present, with no evidence of aortic valve stenosis.   Patient Profile     75 y.o. 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  Assessment & Plan    Acute systolic heart failure - EF 30% newly diagnosed with mild demand ischemia elevated troponin. - IV diuretics administered with good overall response.  Weaned down to room air. - Continuing with Entresto carvedilol.  Avoid SGLT2 inhibitor given pseudomonal groin infection. - No invasive procedures at this time.  Continue with medical management.   Sinus tachycardia - Tachycardia noted on telemetry.  120 range.  Previously compatible with multifocal atrial tachycardia.  This is not atrial fibrillation.  Clear P waves are noted.  Carvedilol increased as directed.  Post AKA.  Anemia hemoglobin 6.7 noted postop.  Vascular team aware.   For questions or updates, please contact [facility] Please consult [email or web address] for contact info under        Signed, Donato Schultz, MD  07/15/2021, 9:57 AM

## 2021-07-15 NOTE — Progress Notes (Signed)
Occupational Therapy Re-evaluation/Treatment Patient Details Name: Todd Duran MRN: [id number] DOB: 1946/02/18 Today's Date: 07/15/2021    History of present illness Pt is a 75 y.o. male admitted with multiple readmissions from CIR for vascular intervention, now readmitted from CIR on 06/27/21 with R groin pseudoaneurysm. S/p exploration, wound vac placed 8/21. Pt with extensive bleeding 8/27 with return to OR emergently x2 s/p external iliac artery ligation and RLE bypass, excision of infected patch in R femoral artery. ETT 8/27-8/28. 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   OT comments  Pt seen for first time since R AKA and noted with hx of confusion s/p procedures. Pt notably limited by confusion and intermittent pain today, requiring Max A x 2 for bed mobility and unable to maintain sitting balance safely EOB. Based on clinical judgement, pt requiring increased assist for UB and LB ADLs at this time. Would recommend bedpan use for toileting tasks until confusion clears for [facility] transfer training. Pending improvements in cognition, will continue to recommend CIR.   HR sustained 118-121bpm with activity   Follow Up Recommendations  CIR    Equipment Recommendations  Tub/shower bench;Wheelchair (measurements OT);Wheelchair cushion (measurements OT)    Recommendations for Other Services Rehab consult    Precautions / Restrictions Precautions Precautions: Fall Precaution Comments: L AKA (05/20/2021), R BKA to AKA (07/14/2021), confusion Restrictions Weight Bearing Restrictions: Yes RLE Weight Bearing: Non weight bearing LLE Weight Bearing: Non weight bearing       Mobility Bed Mobility Overal bed mobility: Needs Assistance Bed Mobility: Supine to Sit;Sit to Supine     Supine to sit: Max assist;+2 for physical assistance;+2 for safety/equipment;HOB elevated Sit to  supine: Max assist;+2 for physical assistance;+2 for safety/equipment;HOB elevated   General bed mobility comments: Initially attempted rolling to get to EOB though pt with increased pain (unspecified location). Overall Max A x 2 for scooting hips towards EOB and lifting trunk to sitting position. pt with R hip close to edge of bed and required Max A x 2 to safely scoot back and return to supine    Transfers                 General transfer comment: deferred due to confusion    Balance Overall balance assessment: Needs assistance Sitting-balance support: Feet unsupported;Bilateral upper extremity supported;No upper extremity supported Sitting balance-Leahy Scale: Poor Sitting balance - Comments: unable to maintain sitting balance without BUE support (bed rail and placed chair in front of pt as pt tending to grab towards BiPAP machine)                                   ADL either performed or assessed with clinical judgement   ADL Overall ADL's : Needs assistance/impaired Eating/Feeding: Minimal assistance;Bed level Eating/Feeding Details (indicate cue type and reason): assist to open containers, prep tea and scoop spaghetti on fork                                   General ADL Comments: Session focused on assessment of EOB abilities s/p BKA to AKA. Pt limited by confusion and intermittent pain with movement - requires extensive assist for LB ADLs due to this, likely unsafe to attempt [facility] transfers  so will be total A with bed pan based on clinical judgement     Vision   Vision Assessment?: No apparent visual deficits   Perception     Praxis      Cognition Arousal/Alertness: Awake/alert;Lethargic Behavior During Therapy: Flat affect Overall Cognitive Status: Impaired/Different from baseline Area of Impairment: Attention;Memory;Following commands;Safety/judgement;Awareness;Problem solving;Orientation                 Orientation Level:  Disoriented to;Situation;Place Current Attention Level: Sustained Memory: Decreased short-term memory;Decreased recall of precautions Following Commands: Follows one step commands inconsistently;Follows one step commands with increased time Safety/Judgement: Decreased awareness of safety;Decreased awareness of deficits Awareness: Intellectual Problem Solving: Slow processing;Decreased initiation;Difficulty sequencing;Requires verbal cues;Requires tactile cues General Comments: A lot of difficulty following commands, often says "wait a minute" to therapists when initiating movement. slow processing and problem solving. decreased safety and awareness of deficits. Once sitting EOB in precarious position, pt asks "you want me to stand up now?"        Exercises     Shoulder Instructions       General Comments HR sustained 118-121bpm, O2 WFL on RA    Pertinent Vitals/ Pain       Pain Assessment: Faces Faces Pain Scale: Hurts even more Pain Location: unable to verbalize, noted grimacing with rolling to side Pain Descriptors / Indicators: Grimacing;Guarding;Moaning Pain Intervention(s): Monitored during session;Limited activity within patient's tolerance  Home Living                                          Prior Functioning/Environment              Frequency  Min 2X/week        Progress Toward Goals  OT Goals(current goals can now be found in the care plan section)  Progress towards OT goals: OT to reassess next treatment  Acute Rehab OT Goals Patient Stated Goal: none stated OT Goal Formulation: With patient Time For Goal Achievement: 07/28/21 Potential to Achieve Goals: Fair ADL Goals Pt Will Perform Grooming: with modified independence;sitting Pt Will Perform Lower Body Bathing: with min guard assist;sitting/lateral leans Pt Will Perform Lower Body Dressing: with min guard assist;sitting/lateral leans Pt Will Transfer to Toilet: with min guard  assist;with transfer board;anterior/posterior transfer;bedside commode Pt Will Perform Toileting - Clothing Manipulation and hygiene: with modified independence;sitting/lateral leans Pt Will Perform Tub/Shower Transfer: Tub transfer;with supervision;tub bench Pt/caregiver will Perform Home Exercise Program: Increased strength;Both right and left upper extremity;Independently;With written HEP provided Additional ADL Goal #1: Pt to verbalize at least 2 fall prevention strategies to maximize safety during ADLs/transfers  Plan Discharge plan remains appropriate;Frequency remains appropriate    Co-evaluation    PT/OT/SLP Co-Evaluation/Treatment: Yes Reason for Co-Treatment: Necessary to address cognition/behavior during functional activity;For patient/therapist safety;To address functional/ADL transfers   OT goals addressed during session: ADL's and self-care;Strengthening/ROM      AM-PAC OT "6 Clicks" Daily Activity     Outcome Measure   Help from another person eating meals?: A Little Help from another person taking care of personal grooming?: A Little Help from another person toileting, which includes using toliet, bedpan, or urinal?: Total Help from another person bathing (including washing, rinsing, drying)?: A Lot Help from another person to put on and taking off regular upper body clothing?: A Little Help from another person to put on and taking off regular lower body clothing?: Total  6 Click Score: 13    End of Session    OT Visit Diagnosis: Other abnormalities of gait and mobility (R26.89);Muscle weakness (generalized) (M62.81);Pain;Other symptoms and signs involving cognitive function Pain - Right/Left: Right Pain - part of body: Leg   Activity Tolerance Other (comment) (limited by confusion)   Patient Left in bed;with call bell/phone within reach;with bed alarm set   Nurse Communication Mobility status        Time: 7846-9629 OT Time Calculation (min): 27  min  Charges: OT General Charges $OT Visit: 1 Visit OT Treatments $Therapeutic Activity: 8-22 mins  Bradd Canary, OTR/L Acute Rehab Services Office: [phone]    Lorre Munroe 07/15/2021, 2:40 PM

## 2021-07-15 NOTE — Progress Notes (Addendum)
Progre ss Note  VASCULAR SURGERY ASSESSMENT & PLAN:   POD 2 RIGHT AKA: I inspected his right AKA today and so far it is healing adequately.  It appears adequately perfused.  POD 2 DEBRIDEMENT RIGHT GROIN WOUND: He will need a VAC change on Monday Wednesdays and Fridays.  NUTRITION: I cannot see that the nutrition team has seen the patient before.  Given his high risk for nonhealing his right AKA and right groin wound I have consulted the nutrition team.  DVT PROPHYLAXIS: He is on subcu heparin.  Cari Caraway, MD 8:11 AM   07/15/2021 7:11 AM 1 Day Post-Op  Subjective:  sleeping, wakes to voice.  No complaints  Afebrile HR 100's-120's  100's-120's systolic 97% RA  Vitals:   07/15/21 0624 07/15/21 0645  BP: 120/79 125/76  Pulse: (!) 119 (!) 118  Resp: 20 19  Temp: 98.5 F (36.9 C) 97.6 F (36.4 C)  SpO2: 98% 100%    Physical Exam: General:  resting comfortably Lungs:  non labored Incisions:  right groin with wound vac in place with good seal.  RLQ incision clean with staples in tact.  Extremities:  right BKA dressing clean and intact.   CBC    Component Value Date/Time   WBC 11.8 (H) 07/15/2021 0400   RBC 2.38 (L) 07/15/2021 0400   HGB 6.7 (LL) 07/15/2021 0400   HCT 20.8 (L) 07/15/2021 0400   PLT 381 07/15/2021 0400   MCV 87.4 07/15/2021 0400   MCH 28.2 07/15/2021 0400   MCHC 32.2 07/15/2021 0400   RDW 15.4 07/15/2021 0400   LYMPHSABS 1.9 06/24/2021 0518   MONOABS 0.6 06/24/2021 0518   EOSABS 0.2 06/24/2021 0518   BASOSABS 0.1 06/24/2021 0518    BMET    Component Value Date/Time   NA 135 07/15/2021 0400   K 3.7 07/15/2021 0400   CL 103 07/15/2021 0400   CO2 24 07/15/2021 0400   GLUCOSE 88 07/15/2021 0400   BUN 22 07/15/2021 0400   CREATININE 0.80 07/15/2021 0400   CALCIUM 8.9 07/15/2021 0400   GFRNONAA >60 07/15/2021 0400    INR    Component Value Date/Time   INR 1.2 06/30/2021 1608     Intake/Output Summary (Last 24 hours) at 07/15/2021  0711 Last data filed at 07/15/2021 0500 Gross per 24 hour  Intake 1528.04 ml  Output 1910 ml  Net -381.96 ml     Assessment/Plan:  75 y.o. male is s/p:  Debridement of right groin with vac placement, right AKA  1 Day Post-Op   -pt resting comfortably this morning.  Right groin vac with good seal.   -right AKA stump dressing intact and clean.  Will take this down tomorrow.  -acute blood loss anemia with hgb 6.7, pt tachycardic-ordered for PRBC's to be given. -DVT prophylaxis:  probably okay to restart sq heparin as this was discontinued for bleeding episodes.  Will d/w Dr. Myra Gianotti.   -discussed with Dr. Myra Gianotti and ok to start sq heparin for DVT prophylaxis.   Doreatha Massed, PA-C [facility] [phone] 07/15/2021 7:11 AM  I have interviewed the patient and examined the patient. I agree with the findings by the PA.  Dressing change tomorrow.  I think his first VAC change can be Friday and then he will be on Monday Wednesday Friday VAC change.  If nutrition has not been following this patient I think that we should consult nutrition tomorrow as this will be critical for healing of his AKA and groin  wound.  Cari Caraway, MD

## 2021-07-15 NOTE — Progress Notes (Signed)
Date and time results received: 07/15/21 at 04:44 am.  Test: CBC  Critical Value: Hb 6.7  Name of Provider Notified: Dr. Myra Gianotti  Orders Received for 2 unit of PRBC transfusion. Redo type and screen. Awaiting for PRBC from blood bank to be ready.  Filiberto Pinks, RN

## 2021-07-16 DIAGNOSIS — R0600 Dyspnea, unspecified: Secondary | ICD-10-CM

## 2021-07-16 DIAGNOSIS — R0602 Shortness of breath: Secondary | ICD-10-CM | POA: Diagnosis not present

## 2021-07-16 LAB — BASIC METABOLIC PANEL
Anion gap: 6 (ref 5–15)
BUN: 19 mg/dL (ref 8–23)
CO2: 22 mmol/L (ref 22–32)
Calcium: 7.3 mg/dL — ABNORMAL LOW (ref 8.9–10.3)
Chloride: 108 mmol/L (ref 98–111)
Creatinine, Ser: 0.69 mg/dL (ref 0.61–1.24)
GFR, Estimated: >60 mL/min (ref >60)
Glucose, Bld: 135 mg/dL — ABNORMAL HIGH (ref 70–99)
Potassium: 2.9 mmol/L — ABNORMAL LOW (ref 3.5–5.1)
Sodium: 136 mmol/L (ref 135–145)

## 2021-07-16 LAB — CBC
HCT: 29.1 % — ABNORMAL LOW (ref 39.0–52.0)
Hemoglobin: 9.5 g/dL — ABNORMAL LOW (ref 13.0–17.0)
MCH: 28.4 pg (ref 26.0–34.0)
MCHC: 32.6 g/dL (ref 30.0–36.0)
MCV: 86.9 fL (ref 80.0–100.0)
Platelets: 361 10*3/uL (ref 150–400)
RBC: 3.35 MIL/uL — ABNORMAL LOW (ref 4.22–5.81)
RDW: 15.2 % (ref 11.5–15.5)
WBC: 11.4 10*3/uL — ABNORMAL HIGH (ref 4.0–10.5)
nRBC: 0 % (ref 0.0–0.2)

## 2021-07-16 LAB — TYPE AND SCREEN
ABO/RH(D): O POS
Antibody Screen: NEGATIVE
Unit division: 0
Unit division: 0

## 2021-07-16 LAB — GLUCOSE, CAPILLARY
Glucose-Capillary: 101 mg/dL — ABNORMAL HIGH (ref 70–99)
Glucose-Capillary: 130 mg/dL — ABNORMAL HIGH (ref 70–99)
Glucose-Capillary: 133 mg/dL — ABNORMAL HIGH (ref 70–99)
Glucose-Capillary: 50 mg/dL — ABNORMAL LOW (ref 70–99)
Glucose-Capillary: 64 mg/dL — ABNORMAL LOW (ref 70–99)
Glucose-Capillary: 88 mg/dL (ref 70–99)

## 2021-07-16 LAB — BPAM RBC
Blood Product Expiration Date: 202210062359
Blood Product Expiration Date: 202210062359
ISSUE DATE / TIME: 202209070616
ISSUE DATE / TIME: 202209071125
Unit Type and Rh: 5100
Unit Type and Rh: 5100

## 2021-07-16 LAB — SURGICAL PATHOLOGY

## 2021-07-16 MED ORDER — PROSOURCE TF PO LIQD
45.0000 mL | Freq: Three times a day (TID) | ORAL | Status: DC
  Administered 2021-07-17 – 2021-07-25 (×24): 45 mL
  Filled 2021-07-16 (×25): qty 45

## 2021-07-16 MED ORDER — CARVEDILOL 12.5 MG PO TABS
12.5000 mg | ORAL_TABLET | Freq: Once | ORAL | Status: AC
  Administered 2021-07-16: 12.5 mg via ORAL
  Filled 2021-07-16: qty 1

## 2021-07-16 MED ORDER — OSMOLITE 1.5 CAL PO LIQD
1000.0000 mL | ORAL | Status: DC
  Administered 2021-07-17 – 2021-07-23 (×6): 1000 mL
  Filled 2021-07-16 (×11): qty 1000

## 2021-07-16 MED ORDER — POTASSIUM CHLORIDE CRYS ER 20 MEQ PO TBCR
40.0000 meq | EXTENDED_RELEASE_TABLET | Freq: Two times a day (BID) | ORAL | Status: AC
  Administered 2021-07-16 (×2): 40 meq via ORAL
  Filled 2021-07-16 (×2): qty 2

## 2021-07-16 MED ORDER — CARVEDILOL 25 MG PO TABS
25.0000 mg | ORAL_TABLET | Freq: Two times a day (BID) | ORAL | Status: DC
  Administered 2021-07-16 – 2021-07-17 (×3): 25 mg via ORAL
  Filled 2021-07-16 (×3): qty 1

## 2021-07-16 NOTE — Progress Notes (Signed)
Nutrition Follow-up  DOCUMENTATION CODES:   Non-severe (moderate) malnutrition in context of chronic illness  INTERVENTION:   Once Cortrak tube placed on 07/17/21 and placement is confirmed, initiate enteral nutrition: - Start Osmolite 1.5 @ 20 ml/hr and advance by 10 ml q 8 hours to goal rate of 50 ml/hr (1200 ml/day) - ProSource TF 45 ml TID  Tube feeding regimen at goal provides 1920 kcal, 108 grams of protein, and 914 ml of H2O.  Once enteral nutrition initiated, monitor magnesium, potassium, and phosphorus twice daily for at least 3 days, MD to replete as needed, as pt is at risk for refeeding syndrome given malnutrition, inadequate PO intake.  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  Ongoing, being addressed via diet liberalization, oral nutrition supplements and initiation of enteral nutrition on 07/17/21  GOAL:   Patient will meet greater than or equal to 90% of their needs  Unmet at this time, will progress with initiation of enteral nutrition on 07/921  MONITOR:   PO intake, Supplement acceptance, Labs, Weight trends, TF tolerance, Skin, I & O's  REASON FOR ASSESSMENT:   Consult Assessment of nutrition requirement/status  ASSESSMENT:   Patient with PMH significant for PAD, L BKA, PVD, DM, and HTN. Presents this admission with large pseudoaneurysm of the R groin.  8/14 - s/p R BKA 8/20 - s/p retroperitoneal exposure of R iliac artery, exploration of R groin, repair of bovine pericardial patch angioplasty 8/23 - s/p redo R femoral artery exposure, revision of R femoral endarterectomy, R femoral wound VAC 8/27 - s/p R groin exploration with common femoral repair, evacuation of R groin hematoma,  reexploration later in the day 8/28 - extubated  9/06 - s/p R AKA, wound VAC on R thigh, groin debridement  Noted therapies recommending CIR at d/c.  Spoke with pt at bedside. Pt confused and reports that he hasn't eaten today due to "running into a lot of traffic." Pt unable to tell RD whether he ate anything yesterday and states that he does not know what Ensure supplements are despite these being ordered for him since 9/06.  Discussed pt with RN. Per RN, pt ate only 1 meal yesterday (07/15/21) which was lunch. Pt did not finish his lunch. RN reports pt did not eat breakfast today. RN also states that pt has difficulty consuming Ensure oral nutrition supplements. He is unable to hold them steady in his hand and ends up spilling them on the bed. RN reports that she has tried to hold the supplement for him and prompt him to drink it, but pt states "maybe later" or "give me a minute" and does not end up consuming the supplement.  Discussed the above information with Vascular Surgery MD. Due to poor PO intake and malnutrition at baseline, recommend placement of Cortrak NG tube and initiation of enteral nutrition as pt's wounds are unlikely to heal with current inadequate nutrient intake. Vascular Surgery MD in agreement and plan is for Cortrak placement tomorrow, 07/17/21, and initiation of enteral nutrition at that time. RN is aware of plan.  Admit weight: 57.5 kg Current weight: 56.7 kg  Pt with non-pitting edema to RLE.  Meal Completion: 50-100% x last 6 documented meals (no meal completions documented since 07/12/21)  Medications reviewed and include: farxiga, colace, Ensure Enlive TID, IV lasix, SSI, MVI with minerals, miralax, senokot, IV abx  Labs reviewed: hemoglobin 9.5 CBG's: 97-118 x 24 hours  UOP: 1650 ml x 24 hours Wound VAC: 0 ml x 24 hours I/O's: +3.7 L since admit  Diet Order:   Diet Order             Diet regular Room service appropriate? No; Fluid consistency: Thin  Diet  effective now                   EDUCATION NEEDS:   Not appropriate for education at this time  Skin:  Skin Assessment: Skin Integrity Issues: Stage II: coccyx Wound VAC: R thigh, R groin Incisions: R leg  Last BM:  07/13/21 large type 5  Height:   Ht Readings from Last 1 Encounters:  07/14/21 5\' 11"  (1.803 m)    Weight:   Wt Readings from Last 1 Encounters:  07/14/21 56.7 kg    BMI:  Body mass index is 17.43 kg/m.  Estimated Nutritional Needs:   Kcal:  1800-2000 kcal  Protein:  100-120 grams  Fluid:  >/= 1.8 L/day    09/13/21, MS, RD, LDN Inpatient Clinical Dietitian Please see [facility] for contact information.

## 2021-07-16 NOTE — Progress Notes (Signed)
Physical Therapy Treatment Patient Details Name: Todd Duran MRN: [id number] DOB: [birth year] Today's Date: 07/16/2021    History of Present Illness Pt is a 75 y.o. male admitted with multiple readmissions from CIR for vascular intervention, now readmitted from CIR on 06/27/21 with R groin pseudoaneurysm. S/p exploration, wound vac placed 8/21. Pt with extensive bleeding 8/27 with return to OR emergently x2 s/p external iliac artery ligation and RLE bypass, excision of infected patch in R femoral artery. ETT 8/27-8/28. 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    PT Comments    The pt was seen for continued attempts to progress transfers and seated balance, however, at this time the pt was resistant to attempts to complete bed mobility due to pain, slow processing, and poor safety awareness. The pt followed <10% of commands this session despite max multimodal cues and attempts to facilitate and initiate movements. The pt required totalA of 2 and use of HOB elevation to transition to sitting position, was unable to progress to sitting EOB or long-sitting without support at this time. If cognitive deficits persist, pt may not be appropriate for CIR therapies as he would not be able to tolerate at this time. Will continue to assess with further mobility/treatments.    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), R BKA to AKA (07/14/2021), confusion Restrictions Weight Bearing Restrictions: Yes RLE Weight Bearing: Non weight bearing LLE Weight Bearing: Non weight bearing    Mobility  Bed Mobility Overal bed mobility: Needs Assistance Bed  Mobility: Rolling;Supine to Sit Rolling: Max assist   Supine to sit: Total assist;+2 for physical assistance;HOB elevated Sit to supine: Total assist;+2 for physical assistance;HOB elevated   General bed mobility comments: pt resistant to rolling despite attempts to either side, then needing totalA of 2 with elevation of HOB to complete any transition to sitting, pt resistant to attempts to sit unsupported    Transfers                 General transfer comment: deferred due to decreased command following, pt resisting all movement      Balance Overall balance assessment: Needs assistance   Sitting balance-Leahy Scale: Zero Sitting balance - Comments: pt resisting transition to unsupported sitting                                    Cognition Arousal/Alertness: Awake/alert;Lethargic Behavior During Therapy: Flat affect Overall Cognitive Status: Impaired/Different from baseline Area of Impairment: Attention;Memory;Following commands;Safety/judgement;Awareness;Problem solving;Orientation                 Orientation Level: Disoriented to;Situation;Place Current Attention Level: Focused Memory: Decreased short-term memory;Decreased recall of precautions Following Commands: Follows one step commands inconsistently;Follows one step commands with increased time (<10% on first command) Safety/Judgement: Decreased awareness of safety;Decreased awareness of deficits Awareness: Intellectual Problem Solving: Slow processing;Decreased initiation;Difficulty sequencing;Requires verbal cues;Requires tactile cues General Comments: significant difficulty with following commands, following < 10% on first attempt but following ~50% with max multimodal cues and initiation of movement from therapists. Pt stating "wait a minute" with any attempt to facilitate  movement, slow processing and safety awareness. overall resistant to therapist attempt to mobilize or complete any  changes in position      Exercises Other Exercises Other Exercises: supine cross-body reaches to incorporate trunk fexion and increase core activation to initiate rolling. max cues x2 each side    General Comments General comments (skin integrity, edema, etc.): VSS, pt remains with cog deficits and was not redirectable today      Pertinent Vitals/Pain Pain Assessment: Faces Faces Pain Scale: Hurts even more Pain Location: R hip, espcially with movement of RLE Pain Descriptors / Indicators: Grimacing;Guarding;Moaning Pain Intervention(s): Limited activity within patient's tolerance;Monitored during session;Repositioned     PT Goals (current goals can now be found in the care plan section) Acute Rehab PT Goals Patient Stated Goal: none stated PT Goal Formulation: With patient Time For Goal Achievement: 07/27/21 Potential to Achieve Goals: Fair Progress towards PT goals: Not progressing toward goals - comment (pt more resistant to activity today)    Frequency    Min 3X/week      PT Plan Current plan remains appropriate       AM-PAC PT "6 Clicks" Mobility   Outcome Measure  Help needed turning from your back to your side while in a flat bed without using bedrails?: Total Help needed moving from lying on your back to sitting on the side of a flat bed without using bedrails?: Total Help needed moving to and from a bed to a chair (including a wheelchair)?: Total Help needed standing up from a chair using your arms (e.g., wheelchair or bedside chair)?: Total Help needed to walk in hospital room?: Total Help needed climbing 3-5 steps with a railing? : Total 6 Click Score: 6    End of Session Equipment Utilized During Treatment: Gait belt Activity Tolerance: Patient limited by fatigue;Patient limited by pain Patient left: in bed;with call bell/phone within reach;with bed alarm set Nurse Communication: Mobility status;Need for lift equipment PT Visit Diagnosis: Other  abnormalities of gait and mobility (R26.89);Pain Pain - Right/Left: Right Pain - part of body: Leg     Time: 2440-1027 PT Time Calculation (min) (ACUTE ONLY): 25 min  Charges:  $Therapeutic Activity: 23-37 mins                     Todd Duran, PT, DPT   Acute Rehabilitation Department Pager #: [phone]    Todd Duran 07/16/2021, 2:45 PM

## 2021-07-16 NOTE — Progress Notes (Addendum)
  Progress Note    07/16/2021 7:39 AM 2 Days Post-Op  Subjective:  pain R AKA.  No other complaints   Vitals:   07/15/21 2358 07/16/21 0432  BP: 118/75 124/81  Pulse: (!) 110 (!) 112  Resp: 16 17  Temp: 98.1 F (36.7 C) 97.9 F (36.6 C)  SpO2: 100% 98%   Physical Exam: Lungs:  non labored Incisions:  R groin with vac in place; L AKA healed; R AKA warm Neurologic: A&O  CBC    Component Value Date/Time   WBC 11.4 (H) 07/16/2021 0356   RBC 3.35 (L) 07/16/2021 0356   HGB 9.5 (L) 07/16/2021 0356   HCT 29.1 (L) 07/16/2021 0356   PLT 361 07/16/2021 0356   MCV 86.9 07/16/2021 0356   MCH 28.4 07/16/2021 0356   MCHC 32.6 07/16/2021 0356   RDW 15.2 07/16/2021 0356   LYMPHSABS 1.9 06/24/2021 0518   MONOABS 0.6 06/24/2021 0518   EOSABS 0.2 06/24/2021 0518   BASOSABS 0.1 06/24/2021 0518    BMET    Component Value Date/Time   NA 135 07/15/2021 0400   K 3.7 07/15/2021 0400   CL 103 07/15/2021 0400   CO2 24 07/15/2021 0400   GLUCOSE 88 07/15/2021 0400   BUN 22 07/15/2021 0400   CREATININE 0.80 07/15/2021 0400   CALCIUM 8.9 07/15/2021 0400   GFRNONAA >60 07/15/2021 0400    INR    Component Value Date/Time   INR 1.2 06/30/2021 1608     Intake/Output Summary (Last 24 hours) at 07/16/2021 0739 Last data filed at 07/16/2021 0543 Gross per 24 hour  Intake 1408 ml  Output 1650 ml  Net -242 ml     Assessment/Plan:  75 y.o. male is s/p R AKA 2 Days Post-Op   R AKA warm without signs of ischemia Encouraged nutrition PT/OT recommending return to CIR; ok for d/c when bed approved    Emilie Rutter, PA-C [facility] [phone] 07/16/2021 7:39 AM  I agree with the above.  Stump looks good.  Still with pain issues.  Will ask rehab to eval .  Groin change Friday  [facility]

## 2021-07-16 NOTE — Progress Notes (Signed)
Progress Note  Patient Name: Todd Duran Date of Encounter: 07/16/2021  [facility] Cardiologist: None   Subjective   Fairly comfortable in bed.  Tachycardia noted.  Inpatient Medications    Scheduled Meds:  aspirin  81 mg Oral Daily   atorvastatin  40 mg Oral QHS   carvedilol  25 mg Oral BID WC   Chlorhexidine Gluconate Cloth  6 each Topical Q0600   clopidogrel  75 mg Oral Daily   dapagliflozin propanediol  10 mg Oral Daily   docusate sodium  100 mg Oral BID   feeding supplement  237 mL Oral TID BM   furosemide  40 mg Intravenous Daily   gabapentin  100 mg Oral TID   heparin injection (subcutaneous)  5,000 Units Subcutaneous Q8H   insulin aspart  0-15 Units Subcutaneous TID AC & HS   mouth rinse  15 mL Mouth Rinse BID   methocarbamol  750 mg Oral QID   multivitamin with minerals  1 tablet Oral Daily   pantoprazole  40 mg Oral Daily   polyethylene glycol  17 g Oral Daily   sacubitril-valsartan  1 tablet Oral BID   sennosides  10 mL Oral QHS   sodium chloride flush  10-40 mL Intracatheter Q12H   Continuous Infusions:  sodium chloride 250 mL (07/15/21 0048)   ceFEPime (MAXIPIME) IV 2 g (07/16/21 0046)   magnesium sulfate bolus IVPB     PRN Meds: sodium chloride, acetaminophen **OR** acetaminophen, alum & mag hydroxide-simeth, hydrALAZINE, magnesium sulfate bolus IVPB, metoprolol tartrate, morphine injection, ondansetron, phenol, sodium chloride flush, traMADol   Vital Signs    Vitals:   07/16/21 0753 07/16/21 1038 07/16/21 1115 07/16/21 1122  BP: 129/84 117/79 106/70 104/70  Pulse: (!) 114 (!) 118  (!) 103  Resp: (!) 21 17 19 19   Temp: 98.4 F (36.9 C)   97.9 F (36.6 C)  TempSrc: Axillary   Oral  SpO2: 99%  98% 99%  Weight:      Height:        Intake/Output Summary (Last 24 hours) at 07/16/2021 1138 Last data filed at 07/16/2021 1100 Gross per 24 hour  Intake 1146 ml  Output 1750 ml  Net -604 ml   Last 3 Weights 07/14/2021 07/11/2021 07/07/2021   Weight (lbs) 125 lb 117 lb 4.6 oz 127 lb 3.3 oz  Weight (kg) 56.7 kg 53.2 kg 57.7 kg      Telemetry    Sinus tach 120's-improved to 98- Personally Reviewed  ECG    Sinus tach 113 - Personally Reviewed  Physical Exam   GEN: No acute distress.   Neck: No JVD Cardiac: Regular rate and rhythm, no murmurs, rubs, or gallops.  Respiratory: Clear to auscultation bilaterally. GI: Soft, nontender, non-distended  MS: No edema; bilateral amputation right AKA Neuro:  Nonfocal  Psych: Normal affect   Labs    High Sensitivity Troponin:   Recent Labs  Lab 07/05/21 2052 07/05/21 2253 07/06/21 0202 07/06/21 0631  TROPONINIHS 126* 192* 265* 265*      Chemistry Recent Labs  Lab 07/13/21 0300 07/14/21 0638 07/15/21 0400  NA 135 137 135  K 3.7 3.6 3.7  CL 100 104 103  CO2 25 26 24   GLUCOSE 103* 126* 88  BUN 21 21 22   CREATININE 0.75 0.76 0.80  CALCIUM 8.8* 8.8* 8.9  GFRNONAA >60 >60 >60  ANIONGAP 10 7 8      Hematology Recent Labs  Lab 07/15/21 0400 07/15/21 1800 07/16/21 0356  WBC 11.8* 12.7* 11.4*  RBC 2.38* 3.34* 3.35*  HGB 6.7* 9.5* 9.5*  HCT 20.8* 29.3* 29.1*  MCV 87.4 87.7 86.9  MCH 28.2 28.4 28.4  MCHC 32.2 32.4 32.6  RDW 15.4 15.0 15.2  PLT 381 343 361    BNP No results for input(s): BNP, PROBNP in the last 168 hours.    DDimer No results for input(s): DDIMER in the last 168 hours.   Radiology    No results found.  Cardiac Studies   Echocardiogram 07/12/2021   1. Left ventricular ejection fraction, by estimation, is 35 to 40%. The  left ventricle has moderately decreased function. Left ventricular  endocardial border not optimally defined to evaluate regional wall motion.   2. Right ventricular systolic function is normal. The right ventricular  size is normal. There is normal pulmonary artery systolic pressure. The  estimated right ventricular systolic pressure is 32.8 mmHg.   3. The mitral valve is abnormal. Moderate mitral valve  regurgitation.   4. Tricuspid valve regurgitation is mild to moderate.   5. The aortic valve is abnormal. There is moderate calcification of the  aortic valve. Aortic valve regurgitation is trivial. Mild aortic valve  sclerosis is present, with no evidence of aortic valve stenosis.   Patient Profile     75 y.o. 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  Assessment & Plan    Acute systolic heart failure - EF 30% newly diagnosed with mild demand ischemia elevated troponin. - IV diuretics administered with good overall response.  Weaned down to room air. - Continuing with Entresto carvedilol.  Avoid SGLT2 inhibitor given pseudomonal groin infection. - No invasive procedures at this time.  Continue with medical management.   Sinus tachycardia - Tachycardia noted on telemetry.  120 range.  Previously compatible with multifocal atrial tachycardia.  This is not atrial fibrillation.  Clear P waves are noted.  Carvedilol increased as directed.  Agree with increasing to 25 twice daily today.  Currently telemetry shows heart rate of 98.  Excellent.  Post AKA.  Anemia hemoglobin 6.7 noted postop.  Now 9.5.     For questions or updates, please contact [facility] Please consult [email or web address] for contact info under        Signed, Donato Schultz, MD  07/16/2021, 11:38 AM

## 2021-07-16 NOTE — Progress Notes (Signed)
[facility] CONSULT PROGRESS NOTE    Progress Note  Todd Duran  HEN:[id number] DOB: 14-Nov-1945 DOA: 06/27/2021 PCP: Pcp, No     Brief Narrative:   Todd Duran is an 75 y.o. male with past medical history of diabetes mellitus type 2, essential hypertension, bilateral BKA , on 05/10/2021 patient received left BKA, came back on 06/05/2021 for right toe amputation right and femoral to popliteal artery bypass grafting which was done on 06/10/2021.  On 06/20/2021 patient presented with an infected right foot and on 06/21/2021 had a right BKA.  On 06/23/2021 was sent to CIR, 06/27/2021 vascular surgery was consulted for for bleeding from the groin site and a dropping hemoglobin to 6.9.  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  On 06/30/2021 revision of the right femoral retroperitoneal and right iliac artery exposure, with a redo of the right femoral artery with endarterectomy and placement of multiple Blodgett sutures for hemostasis.  Postop culture grew Pseudomonas, which was treated with IV cefepime.  On 07/03/2021 he had an acute bleeding from his right groin with wound VAC output greater than 500 mL.  Right groin exploration and evacuation was required of the right groin hematoma.  On 07/04/2021 patient was taken back to the OR for reexploration of the right groin with ligation of the right femoropopliteal vein bypass and ligation of the right external iliac artery, excision of the right bovine pericardial patch and excision of right common femoral artery and negative pressure drain to the right groin.  Admitted to the ICU requiring mechanical ventilation subsequently extubated however he developed fluid overload for which she required BiPAP for few days.    Significant events: 8/27: right groin re-exploration and evacuation of hematoma, very friable  vessels, ultimately opted for ligation.  8/28 more comfortable. No signs of bleeding.  But ended up on BIPAP; 2/2 edema seemed to respond to edema  8/29 adding back home meds. Palliative consulted by primary team.  Echocardiogram: Ejection fraction 30 to 35%, global hypokinesis, LV mildly dilated.  Diastolic parameters consistent with grade 2 diastolic dysfunction.  RV size normal, RV function normal.  Lactate elevated at 4 8/30: Quiet evening.  Blood pressure under control.  Lactate cleared to 0.7 bedside and assessment of lower limb, seemed a little bit warmer on exam.  No distress. Overnight patient got SOB. Pulm edema given lasix and placed on bipap 8/31: patient got sob with pulm edema. Placed on bipap for a few hours and lasix given. 9/6: Plan is for revision of the right BKA    Assessment/Plan:   Acute hypercapnic respiratory failure (HCC): Most likely due to pulmonary edema in the setting of systolic heart failure requiring BiPAP for a few days was aggressively diuresed. He was weaned to room air only using BiPAP as needed.  Peripheral artery disease status post recent right below the knee amputation: Patient is being followed by vascular surgery has had several complications requiring ligation of the right femoral-popliteal artery vein bypass ligation of right external iliac artery and excision of the right bovine pericardial patch with excision of the right common femoral artery. He has been transfused several units of packed red blood cells he remains on aspirin and Plavix. Vascular surgery recommended an above-the-knee amputation with debridement of the right groin and wound VAC placement on 07/14/2021. Vascular surgery plan to take dressing down tomorrow. His hemoglobin this morning is  6.7 we will go ahead and transfuse him 2 units of packed red blood cells.  Acute systolic heart failure:  Appears to be newly diagnosed with an EF of 30% and global hypokinesia. There was a mild  elevation in troponins thought to be demand ischemia. Patient was placed on IV diuretics with good response and has been weaned to room air. He remains on aspirin, Plavix, statins, Entresto and Coreg. Free T4 1.2 recommend recheck in 4 to 6 weeks TSH and free T4. Due to new heart failure and EKG changes cardiology was consulted they recommended to continue conservative management as no invasive procedures planned at this time due to his other acute problems and the patient is having no anginal symptoms.  Persistence MAT: Cardiology on board recommended to increase Coreg.  Essential hypertension/hyponatremia: Blood pressure seems to be reasonably controlled hyponatremia is resolved.  Pseudomonal infection of the right groin area: He was started on cefepime on 07/02/2021 plan for 4 to 6 weeks of IV empiric antibiotics. Surveillance blood cultures remain negative till date. PICC line placed on 07/05/2021.  Acute blood loss anemia: His hemoglobin this morning 6.7 status post 2 units of packed red Blood cells his hemoglobin this morning is 9.5.  Continue to monitor intermittently.  Diabetes mellitus type 2: With an A1c of 5.2 in July continue sliding scale insulin.  Goals of care: Seen by palliative care patient is a DNR.  Obesity:  Moderate protein caloric malnutrition: Noted.  Stage II sacral decubitus ulcer RN Pressure Injury Documentation: Pressure Injury 06/05/21 Coccyx Mid;Other (Comment) Stage 2 -  Partial thickness loss of dermis presenting as a shallow open injury with a red, pink wound bed without slough. (Active)  06/05/21 2200  Location: Coccyx  Location Orientation: Mid;Other (Comment)  Staging: Stage 2 -  Partial thickness loss of dermis presenting as a shallow open injury with a red, pink wound bed without slough.  Wound Description (Comments):   Present on Admission: Yes    DVT prophylaxis: PLavix and asa Family Communication:none Status is: Inpatient  Remains  inpatient appropriate because:Hemodynamically unstable  Dispo: The patient is from: Home              Anticipated d/c is to: SNF              Patient currently is not medically stable to d/c.   Difficult to place patient No   Code Status:     Code Status Orders  (From admission, onward)           Start     Ordered   07/07/21 1408  Do not attempt resuscitation (DNR)  Continuous       Question Answer Comment  In the event of cardiac or respiratory ARREST Do not call a "code blue"   In the event of cardiac or respiratory ARREST Do not perform Intubation, CPR, defibrillation or ACLS   In the event of cardiac or respiratory ARREST Use medication by any route, position, wound care, and other measures to relive pain and suffering. May use oxygen, suction and manual treatment of airway obstruction as needed for comfort.      07/07/21 1407           Code Status History     Date Active Date Inactive Code Status Order ID Comments User Context   06/27/2021 2321 07/07/2021 1407 Full Code [id number]  Dory Horn Inpatient   06/23/2021 1737 06/27/2021 1746 Full Code [id number]  Angiulli, Mcarthur Rossetti, PA-C Inpatient  06/23/2021 1737 06/23/2021 1737 Full Code [id number]  Lynnae Prude Inpatient   06/05/2021 2201 06/23/2021 1730 Full Code [id number]  Chuck Hint, MD Inpatient   06/05/2021 2201 06/05/2021 2201 Full Code [id number]  Purnell Shoemaker, RN Inpatient   05/25/2021 1513 06/05/2021 2013 Full Code [id number]  Charlton Amor, PA-C Inpatient   05/25/2021 1513 05/25/2021 1513 Full Code [id number]  Charlton Amor, PA-C Inpatient   05/20/2021 1820 05/25/2021 1505 Full Code [id number]  Dara Lords, PA-C Inpatient   05/15/2021 1707 05/20/2021 1428 Full Code [id number]  Charlton Amor, PA-C Inpatient   05/15/2021 1707 05/15/2021 1707 Full Code [id number]  Charlton Amor, PA-C Inpatient   05/12/2021 1502 05/15/2021 1706 Full Code [id number]  Nada Libman, MD Inpatient          IV Access:   Peripheral IV   Procedures and diagnostic studies:   No results found.   Medical Consultants:   None.   Subjective:    Todd Duran no complaints had a good night sleep.  Objective:    Vitals:   07/15/21 2016 07/15/21 2358 07/16/21 0432 07/16/21 0753  BP: 137/77 118/75 124/81 129/84  Pulse: (!) 113 (!) 110 (!) 112 (!) 117  Resp: 14 16 17    Temp: 99.7 F (37.6 C) 98.1 F (36.7 C) 97.9 F (36.6 C) 98.4 F (36.9 C)  TempSrc: Oral Axillary Oral Axillary  SpO2: 100% 100% 98% 99%  Weight:      Height:       SpO2: 99 % O2 Flow Rate (L/min): 2 L/min FiO2 (%): 40 %   Intake/Output Summary (Last 24 hours) at 07/16/2021 0854 Last data filed at 07/16/2021 0543 Gross per 24 hour  Intake 1408 ml  Output 1650 ml  Net -242 ml    Filed Weights   07/07/21 0630 07/11/21 0452 07/14/21 0923  Weight: 57.7 kg 53.2 kg 56.7 kg    Exam: General exam: In no acute distress. Respiratory system: Good air movement and clear to auscultation. Cardiovascular system: S1 & S2 heard, RRR. No JVD. Gastrointestinal system: Abdomen is nondistended, soft and nontender.  Extremities: Bilateral below the knee amputation with a right wound VAC in place Skin: No rashes, lesions or ulcers Psychiatry: Judgement and insight appear normal. Mood & affect appropriate.    Data Reviewed:    Labs: Basic Metabolic Panel: Recent Labs  Lab 07/10/21 0434 07/11/21 0500 07/12/21 0127 07/13/21 0300 07/14/21 0638 07/15/21 0400  NA 133* 133* 131* 135 137 135  K 3.8 4.2 3.8 3.7 3.6 3.7  CL 101 99 98 100 104 103  CO2 26 28 24 25 26 24   GLUCOSE 98 113* 143* 103* 126* 88  BUN 18 19 23 21 21 22   CREATININE 0.64 0.60* 0.75 0.75 0.76 0.80  CALCIUM 8.7* 8.8* 8.7* 8.8* 8.8* 8.9  MG 2.0  --   --   --   --   --   PHOS 3.2  --   --   --   --   --     GFR Estimated Creatinine Clearance: 66 mL/min (by C-G formula based on SCr of 0.8 mg/dL). Liver Function Tests: No results for  input(s): AST, ALT, ALKPHOS, BILITOT, PROT, ALBUMIN in the last 168 hours. No results for input(s): LIPASE, AMYLASE in the last 168 hours. No results for input(s): AMMONIA in the last 168 hours. Coagulation profile No results for input(s): INR, PROTIME in the last 168 hours. COVID-19 Labs  No results  for input(s): DDIMER, FERRITIN, LDH, CRP in the last 72 hours.  Lab Results  Component Value Date   SARSCOV2NAA NEGATIVE 05/12/2021   SARSCOV2NAA NEGATIVE 05/08/2021   SARSCOV2NAA Not Detected 11/29/2019    CBC: Recent Labs  Lab 07/11/21 0500 07/14/21 0638 07/15/21 0400 07/15/21 1800 07/16/21 0356  WBC 12.0* 10.7* 11.8* 12.7* 11.4*  HGB 8.3* 8.2* 6.7* 9.5* 9.5*  HCT 25.6* 25.8* 20.8* 29.3* 29.1*  MCV 89.5 88.7 87.4 87.7 86.9  PLT 378 419* 381 343 361    Cardiac Enzymes: No results for input(s): CKTOTAL, CKMB, CKMBINDEX, TROPONINI in the last 168 hours. BNP (last 3 results) No results for input(s): PROBNP in the last 8760 hours. CBG: Recent Labs  Lab 07/15/21 0636 07/15/21 1118 07/15/21 1603 07/15/21 2114 07/16/21 0609  GLUCAP 90 97 101* 118* 101*    D-Dimer: No results for input(s): DDIMER in the last 72 hours. Hgb A1c: No results for input(s): HGBA1C in the last 72 hours. Lipid Profile: No results for input(s): CHOL, HDL, LDLCALC, TRIG, CHOLHDL, LDLDIRECT in the last 72 hours. Thyroid function studies: No results for input(s): TSH, T4TOTAL, T3FREE, THYROIDAB in the last 72 hours.  Invalid input(s): FREET3  Anemia work up: No results for input(s): VITAMINB12, FOLATE, FERRITIN, TIBC, IRON, RETICCTPCT in the last 72 hours. Sepsis Labs: Recent Labs  Lab 07/14/21 0638 07/15/21 0400 07/15/21 1800 07/16/21 0356  WBC 10.7* 11.8* 12.7* 11.4*    Microbiology Recent Results (from the past 240 hour(s))  Surgical PCR screen     Status: None   Collection Time: 07/07/21  8:11 AM   Specimen: Nasal Mucosa; Nasal Swab  Result Value Ref Range Status   MRSA, PCR  NEGATIVE NEGATIVE Final   Staphylococcus aureus NEGATIVE NEGATIVE Final    Comment: (NOTE) The Xpert SA Assay (FDA approved for NASAL specimens in patients 5 years of age and older), is one component of a comprehensive surveillance program. It is not intended to diagnose infection nor to guide or monitor treatment. Performed at [facility], [address], [place], [place] [address]      Medications:    aspirin  81 mg Oral Daily   atorvastatin  40 mg Oral QHS   carvedilol  12.5 mg Oral BID WC   Chlorhexidine Gluconate Cloth  6 each Topical Q0600   clopidogrel  75 mg Oral Daily   dapagliflozin propanediol  10 mg Oral Daily   docusate sodium  100 mg Oral BID   feeding supplement  237 mL Oral TID BM   furosemide  40 mg Intravenous Daily   gabapentin  100 mg Oral TID   heparin injection (subcutaneous)  5,000 Units Subcutaneous Q8H   insulin aspart  0-15 Units Subcutaneous TID AC & HS   mouth rinse  15 mL Mouth Rinse BID   methocarbamol  750 mg Oral QID   multivitamin with minerals  1 tablet Oral Daily   pantoprazole  40 mg Oral Daily   polyethylene glycol  17 g Oral Daily   sacubitril-valsartan  1 tablet Oral BID   sennosides  10 mL Oral QHS   sodium chloride flush  10-40 mL Intracatheter Q12H   Continuous Infusions:  sodium chloride 250 mL (07/15/21 0048)   ceFEPime (MAXIPIME) IV 2 g (07/16/21 0046)   magnesium sulfate bolus IVPB        LOS: 19 days   Marinda Elk  [facility]  07/16/2021, 8:54 AM

## 2021-07-17 ENCOUNTER — Inpatient Hospital Stay (HOSPITAL_COMMUNITY): Payer: No Typology Code available for payment source

## 2021-07-17 DIAGNOSIS — J9602 Acute respiratory failure with hypercapnia: Secondary | ICD-10-CM | POA: Diagnosis not present

## 2021-07-17 DIAGNOSIS — D62 Acute posthemorrhagic anemia: Secondary | ICD-10-CM | POA: Diagnosis not present

## 2021-07-17 DIAGNOSIS — J9601 Acute respiratory failure with hypoxia: Secondary | ICD-10-CM | POA: Diagnosis not present

## 2021-07-17 DIAGNOSIS — Z4659 Encounter for fitting and adjustment of other gastrointestinal appliance and device: Secondary | ICD-10-CM | POA: Diagnosis not present

## 2021-07-17 LAB — BASIC METABOLIC PANEL
Anion gap: 6 (ref 5–15)
BUN: 27 mg/dL — ABNORMAL HIGH (ref 8–23)
CO2: 22 mmol/L (ref 22–32)
Calcium: 8.6 mg/dL — ABNORMAL LOW (ref 8.9–10.3)
Chloride: 109 mmol/L (ref 98–111)
Creatinine, Ser: 0.94 mg/dL (ref 0.61–1.24)
GFR, Estimated: >60 mL/min (ref >60)
Glucose, Bld: 92 mg/dL (ref 70–99)
Potassium: 4.6 mmol/L (ref 3.5–5.1)
Sodium: 137 mmol/L (ref 135–145)

## 2021-07-17 LAB — CBC
HCT: 28.1 % — ABNORMAL LOW (ref 39.0–52.0)
Hemoglobin: 8.6 g/dL — ABNORMAL LOW (ref 13.0–17.0)
MCH: 27.5 pg (ref 26.0–34.0)
MCHC: 30.6 g/dL (ref 30.0–36.0)
MCV: 89.8 fL (ref 80.0–100.0)
Platelets: 374 10*3/uL (ref 150–400)
RBC: 3.13 MIL/uL — ABNORMAL LOW (ref 4.22–5.81)
RDW: 15.5 % (ref 11.5–15.5)
WBC: 12.9 10*3/uL — ABNORMAL HIGH (ref 4.0–10.5)
nRBC: 0 % (ref 0.0–0.2)

## 2021-07-17 LAB — GLUCOSE, CAPILLARY
Glucose-Capillary: 97 mg/dL (ref 70–99)
Glucose-Capillary: 97 mg/dL (ref 70–99)
Glucose-Capillary: 145 mg/dL — ABNORMAL HIGH (ref 70–99)
Glucose-Capillary: 93 mg/dL (ref 70–99)
Glucose-Capillary: 131 mg/dL — ABNORMAL HIGH (ref 70–99)

## 2021-07-17 LAB — PHOSPHORUS: Phosphorus: 3.9 mg/dL (ref 2.5–4.6)

## 2021-07-17 LAB — MAGNESIUM: Magnesium: 1.8 mg/dL (ref 1.7–2.4)

## 2021-07-17 MED ORDER — CARVEDILOL 25 MG PO TABS
25.0000 mg | ORAL_TABLET | Freq: Two times a day (BID) | ORAL | Status: DC
  Administered 2021-07-18 – 2021-07-21 (×7): 25 mg
  Filled 2021-07-17 (×7): qty 1

## 2021-07-17 MED ORDER — SACUBITRIL-VALSARTAN 24-26 MG PO TABS
1.0000 | ORAL_TABLET | Freq: Two times a day (BID) | ORAL | Status: DC
  Administered 2021-07-17 – 2021-07-21 (×8): 1
  Filled 2021-07-17 (×8): qty 1

## 2021-07-17 MED ORDER — ATORVASTATIN CALCIUM 40 MG PO TABS
40.0000 mg | ORAL_TABLET | Freq: Every day | ORAL | Status: DC
  Administered 2021-07-17 – 2021-07-24 (×8): 40 mg
  Filled 2021-07-17 (×8): qty 1

## 2021-07-17 MED ORDER — PANTOPRAZOLE SODIUM 40 MG PO PACK
40.0000 mg | PACK | Freq: Every day | ORAL | Status: DC
  Administered 2021-07-18 – 2021-07-23 (×6): 40 mg
  Filled 2021-07-17 (×7): qty 20

## 2021-07-17 MED ORDER — INSULIN ASPART 100 UNIT/ML IJ SOLN
0.0000 [IU] | INTRAMUSCULAR | Status: DC
  Administered 2021-07-18: 2 [IU] via SUBCUTANEOUS
  Administered 2021-07-18: 3 [IU] via SUBCUTANEOUS
  Administered 2021-07-18 (×2): 2 [IU] via SUBCUTANEOUS
  Administered 2021-07-18 – 2021-07-19 (×2): 3 [IU] via SUBCUTANEOUS
  Administered 2021-07-19: 5 [IU] via SUBCUTANEOUS
  Administered 2021-07-19: 3 [IU] via SUBCUTANEOUS
  Administered 2021-07-20: 2 [IU] via SUBCUTANEOUS
  Administered 2021-07-20 – 2021-07-21 (×2): 3 [IU] via SUBCUTANEOUS

## 2021-07-17 MED ORDER — POLYETHYLENE GLYCOL 3350 17 G PO PACK
17.0000 g | PACK | Freq: Every day | ORAL | Status: DC
  Administered 2021-07-18 – 2021-07-23 (×5): 17 g
  Filled 2021-07-17 (×5): qty 1

## 2021-07-17 MED ORDER — METHOCARBAMOL 500 MG PO TABS
750.0000 mg | ORAL_TABLET | Freq: Four times a day (QID) | ORAL | Status: DC
  Administered 2021-07-17 – 2021-07-27 (×34): 750 mg
  Filled 2021-07-17 (×34): qty 2

## 2021-07-17 MED ORDER — ADULT MULTIVITAMIN W/MINERALS CH
1.0000 | ORAL_TABLET | Freq: Every day | ORAL | Status: DC
  Administered 2021-07-18 – 2021-07-25 (×8): 1
  Filled 2021-07-17 (×8): qty 1

## 2021-07-17 MED ORDER — DOCUSATE SODIUM 50 MG/5ML PO LIQD
100.0000 mg | Freq: Two times a day (BID) | ORAL | Status: DC
  Administered 2021-07-17 – 2021-07-26 (×16): 100 mg
  Filled 2021-07-17 (×15): qty 10

## 2021-07-17 MED ORDER — SENNOSIDES 8.8 MG/5ML PO SYRP
10.0000 mL | ORAL_SOLUTION | Freq: Every day | ORAL | Status: DC
  Administered 2021-07-17 – 2021-07-23 (×7): 10 mL
  Filled 2021-07-17 (×10): qty 10

## 2021-07-17 MED ORDER — ASPIRIN 81 MG PO CHEW
81.0000 mg | CHEWABLE_TABLET | Freq: Every day | ORAL | Status: DC
  Administered 2021-07-18 – 2021-07-24 (×7): 81 mg
  Filled 2021-07-17 (×7): qty 1

## 2021-07-17 MED ORDER — CLOPIDOGREL BISULFATE 75 MG PO TABS
75.0000 mg | ORAL_TABLET | Freq: Every day | ORAL | Status: DC
  Administered 2021-07-18 – 2021-07-24 (×7): 75 mg
  Filled 2021-07-17 (×7): qty 1

## 2021-07-17 NOTE — Progress Notes (Signed)
Sacral dressing changed and dated per order, patient premedicated for procedure which was moderately tolerated.

## 2021-07-17 NOTE — Progress Notes (Signed)
Inpatient Diabetes Program Recommendations  AACE/ADA: New Consensus Statement on Inpatient Glycemic Control (2015)  Target Ranges:  Prepandial:   less than 140 mg/dL      Peak postprandial:   less than 180 mg/dL (1-2 hours)      Critically ill patients:  140 - 180 mg/dL   Lab Results  Component Value Date   GLUCAP 145 (H) 07/17/2021   HGBA1C 5.2 05/13/2021    Review of Glycemic Control Results for Todd Duran, Todd Duran (MRN [id number]) as of 07/17/2021 12:50  Ref. Range 07/16/2021 06:09 07/16/2021 11:34 07/16/2021 16:45 07/16/2021 20:33 07/16/2021 21:06 07/16/2021 21:31 07/17/2021 01:15 07/17/2021 04:31 07/17/2021 12:27  Glucose-Capillary Latest Ref Range: 70 - 99 mg/dL 347 (H) 425 (H) 956 (H) 50 (L) 64 (L) 88 97 97 145 (H)   Diabetes history: DM 2 Outpatient Diabetes medications:  Metformin 500 mg q AM Current orders for Inpatient glycemic control:  Novolog moderate tid with meals and HS  Inpatient Diabetes Program Recommendations:    Please reduce Novolog correction to very sensitive (0-6 units) tid with meals.   Thanks,  Beryl Meager, RN, BC-ADM Inpatient Diabetes Coordinator Pager [phone]  (8a-5p)

## 2021-07-17 NOTE — Progress Notes (Addendum)
  Progress Note    07/17/2021 8:21 AM 3 Days Post-Op  Subjective:  eating breakfast that wife brought.  Denies pain in the right leg.    afebrile  Vitals:   07/17/21 0400 07/17/21 0720  BP: 112/73 112/76  Pulse: (!) 112 (!) 109  Resp: 19 16  Temp: 98 F (36.7 C) 98.1 F (36.7 C)  SpO2: 100% 100%    Physical Exam: General:  no distress Lungs:  non labored Incisions:  right groin with vac with good seal.  Right AKA warm; slightly tender to palpation.  Incision looks good.  Left leg incision continues to heal nicely   CBC    Component Value Date/Time   WBC 12.9 (H) 07/17/2021 0434   RBC 3.13 (L) 07/17/2021 0434   HGB 8.6 (L) 07/17/2021 0434   HCT 28.1 (L) 07/17/2021 0434   PLT 374 07/17/2021 0434   MCV 89.8 07/17/2021 0434   MCH 27.5 07/17/2021 0434   MCHC 30.6 07/17/2021 0434   RDW 15.5 07/17/2021 0434   LYMPHSABS 1.9 06/24/2021 0518   MONOABS 0.6 06/24/2021 0518   EOSABS 0.2 06/24/2021 0518   BASOSABS 0.1 06/24/2021 0518    BMET    Component Value Date/Time   NA 137 07/17/2021 0434   K 4.6 07/17/2021 0434   CL 109 07/17/2021 0434   CO2 22 07/17/2021 0434   GLUCOSE 92 07/17/2021 0434   BUN 27 (H) 07/17/2021 0434   CREATININE 0.94 07/17/2021 0434   CALCIUM 8.6 (L) 07/17/2021 0434   GFRNONAA >60 07/17/2021 0434    INR    Component Value Date/Time   INR 1.2 06/30/2021 1608     Intake/Output Summary (Last 24 hours) at 07/17/2021 8250 Last data filed at 07/16/2021 1258 Gross per 24 hour  Intake 120 ml  Output 700 ml  Net -580 ml     Assessment/Plan:  75 y.o. male is s/p:  Right AKA and debridement right groin wound  3 Days Post-Op   -pt doing well this morning from vascular standpoint.  -right AKA stump is warm and incision is healing. -abx for 6 weeks for pseudomonas infection.   -acute blood loss anemia-received transfusion a couple of days ago.  Mild decrease in hgb from yesterday.  Continue to monitor. -PT recommending CIR-consult in  place -continue to encourage po intake for healing.  Cortak to be placed today for enteral feeding for malnutrition. -DVT prophylaxis:  sq heparin   Doreatha Massed, PA-C [facility] [phone] 07/17/2021 8:21 AM  I have interviewed the patient and examined the patient. I agree with the findings by the PA. Continue VAC change MWF. Agree with Nutrition team that enteral feeding is needed to help healing of wounds.   Cari Caraway, MD

## 2021-07-17 NOTE — Progress Notes (Signed)
Hypoglycemic Event  CBG: 50  Treatment: 4 oz juice/soda  Symptoms: None  Follow-up CBG: Time:21:31 CBG Result:88  Possible Reasons for Event: Inadequate meal intake  Comments/MD notified:no    Anne Ng

## 2021-07-17 NOTE — Progress Notes (Addendum)
Progress Note  Patient Name: Todd Duran Date of Encounter: 07/17/2021  [facility] Cardiologist: Parke Poisson, MD   Subjective   Complains for incision pain. No chest pain or dyspnea.   Inpatient Medications    Scheduled Meds:  aspirin  81 mg Oral Daily   atorvastatin  40 mg Oral QHS   carvedilol  25 mg Oral BID WC   Chlorhexidine Gluconate Cloth  6 each Topical Q0600   clopidogrel  75 mg Oral Daily   dapagliflozin propanediol  10 mg Oral Daily   docusate sodium  100 mg Oral BID   feeding supplement  237 mL Oral TID BM   feeding supplement (PROSource TF)  45 mL Per Tube TID   furosemide  40 mg Intravenous Daily   gabapentin  100 mg Oral TID   heparin injection (subcutaneous)  5,000 Units Subcutaneous Q8H   insulin aspart  0-15 Units Subcutaneous TID AC & HS   mouth rinse  15 mL Mouth Rinse BID   methocarbamol  750 mg Oral QID   multivitamin with minerals  1 tablet Oral Daily   pantoprazole  40 mg Oral Daily   polyethylene glycol  17 g Oral Daily   sacubitril-valsartan  1 tablet Oral BID   sennosides  10 mL Oral QHS   sodium chloride flush  10-40 mL Intracatheter Q12H   Continuous Infusions:  sodium chloride 250 mL (07/15/21 0048)   ceFEPime (MAXIPIME) IV 2 g (07/17/21 0031)   feeding supplement (OSMOLITE 1.5 CAL)     magnesium sulfate bolus IVPB     PRN Meds: sodium chloride, acetaminophen **OR** acetaminophen, alum & mag hydroxide-simeth, hydrALAZINE, magnesium sulfate bolus IVPB, metoprolol tartrate, morphine injection, ondansetron, phenol, sodium chloride flush, traMADol   Vital Signs    Vitals:   07/17/21 0400 07/17/21 0720 07/17/21 1001 07/17/21 1052  BP: 112/73 112/76 114/80 113/77  Pulse: (!) 112 (!) 109  (!) 118  Resp: 19 16 20 20   Temp: 98 F (36.7 C) 98.1 F (36.7 C)    TempSrc: Axillary Oral    SpO2: 100% 100%  100%  Weight:      Height:        Intake/Output Summary (Last 24 hours) at 07/17/2021 1126 Last data filed at 07/16/2021  1258 Gross per 24 hour  Intake 120 ml  Output 700 ml  Net -580 ml   Last 3 Weights 07/14/2021 07/11/2021 07/07/2021  Weight (lbs) 125 lb 117 lb 4.6 oz 127 lb 3.3 oz  Weight (kg) 56.7 kg 53.2 kg 57.7 kg      Telemetry    Sinus tachycardia at 110s - Personally Reviewed  ECG    N/A  Physical Exam   GEN: No acute distress.   Neck: No JVD Cardiac: Regular tachycardia, no murmurs, rubs, or gallops.  Respiratory: Clear to auscultation bilaterally. GI: Soft, nontender, non-distended  MS: L AKA and R BKA Neuro:  Nonfocal  Psych: Normal affect   Labs    High Sensitivity Troponin:   Recent Labs  Lab 07/05/21 2052 07/05/21 2253 07/06/21 0202 07/06/21 0631  TROPONINIHS 126* 192* 265* 265*      Chemistry Recent Labs  Lab 07/15/21 0400 07/16/21 1100 07/17/21 0434  NA 135 136 137  K 3.7 2.9* 4.6  CL 103 108 109  CO2 24 22 22   GLUCOSE 88 135* 92  BUN 22 19 27*  CREATININE 0.80 0.69 0.94  CALCIUM 8.9 7.3* 8.6*  GFRNONAA >60 >60 >60  ANIONGAP 8  6 6     Hematology   Radiology    No results found.  Cardiac Studies   Echocardiogram 07/12/2021   1. Left ventricular ejection fraction, by estimation, is 35 to 40%. The  left ventricle has moderately decreased function. Left ventricular  endocardial border not optimally defined to evaluate regional wall motion.   2. Right ventricular systolic function is normal. The right ventricular  size is normal. There is normal pulmonary artery systolic pressure. The  estimated right ventricular systolic pressure is 32.8 mmHg.   3. The mitral valve is abnormal. Moderate mitral valve regurgitation.   4. Tricuspid valve regurgitation is mild to moderate.   5. The aortic valve is abnormal. There is moderate calcification of the  aortic valve. Aortic valve regurgitation is trivial. Mild aortic valve  sclerosis is present, with no evidence of aortic valve stenosis.   Patient Profile     75 y.o. male with a PMH of HTN, HLD, DM type  2, PAD s/p recent L AKA and most recent R BKA, and tobacco abuse seen for newly diagnosed EF of 30 to 40%.  Assessment & Plan    Acute systolic CHF Elevated troponin - Hs troponin 126>>192>>265>>265. Felt demand ischemia - Echo with LVEF of 35-40% >> plan for medical management while admitted - Getting IV lasix 40mg  qd. No strict I & O or daily weight. No breathing issue>> change to PO per MD - Continue Coreg 25mg  BID - Continue Entresto 24/26mg  BID - BP stable but intermittently soft>> if BP allows, can add spironolactone -Avoid SGLT2 inhibitor given pseudomonal groin infection.  3. Sinus tahycardia/MAT - Earlier in admission had MAT - Currently sinus tachy at 110s - Continue Coreg 25mg  BID  4. PAD - Per vascular - On ASA and Plavix  - Continue statin     For questions or updates, please contact [facility] Please consult [email or web address] for contact info under        Signed, , PA  07/17/2021, 11:26 AM     Personally seen and examined. Agree with above.  Continue current coreg 25 bid recently increased. Sinus tachy.   No other changes.  , MD

## 2021-07-17 NOTE — Progress Notes (Addendum)
Mobility Specialist Progress Note   07/17/21 1342  Mobility  Activity Transferred:  Bed to chair  Level of Assistance Maximum assist, patient does 25-49%  Assistive Device Sliding board  Distance Ambulated (ft) 4 ft  Mobility Out of bed to chair with meals  Mobility Response Tolerated fair  Mobility performed by Mobility specialist  Bed Position Chair  $Mobility charge 1 Mobility   Received pt in bed c/o being in an awkward position while laying down, they were unable to reposition themselves. Pt very slow to respond to questions and becomes disoriented in spurts cue's and commands. After +2 repositioning pt still c/o of being uncomfortable and wanted to move to the chair. +2 mod - maxA to get pt EOB. +2 MaxA from EOB to chair via sliding board. After transfer, pt c/o being in 10/10 pain. Call bell was left by pt's side, RN was notified and x-ray tech was left in the room.   Pre Mobility: 112 HR, 93/61 BP, 99% SpO2 Post Mobility: 101 HR, 94/63 BP, 100% SpO2  Frederico Hamman Mobility Specialist Phone Number [phone]

## 2021-07-17 NOTE — Progress Notes (Signed)
Mobility Specialist: Progress Note   07/17/21 1542  Mobility  Activity Transferred:  Chair to bed  Level of Assistance +2 (takes two people)  Assistive Device Sliding board  Mobility Out of bed to chair with meals  Mobility Response Tolerated poorly  Mobility performed by Mobility specialist  $Mobility charge 1 Mobility   Post-Mobility: 108 HR, 92/65 BP, 100% SpO2  Pt positioned sideways in the chair upon entering room. Pt required maxA +2 physical assistance to laterally scoot from the chair to the bed d/t fatigue and being disoriented. Pt back in the bed with call bell at his side and bed alarm is on.   [facility] Cathlene Gardella Mobility Specialist Mobility Specialist Phone: [phone]

## 2021-07-17 NOTE — Procedures (Signed)
Cortrak  Person Inserting Tube:  Todd Duran, Todd Duran, RD Tube Type:  Cortrak - 43 inches Tube Size:  10 Tube Location:  Left nare Initial Placement:  Stomach Secured by: Bridle Technique Used to Measure Tube Placement:  Marking at nare/corner of mouth Cortrak Secured At:  64 cm  Cortrak Tube Team Note:  Consult received to place a Cortrak feeding tube.   X-ray is required, abdominal x-ray has been ordered by the Cortrak team. Please confirm tube placement before using the Cortrak tube.   If the tube becomes dislodged please keep the tube and contact the Cortrak team at [email or web address] ([email or web address]) for replacement.  If after hours and replacement cannot be delayed, place a NG tube and confirm placement with an abdominal x-ray.    Todd Gavia, MS, RD, LDN (she/her/hers) RD pager number and weekend/on-call pager number located in Amion.

## 2021-07-17 NOTE — Progress Notes (Signed)
[facility] CONSULT PROGRESS NOTE    Progress Note  Todd Duran  HEN:[id number] DOB: 14-Nov-1945 DOA: 06/27/2021 PCP: Pcp, No     Brief Narrative:   Todd Duran is an 75 y.o. male with past medical history of diabetes mellitus type 2, essential hypertension, bilateral BKA , on 05/10/2021 patient received left BKA, came back on 06/05/2021 for right toe amputation right and femoral to popliteal artery bypass grafting which was done on 06/10/2021.  On 06/20/2021 patient presented with an infected right foot and on 06/21/2021 had a right BKA.  On 06/23/2021 was sent to CIR, 06/27/2021 vascular surgery was consulted for for bleeding from the groin site and a dropping hemoglobin to 6.9.  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  On 06/30/2021 revision of the right femoral retroperitoneal and right iliac artery exposure, with a redo of the right femoral artery with endarterectomy and placement of multiple Blodgett sutures for hemostasis.  Postop culture grew Pseudomonas, which was treated with IV cefepime.  On 07/03/2021 he had an acute bleeding from his right groin with wound VAC output greater than 500 mL.  Right groin exploration and evacuation was required of the right groin hematoma.  On 07/04/2021 patient was taken back to the OR for reexploration of the right groin with ligation of the right femoropopliteal vein bypass and ligation of the right external iliac artery, excision of the right bovine pericardial patch and excision of right common femoral artery and negative pressure drain to the right groin.  Admitted to the ICU requiring mechanical ventilation subsequently extubated however he developed fluid overload for which she required BiPAP for few days.    Significant events: 8/27: right groin re-exploration and evacuation of hematoma, very friable  vessels, ultimately opted for ligation.  8/28 more comfortable. No signs of bleeding.  But ended up on BIPAP; 2/2 edema seemed to respond to edema  8/29 adding back home meds. Palliative consulted by primary team.  Echocardiogram: Ejection fraction 30 to 35%, global hypokinesis, LV mildly dilated.  Diastolic parameters consistent with grade 2 diastolic dysfunction.  RV size normal, RV function normal.  Lactate elevated at 4 8/30: Quiet evening.  Blood pressure under control.  Lactate cleared to 0.7 bedside and assessment of lower limb, seemed a little bit warmer on exam.  No distress. Overnight patient got SOB. Pulm edema given lasix and placed on bipap 8/31: patient got sob with pulm edema. Placed on bipap for a few hours and lasix given. 9/6: Plan is for revision of the right BKA    Assessment/Plan:   Acute hypercapnic respiratory failure (HCC): Most likely due to pulmonary edema in the setting of systolic heart failure requiring BiPAP for a few days was aggressively diuresed. He was weaned to room air only using BiPAP as needed.  Peripheral artery disease status post recent right below the knee amputation: Patient is being followed by vascular surgery has had several complications requiring ligation of the right femoral-popliteal artery vein bypass ligation of right external iliac artery and excision of the right bovine pericardial patch with excision of the right common femoral artery. He has been transfused several units of packed red blood cells he remains on aspirin and Plavix. Vascular surgery recommended an above-the-knee amputation with debridement of the right groin and wound VAC placement on 07/14/2021. Vascular surgery plan to take dressing down tomorrow. His hemoglobin this morning is  6.7 we will go ahead and transfuse him 2 units of packed red blood cells.  Acute systolic heart failure:  Appears to be newly diagnosed with an EF of 30% and global hypokinesia. There was a mild  elevation in troponins thought to be demand ischemia. Patient was placed on IV diuretics with good response and has been weaned to room air. He remains on aspirin, Plavix, statins, Entresto and Coreg. Free T4 1.2 recommend recheck in 4 to 6 weeks TSH and free T4. Due to new heart failure and EKG changes cardiology was consulted they recommended to continue conservative management as no invasive procedures planned at this time due to his other acute problems and the patient is having no anginal symptoms.  Persistence MAT: Cardiology on board recommended to increase Coreg.  Essential hypertension/hyponatremia: Blood pressure seems to be reasonably controlled hyponatremia is resolved.  Pseudomonal infection of the right groin area: He was started on cefepime on 07/02/2021 plan for 6 weeks of IV empiric antibiotics. Surveillance blood cultures remain negative till date. PICC line placed on 07/05/2021.  Acute blood loss anemia: His hemoglobin this morning 6.7 status post 2 units of packed red Blood cells his hemoglobin this morning is 8.6. His hemoglobin is slowly drifting down, he is currently on aspirin and Plavix. Will FOBT stools.  Also in the differential will be slow loss to the wound VAC.  Diabetes mellitus type 2: With an A1c of 5.2 in July continue sliding scale insulin.  Goals of care: Seen by palliative care patient is a DNR.  Obesity:  Moderate protein caloric malnutrition: Noted. Core track to be placed today for enteral feeding nutrition has been consulted.  Stage II sacral decubitus ulcer RN Pressure Injury Documentation: Pressure Injury 06/05/21 Coccyx Mid;Other (Comment) Stage 2 -  Partial thickness loss of dermis presenting as a shallow open injury with a red, pink wound bed without slough. (Active)  06/05/21 2200  Location: Coccyx  Location Orientation: Mid;Other (Comment)  Staging: Stage 2 -  Partial thickness loss of dermis presenting as a shallow open injury  with a red, pink wound bed without slough.  Wound Description (Comments):   Present on Admission: Yes    DVT prophylaxis: PLavix and asa Family Communication:none Status is: Inpatient  Remains inpatient appropriate because:Hemodynamically unstable  Dispo: The patient is from: Home              Anticipated d/c is to: SNF              Patient currently is not medically stable to d/c.   Difficult to place patient No   Code Status:     Code Status Orders  (From admission, onward)           Start     Ordered   07/07/21 1408  Do not attempt resuscitation (DNR)  Continuous       Question Answer Comment  In the event of cardiac or respiratory ARREST Do not call a "code blue"   In the event of cardiac or respiratory ARREST Do not perform Intubation, CPR, defibrillation or ACLS   In the event of cardiac or respiratory ARREST Use medication by any route, position, wound care, and other measures to relive pain and suffering. May use oxygen, suction and manual treatment of airway obstruction as needed for comfort.      07/07/21 1407           Code Status History     Date Active Date Inactive Code Status  Order ID Comments User Context   06/27/2021 2321 07/07/2021 1407 Full Code [id number]  Dory Horn Inpatient   06/23/2021 1737 06/27/2021 1746 Full Code [id number]  Lynnae Prude Inpatient   06/23/2021 1737 06/23/2021 1737 Full Code [id number]  Lynnae Prude Inpatient   06/05/2021 2201 06/23/2021 1730 Full Code [id number]  Chuck Hint, MD Inpatient   06/05/2021 2201 06/05/2021 2201 Full Code [id number]  Purnell Shoemaker, RN Inpatient   05/25/2021 1513 06/05/2021 2013 Full Code [id number]  Charlton Amor, PA-C Inpatient   05/25/2021 1513 05/25/2021 1513 Full Code [id number]  Charlton Amor, PA-C Inpatient   05/20/2021 1820 05/25/2021 1505 Full Code [id number]  Dara Lords, PA-C Inpatient   05/15/2021 1707 05/20/2021 1428 Full Code [id number]   Charlton Amor, PA-C Inpatient   05/15/2021 1707 05/15/2021 1707 Full Code [id number]  Charlton Amor, PA-C Inpatient   05/12/2021 1502 05/15/2021 1706 Full Code [id number]  Nada Libman, MD Inpatient         IV Access:   Peripheral IV   Procedures and diagnostic studies:   No results found.   Medical Consultants:   None.   Subjective:    [name] awake this morning relates his knee bothers him.  Objective:    Vitals:   07/16/21 2024 07/17/21 0000 07/17/21 0400 07/17/21 0720  BP: 97/69 98/71 112/73 112/76  Pulse:  (!) 110 (!) 112 (!) 109  Resp: 20 20 19 16   Temp: 98.5 F (36.9 C) 97.9 F (36.6 C) 98 F (36.7 C) 98.1 F (36.7 C)  TempSrc: Oral Oral Axillary Oral  SpO2: 98% 100% 100% 100%  Weight:      Height:       SpO2: 100 % O2 Flow Rate (L/min): 2 L/min FiO2 (%): 40 %   Intake/Output Summary (Last 24 hours) at 07/17/2021 0854 Last data filed at 07/16/2021 1258 Gross per 24 hour  Intake 120 ml  Output 700 ml  Net -580 ml    Filed Weights   07/07/21 0630 07/11/21 0452 07/14/21 0923  Weight: 57.7 kg 53.2 kg 56.7 kg    Exam: General exam: In no acute distress. Respiratory system: Good air movement and clear to auscultation. Cardiovascular system: S1 & S2 heard, RRR. No JVD. Gastrointestinal system: Abdomen is nondistended, soft and nontender.  Skin: No rashes, lesions or ulcers Psychiatry: Judgement and insight appear normal. Mood & affect appropriate. Extremities: Bilateral below the knee amputation with a right wound VAC in place   Data Reviewed:    Labs: Basic Metabolic Panel: Recent Labs  Lab 07/13/21 0300 07/14/21 0638 07/15/21 0400 07/16/21 1100 07/17/21 0434  NA 135 137 135 136 137  K 3.7 3.6 3.7 2.9* 4.6  CL 100 104 103 108 109  CO2 25 26 24 22 22   GLUCOSE 103* 126* 88 135* 92  BUN 21 21 22 19  27*  CREATININE 0.75 0.76 0.80 0.69 0.94  CALCIUM 8.8* 8.8* 8.9 7.3* 8.6*    GFR Estimated Creatinine Clearance: 56.1  mL/min (by C-G formula based on SCr of 0.94 mg/dL). Liver Function Tests: No results for input(s): AST, ALT, ALKPHOS, BILITOT, PROT, ALBUMIN in the last 168 hours. No results for input(s): LIPASE, AMYLASE in the last 168 hours. No results for input(s): AMMONIA in the last 168 hours. Coagulation profile No results for input(s): INR, PROTIME in the last 168 hours. COVID-19 Labs  No results for input(s): DDIMER, FERRITIN, LDH, CRP in the last 72 hours.  Lab Results  Component Value Date   SARSCOV2NAA NEGATIVE 05/12/2021   SARSCOV2NAA NEGATIVE 05/08/2021   SARSCOV2NAA Not Detected 11/29/2019    CBC: Recent Labs  Lab 07/14/21 0638 07/15/21 0400 07/15/21 1800 07/16/21 0356 07/17/21 0434  WBC 10.7* 11.8* 12.7* 11.4* 12.9*  HGB 8.2* 6.7* 9.5* 9.5* 8.6*  HCT 25.8* 20.8* 29.3* 29.1* 28.1*  MCV 88.7 87.4 87.7 86.9 89.8  PLT 419* 381 343 361 374    Cardiac Enzymes: No results for input(s): CKTOTAL, CKMB, CKMBINDEX, TROPONINI in the last 168 hours. BNP (last 3 results) No results for input(s): PROBNP in the last 8760 hours. CBG: Recent Labs  Lab 07/16/21 2033 07/16/21 2106 07/16/21 2131 07/17/21 0115 07/17/21 0431  GLUCAP 50* 64* 88 97 97    D-Dimer: No results for input(s): DDIMER in the last 72 hours. Hgb A1c: No results for input(s): HGBA1C in the last 72 hours. Lipid Profile: No results for input(s): CHOL, HDL, LDLCALC, TRIG, CHOLHDL, LDLDIRECT in the last 72 hours. Thyroid function studies: No results for input(s): TSH, T4TOTAL, T3FREE, THYROIDAB in the last 72 hours.  Invalid input(s): FREET3  Anemia work up: No results for input(s): VITAMINB12, FOLATE, FERRITIN, TIBC, IRON, RETICCTPCT in the last 72 hours. Sepsis Labs: Recent Labs  Lab 07/15/21 0400 07/15/21 1800 07/16/21 0356 07/17/21 0434  WBC 11.8* 12.7* 11.4* 12.9*    Microbiology No results found for this or any previous visit (from the past 240 hour(s)).    Medications:    aspirin  81 mg  Oral Daily   atorvastatin  40 mg Oral QHS   carvedilol  25 mg Oral BID WC   Chlorhexidine Gluconate Cloth  6 each Topical Q0600   clopidogrel  75 mg Oral Daily   dapagliflozin propanediol  10 mg Oral Daily   docusate sodium  100 mg Oral BID   feeding supplement  237 mL Oral TID BM   feeding supplement (PROSource TF)  45 mL Per Tube TID   furosemide  40 mg Intravenous Daily   gabapentin  100 mg Oral TID   heparin injection (subcutaneous)  5,000 Units Subcutaneous Q8H   insulin aspart  0-15 Units Subcutaneous TID AC & HS   mouth rinse  15 mL Mouth Rinse BID   methocarbamol  750 mg Oral QID   multivitamin with minerals  1 tablet Oral Daily   pantoprazole  40 mg Oral Daily   polyethylene glycol  17 g Oral Daily   sacubitril-valsartan  1 tablet Oral BID   sennosides  10 mL Oral QHS   sodium chloride flush  10-40 mL Intracatheter Q12H   Continuous Infusions:  sodium chloride 250 mL (07/15/21 0048)   ceFEPime (MAXIPIME) IV 2 g (07/17/21 0031)   feeding supplement (OSMOLITE 1.5 CAL)     magnesium sulfate bolus IVPB        LOS: 20 days   Marinda ElkAbraham Feliz Ortiz  [facility]  07/17/2021, 8:54 AM

## 2021-07-17 NOTE — Progress Notes (Signed)
IP rehab admissions - patient is not yet medically ready to consider for potential inpatient rehab admission.  In addition, he is not participating fully with therapies.  Will have one of my partners follow up on Monday for progress.  Call for questions.  [phone]

## 2021-07-18 DIAGNOSIS — J9602 Acute respiratory failure with hypercapnia: Secondary | ICD-10-CM | POA: Diagnosis not present

## 2021-07-18 LAB — CBC
HCT: 27.4 % — ABNORMAL LOW (ref 39.0–52.0)
Hemoglobin: 8.5 g/dL — ABNORMAL LOW (ref 13.0–17.0)
MCH: 27.8 pg (ref 26.0–34.0)
MCHC: 31 g/dL (ref 30.0–36.0)
MCV: 89.5 fL (ref 80.0–100.0)
Platelets: 379 10*3/uL (ref 150–400)
RBC: 3.06 MIL/uL — ABNORMAL LOW (ref 4.22–5.81)
RDW: 15.7 % — ABNORMAL HIGH (ref 11.5–15.5)
WBC: 11 10*3/uL — ABNORMAL HIGH (ref 4.0–10.5)
nRBC: 0 % (ref 0.0–0.2)

## 2021-07-18 LAB — PHOSPHORUS
Phosphorus: 3.7 mg/dL (ref 2.5–4.6)
Phosphorus: 3.2 mg/dL (ref 2.5–4.6)

## 2021-07-18 LAB — GLUCOSE, CAPILLARY
Glucose-Capillary: 101 mg/dL — ABNORMAL HIGH (ref 70–99)
Glucose-Capillary: 125 mg/dL — ABNORMAL HIGH (ref 70–99)
Glucose-Capillary: 141 mg/dL — ABNORMAL HIGH (ref 70–99)
Glucose-Capillary: 150 mg/dL — ABNORMAL HIGH (ref 70–99)
Glucose-Capillary: 153 mg/dL — ABNORMAL HIGH (ref 70–99)
Glucose-Capillary: 163 mg/dL — ABNORMAL HIGH (ref 70–99)
Glucose-Capillary: 194 mg/dL — ABNORMAL HIGH (ref 70–99)

## 2021-07-18 LAB — MAGNESIUM
Magnesium: 1.8 mg/dL (ref 1.7–2.4)
Magnesium: 2 mg/dL (ref 1.7–2.4)

## 2021-07-18 MED ORDER — ALTEPLASE 2 MG IJ SOLR
2.0000 mg | Freq: Once | INTRAMUSCULAR | Status: AC
  Administered 2021-07-18: 2 mg
  Filled 2021-07-18: qty 2

## 2021-07-18 MED ORDER — FUROSEMIDE 20 MG PO TABS
20.0000 mg | ORAL_TABLET | Freq: Every day | ORAL | Status: DC
  Administered 2021-07-18 – 2021-07-21 (×4): 20 mg via ORAL
  Filled 2021-07-18 (×4): qty 1

## 2021-07-18 NOTE — Progress Notes (Addendum)
  Progress Note    07/18/2021 9:32 AM 4 Days Post-Op  Subjective:  sleeping but wakes to voice  Afebrile 110's 90's-110's systolic 100% RA  Vitals:   07/18/21 0401 07/18/21 0800  BP: 110/70 113/74  Pulse:  (!) 109  Resp: 20 20  Temp:  98.2 F (36.8 C)  SpO2:  100%    Physical Exam: General:  resting comfortably Lungs:  non labored Incisions:  right stump incision healing with staples in tact; right groin with vac with good seal; right abdominal incision looks good with staples in tact.   CBC    Component Value Date/Time   WBC 11.0 (H) 07/18/2021 0445   RBC 3.06 (L) 07/18/2021 0445   HGB 8.5 (L) 07/18/2021 0445   HCT 27.4 (L) 07/18/2021 0445   PLT 379 07/18/2021 0445   MCV 89.5 07/18/2021 0445   MCH 27.8 07/18/2021 0445   MCHC 31.0 07/18/2021 0445   RDW 15.7 (H) 07/18/2021 0445   LYMPHSABS 1.9 06/24/2021 0518   MONOABS 0.6 06/24/2021 0518   EOSABS 0.2 06/24/2021 0518   BASOSABS 0.1 06/24/2021 0518    BMET    Component Value Date/Time   NA 137 07/17/2021 0434   K 4.6 07/17/2021 0434   CL 109 07/17/2021 0434   CO2 22 07/17/2021 0434   GLUCOSE 92 07/17/2021 0434   BUN 27 (H) 07/17/2021 0434   CREATININE 0.94 07/17/2021 0434   CALCIUM 8.6 (L) 07/17/2021 0434   GFRNONAA >60 07/17/2021 0434    INR    Component Value Date/Time   INR 1.2 06/30/2021 1608     Intake/Output Summary (Last 24 hours) at 07/18/2021 0932 Last data filed at 07/18/2021 0100 Gross per 24 hour  Intake 402.67 ml  Output 425 ml  Net -22.33 ml     Assessment/Plan:  75 y.o. male is s/p:  Right AKA and debridement right groin wound  4 Days Post-Op   -pt sleepy this morning -stump looks okay and incision healing.  Right groin with vac with good seal.  Continue M/W/F vac changes. -abx for 6 weeks for pseudomonas infection -DVT prophylaxis:  sq heparin -continue to encourage po intake and enteral nutrition for wound healing  Doreatha Massed, PA-C [facility] [phone] 07/18/2021 9:32 AM  VASCULAR STAFF ADDENDUM: I have independently interviewed and examined the patient. I agree with the above.  Continue VAC to R groin. Continue TF via NGT. Mobilize as able.  Rande Brunt. Lenell Antu, MD [facility] Phone Number: [phone] 07/18/2021 9:42 AM

## 2021-07-18 NOTE — Progress Notes (Signed)
Progress Note  Patient Name: Todd Duran Date of Encounter: 07/18/2021  Primary Cardiologist:   Parke Poisson, MD   Subjective   Denies pain other than in his "knee."  No SOB or chest pain  Inpatient Medications    Scheduled Meds:  aspirin  81 mg Per Tube Daily   atorvastatin  40 mg Per Tube QHS   carvedilol  25 mg Per Tube BID WC   Chlorhexidine Gluconate Cloth  6 each Topical Q0600   clopidogrel  75 mg Per Tube Daily   dapagliflozin propanediol  10 mg Oral Daily   docusate  100 mg Per Tube BID   feeding supplement  237 mL Oral TID BM   feeding supplement (PROSource TF)  45 mL Per Tube TID   furosemide  40 mg Intravenous Daily   gabapentin  100 mg Oral TID   heparin injection (subcutaneous)  5,000 Units Subcutaneous Q8H   insulin aspart  0-15 Units Subcutaneous Q4H   mouth rinse  15 mL Mouth Rinse BID   methocarbamol  750 mg Per Tube QID   multivitamin with minerals  1 tablet Per Tube Daily   pantoprazole sodium  40 mg Per Tube Daily   polyethylene glycol  17 g Per Tube Daily   sacubitril-valsartan  1 tablet Per Tube BID   sennosides  10 mL Per Tube QHS   sodium chloride flush  10-40 mL Intracatheter Q12H   Continuous Infusions:  sodium chloride 250 mL (07/15/21 0048)   ceFEPime (MAXIPIME) IV 2 g (07/18/21 1228)   feeding supplement (OSMOLITE 1.5 CAL) 40 mL/hr at 07/18/21 1234   magnesium sulfate bolus IVPB     PRN Meds: sodium chloride, acetaminophen **OR** acetaminophen, alum & mag hydroxide-simeth, hydrALAZINE, magnesium sulfate bolus IVPB, metoprolol tartrate, morphine injection, ondansetron, phenol, sodium chloride flush, traMADol   Vital Signs    Vitals:   07/18/21 0401 07/18/21 0800 07/18/21 0853 07/18/21 1159  BP: 110/70 113/74  99/68  Pulse:  (!) 109  (!) 106  Resp: 20 20 20 20   Temp:  98.2 F (36.8 C)  99 F (37.2 C)  TempSrc:  Oral  Oral  SpO2:  100%    Weight:      Height:        Intake/Output Summary (Last 24 hours) at 07/18/2021  1316 Last data filed at 07/18/2021 0100 Gross per 24 hour  Intake 282.67 ml  Output 0 ml  Net 282.67 ml   Filed Weights   07/07/21 0630 07/11/21 0452 07/14/21 0923  Weight: 57.7 kg 53.2 kg 56.7 kg    Telemetry    NSR, sinus tach - Personally Reviewed  ECG    NA - Personally Reviewed  Physical Exam   GEN: No acute distress.   Neck: No  JVD Cardiac: RRR, no murmurs, rubs, or gallops.  Respiratory: Clear  to auscultation bilaterally. GI: Soft, nontender, non-distended  MS: No  edema; No deformity. Neuro:  Nonfocal  Psych: Normal affect   Labs    Chemistry Recent Labs  Lab 07/15/21 0400 07/16/21 1100 07/17/21 0434  NA 135 136 137  K 3.7 2.9* 4.6  CL 103 108 109  CO2 24 22 22   GLUCOSE 88 135* 92  BUN 22 19 27*  CREATININE 0.80 0.69 0.94  CALCIUM 8.9 7.3* 8.6*  GFRNONAA >60 >60 >60  ANIONGAP 8 6 6      Hematology Recent Labs  Lab 07/16/21 0356 07/17/21 0434 07/18/21 0445  WBC 11.4* 12.9* 11.0*  RBC 3.35* 3.13* 3.06*  HGB 9.5* 8.6* 8.5*  HCT 29.1* 28.1* 27.4*  MCV 86.9 89.8 89.5  MCH 28.4 27.5 27.8  MCHC 32.6 30.6 31.0  RDW 15.2 15.5 15.7*  PLT 361 374 379    Cardiac EnzymesNo results for input(s): TROPONINI in the last 168 hours. No results for input(s): TROPIPOC in the last 168 hours.   BNPNo results for input(s): BNP, PROBNP in the last 168 hours.   DDimer No results for input(s): DDIMER in the last 168 hours.   Radiology    DG Abd Portable 1V  Result Date: 07/17/2021 CLINICAL DATA:  Feeding tube placement. EXAM: PORTABLE ABDOMEN - 1 VIEW COMPARISON:  07/04/2021 FINDINGS: A new feeding tube terminates in the left upper quadrant in the expected region of the gastric fundus. The small caliber enteric tube on the prior study has been removed. No dilated loops of bowel are seen in the included portion of the abdomen. Aortic atherosclerosis and emphysema are noted. IMPRESSION: Feeding tube terminates in the gastric fundus. Electronically Signed   By:  Sebastian Ache M.D.   On: 07/17/2021 14:18    Cardiac Studies      Echocardiogram 07/12/2021   1. Left ventricular ejection fraction, by estimation, is 35 to 40%. The  left ventricle has moderately decreased function. Left ventricular  endocardial border not optimally defined to evaluate regional wall motion.   2. Right ventricular systolic function is normal. The right ventricular  size is normal. There is normal pulmonary artery systolic pressure. The  estimated right ventricular systolic pressure is 32.8 mmHg.   3. The mitral valve is abnormal. Moderate mitral valve regurgitation.   4. Tricuspid valve regurgitation is mild to moderate.   5. The aortic valve is abnormal. There is moderate calcification of the  aortic valve. Aortic valve regurgitation is trivial. Mild aortic valve  sclerosis is present, with no evidence of aortic valve stenosis.   Patient Profile     75 y.o. male with a PMH of HTN, HLD, DM type 2, PAD s/p recent L AKA and most recent R BKA, and tobacco abuse seen for newly diagnosed EF of 30 to 40%.  Assessment & Plan    ACUTE SYSTOLIC HF:  Intake and output incomplete.  Not getting weighed daily but it is not down from previous.   Tolerating beta blocker, Entresto and [place].  Remains very tachycardic.  I will reduce his Lasix and change to VT     For questions or updates, please contact [facility] Please consult [email or web address] for contact info under Cardiology/STEMI.   Signed, Rollene Rotunda, MD  07/18/2021, 1:16 PM

## 2021-07-18 NOTE — Progress Notes (Signed)
PICC line with good blood return purple port :; red port occluded. RN to notify M.D. for further orders. ( ?D/C line)

## 2021-07-18 NOTE — Progress Notes (Signed)
[facility] CONSULT PROGRESS NOTE    Progress Note  Todd Duran  HEN:[id number] DOB: 14-Nov-1945 DOA: 06/27/2021 PCP: Pcp, No     Brief Narrative:   Todd Duran is an 75 y.o. male with past medical history of diabetes mellitus type 2, essential hypertension, bilateral BKA , on 05/10/2021 patient received left BKA, came back on 06/05/2021 for right toe amputation right and femoral to popliteal artery bypass grafting which was done on 06/10/2021.  On 06/20/2021 patient presented with an infected right foot and on 06/21/2021 had a right BKA.  On 06/23/2021 was sent to CIR, 06/27/2021 vascular surgery was consulted for for bleeding from the groin site and a dropping hemoglobin to 6.9.  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  On 06/30/2021 revision of the right femoral retroperitoneal and right iliac artery exposure, with a redo of the right femoral artery with endarterectomy and placement of multiple Blodgett sutures for hemostasis.  Postop culture grew Pseudomonas, which was treated with IV cefepime.  On 07/03/2021 he had an acute bleeding from his right groin with wound VAC output greater than 500 mL.  Right groin exploration and evacuation was required of the right groin hematoma.  On 07/04/2021 patient was taken back to the OR for reexploration of the right groin with ligation of the right femoropopliteal vein bypass and ligation of the right external iliac artery, excision of the right bovine pericardial patch and excision of right common femoral artery and negative pressure drain to the right groin.  Admitted to the ICU requiring mechanical ventilation subsequently extubated however he developed fluid overload for which she required BiPAP for few days.    Significant events: 8/27: right groin re-exploration and evacuation of hematoma, very friable  vessels, ultimately opted for ligation.  8/28 more comfortable. No signs of bleeding.  But ended up on BIPAP; 2/2 edema seemed to respond to edema  8/29 adding back home meds. Palliative consulted by primary team.  Echocardiogram: Ejection fraction 30 to 35%, global hypokinesis, LV mildly dilated.  Diastolic parameters consistent with grade 2 diastolic dysfunction.  RV size normal, RV function normal.  Lactate elevated at 4 8/30: Quiet evening.  Blood pressure under control.  Lactate cleared to 0.7 bedside and assessment of lower limb, seemed a little bit warmer on exam.  No distress. Overnight patient got SOB. Pulm edema given lasix and placed on bipap 8/31: patient got sob with pulm edema. Placed on bipap for a few hours and lasix given. 9/6: Plan is for revision of the right BKA    Assessment/Plan:   Acute hypercapnic respiratory failure (HCC): Most likely due to pulmonary edema in the setting of systolic heart failure requiring BiPAP for a few days was aggressively diuresed. He was weaned to room air only using BiPAP as needed.  Peripheral artery disease status post recent right below the knee amputation: Patient is being followed by vascular surgery has had several complications requiring ligation of the right femoral-popliteal artery vein bypass ligation of right external iliac artery and excision of the right bovine pericardial patch with excision of the right common femoral artery. He has been transfused several units of packed red blood cells he remains on aspirin and Plavix. Vascular surgery recommended an above-the-knee amputation with debridement of the right groin and wound VAC placement on 07/14/2021. Vascular surgery plan to take dressing down tomorrow. His hemoglobin this morning is  6.7 we will go ahead and transfuse him 2 units of packed red blood cells.  Acute systolic heart failure:  Appears to be newly diagnosed with an EF of 30% and global hypokinesia. There was a mild  elevation in troponins thought to be demand ischemia. Patient was placed on IV diuretics with good response and has been weaned to room air. He remains on aspirin, Plavix, statins, Entresto and Coreg. Free T4 1.2 recommend recheck in 4 to 6 weeks TSH and free T4. Due to new heart failure and EKG changes cardiology was consulted they recommended to continue conservative management as no invasive procedures planned at this time due to his other acute problems and the patient is having no anginal symptoms.  Persistence MAT: Cardiology on board Coreg on max dose still tachycardic. Further management per cardiology.  Essential hypertension/hyponatremia: Blood pressure seems to be reasonably controlled hyponatremia is resolved.  Pseudomonal infection of the right groin area: He was started on cefepime on 07/02/2021 plan for 6 weeks of IV empiric antibiotics. Surveillance blood cultures remain negative till date. PICC line placed on 07/05/2021.  Acute blood loss anemia: His hemoglobin this morning 6.7 status post 2 units of packed red Blood cells his hemoglobin this morning is 8.6. His hemoglobin is slowly drifting down, he is currently on aspirin and Plavix. Will FOBT stools.  Also in the differential will be slow loss to the wound VAC.  Diabetes mellitus type 2: With an A1c of 5.2 in July continue sliding scale insulin.  Goals of care: Seen by palliative care patient is a DNR.  Obesity:  Moderate protein caloric malnutrition: Noted. Core track to be placed today for enteral feeding nutrition has been consulted.  Stage II sacral decubitus ulcer RN Pressure Injury Documentation: Pressure Injury 06/05/21 Coccyx Mid;Other (Comment) Stage 2 -  Partial thickness loss of dermis presenting as a shallow open injury with a red, pink wound bed without slough. (Active)  06/05/21 2200  Location: Coccyx  Location Orientation: Mid;Other (Comment)  Staging: Stage 2 -  Partial thickness loss of  dermis presenting as a shallow open injury with a red, pink wound bed without slough.  Wound Description (Comments):   Present on Admission: Yes    DVT prophylaxis: PLavix and asa Family Communication:none Status is: Inpatient  Remains inpatient appropriate because:Hemodynamically unstable  Dispo: The patient is from: Home              Anticipated d/c is to: SNF              Patient currently is not medically stable to d/c.   Difficult to place patient No   Code Status:     Code Status Orders  (From admission, onward)           Start     Ordered   07/07/21 1408  Do not attempt resuscitation (DNR)  Continuous       Question Answer Comment  In the event of cardiac or respiratory ARREST Do not call a "code blue"   In the event of cardiac or respiratory ARREST Do not perform Intubation, CPR, defibrillation or ACLS   In the event of cardiac or respiratory ARREST Use medication by any route, position, wound care, and other measures to relive pain and suffering. May use oxygen, suction and manual treatment of airway obstruction as needed for comfort.      07/07/21 1407           Code Status History  Date Active Date Inactive Code Status Order ID Comments User Context   06/27/2021 2321 07/07/2021 1407 Full Code [id number]  Dory Horn Inpatient   06/23/2021 1737 06/27/2021 1746 Full Code [id number]  Charlton Amor, PA-C Inpatient   06/23/2021 1737 06/23/2021 1737 Full Code [id number]  Charlton Amor, PA-C Inpatient   06/05/2021 2201 06/23/2021 1730 Full Code [id number]  Chuck Hint, MD Inpatient   06/05/2021 2201 06/05/2021 2201 Full Code [id number]  Purnell Shoemaker, RN Inpatient   05/25/2021 1513 06/05/2021 2013 Full Code [id number]  Charlton Amor, PA-C Inpatient   05/25/2021 1513 05/25/2021 1513 Full Code [id number]  Charlton Amor, PA-C Inpatient   05/20/2021 1820 05/25/2021 1505 Full Code [id number]  Dara Lords, PA-C Inpatient   05/15/2021 1707  05/20/2021 1428 Full Code [id number]  Charlton Amor, PA-C Inpatient   05/15/2021 1707 05/15/2021 1707 Full Code [id number]  Charlton Amor, PA-C Inpatient   05/12/2021 1502 05/15/2021 1706 Full Code [id number]  Nada Libman, MD Inpatient         IV Access:   Peripheral IV   Procedures and diagnostic studies:   DG Abd Portable 1V  Result Date: 07/17/2021 CLINICAL DATA:  Feeding tube placement. EXAM: PORTABLE ABDOMEN - 1 VIEW COMPARISON:  07/04/2021 FINDINGS: A new feeding tube terminates in the left upper quadrant in the expected region of the gastric fundus. The small caliber enteric tube on the prior study has been removed. No dilated loops of bowel are seen in the included portion of the abdomen. Aortic atherosclerosis and emphysema are noted. IMPRESSION: Feeding tube terminates in the gastric fundus. Electronically Signed   By: Sebastian Ache M.D.   On: 07/17/2021 14:18     Medical Consultants:   None.   Subjective:    [name] awake this morning no complaints.  Objective:    Vitals:   07/18/21 0200 07/18/21 0400 07/18/21 0401 07/18/21 0800  BP: 97/63  110/70 113/74  Pulse:    (!) 109  Resp: 18 (!) 30 20 20   Temp:    98.2 F (36.8 C)  TempSrc:  Oral  Oral  SpO2:  100%  100%  Weight:      Height:       SpO2: 100 % O2 Flow Rate (L/min): 2 L/min FiO2 (%): 40 %   Intake/Output Summary (Last 24 hours) at 07/18/2021 0912 Last data filed at 07/18/2021 0100 Gross per 24 hour  Intake 402.67 ml  Output 425 ml  Net -22.33 ml    Filed Weights   07/07/21 0630 07/11/21 0452 07/14/21 0923  Weight: 57.7 kg 53.2 kg 56.7 kg    Exam: General exam: In no acute distress. Respiratory system: Good air movement and clear to auscultation. Cardiovascular system: S1 & S2 heard, RRR. No JVD. Gastrointestinal system: Abdomen is nondistended, soft and nontender.  Psychiatry: Judgement and insight appear normal. Mood & affect appropriate. Extremities: Bilateral below the  knee amputation with a right wound VAC in place   Data Reviewed:    Labs: Basic Metabolic Panel: Recent Labs  Lab 07/13/21 0300 07/14/21 0638 07/15/21 0400 07/16/21 1100 07/17/21 0434 07/17/21 2105 07/18/21 0445  NA 135 137 135 136 137  --   --   K 3.7 3.6 3.7 2.9* 4.6  --   --   CL 100 104 103 108 109  --   --   CO2 25 26 24 22 22   --   --   GLUCOSE 103* 126*  88 135* 92  --   --   BUN 21 21 22 19  27*  --   --   CREATININE 0.75 0.76 0.80 0.69 0.94  --   --   CALCIUM 8.8* 8.8* 8.9 7.3* 8.6*  --   --   MG  --   --   --   --   --  1.8 1.8  PHOS  --   --   --   --   --  3.9 3.7    GFR Estimated Creatinine Clearance: 56.1 mL/min (by C-G formula based on SCr of 0.94 mg/dL). Liver Function Tests: No results for input(s): AST, ALT, ALKPHOS, BILITOT, PROT, ALBUMIN in the last 168 hours. No results for input(s): LIPASE, AMYLASE in the last 168 hours. No results for input(s): AMMONIA in the last 168 hours. Coagulation profile No results for input(s): INR, PROTIME in the last 168 hours. COVID-19 Labs  No results for input(s): DDIMER, FERRITIN, LDH, CRP in the last 72 hours.  Lab Results  Component Value Date   SARSCOV2NAA NEGATIVE 05/12/2021   SARSCOV2NAA NEGATIVE 05/08/2021   SARSCOV2NAA Not Detected 11/29/2019    CBC: Recent Labs  Lab 07/15/21 0400 07/15/21 1800 07/16/21 0356 07/17/21 0434 07/18/21 0445  WBC 11.8* 12.7* 11.4* 12.9* 11.0*  HGB 6.7* 9.5* 9.5* 8.6* 8.5*  HCT 20.8* 29.3* 29.1* 28.1* 27.4*  MCV 87.4 87.7 86.9 89.8 89.5  PLT 381 343 361 374 379    Cardiac Enzymes: No results for input(s): CKTOTAL, CKMB, CKMBINDEX, TROPONINI in the last 168 hours. BNP (last 3 results) No results for input(s): PROBNP in the last 8760 hours. CBG: Recent Labs  Lab 07/17/21 1624 07/17/21 2021 07/18/21 0021 07/18/21 0432 07/18/21 0801  GLUCAP 93 131* 153* 150* 141*    D-Dimer: No results for input(s): DDIMER in the last 72 hours. Hgb A1c: No results for  input(s): HGBA1C in the last 72 hours. Lipid Profile: No results for input(s): CHOL, HDL, LDLCALC, TRIG, CHOLHDL, LDLDIRECT in the last 72 hours. Thyroid function studies: No results for input(s): TSH, T4TOTAL, T3FREE, THYROIDAB in the last 72 hours.  Invalid input(s): FREET3  Anemia work up: No results for input(s): VITAMINB12, FOLATE, FERRITIN, TIBC, IRON, RETICCTPCT in the last 72 hours. Sepsis Labs: Recent Labs  Lab 07/15/21 1800 07/16/21 0356 07/17/21 0434 07/18/21 0445  WBC 12.7* 11.4* 12.9* 11.0*    Microbiology No results found for this or any previous visit (from the past 240 hour(s)).    Medications:    aspirin  81 mg Per Tube Daily   atorvastatin  40 mg Per Tube QHS   carvedilol  25 mg Per Tube BID WC   Chlorhexidine Gluconate Cloth  6 each Topical Q0600   clopidogrel  75 mg Per Tube Daily   dapagliflozin propanediol  10 mg Oral Daily   docusate  100 mg Per Tube BID   feeding supplement  237 mL Oral TID BM   feeding supplement (PROSource TF)  45 mL Per Tube TID   furosemide  40 mg Intravenous Daily   gabapentin  100 mg Oral TID   heparin injection (subcutaneous)  5,000 Units Subcutaneous Q8H   insulin aspart  0-15 Units Subcutaneous Q4H   mouth rinse  15 mL Mouth Rinse BID   methocarbamol  750 mg Per Tube QID   multivitamin with minerals  1 tablet Per Tube Daily   pantoprazole sodium  40 mg Per Tube Daily   polyethylene glycol  17 g Per Tube Daily  sacubitril-valsartan  1 tablet Per Tube BID   sennosides  10 mL Per Tube QHS   sodium chloride flush  10-40 mL Intracatheter Q12H   Continuous Infusions:  sodium chloride 250 mL (07/15/21 0048)   ceFEPime (MAXIPIME) IV 2 g (07/18/21 0052)   feeding supplement (OSMOLITE 1.5 CAL) 30 mL/hr at 07/18/21 0200   magnesium sulfate bolus IVPB        LOS: 21 days   Marinda Elk  [facility]  07/18/2021, 9:12 AM

## 2021-07-19 DIAGNOSIS — J9602 Acute respiratory failure with hypercapnia: Secondary | ICD-10-CM | POA: Diagnosis not present

## 2021-07-19 LAB — GLUCOSE, CAPILLARY
Glucose-Capillary: 131 mg/dL — ABNORMAL HIGH (ref 70–99)
Glucose-Capillary: 145 mg/dL — ABNORMAL HIGH (ref 70–99)
Glucose-Capillary: 153 mg/dL — ABNORMAL HIGH (ref 70–99)
Glucose-Capillary: 215 mg/dL — ABNORMAL HIGH (ref 70–99)
Glucose-Capillary: 81 mg/dL (ref 70–99)
Glucose-Capillary: 91 mg/dL (ref 70–99)

## 2021-07-19 LAB — MAGNESIUM
Magnesium: 1.8 mg/dL (ref 1.7–2.4)
Magnesium: 2 mg/dL (ref 1.7–2.4)

## 2021-07-19 LAB — PHOSPHORUS
Phosphorus: 2.7 mg/dL (ref 2.5–4.6)
Phosphorus: 2.7 mg/dL (ref 2.5–4.6)

## 2021-07-19 MED ORDER — INSULIN DETEMIR 100 UNIT/ML ~~LOC~~ SOLN
10.0000 [IU] | Freq: Two times a day (BID) | SUBCUTANEOUS | Status: DC
  Administered 2021-07-19 – 2021-07-24 (×11): 10 [IU] via SUBCUTANEOUS
  Filled 2021-07-19 (×13): qty 0.1

## 2021-07-19 NOTE — Progress Notes (Signed)
[facility] CONSULT PROGRESS NOTE    Progress Note  DOMENICO ACHORD  QQV:[id number] DOB: 10-May-1946 DOA: 06/27/2021 PCP: Pcp, No     Brief Narrative:   Todd Duran is an 75 y.o. male with past medical history of diabetes mellitus type 2, essential hypertension, bilateral BKA , on 05/10/2021 patient received left BKA, came back on 06/05/2021 for right toe amputation right and femoral to popliteal artery bypass grafting which was done on 06/10/2021.  On 06/20/2021 patient presented with an infected right foot and on 06/21/2021 had a right BKA.  On 06/23/2021 was sent to CIR, 06/27/2021 vascular surgery was consulted for for bleeding from the groin site and a dropping hemoglobin to 6.9.  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  On 06/30/2021 revision of the right femoral retroperitoneal and right iliac artery exposure, with a redo of the right femoral artery with endarterectomy and placement of multiple Blodgett sutures for hemostasis.  Postop culture grew Pseudomonas, which was treated with IV cefepime.  On 07/03/2021 he had an acute bleeding from his right groin with wound VAC output greater than 500 mL.  Right groin exploration and evacuation was required of the right groin hematoma.  On 07/04/2021 patient was taken back to the OR for reexploration of the right groin with ligation of the right femoropopliteal vein bypass and ligation of the right external iliac artery, excision of the right bovine pericardial patch and excision of right common femoral artery and negative pressure drain to the right groin.  Admitted to the ICU requiring mechanical ventilation subsequently extubated however he developed fluid overload for which she required BiPAP for few days.  Cardiology was consulted as he appears to be newly diagnosed with an EF of 30% and global hypokinesia.  There was a mild elevation in troponins thought to be demand ischemia.Patient was placed on IV diuretics with good response and has been weaned to room air.    Significant events: 8/27: right groin re-exploration and evacuation of hematoma, very friable vessels, ultimately opted for ligation.  8/28 more comfortable. No signs of bleeding.  But ended up on BIPAP; 2/2 edema seemed to respond to edema  8/29 adding back home meds. Palliative consulted by primary team.  Echocardiogram: Ejection fraction 30 to 35%, global hypokinesis, LV mildly dilated.  Diastolic parameters consistent with grade 2 diastolic dysfunction.  RV size normal, RV function normal.  Lactate elevated at 4 8/30: Quiet evening.  Blood pressure under control.  Lactate cleared to 0.7 bedside and assessment of lower limb, seemed a little bit warmer on exam.  No distress. Overnight patient got SOB. Pulm edema given lasix and placed on bipap 8/31: patient got sob with pulm edema. Placed on bipap for a few hours and lasix given. 9/6: Plan is for revision of the right BKA    Assessment/Plan:   Acute hypercapnic respiratory failure (HCC): Most likely due to pulmonary edema in the setting of systolic heart failure requiring BiPAP for a few days was aggressively diuresed. He was weaned to room air only using BiPAP as needed.  Peripheral artery disease status post recent right below the knee amputation: He has been transfused several units of packed red blood cells he remains on aspirin and Plavix. Vascular surgery recommended an above-the-knee amputation with debridement of the right groin and wound VAC placement on 07/14/2021. Continue further management per vascular surgery.  Acute systolic  heart failure:  He remains on aspirin, Plavix, statins, Entresto and Coreg. Free T4 1.2 recommend recheck in 4 to 6 weeks TSH and free T4. Due to new heart failure and EKG changes cardiology was consulted they recommended to continue conservative  management as no invasive procedures planned at this time due to his other acute problems and the patient is having no anginal symptoms. Lasix referred to us by cardiology.  Persistence MAT: Still with persistent sinus tachycardia. Further management per cardiology.  Essential hypertension/hyponatremia: Blood pressure seems to be reasonably controlled hyponatremia is resolved.  Pseudomonal infection of the right groin area: He was started on cefepime on 07/02/2021 plan for 6 weeks of IV empiric antibiotics. Surveillance blood cultures remain negative till date. PICC line placed on 07/05/2021.  Acute blood loss anemia: His hemoglobin this morning 6.7 status post 2 units of packed red Blood cells his hemoglobin this morning is 8.6. His hemoglobin is slowly drifting down, he is currently on aspirin and Plavix. Will FOBT stools.  Also in the differential will be slow loss to the wound VAC.  Diabetes mellitus type 2: With an A1c of 5.2 in July continue sliding scale insulin.  Goals of care: Seen by palliative care patient is a DNR.  Obesity:  Moderate protein caloric malnutrition: Noted. Continue tube feeding.  Core track.  Stage II sacral decubitus ulcer RN Pressure Injury Documentation: Pressure Injury 06/05/21 Coccyx Mid;Other (Comment) Stage 2 -  Partial thickness loss of dermis presenting as a shallow open injury with a red, pink wound bed without slough. (Active)  06/05/21 2200  Location: Coccyx  Location Orientation: Mid;Other (Comment)  Staging: Stage 2 -  Partial thickness loss of dermis presenting as a shallow open injury with a red, pink wound bed without slough.  Wound Description (Comments):   Present on Admission: Yes    DVT prophylaxis: PLavix and asa Family Communication:none Status is: Inpatient  Remains inpatient appropriate because:Hemodynamically unstable  Dispo: The patient is from: Home              Anticipated d/c is to: SNF              Patient  currently is not medically stable to d/c.   Difficult to place patient No   Code Status:     Code Status Orders  (From admission, onward)           Start     Ordered   07/07/21 1408  Do not attempt resuscitation (DNR)  Continuous       Question Answer Comment  In the event of cardiac or respiratory ARREST Do not call a "code blue"   In the event of cardiac or respiratory ARREST Do not perform Intubation, CPR, defibrillation or ACLS   In the event of cardiac or respiratory ARREST Use medication by any route, position, wound care, and other measures to relive pain and suffering. May use oxygen, suction and manual treatment of airway obstruction as needed for comfort.      07/07/21 1407           Code Status History     Date Active Date Inactive Code Status Order ID Comments User Context   06/27/2021 2321 07/07/2021 1407 Full Code [id number]  Dory HornBaglia, Corrina, PA-C Inpatient   06/23/2021 1737 06/27/2021 1746 Full Code [id number]  Lynnae Prudengiulli, Daniel J, PA-C Inpatient   06/23/2021 1737 06/23/2021 1737 Full Code [id number]  Lynnae Prudengiulli, Daniel J, PA-C Inpatient   06/05/2021 2201 06/23/2021 1730 Full Code [id number]  Chuck Hint, MD Inpatient   06/05/2021 2201 06/05/2021 2201 Full Code [id number]  Purnell Shoemaker, RN Inpatient   05/25/2021 1513 06/05/2021 2013 Full Code [id number]  Charlton Amor, PA-C Inpatient   05/25/2021 1513 05/25/2021 1513 Full Code [id number]  Charlton Amor, PA-C Inpatient   05/20/2021 1820 05/25/2021 1505 Full Code [id number]  Dara Lords, PA-C Inpatient   05/15/2021 1707 05/20/2021 1428 Full Code [id number]  Charlton Amor, PA-C Inpatient   05/15/2021 1707 05/15/2021 1707 Full Code [id number]  Charlton Amor, PA-C Inpatient   05/12/2021 1502 05/15/2021 1706 Full Code [id number]  Nada Libman, MD Inpatient         IV Access:   Peripheral IV   Procedures and diagnostic studies:   DG Abd Portable 1V  Result Date: 07/17/2021 CLINICAL DATA:  Feeding  tube placement. EXAM: PORTABLE ABDOMEN - 1 VIEW COMPARISON:  07/04/2021 FINDINGS: A new feeding tube terminates in the left upper quadrant in the expected region of the gastric fundus. The small caliber enteric tube on the prior study has been removed. No dilated loops of bowel are seen in the included portion of the abdomen. Aortic atherosclerosis and emphysema are noted. IMPRESSION: Feeding tube terminates in the gastric fundus. Electronically Signed   By: Sebastian Ache M.D.   On: 07/17/2021 14:18     Medical Consultants:   None.   Subjective:    ERICBERTO PADGET no complains  Objective:    Vitals:   07/18/21 2338 07/19/21 0345 07/19/21 0453 07/19/21 0800  BP: 104/65 123/74  129/74  Pulse: 100   100  Resp: 20 18  16   Temp: 98.6 F (37 C) 98.3 F (36.8 C)  98.4 F (36.9 C)  TempSrc: Oral Oral  Oral  SpO2: 100% 98%  100%  Weight:   47.1 kg   Height:       SpO2: 100 % O2 Flow Rate (L/min): 2 L/min FiO2 (%): 40 %   Intake/Output Summary (Last 24 hours) at 07/19/2021 0842 Last data filed at 07/19/2021 0500 Gross per 24 hour  Intake 867.33 ml  Output 850 ml  Net 17.33 ml    Filed Weights   07/11/21 0452 07/14/21 0923 07/19/21 0453  Weight: 53.2 kg 56.7 kg 47.1 kg    Exam: General exam: In no acute distress. Respiratory system: Good air movement and clear to auscultation. Cardiovascular system: S1 & S2 heard, RRR. No JVD. Gastrointestinal system: Abdomen is nondistended, soft and nontender.  Psychiatry: Judgement and insight appear normal. Mood & affect appropriate. Extremities: Bilateral below the knee amputation with a right wound VAC in place   Data Reviewed:    Labs: Basic Metabolic Panel: Recent Labs  Lab 07/13/21 0300 07/14/21 0638 07/15/21 0400 07/16/21 1100 07/17/21 0434 07/17/21 2105 07/18/21 0445 07/18/21 1700 07/19/21 0542  NA 135 137 135 136 137  --   --   --   --   K 3.7 3.6 3.7 2.9* 4.6  --   --   --   --   CL 100 104 103 108 109  --    --   --   --   CO2 25 26 24 22 22   --   --   --   --   GLUCOSE 103* 126* 88 135* 92  --   --   --   --   BUN 21 21 22 19  27*  --   --   --   --  CREATININE 0.75 0.76 0.80 0.69 0.94  --   --   --   --   CALCIUM 8.8* 8.8* 8.9 7.3* 8.6*  --   --   --   --   MG  --   --   --   --   --  1.8 1.8 2.0 1.8  PHOS  --   --   --   --   --  3.9 3.7 3.2 2.7    GFR Estimated Creatinine Clearance: 46.6 mL/min (by C-G formula based on SCr of 0.94 mg/dL). Liver Function Tests: No results for input(s): AST, ALT, ALKPHOS, BILITOT, PROT, ALBUMIN in the last 168 hours. No results for input(s): LIPASE, AMYLASE in the last 168 hours. No results for input(s): AMMONIA in the last 168 hours. Coagulation profile No results for input(s): INR, PROTIME in the last 168 hours. COVID-19 Labs  No results for input(s): DDIMER, FERRITIN, LDH, CRP in the last 72 hours.  Lab Results  Component Value Date   SARSCOV2NAA NEGATIVE 05/12/2021   SARSCOV2NAA NEGATIVE 05/08/2021   SARSCOV2NAA Not Detected 11/29/2019    CBC: Recent Labs  Lab 07/15/21 0400 07/15/21 1800 07/16/21 0356 07/17/21 0434 07/18/21 0445  WBC 11.8* 12.7* 11.4* 12.9* 11.0*  HGB 6.7* 9.5* 9.5* 8.6* 8.5*  HCT 20.8* 29.3* 29.1* 28.1* 27.4*  MCV 87.4 87.7 86.9 89.8 89.5  PLT 381 343 361 374 379    Cardiac Enzymes: No results for input(s): CKTOTAL, CKMB, CKMBINDEX, TROPONINI in the last 168 hours. BNP (last 3 results) No results for input(s): PROBNP in the last 8760 hours. CBG: Recent Labs  Lab 07/18/21 1208 07/18/21 1606 07/18/21 2003 07/18/21 2338 07/19/21 0452  GLUCAP 163* 101* 194* 125* 215*    D-Dimer: No results for input(s): DDIMER in the last 72 hours. Hgb A1c: No results for input(s): HGBA1C in the last 72 hours. Lipid Profile: No results for input(s): CHOL, HDL, LDLCALC, TRIG, CHOLHDL, LDLDIRECT in the last 72 hours. Thyroid function studies: No results for input(s): TSH, T4TOTAL, T3FREE, THYROIDAB in the last 72  hours.  Invalid input(s): FREET3  Anemia work up: No results for input(s): VITAMINB12, FOLATE, FERRITIN, TIBC, IRON, RETICCTPCT in the last 72 hours. Sepsis Labs: Recent Labs  Lab 07/15/21 1800 07/16/21 0356 07/17/21 0434 07/18/21 0445  WBC 12.7* 11.4* 12.9* 11.0*    Microbiology No results found for this or any previous visit (from the past 240 hour(s)).    Medications:    aspirin  81 mg Per Tube Daily   atorvastatin  40 mg Per Tube QHS   carvedilol  25 mg Per Tube BID WC   Chlorhexidine Gluconate Cloth  6 each Topical Q0600   clopidogrel  75 mg Per Tube Daily   dapagliflozin propanediol  10 mg Oral Daily   docusate  100 mg Per Tube BID   feeding supplement  237 mL Oral TID BM   feeding supplement (PROSource TF)  45 mL Per Tube TID   furosemide  20 mg Oral Daily   gabapentin  100 mg Oral TID   heparin injection (subcutaneous)  5,000 Units Subcutaneous Q8H   insulin aspart  0-15 Units Subcutaneous Q4H   mouth rinse  15 mL Mouth Rinse BID   methocarbamol  750 mg Per Tube QID   multivitamin with minerals  1 tablet Per Tube Daily   pantoprazole sodium  40 mg Per Tube Daily   polyethylene glycol  17 g Per Tube Daily   sacubitril-valsartan  1 tablet Per  Tube BID   sennosides  10 mL Per Tube QHS   sodium chloride flush  10-40 mL Intracatheter Q12H   Continuous Infusions:  sodium chloride 250 mL (07/15/21 0048)   ceFEPime (MAXIPIME) IV 2 g (07/19/21 0338)   feeding supplement (OSMOLITE 1.5 CAL) 50 mL/hr at 07/18/21 2000   magnesium sulfate bolus IVPB        LOS: 22 days   Marinda Elk  [facility]  07/19/2021, 8:42 AM

## 2021-07-19 NOTE — Progress Notes (Signed)
Progress Note  Patient Name: Todd Duran Date of Encounter: 07/19/2021  Primary Cardiologist:   Parke Poisson, MD   Subjective   Confused.  Continues to complain of right "knee" pain but denies SOB or chest pain.    Inpatient Medications    Scheduled Meds:  aspirin  81 mg Per Tube Daily   atorvastatin  40 mg Per Tube QHS   carvedilol  25 mg Per Tube BID WC   Chlorhexidine Gluconate Cloth  6 each Topical Q0600   clopidogrel  75 mg Per Tube Daily   dapagliflozin propanediol  10 mg Oral Daily   docusate  100 mg Per Tube BID   feeding supplement  237 mL Oral TID BM   feeding supplement (PROSource TF)  45 mL Per Tube TID   furosemide  20 mg Oral Daily   gabapentin  100 mg Oral TID   heparin injection (subcutaneous)  5,000 Units Subcutaneous Q8H   insulin aspart  0-15 Units Subcutaneous Q4H   insulin detemir  10 Units Subcutaneous BID   mouth rinse  15 mL Mouth Rinse BID   methocarbamol  750 mg Per Tube QID   multivitamin with minerals  1 tablet Per Tube Daily   pantoprazole sodium  40 mg Per Tube Daily   polyethylene glycol  17 g Per Tube Daily   sacubitril-valsartan  1 tablet Per Tube BID   sennosides  10 mL Per Tube QHS   sodium chloride flush  10-40 mL Intracatheter Q12H   Continuous Infusions:  sodium chloride 250 mL (07/15/21 0048)   ceFEPime (MAXIPIME) IV 2 g (07/19/21 0338)   feeding supplement (OSMOLITE 1.5 CAL) 50 mL/hr at 07/18/21 2000   magnesium sulfate bolus IVPB     PRN Meds: sodium chloride, acetaminophen **OR** acetaminophen, alum & mag hydroxide-simeth, hydrALAZINE, magnesium sulfate bolus IVPB, metoprolol tartrate, morphine injection, ondansetron, phenol, sodium chloride flush, traMADol   Vital Signs    Vitals:   07/19/21 0453 07/19/21 0800 07/19/21 1001 07/19/21 1045  BP:  129/74 123/88   Pulse:  100    Resp:  16 20 20   Temp:  98.4 F (36.9 C)    TempSrc:  Oral    SpO2:  100%    Weight: 47.1 kg     Height:        Intake/Output  Summary (Last 24 hours) at 07/19/2021 1252 Last data filed at 07/19/2021 1023 Gross per 24 hour  Intake 987.33 ml  Output 1250 ml  Net -262.67 ml   Filed Weights   07/11/21 0452 07/14/21 0923 07/19/21 0453  Weight: 53.2 kg 56.7 kg 47.1 kg    Telemetry    Sinus tach, atrial tach - Personally Reviewed  ECG    NA - Personally Reviewed  Physical Exam   GEN: No acute distress.   Chronically ill appearing Neck: No  JVD Cardiac: RRR, no murmurs, rubs, or gallops.  Respiratory: Clear  to auscultation bilaterally. GI: Soft, nontender, non-distended, normal bowel sounds  MS:  No edema; No deformity.  Bilateral lower extremity amputations Neuro:   Nonfocal  Psych:   confused but answers questions   Labs    Chemistry Recent Labs  Lab 07/15/21 0400 07/16/21 1100 07/17/21 0434  NA 135 136 137  K 3.7 2.9* 4.6  CL 103 108 109  CO2 24 22 22   GLUCOSE 88 135* 92  BUN 22 19 27*  CREATININE 0.80 0.69 0.94  CALCIUM 8.9 7.3* 8.6*  GFRNONAA >60 >60 >60  ANIONGAP 8 6 6      Hematology Recent Labs  Lab 07/16/21 0356 07/17/21 0434 07/18/21 0445  WBC 11.4* 12.9* 11.0*  RBC 3.35* 3.13* 3.06*  HGB 9.5* 8.6* 8.5*  HCT 29.1* 28.1* 27.4*  MCV 86.9 89.8 89.5  MCH 28.4 27.5 27.8  MCHC 32.6 30.6 31.0  RDW 15.2 15.5 15.7*  PLT 361 374 379    Cardiac EnzymesNo results for input(s): TROPONINI in the last 168 hours. No results for input(s): TROPIPOC in the last 168 hours.   BNPNo results for input(s): BNP, PROBNP in the last 168 hours.   DDimer No results for input(s): DDIMER in the last 168 hours.   Radiology    DG Abd Portable 1V  Result Date: 07/17/2021 CLINICAL DATA:  Feeding tube placement. EXAM: PORTABLE ABDOMEN - 1 VIEW COMPARISON:  07/04/2021 FINDINGS: A new feeding tube terminates in the left upper quadrant in the expected region of the gastric fundus. The small caliber enteric tube on the prior study has been removed. No dilated loops of bowel are seen in the included  portion of the abdomen. Aortic atherosclerosis and emphysema are noted. IMPRESSION: Feeding tube terminates in the gastric fundus. Electronically Signed   By: 07/06/2021 M.D.   On: 07/17/2021 14:18    Cardiac Studies      Echocardiogram 07/12/2021   1. Left ventricular ejection fraction, by estimation, is 35 to 40%. The  left ventricle has moderately decreased function. Left ventricular  endocardial border not optimally defined to evaluate regional wall motion.   2. Right ventricular systolic function is normal. The right ventricular  size is normal. There is normal pulmonary artery systolic pressure. The  estimated right ventricular systolic pressure is 32.8 mmHg.   3. The mitral valve is abnormal. Moderate mitral valve regurgitation.   4. Tricuspid valve regurgitation is mild to moderate.   5. The aortic valve is abnormal. There is moderate calcification of the  aortic valve. Aortic valve regurgitation is trivial. Mild aortic valve  sclerosis is present, with no evidence of aortic valve stenosis.   Patient Profile     75 y.o. male with a PMH of HTN, HLD, DM type 2, PAD s/p recent L AKA and most recent R BKA, and tobacco abuse seen for newly diagnosed EF of 30 to 40%.  Assessment & Plan    ACUTE SYSTOLIC HF:  Intake and output incomplete.  Tolerating beta blocker, Entresto and 65.  Remains tachycardic and it it all sinus.  Lasix reduced yesterday.  BP would not allow further med titration.     No change in therapy.  We will see as needed.    For questions or updates, please contact [facility] Please consult [email or web address] for contact info under Cardiology/STEMI.   Signed, [place], MD  07/19/2021, 12:52 PM

## 2021-07-19 NOTE — Progress Notes (Signed)
[facility] [place] PROGRESS NOTE  ASSESSMENT / PLAN: Todd Duran is a 76 y.o. 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   Right iliofemoral endarterectomy with iliac stenting and right fifth toe amputation 06/05/21.  Right femoral - popliteal artery bypass with GSV, 4/5 ray amputation 06/10/21. 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 Right femoral pseudoaneurysm exploration / repair 06/27/21, 06/30/21, 07/04/21 x2. 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  Now with: Malnutrition.  Continue tube feeding.  Diet as tolerated. Delirium.  Delirium precautions. Acute systolic heart failure.  Continue beta blocker, Entresto, Farxiga.  Continue diuresis.  Needs daily weights. Pseudomonal infection of the right groin.  Continue IV antibiotics x6 weeks.  Needs new PICC line. Disposition planning.  Needs CIR.  SUBJECTIVE: Confused this morning.  In no acute distress.  OBJECTIVE: BP 129/74 (BP Location: Left Arm)   Pulse 100   Temp 98.4 F (36.9 C) (Oral)   Resp 16   Ht 5\' 11"  (1.803 m)   Wt 47.1 kg   SpO2 100%   BMI 14.48 kg/m   Intake/Output Summary (Last 24 hours) at 07/19/2021 0945 Last data filed at 07/19/2021 0500 Gross per 24 hour  Intake 867.33 ml  Output 850 ml  Net 17.33 ml    No acute distress. Tachycardic Unlabored breathing Soft abdomen Right retroperitoneal incision clean dry and intact with staples Right groin VAC with good seal Right above-knee amputation stump appears healthy   CBC Latest Ref Rng & Units 07/18/2021 07/17/2021 07/16/2021  WBC 4.0 - 10.5 K/uL 11.0(H) 12.9(H) 11.4(H)  Hemoglobin 13.0 - 17.0 g/dL 09/15/2021) 4.0(C) 1.4(G)  Hematocrit 39.0 - 52.0 % 27.4(L) 28.1(L) 29.1(L)  Platelets 150 - 400 K/uL 379 374 361     CMP Latest Ref Rng & Units 07/17/2021 07/16/2021 07/15/2021  Glucose 70 - 99 mg/dL 92 09/14/2021) 88  BUN 8 - 23 mg/dL 563(J) 19 22  Creatinine 0.61 - 1.24 mg/dL 49(F 0.26 3.78  Sodium 135 -  145 mmol/L 137 136 135  Potassium 3.5 - 5.1 mmol/L 4.6 2.9(L) 3.7  Chloride 98 - 111 mmol/L 109 108 103  CO2 22 - 32 mmol/L 22 22 24   Calcium 8.9 - 10.3 mg/dL 5.88) 7.3(L) 8.9  Total Protein 6.5 - 8.1 g/dL - - -  Total Bilirubin 0.3 - 1.2 mg/dL - - -  Alkaline Phos 38 - 126 U/L - - -  AST 15 - 41 U/L - - -  ALT 0 - 44 U/L - - -    Estimated Creatinine Clearance: 46.6 mL/min (by C-G formula based on SCr of 0.94 mg/dL).  . 5.0(Y, MD [facility] Phone Number: [phone] 07/19/2021 9:45 AM

## 2021-07-20 ENCOUNTER — Inpatient Hospital Stay: Payer: Self-pay

## 2021-07-20 LAB — CBC WITH DIFFERENTIAL/PLATELET
Abs Immature Granulocytes: 0.08 10*3/uL — ABNORMAL HIGH (ref 0.00–0.07)
Basophils Absolute: 0 10*3/uL (ref 0.0–0.1)
Basophils Relative: 0 %
Eosinophils Absolute: 0.1 10*3/uL (ref 0.0–0.5)
Eosinophils Relative: 1 %
HCT: 24.1 % — ABNORMAL LOW (ref 39.0–52.0)
Hemoglobin: 7.4 g/dL — ABNORMAL LOW (ref 13.0–17.0)
Immature Granulocytes: 1 %
Lymphocytes Relative: 12 %
Lymphs Abs: 1.2 10*3/uL (ref 0.7–4.0)
MCH: 27.7 pg (ref 26.0–34.0)
MCHC: 30.7 g/dL (ref 30.0–36.0)
MCV: 90.3 fL (ref 80.0–100.0)
Monocytes Absolute: 0.8 10*3/uL (ref 0.1–1.0)
Monocytes Relative: 8 %
Neutro Abs: 8 10*3/uL — ABNORMAL HIGH (ref 1.7–7.7)
Neutrophils Relative %: 78 %
Platelets: 318 10*3/uL (ref 150–400)
RBC: 2.67 MIL/uL — ABNORMAL LOW (ref 4.22–5.81)
RDW: 15.7 % — ABNORMAL HIGH (ref 11.5–15.5)
WBC: 10.2 10*3/uL (ref 4.0–10.5)
nRBC: 0 % (ref 0.0–0.2)

## 2021-07-20 LAB — COMPREHENSIVE METABOLIC PANEL
ALT: 12 U/L (ref 0–44)
AST: 15 U/L (ref 15–41)
Albumin: 1.2 g/dL — ABNORMAL LOW (ref 3.5–5.0)
Alkaline Phosphatase: 101 U/L (ref 38–126)
Anion gap: 6 (ref 5–15)
BUN: 54 mg/dL — ABNORMAL HIGH (ref 8–23)
CO2: 22 mmol/L (ref 22–32)
Calcium: 8.7 mg/dL — ABNORMAL LOW (ref 8.9–10.3)
Chloride: 114 mmol/L — ABNORMAL HIGH (ref 98–111)
Creatinine, Ser: 0.93 mg/dL (ref 0.61–1.24)
GFR, Estimated: >60 mL/min (ref >60)
Glucose, Bld: 111 mg/dL — ABNORMAL HIGH (ref 70–99)
Potassium: 4.2 mmol/L (ref 3.5–5.1)
Sodium: 142 mmol/L (ref 135–145)
Total Bilirubin: 0.7 mg/dL (ref 0.3–1.2)
Total Protein: 6.4 g/dL — ABNORMAL LOW (ref 6.5–8.1)

## 2021-07-20 LAB — GLUCOSE, CAPILLARY
Glucose-Capillary: 98 mg/dL (ref 70–99)
Glucose-Capillary: 128 mg/dL — ABNORMAL HIGH (ref 70–99)
Glucose-Capillary: 151 mg/dL — ABNORMAL HIGH (ref 70–99)
Glucose-Capillary: 44 mg/dL — CL (ref 70–99)
Glucose-Capillary: 92 mg/dL (ref 70–99)
Glucose-Capillary: 123 mg/dL — ABNORMAL HIGH (ref 70–99)

## 2021-07-20 LAB — PHOSPHORUS: Phosphorus: 3.2 mg/dL (ref 2.5–4.6)

## 2021-07-20 LAB — MAGNESIUM: Magnesium: 2 mg/dL (ref 1.7–2.4)

## 2021-07-20 MED ORDER — DEXTROSE 50 % IV SOLN
INTRAVENOUS | Status: AC
  Administered 2021-07-20: 25 mL
  Filled 2021-07-20: qty 50

## 2021-07-20 MED ORDER — ALTEPLASE 2 MG IJ SOLR
2.0000 mg | Freq: Once | INTRAMUSCULAR | Status: AC
  Administered 2021-07-20: 2 mg
  Filled 2021-07-20: qty 2

## 2021-07-20 NOTE — Progress Notes (Signed)
Occupational Therapy Treatment Patient Details Name: Todd Duran MRN: [id number] DOB: [birth year] Today's Date: 07/20/2021   History of present illness Pt is a 75 y.o. male admitted with multiple readmissions from CIR for vascular intervention, now readmitted from CIR on 06/27/21 with R groin pseudoaneurysm. S/p exploration, wound vac placed 8/21. Pt with extensive bleeding 8/27 with return to OR emergently x2 s/p external iliac artery ligation and RLE bypass, excision of infected patch in R femoral artery. ETT 8/27-8/28. 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   OT comments  Pt continues to be limited in therapy participation due to confusion. Pt received lying horizontally in bed, able to sit EOB with +2 support but requires hands-on assist to maintain balance/safety. Attempted to guide pt in scooting along bedside in prep for transfers. However, pt unable to follow these directions and attempting to turn in opposite direction to return to supine at foot of bed. Total A x 2 needed to manually return pt to [facility] and elevate at 30* due to new cortrak placement. Due to confusion persistent and limiting therapy participation, updating DC recs from CIR to SNF at this time as lower intensity rehab may be more appropriate. If pt begins to show consistent participation/progress, will monitor and update DC recs as appropriate.   HR up to 129bpm   Recommendations for follow up therapy are one component of a multi-disciplinary discharge planning process, led by the attending physician.  Recommendations may be updated based on patient status, additional functional criteria and insurance authorization.    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 Pt able to pull self forward with 2 person handheld assist to sit EOB. Attempting to guide pt in scooting along bedside to [facility] though pt instead began turning hips to R attempting to lay at foot of bed. Unable to redirect and required total A x 2 manual assist to lay at [facility].    Transfers                 General transfer comment: deferred due to confusion/safety concerns    Balance Overall balance assessment: Needs assistance Sitting-balance support: Feet unsupported;Bilateral upper extremity supported;No upper extremity supported Sitting balance-Leahy Scale: Poor Sitting balance - Comments: Heavily reliant on UE support EOB, on bedrail or from therapist                                   ADL either performed or assessed with clinical judgement   ADL Overall ADL's : Needs assistance/impaired  General ADL Comments: Session focused on following of one step commands, progression of scooting in prep for transfers. However, still significantly limited by confusion and unable to safely follow directions at this time.     Vision   Vision Assessment?: No apparent visual deficits   Perception     Praxis      Cognition Arousal/Alertness: Awake/alert;Lethargic Behavior During  Therapy: Flat affect Overall Cognitive Status: Impaired/Different from baseline Area of Impairment: Attention;Memory;Following commands;Safety/judgement;Awareness;Problem solving;Orientation                 Orientation Level: Disoriented to;Situation Current Attention Level: Focused Memory: Decreased short-term memory;Decreased recall of precautions Following Commands: Follows one step commands inconsistently;Follows one step commands with increased time Safety/Judgement: Decreased awareness of safety;Decreased awareness of deficits Awareness: Intellectual Problem Solving: Slow processing;Decreased initiation;Difficulty sequencing;Requires verbal cues;Requires tactile cues General Comments: Continued confusion and difficulty following commands despite max multimodal cues. Pt able to answer being at [facility] [place] but overall decreased awareness of situation (when asked about new cortrak placement, etc). Pt attempting to lay at foot of bed and when attempting to initiate scooting towards [facility], pt reports "let me lay back a minute" (horizontally in bed)        Exercises     Shoulder Instructions       General Comments HR up to 129 bpm (a fib noted on monitor)    Pertinent Vitals/ Pain       Pain Assessment: Faces Faces Pain Scale: Hurts a little bit Pain Location: generalized with bed mobility Pain Descriptors / Indicators: Grimacing Pain Intervention(s): Monitored during session  Home Living                                          Prior Functioning/Environment              Frequency  Min 2X/week        Progress Toward Goals  OT Goals(current goals can now be found in the care plan section)  Progress towards OT goals: Not progressing toward goals - comment  Acute Rehab OT Goals Patient Stated Goal: none stated OT Goal Formulation: With patient Time For Goal Achievement: 07/28/21 Potential to Achieve Goals: Fair ADL Goals Pt Will  Perform Grooming: with modified independence;sitting Pt Will Perform Lower Body Bathing: with min guard assist;sitting/lateral leans Pt Will Perform Lower Body Dressing: with min guard assist;sitting/lateral leans Pt Will Transfer to Toilet: with min guard assist;with transfer board;anterior/posterior transfer;bedside commode Pt Will Perform Toileting - Clothing Manipulation and hygiene: with modified independence;sitting/lateral leans Pt Will Perform Tub/Shower Transfer: Tub transfer;with supervision;tub bench Pt/caregiver will Perform Home Exercise Program: Increased strength;Both right and left upper extremity;Independently;With written HEP provided Additional ADL Goal #1: Pt to verbalize at least 2 fall prevention strategies to maximize safety during ADLs/transfers  Plan Discharge plan needs to be updated    Co-evaluation                 AM-PAC OT "6 Clicks" Daily Activity     Outcome Measure   Help from another person eating meals?: A Little Help from another person taking care of personal grooming?: A Lot Help from another person toileting, which includes using toliet, bedpan, or urinal?: Total Help from another person bathing (including washing, rinsing, drying)?: A Lot Help from another person to put on and taking off regular upper body clothing?: A Lot Help  from another person to put on and taking off regular lower body clothing?: Total 6 Click Score: 11    End of Session    OT Visit Diagnosis: Other abnormalities of gait and mobility (R26.89);Muscle weakness (generalized) (M62.81);Pain;Other symptoms and signs involving cognitive function Pain - Right/Left: Right Pain - part of body: Leg   Activity Tolerance Other (comment) (limited by cognition)   Patient Left in bed;with call bell/phone within reach;with bed alarm set   Nurse Communication Mobility status (spoke with NT)        Time: 3428-7681 OT Time Calculation (min): 17 min  Charges: OT General  Charges $OT Visit: 1 Visit OT Treatments $Therapeutic Activity: 8-22 mins  Bradd Canary, OTR/L Acute Rehab Services Office: [phone]   Lorre Munroe 07/20/2021, 12:40 PM

## 2021-07-20 NOTE — Progress Notes (Signed)
Physical Therapy Treatment Patient Details Name: Todd Duran MRN: [id number] DOB: 04/17/1946 Today's Date: 07/20/2021   History of Present Illness Pt is a 75 y.o. male admitted with multiple readmissions from CIR for vascular intervention, now readmitted from CIR on 06/27/21 with R groin pseudoaneurysm. S/p exploration, wound vac placed 8/21. Pt with extensive bleeding 8/27 with return to OR emergently x2 s/p external iliac artery ligation and RLE bypass, excision of infected patch in R femoral artery. ETT 8/27-8/28. 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    PT Comments    The pt was seen for continued attempts to increase activity, core strength, balance, and further assess for appropriate d/c plan. The pt remains significantly impacted by confusion and remained restless, unable to follow commands, and generally resistant to attempts of therapist to complete bed mobility or activities. The pt was horizontal in the  bed upon arrival, therapists assisted pt to sitting by pulling the pt to sitting, although he would hold therapists hands at this time, he generally resisted movements at his trunk requiring max-totalA to complete transition to sitting and then required modA of 2 to maintain static sitting. Despite adequate strength in BUE, the pt remains resistant to functional use to follow instructions or complete movement in bed at this time. Due to persistent cognitive deficits and limited tolerance for therapy, I feel SNF rehab at d/c is more appropriate for this pt than CIR. Will continue to attempt to progress mobility and update recommendations as needed.    Recommendations for follow up therapy are one component of a multi-disciplinary discharge planning process, led by the attending physician.  Recommendations may be updated based on patient status, additional functional  criteria and insurance authorization.  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 pt resisting attempts of therapists to elevate trunk to sitting position in bed. pt needing continued modA of therapists to maintain sitting EOB. totalA to return to supine with use of bed pad to position pt lying in bed rather than horizontal across bed (pt's position on arrival)    Transfers                 General transfer comment: deferred due to confusion/safety concerns      Balance Overall balance assessment: Needs assistance Sitting-balance support: Feet unsupported;Bilateral upper extremity supported;No upper extremity supported Sitting balance-Leahy Scale: Poor Sitting balance - Comments: Heavily reliant on UE support EOB, on bedrail or from therapist                                    Cognition  Arousal/Alertness: Awake/alert Behavior During Therapy: Flat affect;Restless;Agitated Overall Cognitive Status: Impaired/Different from baseline Area of Impairment: Attention;Memory;Following commands;Safety/judgement;Awareness;Problem solving;Orientation                  Orientation Level: Disoriented to;Time;Situation (Pt able to state his name and when asked where we are responded "I wish I was at home") Current Attention Level: Focused Memory: Decreased short-term memory;Decreased recall of precautions Following Commands: Follows one step commands inconsistently;Follows one step commands with increased time Safety/Judgement: Decreased awareness of safety;Decreased awareness of deficits Awareness: Intellectual Problem Solving: Slow processing;Decreased initiation;Difficulty sequencing;Requires verbal cues;Requires tactile cues General Comments: pt with continued confusion, minimal responsiveness to questions or cues, finished only one throught/sentence during entire session, otherwise restless and resistive of attempts at mobility. pt laying horizontally in bed upon arrival, resistant to changes in position      Exercises      General Comments General comments (skin integrity, edema, etc.): HR to 120 with sitting activity, RN present and attempting to give oral meds, pt refusing to swallow. Pt R stump and incision swollen compared to left, attempted to elevate      Pertinent Vitals/Pain Pain Assessment: Faces Faces Pain Scale: Hurts even more Pain Location: R stump around incision (also swollen) Pain Descriptors / Indicators: Grimacing Pain Intervention(s): Limited activity within patient's tolerance;Monitored during session;Repositioned     PT Goals (current goals can now be found in the care plan section) Acute Rehab PT Goals Patient Stated Goal: "wait a second" PT Goal Formulation: With patient Time For Goal Achievement: 07/27/21 Potential to Achieve Goals: Fair Progress towards PT goals: Not progressing toward goals - comment (confusion/delirium)    Frequency    Min 2X/week      PT Plan Discharge plan needs to be updated;Frequency needs to be updated       AM-PAC PT "6 Clicks" Mobility   Outcome Measure  Help needed  turning from your back to your side while in a flat bed without using bedrails?: Total Help needed moving from lying on your back to sitting on the side of a flat bed without using bedrails?: Total Help needed moving to and from a bed to a chair (including a wheelchair)?: Total Help needed standing up from a chair using your arms (e.g., wheelchair or bedside chair)?: Total Help needed to walk in hospital room?: Total Help needed climbing 3-5 steps with a railing? : Total 6 Click Score: 6    End of Session   Activity Tolerance: Patient limited by fatigue;Patient limited by pain Patient left: in bed;with call bell/phone within reach;with bed alarm set Nurse Communication: Mobility status;Need for lift equipment PT Visit Diagnosis: Other abnormalities of gait and mobility (R26.89);Pain Pain - Right/Left: Right Pain - part of body: Leg     Time: 2952-8413 PT Time Calculation (min) (ACUTE ONLY): 16 min  Charges:  $Therapeutic Activity: 8-22 mins                     Vickki Muff, PT, DPT   Acute Rehabilitation Department Pager #: [phone]   Ronnie Derby 07/20/2021, 6:23 PM

## 2021-07-20 NOTE — Progress Notes (Addendum)
VASCULAR SURGERY ASSESSMENT & PLAN:   REAL CONA is a 75 y.o. 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   Right iliofemoral endarterectomy with iliac stenting and right fifth toe amputation 06/05/21.  Right femoral - popliteal artery bypass with GSV, 4/5 ray amputation 06/10/21. 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 Right femoral pseudoaneurysm exploration / repair 06/27/21, 06/30/21, 07/04/21 x2. 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   Now with: Malnutrition.  Continue tube feeding>>Osmolite at 50cc/hr.  Diet as tolerated. This morning he seemed to struggle with sips of water this morning>>will get speech therapy eval. Delirium.  Update labs. Acute systolic heart failure.  Continue beta blocker, Entresto, Farxiga.  Lasix reduced yesterday.  Cardiology has signed off. Weight stable last 48 hours. Pseudomonal infection of the right groin.  Remains afebrile. Continue IV antibiotics x 6 weeks. Started 8/25: Needs new PICC line>>will order replacement PAD: statin, aspirin and Plavix continue. DVT prophy: Heparin [place] Disposition planning.  CIR referral placed>>not meeting criteria. TOC following.   SUBJECTIVE:   Awake, alert. Mild confusion. Follow commands. Spoke with Manufacturing systems engineer...has been tolerating PO intake, just not much. One lumen of his PICC line will not aspirate. Placed on 8/28.  PHYSICAL EXAM:   Vitals:   07/19/21 1952 07/19/21 2332 07/20/21 0350 07/20/21 0408  BP: 96/64 102/67 95/68   Pulse: 96 99 (!) 104   Resp: 17 20 20    Temp: 98.4 F (36.9 C) 98.1 F (36.7 C) 98.4 F (36.9 C)   TempSrc: Oral Oral Oral   SpO2: 98% 100% 98%   Weight:    47.1 kg  Height:       General appearance: Awake, alert in no apparent distress Cardiac: Heart rate regular. Slightly tachycardic. Respirations: Nonlabored Incisions: Right groin VAC dressing removed>>granulating (see photo) Right lower quad incision is healing without signs of  infection.  Extremities: Right residual limb: edematous. Staple line is intact without drainage     LABS:   Lab Results  Component Value Date   WBC 11.0 (H) 07/18/2021   HGB 8.5 (L) 07/18/2021   HCT 27.4 (L) 07/18/2021   MCV 89.5 07/18/2021   PLT 379 07/18/2021   Lab Results  Component Value Date   CREATININE 0.94 07/17/2021   Lab Results  Component Value Date   INR 1.2 06/30/2021   CBG (last 3)  Recent Labs    07/19/21 1955 07/19/21 2331 07/20/21 0404  GLUCAP 91 131* 98    PROBLEM LIST:    Principal Problem:   Acute hypercapnic respiratory failure (HCC) Active Problems:   Peripheral artery disease (HCC)   Pseudoaneurysm (HCC)   Encounter for postanesthesia care   Acute blood loss anemia   Pseudomonas infection   Wound infection after surgery   Malnutrition of moderate degree   Acute respiratory failure with hypoxia (HCC)   Pulmonary edema   CURRENT MEDS:    aspirin  81 mg Per Tube Daily   atorvastatin  40 mg Per Tube QHS   carvedilol  25 mg Per Tube BID WC   Chlorhexidine Gluconate Cloth  6 each Topical Q0600   clopidogrel  75 mg Per Tube Daily   dapagliflozin propanediol  10 mg Oral Daily   docusate  100 mg Per Tube BID   feeding supplement  237 mL Oral TID BM   feeding supplement (PROSource TF)  45 mL Per Tube TID   furosemide  20 mg Oral Daily   gabapentin  100 mg  Oral TID   heparin injection (subcutaneous)  5,000 Units Subcutaneous Q8H   insulin aspart  0-15 Units Subcutaneous Q4H   insulin detemir  10 Units Subcutaneous BID   mouth rinse  15 mL Mouth Rinse BID   methocarbamol  750 mg Per Tube QID   multivitamin with minerals  1 tablet Per Tube Daily   pantoprazole sodium  40 mg Per Tube Daily   polyethylene glycol  17 g Per Tube Daily   sacubitril-valsartan  1 tablet Per Tube BID   sennosides  10 mL Per Tube QHS   sodium chloride flush  10-40 mL Intracatheter Q12H   Milinda Antis, PA-C  Office: [phone] 07/20/2021    I have  seen and evaluated the patient, and I agree with the above assessment and plan  [name]

## 2021-07-20 NOTE — Progress Notes (Signed)
Mobility Specialist: Progress Note   07/20/21 1811  Mobility  Activity Dangled on edge of bed  Level of Assistance +2 (takes two people)  Assistive Device None  Mobility Sit up in bed/chair position for meals  Mobility Response Tolerated poorly  Mobility performed by Mobility specialist;Other (comment) (PT Katie)  $Mobility charge 1 Mobility   Pt able to sit EOB with +2 physical assistance. Pt able to state his full name and that he would rather be at home but was unable to answer as to where he was. Pt able to tolerate sitting EOB for roughly two minutes and was positioned back in the bed. RN present in the room.   [facility] Dontasia Miranda Mobility Specialist Mobility Specialist Phone: [phone]

## 2021-07-20 NOTE — Progress Notes (Signed)
Inpatient Rehab Admissions Coordinator:   I do not have a bed for this Pt. On CIR today. During most recent PT session, Pt. Only tolerated bed mobility and required total assist+2. Pt. Will need to demonstrate improved tolerance before he can be considered for readmission to CIR. If tolerance does not improve, he may be more appropriate for SNF.   Megan Salon, MS, CCC-SLP Rehab Admissions Coordinator  [phone] (celll) [phone] (office)

## 2021-07-20 NOTE — Progress Notes (Signed)
Pt this afternoon had a glucose of 44, 0.5 amp dextrose was administered, glucose came up to 92. Glucose is suspected to have dropped due to a disconnection with the tube feeding that occurred sometime this afternoon.   Kalman Jewels, RN 07/20/2021 5:53 PM

## 2021-07-21 ENCOUNTER — Other Ambulatory Visit (HOSPITAL_COMMUNITY): Payer: Self-pay

## 2021-07-21 ENCOUNTER — Inpatient Hospital Stay (HOSPITAL_COMMUNITY): Payer: No Typology Code available for payment source

## 2021-07-21 DIAGNOSIS — J9602 Acute respiratory failure with hypercapnia: Secondary | ICD-10-CM | POA: Diagnosis not present

## 2021-07-21 DIAGNOSIS — R4182 Altered mental status, unspecified: Secondary | ICD-10-CM

## 2021-07-21 DIAGNOSIS — J9601 Acute respiratory failure with hypoxia: Secondary | ICD-10-CM | POA: Diagnosis not present

## 2021-07-21 DIAGNOSIS — D62 Acute posthemorrhagic anemia: Secondary | ICD-10-CM | POA: Diagnosis not present

## 2021-07-21 DIAGNOSIS — Z4659 Encounter for fitting and adjustment of other gastrointestinal appliance and device: Secondary | ICD-10-CM | POA: Diagnosis not present

## 2021-07-21 DIAGNOSIS — B999 Unspecified infectious disease: Secondary | ICD-10-CM

## 2021-07-21 DIAGNOSIS — R627 Adult failure to thrive: Secondary | ICD-10-CM

## 2021-07-21 LAB — GLUCOSE, CAPILLARY
Glucose-Capillary: 101 mg/dL — ABNORMAL HIGH (ref 70–99)
Glucose-Capillary: 103 mg/dL — ABNORMAL HIGH (ref 70–99)
Glucose-Capillary: 149 mg/dL — ABNORMAL HIGH (ref 70–99)
Glucose-Capillary: 163 mg/dL — ABNORMAL HIGH (ref 70–99)
Glucose-Capillary: 49 mg/dL — ABNORMAL LOW (ref 70–99)
Glucose-Capillary: 81 mg/dL (ref 70–99)
Glucose-Capillary: 93 mg/dL (ref 70–99)

## 2021-07-21 LAB — CBC
HCT: 24.3 % — ABNORMAL LOW (ref 39.0–52.0)
Hemoglobin: 7.3 g/dL — ABNORMAL LOW (ref 13.0–17.0)
MCH: 27.3 pg (ref 26.0–34.0)
MCHC: 30 g/dL (ref 30.0–36.0)
MCV: 91 fL (ref 80.0–100.0)
Platelets: 310 10*3/uL (ref 150–400)
RBC: 2.67 MIL/uL — ABNORMAL LOW (ref 4.22–5.81)
RDW: 15.9 % — ABNORMAL HIGH (ref 11.5–15.5)
WBC: 10.1 10*3/uL (ref 4.0–10.5)
nRBC: 0 % (ref 0.0–0.2)

## 2021-07-21 LAB — BASIC METABOLIC PANEL
Anion gap: 8 (ref 5–15)
BUN: 61 mg/dL — ABNORMAL HIGH (ref 8–23)
CO2: 25 mmol/L (ref 22–32)
Calcium: 8.9 mg/dL (ref 8.9–10.3)
Chloride: 111 mmol/L (ref 98–111)
Creatinine, Ser: 1.04 mg/dL (ref 0.61–1.24)
GFR, Estimated: >60 mL/min (ref >60)
Glucose, Bld: 142 mg/dL — ABNORMAL HIGH (ref 70–99)
Potassium: 4.3 mmol/L (ref 3.5–5.1)
Sodium: 144 mmol/L (ref 135–145)

## 2021-07-21 LAB — LACTIC ACID, PLASMA
Lactic Acid, Venous: 2.2 mmol/L (ref 0.5–1.9)
Lactic Acid, Venous: 1.1 mmol/L (ref 0.5–1.9)

## 2021-07-21 LAB — MAGNESIUM: Magnesium: 2.4 mg/dL (ref 1.7–2.4)

## 2021-07-21 LAB — HEMOGLOBIN A1C
Hgb A1c MFr Bld: 5.4 % (ref 4.8–5.6)
Mean Plasma Glucose: 108.28 mg/dL

## 2021-07-21 MED ORDER — METOPROLOL TARTRATE 25 MG PO TABS: 25.0000 mg | ORAL_TABLET | Freq: Three times a day (TID) | ORAL | Status: DC

## 2021-07-21 MED ORDER — DIGOXIN 125 MCG PO TABS
0.1250 mg | ORAL_TABLET | Freq: Every day | ORAL | Status: DC
  Administered 2021-07-21 – 2021-07-24 (×4): 0.125 mg via ORAL
  Filled 2021-07-21 (×4): qty 1

## 2021-07-21 MED ORDER — SODIUM CHLORIDE 0.9 % IV BOLUS
500.0000 mL | Freq: Once | INTRAVENOUS | Status: AC
  Administered 2021-07-21: 500 mL via INTRAVENOUS

## 2021-07-21 MED ORDER — DILTIAZEM HCL 25 MG/5ML IV SOLN
5.0000 mg | Freq: Once | INTRAVENOUS | Status: AC
  Administered 2021-07-21: 5 mg via INTRAVENOUS
  Filled 2021-07-21: qty 5

## 2021-07-21 MED ORDER — VANCOMYCIN HCL IN DEXTROSE 1-5 GM/200ML-% IV SOLN
1000.0000 mg | Freq: Once | INTRAVENOUS | Status: AC
  Administered 2021-07-21: 1000 mg via INTRAVENOUS
  Filled 2021-07-21: qty 200

## 2021-07-21 MED ORDER — DEXTROSE 50 % IV SOLN
INTRAVENOUS | Status: AC
  Administered 2021-07-21: 50 mL
  Filled 2021-07-21: qty 50

## 2021-07-21 MED ORDER — AMIODARONE HCL IN DEXTROSE 360-4.14 MG/200ML-% IV SOLN
30.0000 mg/h | INTRAVENOUS | Status: DC
  Administered 2021-07-21 – 2021-07-23 (×4): 30 mg/h via INTRAVENOUS
  Filled 2021-07-21 (×4): qty 200

## 2021-07-21 MED ORDER — VANCOMYCIN HCL 750 MG/150ML IV SOLN
750.0000 mg | INTRAVENOUS | Status: DC
  Administered 2021-07-22: 750 mg via INTRAVENOUS
  Filled 2021-07-21 (×2): qty 150

## 2021-07-21 MED ORDER — AMIODARONE HCL IN DEXTROSE 360-4.14 MG/200ML-% IV SOLN
60.0000 mg/h | INTRAVENOUS | Status: AC
  Administered 2021-07-21: 60 mg/h via INTRAVENOUS
  Filled 2021-07-21: qty 200

## 2021-07-21 MED ORDER — INSULIN ASPART 100 UNIT/ML IJ SOLN
0.0000 [IU] | Freq: Three times a day (TID) | INTRAMUSCULAR | Status: DC
  Administered 2021-07-22 (×2): 2 [IU] via SUBCUTANEOUS
  Administered 2021-07-22 – 2021-07-23 (×2): 1 [IU] via SUBCUTANEOUS
  Administered 2021-07-24 (×2): 2 [IU] via SUBCUTANEOUS
  Administered 2021-07-24: 1 [IU] via SUBCUTANEOUS
  Administered 2021-07-25: 2 [IU] via SUBCUTANEOUS

## 2021-07-21 NOTE — Progress Notes (Addendum)
  Progress Note    07/21/2021 7:31 AM 7 Days Post-Op  Subjective:  altered mental status   Vitals:   07/21/21 0605 07/21/21 0652  BP: 97/72   Pulse:    Resp: 20 20  Temp:    SpO2:     Physical Exam: Cardiac:  tachycardic Lungs:  non labored Incisions:  R AKA with viable skin edges; R groin incision with prolene suture at wound bed not much healing progress, minimal granulation Extremities:  AKA stumps warm Neurologic: A&O  CBC    Component Value Date/Time   WBC 10.2 07/20/2021 0546   RBC 2.67 (L) 07/20/2021 0546   HGB 7.4 (L) 07/20/2021 0546   HCT 24.1 (L) 07/20/2021 0546   PLT 318 07/20/2021 0546   MCV 90.3 07/20/2021 0546   MCH 27.7 07/20/2021 0546   MCHC 30.7 07/20/2021 0546   RDW 15.7 (H) 07/20/2021 0546   LYMPHSABS 1.2 07/20/2021 0546   MONOABS 0.8 07/20/2021 0546   EOSABS 0.1 07/20/2021 0546   BASOSABS 0.0 07/20/2021 0546    BMET    Component Value Date/Time   NA 142 07/20/2021 0546   K 4.2 07/20/2021 0546   CL 114 (H) 07/20/2021 0546   CO2 22 07/20/2021 0546   GLUCOSE 111 (H) 07/20/2021 0546   BUN 54 (H) 07/20/2021 0546   CREATININE 0.93 07/20/2021 0546   CALCIUM 8.7 (L) 07/20/2021 0546   GFRNONAA >60 07/20/2021 0546    INR    Component Value Date/Time   INR 1.2 06/30/2021 1608     Intake/Output Summary (Last 24 hours) at 07/21/2021 0731 Last data filed at 07/20/2021 1818 Gross per 24 hour  Intake 118 ml  Output 750 ml  Net -632 ml     Assessment/Plan:  75 y.o. male is s/p B AKA 7 Days Post-Op   R AKA warm with viable skin edges R groin vac removed last night; prolene in wound bed, minimal granulation, minimal healing progress; continue wet to dry; continue tube feeds Hypoglycemia: changed to sliding scale Tachycardia: BB adjusted, seems to be sinus tach; defer to Cardiology AMS: morning labs pending; check chest CXR, TRH following   Emilie Rutter, PA-C [facility] [phone] 07/21/2021 7:31 AM

## 2021-07-21 NOTE — Progress Notes (Signed)
Inpatient Rehabilitation Admissions Coordinator   I met with pt's wife at his bedside. I discussed the need for SNF rehab, not CIR. She is in agreement. I have alerted acute team and TOC. We will sign off at this time.   , RN, MSN Rehab Admissions Coordinator [phone] 07/21/2021 10:42 AM  

## 2021-07-21 NOTE — Progress Notes (Signed)
Daily Progress Note   Patient Name: Todd Duran       Date: 07/21/2021 DOB: Jun 05, 1946  Age: 75 y.o. MRN#: [id number] Attending Physician: Nada Libman, MD Primary Care Physician: Pcp, No Admit Date: 06/27/2021  Reason for Consultation/Follow-up: Establishing goals of care  Subjective: Chart Reviewed. Updates Received. Patient Assessed.   Attending requesting more conversation with family regarding GOCs, prognosis is poor.  Patient lying in bed, appears generally uncomfortable.  Speech is garbled, unable to follow commands.  Wife at bedside.  Continue education regarding current medical situation; diagnosis, prognosis, goals of care, end-of-life wishes, disposition and options.  Patient's wife is tearful as we explore concepts specific to adult failure to thrive and the limitations of medical interventions to prolong quality of life when the body does fail to thrive.  She verbalizes an understanding of the seriousness of her husband's current medical situation but asks me to contact his Elissa Hefty for further exploration of goals of care.  Education offered on the difference between and aggressive medical intervention path  and a more palliative comfort path for this patient at this time in this situation.  I spoke to [facility] by telephone she also verbalizes an understanding of the seriousness of Mr. [name] current medical situation.  She is currently out of  town but agrees to meet with the palliative medicine team on Thursday, September 16 at 11 AM.   Questions and concerns addressed, emotional support offered.  Length of Stay: 24 days  Vital Signs: BP (!) 83/43 (BP Location: Left Arm)   Pulse (!) 107   Temp 98.5 F (36.9 C) (Oral)   Resp 20   Ht 5\' 11"  (1.803 m)   Wt 48.3 kg   SpO2 97%   BMI 14.85 kg/m  SpO2: SpO2: 97 % O2 Device: O2 Device: Room Air O2 Flow Rate: O2 Flow Rate (L/min): 2 L/min  Physical Exam: Elderly, thin, AA male RRR Clear bilaterally L  AKA, R BKA with staples intact AAO x3, mood appropriate                Palliative Care Assessment & Plan  HPI: Palliative Care consult requested for goals of care discussion in this 75 y.o. male  with past medical history of diabetes, hypertension, L AKA, PAD, tobacco/alcohol use, and multiple right lower extremity surgeries. 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 Currently being followed by Vascular Surgery.   Patient continues with physical and functional decline   Code Status: DNR  Goals of Care/Recommendations: Continue with current plan of care, family remain hopeful for improvement. Meet with palliative medicine team on Thursday at 11 AM for further discussion and clarification of goals of care and treatment plan.   Prognosis: Long-term prognosis is poor secondary to patient's multiple co morbidities.  Prognosis will be dependent on desire for life prolonging measures.  Discharge Planning: To Be Determined   CIR declined patient  Thank you for allowing the Palliative Medicine Team to assist in the care of this patient.  Time Total:  35 min.   Visit consisted of counseling and education dealing with the complex and emotionally intense issues of symptom management and palliative care in the setting of serious and potentially life-threatening illness.Greater than 50%  of this time was spent counseling and coordinating care related to the above assessment and plan.   Monday NP  Palliative Medicine Team [phone]

## 2021-07-21 NOTE — Progress Notes (Signed)
Pt with cbg of 49. 1 amp of d50 given. Recheck cbg 103.  03:30 Pt heart rate elevating in the 170's afib rvr not sustaining. Gave prn metoprolol. Brought heart rate down to 100's briefly.    05:30 pt heart rate elevating to the 170's not sustaining again. No prn available. Bp 93/69. Page on call provider. New orders given. Will continue to monitor.

## 2021-07-21 NOTE — Progress Notes (Signed)
Progress Note  Patient Name: Todd Duran Date of Encounter: 07/21/2021  [facility] Cardiologist: Parke Poisson, MD   Subjective   Patient is confused, states "no" when asked if he has any chest pain,dizziness, or SOB. He can't elaborate history. HR intermittently up to 150s briefly when he is restless but subsides to 90-100s when he calms down.   Inpatient Medications    Scheduled Meds:  aspirin  81 mg Per Tube Daily   atorvastatin  40 mg Per Tube QHS   Chlorhexidine Gluconate Cloth  6 each Topical Q0600   clopidogrel  75 mg Per Tube Daily   dapagliflozin propanediol  10 mg Oral Daily   docusate  100 mg Per Tube BID   feeding supplement  237 mL Oral TID BM   feeding supplement (PROSource TF)  45 mL Per Tube TID   gabapentin  100 mg Oral TID   heparin injection (subcutaneous)  5,000 Units Subcutaneous Q8H   insulin aspart  0-9 Units Subcutaneous TID WC   insulin detemir  10 Units Subcutaneous BID   mouth rinse  15 mL Mouth Rinse BID   methocarbamol  750 mg Per Tube QID   metoprolol tartrate  25 mg Per Tube TID   multivitamin with minerals  1 tablet Per Tube Daily   pantoprazole sodium  40 mg Per Tube Daily   polyethylene glycol  17 g Per Tube Daily   sacubitril-valsartan  1 tablet Per Tube BID   sennosides  10 mL Per Tube QHS   sodium chloride flush  10-40 mL Intracatheter Q12H   Continuous Infusions:  sodium chloride 250 mL (07/15/21 0048)   ceFEPime (MAXIPIME) IV 2 g (07/21/21 0053)   feeding supplement (OSMOLITE 1.5 CAL) 1,000 mL (07/20/21 2124)   magnesium sulfate bolus IVPB     PRN Meds: sodium chloride, acetaminophen **OR** acetaminophen, alum & mag hydroxide-simeth, hydrALAZINE, magnesium sulfate bolus IVPB, metoprolol tartrate, morphine injection, ondansetron, phenol, sodium chloride flush, traMADol   Vital Signs    Vitals:   07/21/21 0517 07/21/21 0605 07/21/21 0652 07/21/21 0800  BP: 93/69 97/72  97/78  Pulse:      Resp: 16 20 20 19   Temp:     98.6 F (37 C)  TempSrc:    Axillary  SpO2:    100%  Weight:      Height:        Intake/Output Summary (Last 24 hours) at 07/21/2021 0951 Last data filed at 07/21/2021 0848 Gross per 24 hour  Intake 100 ml  Output 750 ml  Net -650 ml   Last 3 Weights 07/21/2021 07/20/2021 07/19/2021  Weight (lbs) 106 lb 7.7 oz 103 lb 13.4 oz 103 lb 13.4 oz  Weight (kg) 48.3 kg 47.1 kg 47.1 kg      Telemetry    Sinus tachycardia/MAT with runs of SVT and AT noted, rate up to 150s noted over the past 24 hours  - Personally Reviewed  ECG    EKG this morning showed ST with ventricular rate of 116 bpm, TWI of inferolateral leads unchanged from prior EKGs - Personally Reviewed  Physical Exam   GEN: No acute distress.  Chronic ill appearing. Dehydrated. Oral cavity dry.  Neck: No JVD Cardiac: Irregularly, tachycardiac, no murmurs, rubs, or gallops.  Respiratory: Clear to auscultation bilaterally, diminished at base, on room air.  GI: soft, BS+, DHT in place.  MS: s/p BKA, no bleeding from incision noted. Neuro:  Confused, able to follow one step command,  not able to elaborate history   Psych: calm and intermittent agitation and restlessness    Labs    High Sensitivity Troponin:   Recent Labs  Lab 07/05/21 2052 07/05/21 2253 07/06/21 0202 07/06/21 0631  TROPONINIHS 126* 192* 265* 265*      Chemistry Recent Labs  Lab 07/17/21 0434 07/20/21 0546 07/21/21 0808  NA 137 142 144  K 4.6 4.2 4.3  CL 109 114* 111  CO2 22 22 25   GLUCOSE 92 111* 142*  BUN 27* 54* 61*  CREATININE 0.94 0.93 1.04  CALCIUM 8.6* 8.7* 8.9  PROT  --  6.4*  --   ALBUMIN  --  1.2*  --   AST  --  15  --   ALT  --  12  --   ALKPHOS  --  101  --   BILITOT  --  0.7  --   GFRNONAA >60 >60 >60  ANIONGAP 6 6 8      Hematology Recent Labs  Lab 07/18/21 0445 07/20/21 0546 07/21/21 0808  WBC 11.0* 10.2 10.1  RBC 3.06* 2.67* 2.67*  HGB 8.5* 7.4* 7.3*  HCT 27.4* 24.1* 24.3*  MCV 89.5 90.3 91.0  MCH 27.8 27.7  27.3  MCHC 31.0 30.7 30.0  RDW 15.7* 15.7* 15.9*  PLT 379 318 310    BNPNo results for input(s): BNP, PROBNP in the last 168 hours.   DDimer No results for input(s): DDIMER in the last 168 hours.   Radiology    09/19/21 EKG SITE RITE  Result Date: 07/20/2021 If Site Rite image not attached, placement could not be confirmed due to current cardiac rhythm.   Cardiac Studies   Echocardiogram 07/12/2021   1. Left ventricular ejection fraction, by estimation, is 35 to 40%. The  left ventricle has moderately decreased function. Left ventricular  endocardial border not optimally defined to evaluate regional wall motion.   2. Right ventricular systolic function is normal. The right ventricular  size is normal. There is normal pulmonary artery systolic pressure. The  estimated right ventricular systolic pressure is 32.8 mmHg.   3. The mitral valve is abnormal. Moderate mitral valve regurgitation.   4. Tricuspid valve regurgitation is mild to moderate.   5. The aortic valve is abnormal. There is moderate calcification of the  aortic valve. Aortic valve regurgitation is trivial. Mild aortic valve  sclerosis is present, with no evidence of aortic valve stenosis.   Echo from 07/06/21:  1. Left ventricular ejection fraction, by estimation, is 30 to 35%. The  left ventricle has moderately decreased function. The left ventricle  demonstrates global hypokinesis. The left ventricular internal cavity size  was mildly dilated. Left ventricular  diastolic parameters are consistent with Grade II diastolic dysfunction  (pseudonormalization).   2. Right ventricular systolic function is normal. The right ventricular  size is normal. Tricuspid regurgitation signal is inadequate for assessing  PA pressure.   3. Left atrial size was moderately dilated.   4. The mitral valve is normal in structure. No evidence of mitral valve  regurgitation. No evidence of mitral stenosis.   5. The aortic valve is tricuspid.  Aortic valve regurgitation is not  visualized. Mild aortic valve sclerosis is present, with no evidence of  aortic valve stenosis.   6. Aortic dilatation noted. There is mild dilatation of the aortic root,  measuring 37 mm.   7. The IVC was not visualized.   8. Technically difficult study with poor acoustic windows.  Patient Profile  75 y.o. male with PMH of type 2 DM, HTN, bilateral BKA, PAD with multiple vascular surgery, post-op right groin pseudomonal infection, anemia, systolic heart failure, who is admitted for s/p right BKA complicated by pseudomonal infection of the right groin, pseudoaneurysm, bleeding. Found to have new onset of systolic heart failure and MAT with runs of SVTs. Cardiology signed had off on 07/19/21, re-called today for tachycardia.   Assessment & Plan    Sinus tahycardia/MAT with runs of SVT/AT  - continue have similar tachyarrhythmia throughout this admission, rate up to 150s over the past 24 hours, asymptomatic, currently at 100-110s after receiving Coreg at 830AM today  - on Coreg 25mg  BID currently, given low BP, will switch to Metoprolol 25mg  TID for rate control   - add Mag level, keep K >4 and Mag >2  - transfuse for anemia as needed, Hgb slowly downtrend  - maintain adequate hydration, he appears dehydrated clinically /will pause lasix for 48 hours  (last dose today at 824 AM)  Acute systolic heart failure, new  -  Patient is here following left AKA and subsequent right BKA with multiple complications including pseudoaneurysm, bleeding, and postop wound infection with Pseudomonas - Post op difficulty weaning off ventilator due to pulmonary edema  - Last Echo from 07/12/21 showed EF 35-40%, difficult assess RWMA, RVSP 32.8 mmHg, moderate MR,  mild to mod TR - three-vessel coronary artery calcifications and aortic atherosclerosis on CT.  Suspicion is high for severe multivessel disease given history of severe PAD, hypertension, diabetes, and tobacco abuse.  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  - Ischemic evaluation is deferred at this time due to infection, comorbidity at this time - GDMT: BP low, switched Coreg to Metoprolol today, may transition to XL at DC; continue Entresto 24/26 BID and Farxiga 10mg  daily  - transition to PO Lasix 20mg  daily currently, weight 136 >132 >103>106, clinically dehydrated today, will pause Lasix for 48 hours  (last dose today at 824 AM)  CAD per non-coronary CT - CT as above  - continue medical therapy with ASA, Plavix, atorvastatin, and metoprolol as above - outpatient ischemic evaluation when acute illness resolves   Pseudomonal infection of the right groin Malnutrition Encephalopathy   PAD with bilateral BKA  Type 2 DM  Acute blood loss anemia  - managed per primary team      For questions or updates, please contact [facility] Please consult [email or web address] for contact info under        Signed, 09/11/21, NP  07/21/2021, 9:51 AM

## 2021-07-21 NOTE — Progress Notes (Addendum)
[facility] CONSULT PROGRESS NOTE    Progress Note  Todd Duran  HKV:[id number] DOB: November 19, 1945 DOA: 06/27/2021 PCP: Pcp, No     Brief Narrative:   Todd Duran is an 75 y.o. male with past medical history of diabetes mellitus type 2, essential hypertension, bilateral BKA , on 05/10/2021 patient received left BKA, came back on 06/05/2021 for right toe amputation right and femoral to popliteal artery bypass grafting which was done on 06/10/2021.  On 06/20/2021 patient presented with an infected right foot and on 06/21/2021 had a right BKA.  On 06/23/2021 was sent to CIR, 06/27/2021 vascular surgery was consulted for for bleeding from the groin site and a dropping hemoglobin to 6.9.  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  On 06/30/2021 revision of the right femoral retroperitoneal and right iliac artery exposure, with a redo of the right femoral artery with endarterectomy and placement of multiple Blodgett sutures for hemostasis.  Postop culture grew Pseudomonas, which was treated with IV cefepime.  On 07/03/2021 he had an acute bleeding from his right groin with wound VAC output greater than 500 mL.  Right groin exploration and evacuation was required of the right groin hematoma.  On 07/04/2021 patient was taken back to the OR for reexploration of the right groin with ligation of the right femoropopliteal vein bypass and ligation of the right external iliac artery, excision of the right bovine pericardial patch and excision of right common femoral artery and negative pressure drain to the right groin.  Admitted to the ICU requiring mechanical ventilation subsequently extubated however he developed fluid overload for which she required BiPAP for few days.  Cardiology was consulted as he appears to be newly diagnosed with an EF of 30% and global hypokinesia.  There was a mild elevation in troponins thought to be demand ischemia.Patient was placed on IV diuretics with good response and has been weaned to room air.   Significant events: 8/27: right groin re-exploration and evacuation of hematoma, very friable vessels, ultimately opted for ligation.  8/28 more comfortable. No signs of bleeding.  But ended up on BIPAP; 2/2 edema seemed to respond to edema  8/29 adding back home meds. Palliative consulted by primary team.  Echocardiogram: Ejection fraction 30 to 35%, global hypokinesis, LV mildly dilated.  Diastolic parameters consistent with grade 2 diastolic dysfunction.  RV size normal, RV function normal.  Lactate elevated at 4 8/30: Quiet evening.  Blood pressure under control.  Lactate cleared to 0.7 bedside and assessment of lower limb, seemed a little bit warmer on exam.  No distress. Overnight patient got SOB. Pulm edema given lasix and placed on bipap 8/31: patient got sob with pulm edema. Placed on bipap for a few hours and lasix given. 9/6: Plan is for revision of the right BKA    Assessment/Plan:   Acute hypercapnic respiratory failure (HCC): Most likely due to pulmonary edema in the setting of systolic heart failure requiring BiPAP for a few days was aggressively diuresed. Patient has been weaned to room air. Extremely poor prognosis.   Peripheral artery disease status post recent right below the knee amputation: He has been transfused several units of packed red blood cells he remains on aspirin and Plavix. Vascular surgery recommended an above-the-knee amputation with debridement of the right groin and wound VAC placement on 07/14/2021. Continue further management per vascular surgery. He develop hypotension and  afib, vanc was added blood cultures where sent. PMT re consulted to address goals of care as the patient has a poor prognosis.  Acute Systolic heart failure:  He remains on aspirin, Plavix, statins, Entresto and Coreg.   Cardiology has signed off. Free T4 1.2 recommend recheck in 4 to 6 weeks TSH and free T4. Due to new heart failure and EKG changes cardiology was consulted they recommended to continue conservative management as no invasive procedures planned at this time due to his other acute problems and multiple co-morbid conditions and the patient is having no anginal symptoms. Continue Lasix.  Persistence MAT: Now only with mild sinus tachycardia. Cardiology will sign off.  Essential hypertension/hyponatremia: Blood pressure seems to be reasonably controlled hyponatremia is resolved.  Pseudomonal infection of the right groin area: He was started on cefepime on 07/02/2021 plan for 6 weeks of IV empiric antibiotics. Surveillance blood cultures remain negative till date. PICC line placed on 07/05/2021.  Acute blood loss anemia: His hemoglobin this morning 6.7 status post 2 units of packed red Blood cells his hemoglobin this morning is 8.6. His hemoglobin is slowly drifting down, he is currently on aspirin and Plavix. Will FOBT stools.  Also in the differential will be slow loss to the wound VAC.  Diabetes mellitus type 2: With an A1c of 5.2 in July continue sliding scale insulin.  Goals of care: Seen by palliative care patient is a DNR. Will probably need to get palliative care involved to discuss end-of-life as the patient seems not to be improving.  Moderate protein caloric malnutrition: Noted. Continue tube feeding.  Core track.,  I have after 5 days no improvement in his oral intake might need to consider a PEG tube.  Which she is probably not a good candidate due to his multiple comorbidities and poor functional status.  Defer decision to vascular surgery.  Stage II sacral decubitus ulcer RN Pressure Injury Documentation: Pressure Injury 06/05/21 Coccyx Mid;Other (Comment) Stage 2 -  Partial thickness loss of dermis presenting as a shallow open injury with a red, pink wound bed without  slough. (Active)  06/05/21 2200  Location: Coccyx  Location Orientation: Mid;Other (Comment)  Staging: Stage 2 -  Partial thickness loss of dermis presenting as a shallow open injury with a red, pink wound bed without slough.  Wound Description (Comments):   Present on Admission: Yes    DVT prophylaxis: PLavix and asa Family Communication:none Status is: Inpatient  Remains inpatient appropriate because:Hemodynamically unstable  Dispo: The patient is from: Home              Anticipated d/c is to: SNF              Patient currently is not medically stable to d/c.   Difficult to place patient No   Code Status:     Code Status Orders  (From admission, onward)           Start     Ordered   07/07/21 1408  Do not attempt resuscitation (DNR)  Continuous       Question Answer Comment  In the event of cardiac or respiratory ARREST Do not call a "code blue"   In the event of cardiac or respiratory ARREST Do not perform Intubation, CPR, defibrillation or ACLS   In the event of cardiac or respiratory ARREST Use medication by any route, position, wound care, and other measures to relive pain and suffering. May use oxygen, suction and manual treatment of airway obstruction  as needed for comfort.      07/07/21 1407           Code Status History     Date Active Date Inactive Code Status Order ID Comments User Context   06/27/2021 2321 07/07/2021 1407 Full Code [id number]  Dory Horn Inpatient   06/23/2021 1737 06/27/2021 1746 Full Code [id number]  Charlton Amor, PA-C Inpatient   06/23/2021 1737 06/23/2021 1737 Full Code [id number]  Charlton Amor, PA-C Inpatient   06/05/2021 2201 06/23/2021 1730 Full Code [id number]  Chuck Hint, MD Inpatient   06/05/2021 2201 06/05/2021 2201 Full Code [id number]  Purnell Shoemaker, RN Inpatient   05/25/2021 1513 06/05/2021 2013 Full Code [id number]  Charlton Amor, PA-C Inpatient   05/25/2021 1513 05/25/2021 1513 Full Code  [id number]  Charlton Amor, PA-C Inpatient   05/20/2021 1820 05/25/2021 1505 Full Code [id number]  Dara Lords, PA-C Inpatient   05/15/2021 1707 05/20/2021 1428 Full Code [id number]  Charlton Amor, PA-C Inpatient   05/15/2021 1707 05/15/2021 1707 Full Code [id number]  Charlton Amor, PA-C Inpatient   05/12/2021 1502 05/15/2021 1706 Full Code [id number]  Nada Libman, MD Inpatient         IV Access:   Peripheral IV   Procedures and diagnostic studies:   [place] EKG SITE RITE  Result Date: 07/20/2021 If Site Rite image not attached, placement could not be confirmed due to current cardiac rhythm.    Medical Consultants:   None.   Subjective:    Todd Duran no complaints  Objective:    Vitals:   07/21/21 0517 07/21/21 0605 07/21/21 0652 07/21/21 0800  BP: 93/69 97/72  97/78  Pulse:      Resp: 16 20 20 19   Temp:    98.6 F (37 C)  TempSrc:    Axillary  SpO2:    100%  Weight:      Height:       SpO2: 100 % O2 Flow Rate (L/min): 2 L/min FiO2 (%): 40 %   Intake/Output Summary (Last 24 hours) at 07/21/2021 0916 Last data filed at 07/21/2021 0848 Gross per 24 hour  Intake 100 ml  Output 750 ml  Net -650 ml    Filed Weights   07/19/21 0453 07/20/21 0408 07/21/21 0329  Weight: 47.1 kg 47.1 kg 48.3 kg    Exam: General exam: In no acute distress. Respiratory system: Good air movement and clear to auscultation. Cardiovascular system: S1 & S2 heard, RRR. No JVD. Gastrointestinal system: Abdomen is nondistended, soft and nontender.  Psychiatry: Judgement and insight appear normal. Mood & affect appropriate. Extremities: Bilateral below the knee amputation with a right wound VAC in place   Data Reviewed:    Labs: Basic Metabolic Panel: Recent Labs  Lab 07/15/21 0400 07/16/21 1100 07/17/21 0434 07/17/21 2105 07/18/21 0445 07/18/21 1700 07/19/21 0542 07/19/21 1638 07/20/21 0546 07/21/21 0808  NA 135 136 137  --   --   --   --   --  142 144  K  3.7 2.9* 4.6  --   --   --   --   --  4.2 4.3  CL 103 108 109  --   --   --   --   --  114* 111  CO2 24 22 22   --   --   --   --   --  22 25  GLUCOSE 88 135* 92  --   --   --   --   --  111* 142*  BUN 22 19 27*  --   --   --   --   --  54* 61*  CREATININE 0.80 0.69 0.94  --   --   --   --   --  0.93 1.04  CALCIUM 8.9 7.3* 8.6*  --   --   --   --   --  8.7* 8.9  MG  --   --   --    < > 1.8 2.0 1.8 2.0 2.0  --   PHOS  --   --   --    < > 3.7 3.2 2.7 2.7 3.2  --    < > = values in this interval not displayed.    GFR Estimated Creatinine Clearance: 43.2 mL/min (by C-G formula based on SCr of 1.04 mg/dL). Liver Function Tests: Recent Labs  Lab 07/20/21 0546  AST 15  ALT 12  ALKPHOS 101  BILITOT 0.7  PROT 6.4*  ALBUMIN 1.2*   No results for input(s): LIPASE, AMYLASE in the last 168 hours. No results for input(s): AMMONIA in the last 168 hours. Coagulation profile No results for input(s): INR, PROTIME in the last 168 hours. COVID-19 Labs  No results for input(s): DDIMER, FERRITIN, LDH, CRP in the last 72 hours.  Lab Results  Component Value Date   SARSCOV2NAA NEGATIVE 05/12/2021   SARSCOV2NAA NEGATIVE 05/08/2021   SARSCOV2NAA Not Detected 11/29/2019    CBC: Recent Labs  Lab 07/16/21 0356 07/17/21 0434 07/18/21 0445 07/20/21 0546 07/21/21 0808  WBC 11.4* 12.9* 11.0* 10.2 10.1  NEUTROABS  --   --   --  8.0*  --   HGB 9.5* 8.6* 8.5* 7.4* 7.3*  HCT 29.1* 28.1* 27.4* 24.1* 24.3*  MCV 86.9 89.8 89.5 90.3 91.0  PLT 361 374 379 318 310    Cardiac Enzymes: No results for input(s): CKTOTAL, CKMB, CKMBINDEX, TROPONINI in the last 168 hours. BNP (last 3 results) No results for input(s): PROBNP in the last 8760 hours. CBG: Recent Labs  Lab 07/20/21 1656 07/20/21 2037 07/21/21 0005 07/21/21 0415 07/21/21 0439  GLUCAP 92 123* 163* 49* 103*    D-Dimer: No results for input(s): DDIMER in the last 72 hours. Hgb A1c: Recent Labs    07/21/21 0808  HGBA1C 5.4    Lipid Profile: No results for input(s): CHOL, HDL, LDLCALC, TRIG, CHOLHDL, LDLDIRECT in the last 72 hours. Thyroid function studies: No results for input(s): TSH, T4TOTAL, T3FREE, THYROIDAB in the last 72 hours.  Invalid input(s): FREET3  Anemia work up: No results for input(s): VITAMINB12, FOLATE, FERRITIN, TIBC, IRON, RETICCTPCT in the last 72 hours. Sepsis Labs: Recent Labs  Lab 07/17/21 0434 07/18/21 0445 07/20/21 0546 07/21/21 0808  WBC 12.9* 11.0* 10.2 10.1    Microbiology No results found for this or any previous visit (from the past 240 hour(s)).    Medications:    aspirin  81 mg Per Tube Daily   atorvastatin  40 mg Per Tube QHS   carvedilol  25 mg Per Tube BID WC   Chlorhexidine Gluconate Cloth  6 each Topical Q0600   clopidogrel  75 mg Per Tube Daily   dapagliflozin propanediol  10 mg Oral Daily   docusate  100 mg Per Tube BID   feeding supplement  237 mL Oral TID BM   feeding supplement (PROSource TF)  45 mL Per Tube TID   furosemide  20 mg Oral Daily   gabapentin  100 mg Oral TID  heparin injection (subcutaneous)  5,000 Units Subcutaneous Q8H   insulin aspart  0-9 Units Subcutaneous TID WC   insulin detemir  10 Units Subcutaneous BID   mouth rinse  15 mL Mouth Rinse BID   methocarbamol  750 mg Per Tube QID   multivitamin with minerals  1 tablet Per Tube Daily   pantoprazole sodium  40 mg Per Tube Daily   polyethylene glycol  17 g Per Tube Daily   sacubitril-valsartan  1 tablet Per Tube BID   sennosides  10 mL Per Tube QHS   sodium chloride flush  10-40 mL Intracatheter Q12H   Continuous Infusions:  sodium chloride 250 mL (07/15/21 0048)   ceFEPime (MAXIPIME) IV 2 g (07/21/21 0053)   feeding supplement (OSMOLITE 1.5 CAL) 1,000 mL (07/20/21 2124)   magnesium sulfate bolus IVPB        LOS: 24 days   Marinda Elk  [facility]  07/21/2021, 9:16 AM

## 2021-07-21 NOTE — Progress Notes (Signed)
Pharmacy Antibiotic Note  Todd Duran is a 75 y.o. male admitted on 06/27/2021 groin site infection with post-op Cx growing pseudomonas and currently on prolonged cefepime tx, little improvement and now hypotensive in afib, tx to broaden coverage.  Pharmacy has been consulted for vancomycin dosing.  Plan: Vancomycin 1000 mg IV x 1, then 750 mg IV q 24h (eAUC 535, Goal AUC 400-550, SCr 1.04) Continue Cefepime 2g IV q 12h Monitor renal function, Cx and clinical progression to narrow Vancomycin levels as indicated  Height: 5\' 11"  (180.3 cm) Weight: 48.3 kg (106 lb 7.7 oz) IBW/kg (Calculated) : 75.3  Temp (24hrs), Avg:98.1 F (36.7 C), Min:97.5 F (36.4 C), Max:98.6 F (37 C)  Recent Labs  Lab 07/15/21 0400 07/15/21 1800 07/16/21 0356 07/16/21 1100 07/17/21 0434 07/18/21 0445 07/20/21 0546 07/21/21 0808  WBC 11.8*   < > 11.4*  --  12.9* 11.0* 10.2 10.1  CREATININE 0.80  --   --  0.69 0.94  --  0.93 1.04   < > = values in this interval not displayed.    Estimated Creatinine Clearance: 43.2 mL/min (by C-G formula based on SCr of 1.04 mg/dL).    Allergies  Allergen Reactions   Lisinopril Swelling    Swelling of lips    Cefepime 8/25> (10/9)  9/13 BCx: sent 9/13 UCx: sent 8/23 wound (operative cx): rare Pseudomonas (pan-sensitive)  9/23, PharmD Clinical Pharmacist ED Pharmacist Phone # [phone] 07/21/2021 1:35 PM

## 2021-07-21 NOTE — Progress Notes (Signed)
Nutrition Follow-up  DOCUMENTATION CODES:   Non-severe (moderate) malnutrition in context of chronic illness  INTERVENTION:   Continue tube feeding:  -Osmolite 1.5 @ 50 ml/hr via Cortrak (1200 ml/day) -ProSource TF 45 ml TID  Tube feeding regimen at goal provides 1920 kcal, 108 grams of protein, and 914 ml of H2O.  NUTRITION DIAGNOSIS:   Moderate Malnutrition related to chronic illness (PVD/PAD) as evidenced by moderate fat depletion, severe muscle depletion.  Ongoing  GOAL:   Patient will meet greater than or equal to 90% of their needs  Addressed via TF  MONITOR:   PO intake, Supplement acceptance, Labs, Weight trends, TF tolerance, Skin, I & O's  REASON FOR ASSESSMENT:   Consult Assessment of nutrition requirement/status  ASSESSMENT:   Patient with PMH significant for PAD, L BKA, PVD, DM, and HTN. Presents this admission with large pseudoaneurysm of the R groin.  8/14 - s/p R BKA 8/20 - s/p retroperitoneal exposure of R iliac artery, exploration of R groin, repair of bovine pericardial patch angioplasty 8/23 - s/p redo R femoral artery exposure, revision of R femoral endarterectomy, R femoral wound VAC 8/27 - s/p R groin exploration with common femoral repair, evacuation of R groin hematoma, reexploration later in the day 8/28 - extubated  9/06 - s/p R AKA, wound VAC on R thigh, groin debridement 9/09 - Cortrak placed, feedings started  Patient remains confused. RN attempted to feed patient for breakfast but patient unable to initiate swallow and asked to have towel to spit food into. SLP saw patient shortly after and recommended NPO status until mentation improves. Patient currently tolerating Osmolite 1.5 at goal rate. Having Bms. Continue to meet 100% with tube feeding until patient demonstrates improved and consistent oral intake.   PMT to have ongoing GOC conversations.   Admit weight: 57.5 kg Current weight: 48.3 kg  UOP:  750 ml x 24 hours Wound VAC: 0  ml x 24 hours  Medications: colace, SS novolog, levemir, miralax, senokot Labs: CBG [id number]  Diet Order:   Diet Order             Diet regular Room service appropriate? No; Fluid consistency: Thin  Diet effective now                   EDUCATION NEEDS:   Not appropriate for education at this time  Skin:  Skin Assessment: Skin Integrity Issues: Stage II: coccyx Wound VAC: R thigh, R groin Incisions: R leg  Last BM:  9/12  Height:   Ht Readings from Last 1 Encounters:  07/14/21 5\' 11"  (1.803 m)    Weight:   Wt Readings from Last 1 Encounters:  07/21/21 48.3 kg    BMI:  Body mass index is 14.85 kg/m.  Estimated Nutritional Needs:   Kcal:  1800-2000 kcal  Protein:  100-120 grams  Fluid:  >/= 1.8 L/day  07/23/21 MS, RD, LDN, CNSC Clinical Nutrition Pager listed in AMION

## 2021-07-21 NOTE — Evaluation (Signed)
Clinical/Bedside Swallow Evaluation Patient Details  Name: Todd Duran MRN: [id number] Date of Birth: 23-Dec-1945  Today's Date: 07/21/2021 Time: SLP Start Time (ACUTE ONLY): 1200 SLP Stop Time (ACUTE ONLY): 1225 SLP Time Calculation (min) (ACUTE ONLY): 25 min  Past Medical History:  Past Medical History:  Diagnosis Date   Diabetes mellitus without complication (HCC)    Hypertension    Past Surgical History:  Past Surgical History:  Procedure Laterality Date   ABDOMINAL AORTOGRAM W/LOWER EXTREMITY N/A 05/12/2021   Procedure: ABDOMINAL AORTOGRAM W/LOWER EXTREMITY;  Surgeon: Nada Libman, MD;  Location: [facility];  Service: Cardiovascular;  Laterality: N/A;   AMPUTATION Left 05/13/2021   Procedure: AMPUTATION BELOW KNEE LEFT ;  Surgeon: Nada Libman, MD;  Location: [facility] OR;  Service: Vascular;  Laterality: Left;   AMPUTATION Left 05/20/2021   Procedure: AMPUTATION ABOVE KNEE;  Surgeon: Cephus Shelling, MD;  Location: [facility] OR;  Service: Vascular;  Laterality: Left;   AMPUTATION Right 06/05/2021   Procedure: RIGHT FIFTH TOE AMPUTATION;  Surgeon: Nada Libman, MD;  Location: [facility] OR;  Service: Vascular;  Laterality: Right;   AMPUTATION Right 06/10/2021   Procedure: RIGHT THIRD AND  FOURTH TOE AMPUTATION;  Surgeon: Nada Libman, MD;  Location: [facility] OR;  Service: Vascular;  Laterality: Right;   AMPUTATION Right 06/21/2021   Procedure: RIGHT BELOW KNEE AMPUTATION;  Surgeon: Nada Libman, MD;  Location: [facility] OR;  Service: Vascular;  Laterality: Right;   AMPUTATION Right 07/14/2021   Procedure: AMPUTATION ABOVE KNEE;  Surgeon: Chuck Hint, MD;  Location: [facility] OR;  Service: Vascular;  Laterality: Right;   APPLICATION OF WOUND VAC Right 06/30/2021   Procedure: APPLICATION OF PREVENA WOUND VAC;  Surgeon: Nada Libman, MD;  Location: [facility] OR;  Service: Vascular;  Laterality: Right;   APPLICATION OF WOUND VAC Right 07/14/2021   Procedure: APPLICATION OF WOUND VAC;  Surgeon:  Chuck Hint, MD;  Location: [facility] OR;  Service: Vascular;  Laterality: Right;   ENDARTERECTOMY FEMORAL Right 06/05/2021   Procedure: RIGHT FEMORAL ENDARTERECTOMY;  Surgeon: Nada Libman, MD;  Location: [facility] OR;  Service: Vascular;  Laterality: Right;   FEMORAL ARTERY EXPLORATION Right 07/03/2021   Procedure: FEMORAL ARTERY EXPLORATION WITH COMMON ARTERY REPAIR, evacuation of hematoma. ;  Surgeon: Cephus Shelling, MD;  Location: [facility] OR;  Service: Vascular;  Laterality: Right;   FEMORAL ARTERY EXPLORATION Right 07/04/2021   Procedure: FEMORAL ARTERY RE- EXPLORATION AND Ligation of Right Fermoral/Popliteal Bypass and Right External Iliac Artery and Excision right Bovine patch and common Femoral Artery.;  Surgeon: Cephus Shelling, MD;  Location: [facility] OR;  Service: Vascular;  Laterality: Right;   FEMORAL-POPLITEAL BYPASS GRAFT Right 06/10/2021   Procedure: RIGHT FEMORAL TO ABOVE KNEE POPLITEAL ARTERY BYPASS GRAFTING USING RIGHT NON REVERSED GREATER SAPHENOUS VEIN;  Surgeon: Nada Libman, MD;  Location: [facility] OR;  Service: Vascular;  Laterality: Right;   GROIN DEBRIDEMENT Right 07/14/2021   Procedure: Drucie Ip DEBRIDEMENT;  Surgeon: Chuck Hint, MD;  Location: [facility] OR;  Service: Vascular;  Laterality: Right;   HERNIA REPAIR     INSERTION OF ILIAC STENT Right 06/05/2021   Procedure: INSERTION OF RIGHT ILIAC AND SUPERFICIAL FEMORAL ARTERY STENT;  Surgeon: Nada Libman, MD;  Location: [facility] OR;  Service: Vascular;  Laterality: Right;   KNEE SURGERY     PATCH ANGIOPLASTY Right 06/05/2021   Procedure: PATCH ANGIOPLASTY OF RIGHT FEMORAL ARTERY USING XENOSURE BIOLOGIC PATCH;  Surgeon: Nada Libman,  MD;  Location: [facility] OR;  Service: Vascular;  Laterality: Right;   REPAIR ILIAC ARTERY Right 06/27/2021   Procedure: RETROPERITONEAL EXPOSURE AND REPAIR EXTERNAL ILIAC ARTERY;  Surgeon: Chuck Hint, MD;  Location: [facility] OR;  Service: Vascular;  Laterality: Right;   VEIN HARVEST Right 06/10/2021    Procedure: VEIN HARVEST OF RIGHT GREATER SAPHENOUS VEIN;  Surgeon: Nada Libman, MD;  Location: [facility] OR;  Service: Vascular;  Laterality: Right;   HPI:  74yo male admitted 7/29 from CIR due to R lower limb ischemia. Pt underwent R femoral endarterectomy, R iliac & superficial fem art stent, R 5th toe amp. 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 Transferred to Rehab on 8/16.  Back to Acute on 8/20 due to pseudoaneurysm with exploration. Continued active bleeding on 8/21 PMH: DM2, essential HTN, B BKA, tobacco and alcohol abuse   Assessment / Plan / Recommendation Clinical Impression  Pt seen at bedside for assessment of swallow function and safety. Wife in attendance. Oral care was completed. Pt presents with missing dentition. Unable to follow commands for CN exam, but no obvious asymmetry was noted. Pt accepted ice chips x3. Discoordinated oral manipulation and suspected delay of swallow reflex. Minimal laryngeal elevation on palpation. Pt was unable to follow commands, and was minimally verbal - pt said "hello" only after max cues. Voice quality clear (not wet).   Recommend strict NPO status with use of cortrak for nutrition, hydration, and meds until mentation improves to the point that consideration of PO intake is more appropriate. RN and team informed. ST will continue to follow.  SLP Visit Diagnosis: Dysphagia, unspecified (R13.10)    Aspiration Risk  Severe aspiration risk;Risk for inadequate nutrition/hydration    Diet Recommendation NPO   Medication Administration: Via alternative means    Other  Recommendations Oral Care Recommendations: Oral care QID Other Recommendations: Have oral suction available   Follow up Recommendations Other (comment) (TBD)      Frequency and Duration min 1 x/week  1 week;2 weeks       Prognosis Prognosis for Safe Diet Advancement:  Fair Barriers to Reach Goals: Cognitive deficits      Swallow Study   General Date of Onset: 06/27/21 HPI: 74yo male admitted 7/29 from CIR due to R lower limb ischemia. Pt underwent R femoral endarterectomy, R iliac & superficial fem art stent, R 5th toe amp. 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 Transferred to Rehab on 8/16.  Back to Acute on 8/20 due to pseudoaneurysm with exploration. Continued active bleeding on 8/21 PMH: DM2, essential HTN, B BKA, tobacco and alcohol abuse Type of Study: Bedside Swallow Evaluation Previous Swallow Assessment: none Diet Prior to this Study: Regular;Thin liquids;Other (Comment);NG Tube (also has cortrak) Temperature Spikes Noted: No Respiratory Status: Room air History of Recent Intubation: No    Oral/Motor/Sensory Function Overall Oral Motor/Sensory Function: Generalized oral weakness   Ice Chips Ice chips: Impaired Oral Phase Impairments: Reduced labial seal;Reduced lingual movement/coordination;Impaired mastication;Poor awareness of bolus Pharyngeal Phase Impairments: Suspected delayed Swallow   Thin Liquid Thin Liquid: Not tested    Nectar Thick Nectar Thick Liquid: Not tested   Honey Thick Honey Thick Liquid: Not tested   Puree Puree: Not tested   Solid     Solid: Not tested  Janielle Mittelstadt B. Murvin Natal, [facility], CCC-SLP Speech Language Pathologist Office: [phone]  Leigh Aurora 07/21/2021,12:35 PM

## 2021-07-22 LAB — GLUCOSE, CAPILLARY
Glucose-Capillary: 131 mg/dL — ABNORMAL HIGH (ref 70–99)
Glucose-Capillary: 138 mg/dL — ABNORMAL HIGH (ref 70–99)
Glucose-Capillary: 148 mg/dL — ABNORMAL HIGH (ref 70–99)
Glucose-Capillary: 151 mg/dL — ABNORMAL HIGH (ref 70–99)
Glucose-Capillary: 165 mg/dL — ABNORMAL HIGH (ref 70–99)
Glucose-Capillary: 173 mg/dL — ABNORMAL HIGH (ref 70–99)
Glucose-Capillary: 195 mg/dL — ABNORMAL HIGH (ref 70–99)

## 2021-07-22 LAB — URINE CULTURE: Culture: 80000 — AB

## 2021-07-22 MED ORDER — FUROSEMIDE 40 MG PO TABS
40.0000 mg | ORAL_TABLET | Freq: Once | ORAL | Status: AC
  Administered 2021-07-22: 40 mg via ORAL
  Filled 2021-07-22: qty 1

## 2021-07-22 NOTE — Progress Notes (Signed)
Cardiology Progress Note  Patient ID: Todd Duran MRN: [id number] DOB: 1946-05-03 Date of Encounter: 07/22/2021  Primary Cardiologist: Parke Poisson, MD  Subjective   Chief Complaint: None.  HPI: Back in sinus rhythm.  Confused this morning.  Not as agitated as yesterday.  Blood pressure is better.  Denies any symptoms.  ROS:  All other ROS reviewed and negative. Pertinent positives noted in the HPI.     Inpatient Medications  Scheduled Meds:  aspirin  81 mg Per Tube Daily   atorvastatin  40 mg Per Tube QHS   Chlorhexidine Gluconate Cloth  6 each Topical Q0600   clopidogrel  75 mg Per Tube Daily   dapagliflozin propanediol  10 mg Oral Daily   digoxin  0.125 mg Oral Daily   docusate  100 mg Per Tube BID   feeding supplement (PROSource TF)  45 mL Per Tube TID   furosemide  40 mg Oral Once   gabapentin  100 mg Oral TID   heparin injection (subcutaneous)  5,000 Units Subcutaneous Q8H   insulin aspart  0-9 Units Subcutaneous TID WC   insulin detemir  10 Units Subcutaneous BID   mouth rinse  15 mL Mouth Rinse BID   methocarbamol  750 mg Per Tube QID   multivitamin with minerals  1 tablet Per Tube Daily   pantoprazole sodium  40 mg Per Tube Daily   polyethylene glycol  17 g Per Tube Daily   sennosides  10 mL Per Tube QHS   sodium chloride flush  10-40 mL Intracatheter Q12H   Continuous Infusions:  sodium chloride 250 mL (07/15/21 0048)   amiodarone 30 mg/hr (07/22/21 0555)   ceFEPime (MAXIPIME) IV 2 g (07/22/21 0028)   feeding supplement (OSMOLITE 1.5 CAL) 1,000 mL (07/21/21 1947)   magnesium sulfate bolus IVPB     vancomycin     PRN Meds: sodium chloride, acetaminophen **OR** acetaminophen, alum & mag hydroxide-simeth, magnesium sulfate bolus IVPB, morphine injection, ondansetron, phenol, sodium chloride flush, traMADol   Vital Signs   Vitals:   07/22/21 0200 07/22/21 0300 07/22/21 0404 07/22/21 0600  BP:   92/60 (!) 95/55  Pulse:   94 84  Resp: (!) 22 20 (!)  22 20  Temp:   99.2 F (37.3 C) 98.3 F (36.8 C)  TempSrc:   Oral Oral  SpO2:   98% 100%  Weight:      Height:        Intake/Output Summary (Last 24 hours) at 07/22/2021 0850 Last data filed at 07/22/2021 0759 Gross per 24 hour  Intake 2721.88 ml  Output 701 ml  Net 2020.88 ml   Last 3 Weights 07/21/2021 07/20/2021 07/19/2021  Weight (lbs) 106 lb 7.7 oz 103 lb 13.4 oz 103 lb 13.4 oz  Weight (kg) 48.3 kg 47.1 kg 47.1 kg      Telemetry  Overnight telemetry shows sinus rhythm in the 80s, which I personally reviewed.   ECG  The most recent ECG shows sinus rhythm heart rate 97, marked anterolateral T wave inversions, which I personally reviewed.   Physical Exam   Vitals:   07/22/21 0200 07/22/21 0300 07/22/21 0404 07/22/21 0600  BP:   92/60 (!) 95/55  Pulse:   94 84  Resp: (!) 22 20 (!) 22 20  Temp:   99.2 F (37.3 C) 98.3 F (36.8 C)  TempSrc:   Oral Oral  SpO2:   98% 100%  Weight:      Height:  Intake/Output Summary (Last 24 hours) at 07/22/2021 0850 Last data filed at 07/22/2021 0759 Gross per 24 hour  Intake 2721.88 ml  Output 701 ml  Net 2020.88 ml    Last 3 Weights 07/21/2021 07/20/2021 07/19/2021  Weight (lbs) 106 lb 7.7 oz 103 lb 13.4 oz 103 lb 13.4 oz  Weight (kg) 48.3 kg 47.1 kg 47.1 kg    Body mass index is 14.85 kg/m.   General: Ill-appearing Head: Atraumatic, normal size  Eyes: PEERLA, EOMI  Neck: Supple, JVD 7 8 cm of water Endocrine: No thryomegaly Cardiac: Normal S1, S2; RRR; no murmurs, rubs, or gallops Lungs: Clear to auscultation bilaterally, no wheezing, rhonchi or rales  Abd: Soft, nontender, no hepatomegaly  Ext: Status post right AKA, status post left BKA Musculoskeletal: Right AKA status post left BKA Skin: Warm and dry, no rashes   Neuro: Alert, awake, oriented to person and place only  Labs  High Sensitivity Troponin:   Recent Labs  Lab 07/05/21 2052 07/05/21 2253 07/06/21 0202 07/06/21 0631  TROPONINIHS 126* 192* 265* 265*      Cardiac EnzymesNo results for input(s): TROPONINI in the last 168 hours. No results for input(s): TROPIPOC in the last 168 hours.  Chemistry Recent Labs  Lab 07/17/21 0434 07/20/21 0546 07/21/21 0808  NA 137 142 144  K 4.6 4.2 4.3  CL 109 114* 111  CO2 22 22 25   GLUCOSE 92 111* 142*  BUN 27* 54* 61*  CREATININE 0.94 0.93 1.04  CALCIUM 8.6* 8.7* 8.9  PROT  --  6.4*  --   ALBUMIN  --  1.2*  --   AST  --  15  --   ALT  --  12  --   ALKPHOS  --  101  --   BILITOT  --  0.7  --   GFRNONAA >60 >60 >60  ANIONGAP 6 6 8     Hematology Recent Labs  Lab 07/18/21 0445 07/20/21 0546 07/21/21 0808  WBC 11.0* 10.2 10.1  RBC 3.06* 2.67* 2.67*  HGB 8.5* 7.4* 7.3*  HCT 27.4* 24.1* 24.3*  MCV 89.5 90.3 91.0  MCH 27.8 27.7 27.3  MCHC 31.0 30.7 30.0  RDW 15.7* 15.7* 15.9*  PLT 379 318 310   BNPNo results for input(s): BNP, PROBNP in the last 168 hours.  DDimer No results for input(s): DDIMER in the last 168 hours.   Radiology  DG CHEST PORT 1 VIEW  Result Date: 07/21/2021 CLINICAL DATA:  Altered mental status. EXAM: PORTABLE CHEST 1 VIEW COMPARISON:  07/09/2021 FINDINGS: Single-view of the chest demonstrates clear lungs. Heart size is normal. There is a feeding tube that extends into the abdomen but the tip is beyond the image. Negative for a pneumothorax. Bone structures are unremarkable. Right arm PICC line with the tip in the upper SVC region. IMPRESSION: 1. No acute cardiopulmonary disease. 2. Support apparatuses as described. Electronically Signed   By: 07/23/2021 M.D.   On: 07/21/2021 10:23    Cardiac Studies  TTE 07/12/2021  1. Left ventricular ejection fraction, by estimation, is 35 to 40%. The  left ventricle has moderately decreased function. Left ventricular  endocardial border not optimally defined to evaluate regional wall motion.   2. Right ventricular systolic function is normal. The right ventricular  size is normal. There is normal pulmonary artery systolic  pressure. The  estimated right ventricular systolic pressure is 32.8 mmHg.   3. The mitral valve is abnormal. Moderate mitral valve regurgitation.   4. Tricuspid  valve regurgitation is mild to moderate.   5. The aortic valve is abnormal. There is moderate calcification of the  aortic valve. Aortic valve regurgitation is trivial. Mild aortic valve  sclerosis is present, with no evidence of aortic valve stenosis.   Patient Profile  75 year old male with history of diabetes, hypertension, bilateral BKA, right femoral pseudoaneurysm, postop groin infection, anemia who was admitted initially for complications related to right BKA.  Cardiology was consulted for new onset systolic heart failure and atrial arrhythmias.  Cardiology has been reconsulted on 07/21/2021 for A. fib with RVR as well as atrial flutter.  Assessment & Plan   #New onset atrial fibrillation #Atrial flutter -This was a new rhythm for him on 07/21/2021.  Appeared to have been in and out of atrial fibrillation and flutter.  He is also had multifocal atrial tachycardia. -Due to his hypotension yesterday he was started on IV amiodarone.  He is maintaining sinus rhythm.  We will complete 24 hours of IV amiodarone therapy. -We will likely transition to oral amiodarone tomorrow. -Digoxin 0.125 mg daily was added for his hypotension with improvement. -Infectious work-up per hospital medicine. -Blood cultures negative.  Lactic acid was elevated but this has decreased to normal. -He is still quite confused. -Not a great candidate for anticoagulation.  Hemoglobin is continue to trend down.  We will hold anticoagulation for now.  #New onset systolic heart failure, EF 35-40% -Bedridden with bilateral leg amputations.  Not a great candidate for aggressive cardiovascular care. -Metoprolol Entresto being held due to hypotension yesterday.  BP improved. -Continue amiodarone for rhythm control. -Digoxin 0.125 mg daily was added yesterday.   Improvement in blood pressure. -We will assess his ability to tolerate GDMT tomorrow. -He is a bit volume up.  We will give 40 mg of p.o. Lasix today. -Overall not a great candidate for aggressive cardiovascular care.  Goals of care as indicated.  Would likely recommend hospice care or palliative approach.  For questions or updates, please contact [facility] Please consult [email or web address] for contact info under   Time Spent with Patient: I have spent a total of 35 minutes with patient reviewing hospital notes, telemetry, EKGs, labs and examining the patient as well as establishing an assessment and plan that was discussed with the patient.  > 50% of time was spent in direct patient care.    Signed, Lenna Gilford. Flora Lipps, MD, [facility]  [facility]  07/22/2021 8:50 AM

## 2021-07-22 NOTE — Progress Notes (Signed)
PROGRESS NOTE    JDEN WANT   DXA:[id number]  DOB: 01/01/1946  DOA: 06/27/2021 PCP: Pcp, No   Brief Narrative:  Todd Duran is an 75 y.o. male with past medical history of diabetes mellitus type 2, essential hypertension, bilateral BKA , on 05/10/2021 patient received left BKA, came back on 06/05/2021 for right toe amputation right and femoral to popliteal artery bypass grafting which was done on 06/10/2021.  On 06/20/2021 patient presented with an infected right foot and on 06/21/2021 had a right BKA.  On 06/23/2021 was sent to CIR, 06/27/2021 vascular surgery was consulted for for bleeding from the groin site and a dropping hemoglobin to 6.9.  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  On 06/30/2021 revision of the right femoral retroperitoneal and right iliac artery exposure, with a redo of the right femoral artery with endarterectomy and placement of multiple Blodgett sutures for hemostasis.  Postop culture grew Pseudomonas, which was treated with IV cefepime.  On 07/03/2021 he had an acute bleeding from his right groin with wound VAC output greater than 500 mL.  Right groin exploration and evacuation was required of the right groin hematoma.  On 07/04/2021 patient was taken back to the OR for reexploration of the right groin with ligation of the right femoropopliteal vein bypass and ligation of the right external iliac artery, excision of the right bovine pericardial patch and excision of right common femoral artery and negative pressure drain to the right groin.  Admitted to the ICU requiring mechanical ventilation subsequently extubated however he developed fluid overload for which she required BiPAP for few days.  Cardiology was consulted as he appears to be newly diagnosed with an EF of 30% and global hypokinesia. There was a mild elevation in troponins  thought to be demand ischemia.Patient was placed on IV diuretics with good response and has been weaned to room air.    Significant events: 8/27: right groin re-exploration and evacuation of hematoma, very friable vessels, ultimately opted for ligation.  8/28 more comfortable. No signs of bleeding.  But ended up on BIPAP; 2/2 edema seemed to respond to edema  8/29 adding back home meds. Palliative consulted by primary team.  Echocardiogram: Ejection fraction 30 to 35%, global hypokinesis, LV mildly dilated.  Diastolic parameters consistent with grade 2 diastolic dysfunction.  RV size normal, RV function normal.  Lactate elevated at 4 8/30: Quiet evening.  Blood pressure under control.  Lactate cleared to 0.7 bedside and assessment of lower limb, seemed a little bit warmer on exam.  No distress. Overnight patient got SOB. Pulm edema given lasix and placed on bipap 8/31: patient got sob with pulm edema. Placed on bipap for a few hours and lasix given. 9/6: Plan is for revision of the right BKA     Subjective: No complaints    Assessment & Plan:   Principal Problem:   Acute hypercapnic respiratory failureAcute systolic heart failure (new diagnosis) - required BiPAP  -2D echo reveals an EF of 35 to 40% - Has been adequately diuresed - Started on Entresto, carvedilol, aspirin, Plavix and statin -Antihypertensives being held due to hypotension - Cardiology managing Lasix   Active Problems:   Peripheral artery disease-bilateral BKA -9/6 - underwent right AKA for nonhealing right BKA -History of ligation of external iliac artery for bleeding - Continue aspirin Plavix  Right groin pseudomonal infection - Currently receiving cefepime (  started 8/25) via PICC line (placed 8/28) with plans of a 6-week course -Also receiving vancomycin - Blood cultures negative  Atrial fibrillation with RVR (new diagnosis) - Initiated on IV amiodarone due to con commitment hypotension - Cardiology planning  to hold off on anticoagulation in setting of acute blood loss anemia  Acute blood loss anemia - Continue to replace as needed  Moderate protein calorie malnutrition - Core track placed on 9/9-continue tube feeds  Diabetes mellitus type 2 - Last A1c was 5.2 - Continue sliding scale insulin  Pressure Injury 06/05/21 Coccyx Mid;Other (Comment) Stage 2 -  Partial thickness loss of dermis presenting as a shallow open injury with a red, pink wound bed without slough. (Active)  06/05/21 2200  Location: Coccyx  Location Orientation: Mid;Other (Comment) (coccyx)  Staging: Stage 2 -  Partial thickness loss of dermis presenting as a shallow open injury with a red, pink wound bed without slough.  Wound Description (Comments):   Present on Admission: Yes      Disposition: unfortunately, it appears that his prognosis is poor- continue palliative discussions  Time spent in minutes: 35 DVT prophylaxis: heparin injection 5,000 Units Start: 07/15/21 1400  Code Status: DNR Family Communication:  Level of Care: Level of care: Progressive Disposition Plan:  Status is: Inpatient  Remains inpatient appropriate because:IV treatments appropriate due to intensity of illness or inability to take PO  Dispo: The patient is from: Home              Anticipated d/c is to:  TBD              Patient currently is not medically stable to d/c.   Difficult to place patient Yes     Procedures:  Right AKA-Dr. Edilia Bo Antimicrobials:  Anti-infectives (From admission, onward)    Start     Dose/Rate Route Frequency Ordered Stop   07/22/21 1500  vancomycin (VANCOREADY) IVPB 750 mg/150 mL        750 mg 150 mL/hr over 60 Minutes Intravenous Every 24 hours 07/21/21 1340     07/21/21 1430  vancomycin (VANCOCIN) IVPB 1000 mg/200 mL premix        1,000 mg 200 mL/hr over 60 Minutes Intravenous  Once 07/21/21 1340 07/21/21 1647   07/02/21 1300  ceFEPIme (MAXIPIME) 2 g in sodium chloride 0.9 % 100 mL IVPB         2 g 200 mL/hr over 30 Minutes Intravenous Every 12 hours 07/02/21 1201 08/16/21 1259   06/28/21 0200  ceFAZolin (ANCEF) IVPB 2g/100 mL premix        2 g 200 mL/hr over 30 Minutes Intravenous Every 8 hours 06/27/21 2320 06/28/21 1002        Objective: Vitals:   07/22/21 0404 07/22/21 0600 07/22/21 1150 07/22/21 1524  BP: 92/60 (!) 95/55 109/67 114/71  Pulse: 94 84 (!) 104 90  Resp: (!) 22 20 18 20   Temp: 99.2 F (37.3 C) 98.3 F (36.8 C) (!) 97.5 F (36.4 C) 98.6 F (37 C)  TempSrc: Oral Oral Oral Oral  SpO2: 98% 100% 100% 100%  Weight:      Height:        Intake/Output Summary (Last 24 hours) at 07/22/2021 1644 Last data filed at 07/22/2021 1000 Gross per 24 hour  Intake 4275.88 ml  Output 451 ml  Net 3824.88 ml   Filed Weights   07/19/21 0453 07/20/21 0408 07/21/21 0329  Weight: 47.1 kg 47.1 kg 48.3 kg    Examination:  General exam: Appears comfortable  HEENT: PERRLA, oral mucosa moist, no sclera icterus or thrush Respiratory system: Clear to auscultation. Respiratory effort normal. Cardiovascular system: S1 & S2 heard, IIRR  Gastrointestinal system: Abdomen soft, non-tender, nondistended. Normal bowel sounds. Central nervous system: Alert -. No focal neurological deficits. Extremities: right AKA, left BKA       Data Reviewed: I have personally reviewed following labs and imaging studies  CBC: Recent Labs  Lab 07/16/21 0356 07/17/21 0434 07/18/21 0445 07/20/21 0546 07/21/21 0808  WBC 11.4* 12.9* 11.0* 10.2 10.1  NEUTROABS  --   --   --  8.0*  --   HGB 9.5* 8.6* 8.5* 7.4* 7.3*  HCT 29.1* 28.1* 27.4* 24.1* 24.3*  MCV 86.9 89.8 89.5 90.3 91.0  PLT 361 374 379 318 310   Basic Metabolic Panel: Recent Labs  Lab 07/16/21 1100 07/17/21 0434 07/17/21 2105 07/18/21 0445 07/18/21 1700 07/19/21 0542 07/19/21 1638 07/20/21 0546 07/21/21 0808  NA 136 137  --   --   --   --   --  142 144  K 2.9* 4.6  --   --   --   --   --  4.2 4.3  CL 108 109  --   --    --   --   --  114* 111  CO2 22 22  --   --   --   --   --  22 25  GLUCOSE 135* 92  --   --   --   --   --  111* 142*  BUN 19 27*  --   --   --   --   --  54* 61*  CREATININE 0.69 0.94  --   --   --   --   --  0.93 1.04  CALCIUM 7.3* 8.6*  --   --   --   --   --  8.7* 8.9  MG  --   --    < > 1.8 2.0 1.8 2.0 2.0 2.4  PHOS  --   --    < > 3.7 3.2 2.7 2.7 3.2  --    < > = values in this interval not displayed.   GFR: Estimated Creatinine Clearance: 43.2 mL/min (by C-G formula based on SCr of 1.04 mg/dL). Liver Function Tests: Recent Labs  Lab 07/20/21 0546  AST 15  ALT 12  ALKPHOS 101  BILITOT 0.7  PROT 6.4*  ALBUMIN 1.2*   No results for input(s): LIPASE, AMYLASE in the last 168 hours. No results for input(s): AMMONIA in the last 168 hours. Coagulation Profile: No results for input(s): INR, PROTIME in the last 168 hours. Cardiac Enzymes: No results for input(s): CKTOTAL, CKMB, CKMBINDEX, TROPONINI in the last 168 hours. BNP (last 3 results) No results for input(s): PROBNP in the last 8760 hours. HbA1C: Recent Labs    07/21/21 0808  HGBA1C 5.4   CBG: Recent Labs  Lab 07/21/21 2108 07/22/21 0037 07/22/21 0447 07/22/21 0830 07/22/21 1158  GLUCAP 93 151* 165* 195* 173*   Lipid Profile: No results for input(s): CHOL, HDL, LDLCALC, TRIG, CHOLHDL, LDLDIRECT in the last 72 hours. Thyroid Function Tests: No results for input(s): TSH, T4TOTAL, FREET4, T3FREE, THYROIDAB in the last 72 hours. Anemia Panel: No results for input(s): VITAMINB12, FOLATE, FERRITIN, TIBC, IRON, RETICCTPCT in the last 72 hours. Urine analysis:    Component Value Date/Time   COLORURINE YELLOW 05/16/2021 1551   APPEARANCEUR CLOUDY (A) 05/16/2021 1551  LABSPEC 1.014 05/16/2021 1551   PHURINE 5.0 05/16/2021 1551   GLUCOSEU NEGATIVE 05/16/2021 1551   HGBUR NEGATIVE 05/16/2021 1551   BILIRUBINUR NEGATIVE 05/16/2021 1551   KETONESUR NEGATIVE 05/16/2021 1551   PROTEINUR 30 (A) 05/16/2021 1551    NITRITE NEGATIVE 05/16/2021 1551   LEUKOCYTESUR LARGE (A) 05/16/2021 1551   Sepsis Labs: @LABRCNTIP (procalcitonin:4,lacticidven:4) ) Recent Results (from the past 240 hour(s))  Urine Culture     Status: Abnormal   Collection Time: 07/21/21 11:42 AM   Specimen: Urine, Clean Catch  Result Value Ref Range Status   Specimen Description URINE, CLEAN CATCH  Final   Special Requests   Final    NONE Performed at [facility], [address], [place], [place] [address]    Culture 80,000 COLONIES/mL YEAST (A)  Final   Report Status 07/22/2021 FINAL  Final  Culture, blood (Routine X 2) w Reflex to ID Panel     Status: None (Preliminary result)   Collection Time: 07/21/21 12:10 PM   Specimen: BLOOD  Result Value Ref Range Status   Specimen Description BLOOD LEFT ANTECUBITAL  Final   Special Requests   Final    BOTTLES DRAWN AEROBIC AND ANAEROBIC Blood Culture adequate volume   Culture   Final    NO GROWTH < 24 HOURS Performed at [facility], [address] [place], [place] [address]    Report Status PENDING  Incomplete  Culture, blood (Routine X 2) w Reflex to ID Panel     Status: None (Preliminary result)   Collection Time: 07/21/21 12:10 PM   Specimen: BLOOD LEFT FOREARM  Result Value Ref Range Status   Specimen Description BLOOD LEFT FOREARM  Final   Special Requests   Final    BOTTLES DRAWN AEROBIC AND ANAEROBIC Blood Culture adequate volume   Culture   Final    NO GROWTH < 24 HOURS Performed at [facility], [address] [place], [place] [address]    Report Status PENDING  Incomplete         Radiology Studies: DG CHEST PORT 1 VIEW  Result Date: 07/21/2021 CLINICAL DATA:  Altered mental status. EXAM: PORTABLE CHEST 1 VIEW COMPARISON:  07/09/2021 FINDINGS: Single-view of the chest demonstrates clear lungs. Heart size is normal. There is a feeding tube that extends into the abdomen but the tip is beyond the image. Negative for a pneumothorax. Bone  structures are unremarkable. Right arm PICC line with the tip in the upper SVC region. IMPRESSION: 1. No acute cardiopulmonary disease. 2. Support apparatuses as described. Electronically Signed   By: Richarda Overlie M.D.   On: 07/21/2021 10:23      Scheduled Meds:  aspirin  81 mg Per Tube Daily   atorvastatin  40 mg Per Tube QHS   Chlorhexidine Gluconate Cloth  6 each Topical Q0600   clopidogrel  75 mg Per Tube Daily   dapagliflozin propanediol  10 mg Oral Daily   digoxin  0.125 mg Oral Daily   docusate  100 mg Per Tube BID   feeding supplement (PROSource TF)  45 mL Per Tube TID   gabapentin  100 mg Oral TID   heparin injection (subcutaneous)  5,000 Units Subcutaneous Q8H   insulin aspart  0-9 Units Subcutaneous TID WC   insulin detemir  10 Units Subcutaneous BID   mouth rinse  15 mL Mouth Rinse BID   methocarbamol  750 mg Per Tube QID   multivitamin with minerals  1 tablet  Per Tube Daily   pantoprazole sodium  40 mg Per Tube Daily   polyethylene glycol  17 g Per Tube Daily   sennosides  10 mL Per Tube QHS   sodium chloride flush  10-40 mL Intracatheter Q12H   Continuous Infusions:  sodium chloride 250 mL (07/15/21 0048)   amiodarone 30 mg/hr (07/22/21 1614)   ceFEPime (MAXIPIME) IV 2 g (07/22/21 1225)   feeding supplement (OSMOLITE 1.5 CAL) 1,000 mL (07/22/21 1624)   magnesium sulfate bolus IVPB     vancomycin 750 mg (07/22/21 1536)     LOS: 25 days      Calvert Cantor, MD [facility] Pager: [email or web address] 07/22/2021, 4:44 PM

## 2021-07-22 NOTE — Progress Notes (Addendum)
  Progress Note    07/22/2021 8:25 AM 8 Days Post-Op  Subjective:  no complaints   Vitals:   07/22/21 0300 07/22/21 0404  BP:  92/60  Pulse:  94  Resp: 20 (!) 22  Temp:  99.2 F (37.3 C)  SpO2:  98%   Physical Exam: Lungs:  non labored Incisions:  R groin incision with minimal granulation tissue Extremities:  R AKA with viable skin edges Neurologic: somewhat confused  CBC    Component Value Date/Time   WBC 10.1 07/21/2021 0808   RBC 2.67 (L) 07/21/2021 0808   HGB 7.3 (L) 07/21/2021 0808   HCT 24.3 (L) 07/21/2021 0808   PLT 310 07/21/2021 0808   MCV 91.0 07/21/2021 0808   MCH 27.3 07/21/2021 0808   MCHC 30.0 07/21/2021 0808   RDW 15.9 (H) 07/21/2021 0808   LYMPHSABS 1.2 07/20/2021 0546   MONOABS 0.8 07/20/2021 0546   EOSABS 0.1 07/20/2021 0546   BASOSABS 0.0 07/20/2021 0546    BMET    Component Value Date/Time   NA 144 07/21/2021 0808   K 4.3 07/21/2021 0808   CL 111 07/21/2021 0808   CO2 25 07/21/2021 0808   GLUCOSE 142 (H) 07/21/2021 0808   BUN 61 (H) 07/21/2021 0808   CREATININE 1.04 07/21/2021 0808   CALCIUM 8.9 07/21/2021 0808   GFRNONAA >60 07/21/2021 0808    INR    Component Value Date/Time   INR 1.2 06/30/2021 1608     Intake/Output Summary (Last 24 hours) at 07/22/2021 0825 Last data filed at 07/22/2021 0759 Gross per 24 hour  Intake 2821.88 ml  Output 701 ml  Net 2120.88 ml     Assessment/Plan:  75 y.o. male is s/p ligation of R EIA and AKA 8 Days Post-Op   -R AKA appears viable -Minimal signs of healing in R groin wound bed likely due to nutrition; consider replacement of wound vac; Dr. Myra Duran also considering evaluation by plastic surgery  -Restarted on amiodarone per Cardiology yesterday as he was in and out of atrial flutter -[name] consulted for SNF placement if patient improves clinically   Todd Rutter, PA-C [facility] [phone] 07/22/2021 8:25 AM   I agree with the above.  Will replace wound  vac.  May consider plastic consult for right groin wound  Planning for SNF  Todd Duran

## 2021-07-22 NOTE — Progress Notes (Signed)
Occupational Therapy Treatment Patient Details Name: Todd Duran MRN: [id number] DOB: [birth year] Today's Date: 07/22/2021   History of present illness Pt is a 75 y.o. male admitted with multiple readmissions from CIR for vascular intervention, now readmitted from CIR on 06/27/21 with R groin pseudoaneurysm. S/p exploration, wound vac placed 8/21. Pt with extensive bleeding 8/27 with return to OR emergently x2 s/p external iliac artery ligation and RLE bypass, excision of infected patch in R femoral artery. ETT 8/27-8/28. 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   OT comments  Pt. Was seen for skilled OT for B ue exercise. Pt. Was able to assist with supine to sit and assisted with sitting eob for 10 min. Pt. Was returned to supine and had a bowel movement. Pt. Worked on bed mobility rolling and lifting head and shld off of bed to pull up in bed. Acute ot to follow.    Recommendations for follow up therapy are one component of a multi-disciplinary discharge planning process, led by the attending physician.  Recommendations may be updated based on patient status, additional functional criteria and insurance authorization.    Follow Up Recommendations  SNF    Equipment Recommendations   (tbd at dc location)    Recommendations for Other Services      Precautions / Restrictions Precautions Precautions: Fall Precaution Comments: L AKA (05/20/2021), R BKA to AKA (07/14/2021), confusion, cortrak Required Braces or Orthoses: Other Brace Other Brace: limb protector Restrictions RLE Weight Bearing: Non weight bearing LLE Weight Bearing: Non weight bearing       Mobility Bed Mobility     Rolling: Max assist Sidelying to sit: Max assist;HOB elevated Supine to sit: Max assist;HOB elevated Sit to supine: Total assist;HOB elevated   General bed mobility comments: cues for  proper hand placement    Transfers                      Balance     Sitting balance-Leahy Scale: Poor                                     ADL either performed or assessed with clinical judgement   ADL Overall ADL's : Needs assistance/impaired     Grooming: Wash/dry hands;Wash/dry face;Moderate assistance;Bed level       Lower Body Bathing: Total assistance;Bed level   Upper Body Dressing : Moderate assistance;Bed level           Toileting- Clothing Manipulation and Hygiene: Total assistance;Bed level         General ADL Comments: ADLs in supine with hob elevated.     Vision   Vision Assessment?: No apparent visual deficits   Perception     Praxis      Cognition Arousal/Alertness: Awake/alert Behavior During Therapy: Flat affect;Restless;Agitated Overall Cognitive Status: Impaired/Different from baseline Area of Impairment: Attention;Memory;Following commands;Safety/judgement;Awareness;Problem solving;Orientation                 Orientation Level: Disoriented to;Time;Situation   Memory: Decreased short-term memory;Decreased recall of precautions Following Commands: Follows one step commands inconsistently;Follows one step commands with increased time Safety/Judgement: Decreased awareness of safety;Decreased awareness of deficits   Problem Solving: Slow processing;Decreased initiation;Difficulty sequencing;Requires verbal cues;Requires tactile cues General Comments: Pt. alert but requires extra time to follow  directions.        Exercises General Exercises - Upper Extremity Shoulder Flexion: AAROM;Both;10 reps;Supine Shoulder Extension: AROM;Both;10 reps;Supine Elbow Flexion: AAROM;Both;10 reps;Supine Elbow Extension: AAROM;Both;10 reps;Supine   Shoulder Instructions       General Comments      Pertinent Vitals/ Pain       Pain Assessment: No/denies pain  Home Living                                           Prior Functioning/Environment              Frequency  Min 2X/week        Progress Toward Goals  OT Goals(current goals can now be found in the care plan section)  Progress towards OT goals: Progressing toward goals  Acute Rehab OT Goals Patient Stated Goal: family would like pt. to improve OT Goal Formulation: With patient Time For Goal Achievement: 07/28/21 Potential to Achieve Goals: Fair ADL Goals Pt Will Perform Grooming: with modified independence;sitting Pt Will Perform Lower Body Bathing: with min guard assist;sitting/lateral leans Pt Will Perform Lower Body Dressing: with min guard assist;sitting/lateral leans Pt Will Transfer to Toilet: with min guard assist;with transfer board;anterior/posterior transfer;bedside commode Pt Will Perform Toileting - Clothing Manipulation and hygiene: with modified independence;sitting/lateral leans Pt Will Perform Tub/Shower Transfer: Tub transfer;with supervision;tub bench Pt/caregiver will Perform Home Exercise Program: Increased strength;Both right and left upper extremity;Independently;With written HEP provided Additional ADL Goal #1: Pt to verbalize at least 2 fall prevention strategies to maximize safety during ADLs/transfers  Plan Discharge plan remains appropriate    Co-evaluation                 AM-PAC OT "6 Clicks" Daily Activity     Outcome Measure   Help from another person eating meals?: Total Help from another person taking care of personal grooming?: A Lot Help from another person toileting, which includes using toliet, bedpan, or urinal?: Total Help from another person bathing (including washing, rinsing, drying)?: Total Help from another person to put on and taking off regular upper body clothing?: A Lot Help from another person to put on and taking off regular lower body clothing?: Total 6 Click Score: 8    End of Session    OT Visit Diagnosis: Other abnormalities of gait and mobility  (R26.89);Muscle weakness (generalized) (M62.81);Pain;Other symptoms and signs involving cognitive function   Activity Tolerance Patient tolerated treatment well   Patient Left in bed;with call bell/phone within reach;with bed alarm set;with nursing/sitter in room   Nurse Communication  (ok therapy)        Time: 9528-4132 OT Time Calculation (min): 43 min  Charges: OT General Charges $OT Visit: 1 Visit OT Treatments $Self Care/Home Management : 8-22 mins $Therapeutic Activity: 8-22 mins $Therapeutic Exercise: 8-22 mins  Derrek Gu OT/L  Etsuko Dierolf 07/22/2021, 11:56 AM

## 2021-07-23 LAB — IRON AND TIBC
Iron: 19 ug/dL — ABNORMAL LOW (ref 45–182)
Saturation Ratios: 13 % — ABNORMAL LOW (ref 17.9–39.5)
TIBC: 150 ug/dL — ABNORMAL LOW (ref 250–450)
UIBC: 131 ug/dL

## 2021-07-23 LAB — BASIC METABOLIC PANEL
Anion gap: 9 (ref 5–15)
BUN: 85 mg/dL — ABNORMAL HIGH (ref 8–23)
CO2: 22 mmol/L (ref 22–32)
Calcium: 8.6 mg/dL — ABNORMAL LOW (ref 8.9–10.3)
Chloride: 113 mmol/L — ABNORMAL HIGH (ref 98–111)
Creatinine, Ser: 1.28 mg/dL — ABNORMAL HIGH (ref 0.61–1.24)
GFR, Estimated: 59 mL/min — ABNORMAL LOW (ref >60)
Glucose, Bld: 205 mg/dL — ABNORMAL HIGH (ref 70–99)
Potassium: 4.5 mmol/L (ref 3.5–5.1)
Sodium: 144 mmol/L (ref 135–145)

## 2021-07-23 LAB — RETICULOCYTES
Immature Retic Fract: 11.9 % (ref 2.3–15.9)
RBC.: 2.49 MIL/uL — ABNORMAL LOW (ref 4.22–5.81)
Retic Count, Absolute: 45.1 10*3/uL (ref 19.0–186.0)
Retic Ct Pct: 1.8 % (ref 0.4–3.1)

## 2021-07-23 LAB — CBC
HCT: 20.2 % — ABNORMAL LOW (ref 39.0–52.0)
Hemoglobin: 6.1 g/dL — CL (ref 13.0–17.0)
MCH: 28.1 pg (ref 26.0–34.0)
MCHC: 30.2 g/dL (ref 30.0–36.0)
MCV: 93.1 fL (ref 80.0–100.0)
Platelets: 291 10*3/uL (ref 150–400)
RBC: 2.17 MIL/uL — ABNORMAL LOW (ref 4.22–5.81)
RDW: 16.2 % — ABNORMAL HIGH (ref 11.5–15.5)
WBC: 10.3 10*3/uL (ref 4.0–10.5)
nRBC: 0 % (ref 0.0–0.2)

## 2021-07-23 LAB — FOLATE: Folate: 12.4 ng/mL (ref >5.9)

## 2021-07-23 LAB — HEMOGLOBIN AND HEMATOCRIT, BLOOD
HCT: 23.7 % — ABNORMAL LOW (ref 39.0–52.0)
Hemoglobin: 7.3 g/dL — ABNORMAL LOW (ref 13.0–17.0)

## 2021-07-23 LAB — GLUCOSE, CAPILLARY
Glucose-Capillary: 105 mg/dL — ABNORMAL HIGH (ref 70–99)
Glucose-Capillary: 124 mg/dL — ABNORMAL HIGH (ref 70–99)
Glucose-Capillary: 158 mg/dL — ABNORMAL HIGH (ref 70–99)
Glucose-Capillary: 166 mg/dL — ABNORMAL HIGH (ref 70–99)
Glucose-Capillary: 194 mg/dL — ABNORMAL HIGH (ref 70–99)
Glucose-Capillary: 90 mg/dL (ref 70–99)

## 2021-07-23 LAB — FERRITIN: Ferritin: 1125 ng/mL — ABNORMAL HIGH (ref 24–336)

## 2021-07-23 LAB — VITAMIN B12: Vitamin B-12: 509 pg/mL (ref 180–914)

## 2021-07-23 LAB — PREPARE RBC (CROSSMATCH)

## 2021-07-23 MED ORDER — AMIODARONE HCL 200 MG PO TABS
400.0000 mg | ORAL_TABLET | Freq: Two times a day (BID) | ORAL | Status: DC
  Administered 2021-07-23 – 2021-07-25 (×5): 400 mg via ORAL
  Filled 2021-07-23 (×5): qty 2

## 2021-07-23 MED ORDER — SODIUM CHLORIDE 0.9% IV SOLUTION: Freq: Once | INTRAVENOUS | Status: AC

## 2021-07-23 MED ORDER — PANTOPRAZOLE SODIUM 40 MG PO PACK: 40.0000 mg | PACK | Freq: Every day | ORAL | Status: DC

## 2021-07-23 MED ORDER — VANCOMYCIN HCL 500 MG/100ML IV SOLN
500.0000 mg | INTRAVENOUS | Status: DC
  Administered 2021-07-23 – 2021-07-24 (×2): 500 mg via INTRAVENOUS
  Filled 2021-07-23 (×3): qty 100

## 2021-07-23 MED ORDER — AMIODARONE HCL 200 MG PO TABS: 200.0000 mg | ORAL_TABLET | Freq: Every day | ORAL | Status: DC

## 2021-07-23 MED ORDER — PANTOPRAZOLE 2 MG/ML SUSPENSION
40.0000 mg | Freq: Every day | ORAL | Status: DC
  Administered 2021-07-24: 40 mg
  Filled 2021-07-23 (×2): qty 20

## 2021-07-23 MED ORDER — CARVEDILOL 3.125 MG PO TABS
3.1250 mg | ORAL_TABLET | Freq: Two times a day (BID) | ORAL | Status: DC
  Administered 2021-07-23 – 2021-07-25 (×4): 3.125 mg via ORAL
  Filled 2021-07-23 (×4): qty 1

## 2021-07-23 NOTE — Progress Notes (Signed)
Cardiology Progress Note  Patient ID: Todd Duran MRN: [id number] DOB: 15-Dec-1945 Date of Encounter: 07/23/2021  Primary Cardiologist: Parke Poisson, MD  Subjective   Chief Complaint: None.  HPI: Intermittent A. fib with RVR this morning.  Back in sinus rhythm.  Denies any major symptoms.  Hemoglobin down to 6.1.  ROS:  All other ROS reviewed and negative. Pertinent positives noted in the HPI.     Inpatient Medications  Scheduled Meds:  sodium chloride   Intravenous Once   aspirin  81 mg Per Tube Daily   atorvastatin  40 mg Per Tube QHS   Chlorhexidine Gluconate Cloth  6 each Topical Q0600   clopidogrel  75 mg Per Tube Daily   dapagliflozin propanediol  10 mg Oral Daily   digoxin  0.125 mg Oral Daily   docusate  100 mg Per Tube BID   feeding supplement (PROSource TF)  45 mL Per Tube TID   gabapentin  100 mg Oral TID   heparin injection (subcutaneous)  5,000 Units Subcutaneous Q8H   insulin aspart  0-9 Units Subcutaneous TID WC   insulin detemir  10 Units Subcutaneous BID   mouth rinse  15 mL Mouth Rinse BID   methocarbamol  750 mg Per Tube QID   multivitamin with minerals  1 tablet Per Tube Daily   pantoprazole sodium  40 mg Per Tube Daily   polyethylene glycol  17 g Per Tube Daily   sennosides  10 mL Per Tube QHS   sodium chloride flush  10-40 mL Intracatheter Q12H   Continuous Infusions:  sodium chloride Stopped (07/23/21 0423)   amiodarone 30 mg/hr (07/23/21 0444)   ceFEPime (MAXIPIME) IV Stopped (07/23/21 0114)   feeding supplement (OSMOLITE 1.5 CAL) 1,000 mL (07/23/21 0055)   magnesium sulfate bolus IVPB     vancomycin     PRN Meds: sodium chloride, acetaminophen **OR** acetaminophen, alum & mag hydroxide-simeth, magnesium sulfate bolus IVPB, morphine injection, ondansetron, phenol, sodium chloride flush, traMADol   Vital Signs   Vitals:   07/22/21 2110 07/22/21 2331 07/23/21 0810 07/23/21 0845  BP: 102/72 112/70 95/81 (!) 111/59  Pulse: 93 95 (!)  103 85  Resp: 17 20 (!) 21 20  Temp: 98.6 F (37 C) 98.6 F (37 C) (!) 97.5 F (36.4 C) 97.9 F (36.6 C)  TempSrc: Oral Oral Oral   SpO2: 100% 100% 100% 100%  Weight:      Height:        Intake/Output Summary (Last 24 hours) at 07/23/2021 0919 Last data filed at 07/23/2021 0847 Gross per 24 hour  Intake 1832.16 ml  Output 700 ml  Net 1132.16 ml   Last 3 Weights 07/21/2021 07/20/2021 07/19/2021  Weight (lbs) 106 lb 7.7 oz 103 lb 13.4 oz 103 lb 13.4 oz  Weight (kg) 48.3 kg 47.1 kg 47.1 kg      Telemetry  Overnight telemetry shows sinus rhythm in the 70s, intermittent paroxysms of atrial fibrillation, which I personally reviewed.   Physical Exam   Vitals:   07/22/21 2110 07/22/21 2331 07/23/21 0810 07/23/21 0845  BP: 102/72 112/70 95/81 (!) 111/59  Pulse: 93 95 (!) 103 85  Resp: 17 20 (!) 21 20  Temp: 98.6 F (37 C) 98.6 F (37 C) (!) 97.5 F (36.4 C) 97.9 F (36.6 C)  TempSrc: Oral Oral Oral   SpO2: 100% 100% 100% 100%  Weight:      Height:        Intake/Output Summary (Last 24  hours) at 07/23/2021 0919 Last data filed at 07/23/2021 0847 Gross per 24 hour  Intake 1832.16 ml  Output 700 ml  Net 1132.16 ml    Last 3 Weights 07/21/2021 07/20/2021 07/19/2021  Weight (lbs) 106 lb 7.7 oz 103 lb 13.4 oz 103 lb 13.4 oz  Weight (kg) 48.3 kg 47.1 kg 47.1 kg    Body mass index is 14.85 kg/m.  General: Ill-appearing Head: Atraumatic, normal size  Eyes: PEERLA, EOMI  Neck: Supple, no JVD Endocrine: No thryomegaly Cardiac: Normal S1, S2; RRR; no murmurs, rubs, or gallops Lungs: Clear to auscultation bilaterally, no wheezing, rhonchi or rales  Abd: Soft, nontender, no hepatomegaly  Ext: Right AKA, left BKA Musculoskeletal: Right AKA, left BKA Skin: Warm and dry, no rashes   Neuro: Alert, awake, oriented to person only  Labs  High Sensitivity Troponin:   Recent Labs  Lab 07/05/21 2052 07/05/21 2253 07/06/21 0202 07/06/21 0631  TROPONINIHS 126* 192* 265* 265*      Cardiac EnzymesNo results for input(s): TROPONINI in the last 168 hours. No results for input(s): TROPIPOC in the last 168 hours.  Chemistry Recent Labs  Lab 07/20/21 0546 07/21/21 0808 07/23/21 0430  NA 142 144 144  K 4.2 4.3 4.5  CL 114* 111 113*  CO2 22 25 22   GLUCOSE 111* 142* 205*  BUN 54* 61* 85*  CREATININE 0.93 1.04 1.28*  CALCIUM 8.7* 8.9 8.6*  PROT 6.4*  --   --   ALBUMIN 1.2*  --   --   AST 15  --   --   ALT 12  --   --   ALKPHOS 101  --   --   BILITOT 0.7  --   --   GFRNONAA >60 >60 59*  ANIONGAP 6 8 9     Hematology Recent Labs  Lab 07/20/21 0546 07/21/21 0808 07/23/21 0430  WBC 10.2 10.1 10.3  RBC 2.67* 2.67* 2.17*  HGB 7.4* 7.3* 6.1*  HCT 24.1* 24.3* 20.2*  MCV 90.3 91.0 93.1  MCH 27.7 27.3 28.1  MCHC 30.7 30.0 30.2  RDW 15.7* 15.9* 16.2*  PLT 318 310 291   BNPNo results for input(s): BNP, PROBNP in the last 168 hours.  DDimer No results for input(s): DDIMER in the last 168 hours.   Radiology  No results found.  Cardiac Studies  TTE 07/12/2021  1. Left ventricular ejection fraction, by estimation, is 35 to 40%. The  left ventricle has moderately decreased function. Left ventricular  endocardial border not optimally defined to evaluate regional wall motion.   2. Right ventricular systolic function is normal. The right ventricular  size is normal. There is normal pulmonary artery systolic pressure. The  estimated right ventricular systolic pressure is 32.8 mmHg.   3. The mitral valve is abnormal. Moderate mitral valve regurgitation.   4. Tricuspid valve regurgitation is mild to moderate.   5. The aortic valve is abnormal. There is moderate calcification of the  aortic valve. Aortic valve regurgitation is trivial. Mild aortic valve  sclerosis is present, with no evidence of aortic valve stenosis.   Patient Profile  75 year old male with history of diabetes, hypertension, bilateral BKA, right femoral pseudoaneurysm, postop groin infection, anemia  who was admitted initially for complications related to right BKA.  Cardiology was consulted for new onset systolic heart failure and atrial arrhythmias.  Cardiology has been reconsulted on 07/21/2021 for A. fib with RVR as well as atrial flutter.  Assessment & Plan   #New onset atrial fibrillation #  Atrial flutter -New for him as of 07/21/2021. -Started on amiodarone due to low blood pressure.  And out of A. fib.  Transition to oral amiodarone today.  400 mg p.o. twice daily x7 days and then 200 mg daily. -We will continue digoxin 0.125 mg daily as well.  This will help with rate control when he goes into A. fib.  We need to be very cautious of this with his kidney function.  Suspect this is related to anemia.  For now we will continue. -No clear signs of infection.  Hypotension likely was related to anemia.  He has received transfusion. -Still quite confused. -Not a candidate for anticoagulation given anemia.  #New onset systolic heart failure, 35-40% -Bed ridden with bilateral leg amputations.  Being treated for infection as well.  Not a candidate for aggressive cardiovascular care. -Continue digoxin 0.125 mg daily.  Very cautious with this given kidney function. -Amiodarone for rhythm control strategy. -We will add back Coreg 3.125 mg twice daily today.  Likely add Entresto tomorrow. -Overall very unclear benefit of aggressive cardiovascular care.  He has had a very prolonged hospitalization and is bedridden.  I am unsure of how much improvement he will have. -Likely needs to have discussion about goals of care and transition to palliative or hospice approach. -Given 40 mg of Lasix yesterday.  We can continue to assess his need for diuresis.  Today would hold given his anemia and receiving blood.  If you become short of breath after blood products would give him Lasix.  Overall he is stable currently.  #Anemia -related to oozing from wound -would discuss with vascular needed for DAPT -no AC  for Afib due to low HGB  For questions or updates, please contact [facility] Please consult [email or web address] for contact info under   Time Spent with Patient: I have spent a total of 35 minutes with patient reviewing hospital notes, telemetry, EKGs, labs and examining the patient as well as establishing an assessment and plan that was discussed with the patient.  > 50% of time was spent in direct patient care.    Signed, Lenna Gilford. Flora Lipps, MD, [facility]  [facility]  07/23/2021 9:19 AM

## 2021-07-23 NOTE — Progress Notes (Signed)
PROGRESS NOTE    Todd Duran   ZOX:[id number]  DOB: November 20, 1945  DOA: 06/27/2021 PCP: Pcp, No   Brief Narrative:  Todd Duran is an 75 y.o. male with past medical history of diabetes mellitus type 2, essential hypertension, bilateral BKA , on 05/10/2021 patient received left BKA, came back on 06/05/2021 for right toe amputation right and femoral to popliteal artery bypass grafting which was done on 06/10/2021.  On 06/20/2021 patient presented with an infected right foot and on 06/21/2021 had a right BKA.  On 06/23/2021 was sent to CIR, 06/27/2021 vascular surgery was consulted for for bleeding from the groin site and a dropping hemoglobin to 6.9.  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  On 06/30/2021 revision of the right femoral retroperitoneal and right iliac artery exposure, with a redo of the right femoral artery with endarterectomy and placement of multiple Blodgett sutures for hemostasis.  Postop culture grew Pseudomonas, which was treated with IV cefepime.  On 07/03/2021 he had an acute bleeding from his right groin with wound VAC output greater than 500 mL.  Right groin exploration and evacuation was required of the right groin hematoma.  On 07/04/2021 patient was taken back to the OR for reexploration of the right groin with ligation of the right femoropopliteal vein bypass and ligation of the right external iliac artery, excision of the right bovine pericardial patch and excision of right common femoral artery and negative pressure drain to the right groin.  Admitted to the ICU requiring mechanical ventilation subsequently extubated however he developed fluid overload for which she required BiPAP for few days.  Cardiology was consulted as he appears to be newly diagnosed with an EF of 30% and global hypokinesia. There was a mild elevation in troponins  thought to be demand ischemia.Patient was placed on IV diuretics with good response and has been weaned to room air.    Significant events: 8/27: right groin re-exploration and evacuation of hematoma, very friable vessels, ultimately opted for ligation.  8/28 more comfortable. No signs of bleeding.  But ended up on BIPAP; 2/2 edema seemed to respond to edema  8/29 adding back home meds. Palliative consulted by primary team.  Echocardiogram: Ejection fraction 30 to 35%, global hypokinesis, LV mildly dilated.  Diastolic parameters consistent with grade 2 diastolic dysfunction.  RV size normal, RV function normal.  Lactate elevated at 4 8/30: Quiet evening.  Blood pressure under control.  Lactate cleared to 0.7 bedside and assessment of lower limb, seemed a little bit warmer on exam.  No distress. Overnight patient got SOB. Pulm edema given lasix and placed on bipap 8/31: patient got sob with pulm edema. Placed on bipap for a few hours and lasix given. 9/6: Plan is for revision of the right BKA     Subjective: He has no complaints. Does not respond to questions.     Assessment & Plan:   Principal Problem:   Acute hypercapnic respiratory failure Acute systolic heart failure (new diagnosis) - required BiPAP  -2D echo reveals an EF of 35 to 40% - Has been adequately diuresed - Started on Entresto, carvedilol, aspirin, Plavix and statin however due to hypotension, medications being adjusted by cardiology  Active Problems:   Peripheral artery disease-bilateral BKA -9/6 - underwent right AKA for nonhealing right BKA -History of ligation of external iliac artery for bleeding - Continue Aspirin Plavix  Right  groin pseudomonal infection - Currently receiving cefepime (started 8/25) via PICC line (placed 8/28) with plans of a 6-week course -Also receiving vancomycin - Blood cultures negative  Atrial fibrillation with RVR (new diagnosis) - Initiated on IV amiodarone due to concommitment  hypotension - Cardiology planning to hold off on anticoagulation in setting of acute blood loss anemia  Acute blood loss anemia - Continue to replace as needed- he has required 5 units PRBC so far - Hgb 6.1 today and receiving another transfusion - will check anemia panel tomorrow and replace Iron as well if needed.  Moderate protein calorie malnutrition - Core track placed on 9/9-continue tube feeds  Diabetes mellitus type 2 - Last A1c was 5.2 - Continue sliding scale insulin  Pressure Injury 06/05/21 Coccyx Mid;Other (Comment) Stage 2 -  Partial thickness loss of dermis presenting as a shallow open injury with a red, pink wound bed without slough. (Active)  06/05/21 2200  Location: Coccyx  Location Orientation: Mid;Other (Comment) (coccyx)  Staging: Stage 2 -  Partial thickness loss of dermis presenting as a shallow open injury with a red, pink wound bed without slough.  Wound Description (Comments):   Present on Admission: Yes      Disposition: unfortunately, it appears that his prognosis is poor- continue palliative discussions  Time spent in minutes: 35 DVT prophylaxis: heparin injection 5,000 Units Start: 07/15/21 1400  Code Status: DNR Family Communication:  Level of Care: Level of care: Progressive Disposition Plan:  Status is: Inpatient  Remains inpatient appropriate because:IV treatments appropriate due to intensity of illness or inability to take PO  Dispo: The patient is from: Home              Anticipated d/c is to:  TBD              Patient currently is not medically stable to d/c.   Difficult to place patient Yes     Procedures:  Right AKA-Dr. Edilia Bo Antimicrobials:  Anti-infectives (From admission, onward)    Start     Dose/Rate Route Frequency Ordered Stop   07/23/21 1500  vancomycin (VANCOREADY) IVPB 500 mg/100 mL        500 mg 100 mL/hr over 60 Minutes Intravenous Every 24 hours 07/23/21 0729     07/22/21 1500  vancomycin (VANCOREADY) IVPB  750 mg/150 mL  Status:  Discontinued        750 mg 150 mL/hr over 60 Minutes Intravenous Every 24 hours 07/21/21 1340 07/23/21 0729   07/21/21 1430  vancomycin (VANCOCIN) IVPB 1000 mg/200 mL premix        1,000 mg 200 mL/hr over 60 Minutes Intravenous  Once 07/21/21 1340 07/21/21 1647   07/02/21 1300  ceFEPIme (MAXIPIME) 2 g in sodium chloride 0.9 % 100 mL IVPB        2 g 200 mL/hr over 30 Minutes Intravenous Every 12 hours 07/02/21 1201 08/16/21 1259   06/28/21 0200  ceFAZolin (ANCEF) IVPB 2g/100 mL premix        2 g 200 mL/hr over 30 Minutes Intravenous Every 8 hours 06/27/21 2320 06/28/21 1002        Objective: Vitals:   07/23/21 0810 07/23/21 0845 07/23/21 1101 07/23/21 1140  BP: 95/81 (!) 111/59 106/64 116/67  Pulse: (!) 103 85 74 80  Resp: (!) 21 20 (!) 23 (!) 21  Temp: (!) 97.5 F (36.4 C) 97.9 F (36.6 C) 97.9 F (36.6 C) (!) 97.4 F (36.3 C)  TempSrc: Oral  Axillary Oral  SpO2: 100% 100% 100% 100%  Weight:      Height:        Intake/Output Summary (Last 24 hours) at 07/23/2021 1356 Last data filed at 07/23/2021 1228 Gross per 24 hour  Intake 2042.16 ml  Output 700 ml  Net 1342.16 ml    Filed Weights   07/19/21 0453 07/20/21 0408 07/21/21 0329  Weight: 47.1 kg 47.1 kg 48.3 kg    Examination: General exam: Appears comfortable  HEENT: PERRL, no sclera icterus or thrush- cor trak present Respiratory system: Clear to auscultation. Respiratory effort normal. Cardiovascular system: S1 & S2 heard, regular rate and rhythm Gastrointestinal system: Abdomen soft, non-tender, nondistended. Normal bowel sounds   Central nervous system: Alert but nonverbal- does not follow commands. Extremities: right AKA with wound vac in place, staples are intact, left BKA Skin: No rashes or ulcers Psychiatry:  confused-   Data Reviewed: I have personally reviewed following labs and imaging studies  CBC: Recent Labs  Lab 07/17/21 0434 07/18/21 0445 07/20/21 0546  07/21/21 0808 07/23/21 0430  WBC 12.9* 11.0* 10.2 10.1 10.3  NEUTROABS  --   --  8.0*  --   --   HGB 8.6* 8.5* 7.4* 7.3* 6.1*  HCT 28.1* 27.4* 24.1* 24.3* 20.2*  MCV 89.8 89.5 90.3 91.0 93.1  PLT 374 379 318 310 291    Basic Metabolic Panel: Recent Labs  Lab 07/17/21 0434 07/17/21 2105 07/18/21 0445 07/18/21 1700 07/19/21 0542 07/19/21 1638 07/20/21 0546 07/21/21 0808 07/23/21 0430  NA 137  --   --   --   --   --  142 144 144  K 4.6  --   --   --   --   --  4.2 4.3 4.5  CL 109  --   --   --   --   --  114* 111 113*  CO2 22  --   --   --   --   --  22 25 22   GLUCOSE 92  --   --   --   --   --  111* 142* 205*  BUN 27*  --   --   --   --   --  54* 61* 85*  CREATININE 0.94  --   --   --   --   --  0.93 1.04 1.28*  CALCIUM 8.6*  --   --   --   --   --  8.7* 8.9 8.6*  MG  --    < > 1.8 2.0 1.8 2.0 2.0 2.4  --   PHOS  --    < > 3.7 3.2 2.7 2.7 3.2  --   --    < > = values in this interval not displayed.    GFR: Estimated Creatinine Clearance: 35.1 mL/min (A) (by C-G formula based on SCr of 1.28 mg/dL (H)). Liver Function Tests: Recent Labs  Lab 07/20/21 0546  AST 15  ALT 12  ALKPHOS 101  BILITOT 0.7  PROT 6.4*  ALBUMIN 1.2*    No results for input(s): LIPASE, AMYLASE in the last 168 hours. No results for input(s): AMMONIA in the last 168 hours. Coagulation Profile: No results for input(s): INR, PROTIME in the last 168 hours. Cardiac Enzymes: No results for input(s): CKTOTAL, CKMB, CKMBINDEX, TROPONINI in the last 168 hours. BNP (last 3 results) No results for input(s): PROBNP in the last 8760 hours. HbA1C: Recent Labs    07/21/21 0808  HGBA1C 5.4  CBG: Recent Labs  Lab 07/22/21 2218 07/22/21 2334 07/23/21 0332 07/23/21 0809 07/23/21 1246  GLUCAP 138* 148* 158* 194* 166*    Lipid Profile: No results for input(s): CHOL, HDL, LDLCALC, TRIG, CHOLHDL, LDLDIRECT in the last 72 hours. Thyroid Function Tests: No results for input(s): TSH, T4TOTAL,  FREET4, T3FREE, THYROIDAB in the last 72 hours. Anemia Panel: No results for input(s): VITAMINB12, FOLATE, FERRITIN, TIBC, IRON, RETICCTPCT in the last 72 hours. Urine analysis:    Component Value Date/Time   COLORURINE YELLOW 05/16/2021 1551   APPEARANCEUR CLOUDY (A) 05/16/2021 1551   LABSPEC 1.014 05/16/2021 1551   PHURINE 5.0 05/16/2021 1551   GLUCOSEU NEGATIVE 05/16/2021 1551   HGBUR NEGATIVE 05/16/2021 1551   BILIRUBINUR NEGATIVE 05/16/2021 1551   KETONESUR NEGATIVE 05/16/2021 1551   PROTEINUR 30 (A) 05/16/2021 1551   NITRITE NEGATIVE 05/16/2021 1551   LEUKOCYTESUR LARGE (A) 05/16/2021 1551   Sepsis Labs: @LABRCNTIP (procalcitonin:4,lacticidven:4) ) Recent Results (from the past 240 hour(s))  Urine Culture     Status: Abnormal   Collection Time: 07/21/21 11:42 AM   Specimen: Urine, Clean Catch  Result Value Ref Range Status   Specimen Description URINE, CLEAN CATCH  Final   Special Requests   Final    NONE Performed at [facility], [address] [place], [place]    Culture 80,000 COLONIES/mL YEAST (A)  Final   Report Status 07/22/2021 FINAL  Final  Culture, blood (Routine X 2) w Reflex to ID Panel     Status: None (Preliminary result)   Collection Time: 07/21/21 12:10 PM   Specimen: BLOOD  Result Value Ref Range Status   Specimen Description BLOOD LEFT ANTECUBITAL  Final   Special Requests   Final    BOTTLES DRAWN AEROBIC AND ANAEROBIC Blood Culture adequate volume   Culture   Final    NO GROWTH 2 DAYS Performed at [facility], [address] [place], [place]    Report Status PENDING  Incomplete  Culture, blood (Routine X 2) w Reflex to ID Panel     Status: None (Preliminary result)   Collection Time: 07/21/21 12:10 PM   Specimen: BLOOD LEFT FOREARM  Result Value Ref Range Status   Specimen Description BLOOD LEFT FOREARM  Final   Special Requests   Final    BOTTLES DRAWN AEROBIC AND ANAEROBIC Blood Culture adequate volume    Culture   Final    NO GROWTH 2 DAYS Performed at [facility], [address], [place], [place]    Report Status PENDING  Incomplete         Radiology Studies: No results found.    Scheduled Meds:  amiodarone  400 mg Oral BID   Followed by   16384 ON 07/30/2021] amiodarone  200 mg Oral Daily   aspirin  81 mg Per Tube Daily   atorvastatin  40 mg Per Tube QHS   carvedilol  3.125 mg Oral BID WC   Chlorhexidine Gluconate Cloth  6 each Topical Q0600   clopidogrel  75 mg Per Tube Daily   dapagliflozin propanediol  10 mg Oral Daily   digoxin  0.125 mg Oral Daily   docusate  100 mg Per Tube BID   feeding supplement (PROSource TF)  45 mL Per Tube TID   gabapentin  100 mg Oral TID   heparin injection (subcutaneous)  5,000 Units Subcutaneous Q8H   insulin aspart  0-9 Units Subcutaneous TID WC   insulin  detemir  10 Units Subcutaneous BID   mouth rinse  15 mL Mouth Rinse BID   methocarbamol  750 mg Per Tube QID   multivitamin with minerals  1 tablet Per Tube Daily   [START ON 07/24/2021] pantoprazole sodium  40 mg Per Tube Daily   polyethylene glycol  17 g Per Tube Daily   sennosides  10 mL Per Tube QHS   sodium chloride flush  10-40 mL Intracatheter Q12H   Continuous Infusions:  sodium chloride Stopped (07/23/21 0423)   ceFEPime (MAXIPIME) IV 2 g (07/23/21 1215)   feeding supplement (OSMOLITE 1.5 CAL) 1,000 mL (07/23/21 0055)   magnesium sulfate bolus IVPB     vancomycin       LOS: 26 days      Calvert Cantor, MD [facility] Pager: [email or web address] 07/23/2021, 1:56 PM

## 2021-07-23 NOTE — Progress Notes (Signed)
Date and time results received: 07/23/21 0500 (use smartphrase ".now" to insert current time)  Test: Hgb Critical Value: 6.1  Name of Provider Notified: Dr. Kathrine Haddock  Orders Received? Or Actions Taken?: Prepare and give 1 Unit of RBCs

## 2021-07-23 NOTE — Progress Notes (Addendum)
  Progress Note    07/23/2021 7:49 AM 9 Days Post-Op  Subjective:  More alert today   Vitals:   07/22/21 2110 07/22/21 2331  BP: 102/72 112/70  Pulse: 93 95  Resp: 17 20  Temp: 98.6 F (37 C) 98.6 F (37 C)  SpO2: 100% 100%   Physical Exam: Lungs:  non labored Incisions:  no granulation tissue noted R groin; R AKA remains warm Extremities:  L AKA healed Abdomen:  soft Neurologic: more alert this morning  CBC    Component Value Date/Time   WBC 10.3 07/23/2021 0430   RBC 2.17 (L) 07/23/2021 0430   HGB 6.1 (LL) 07/23/2021 0430   HCT 20.2 (L) 07/23/2021 0430   PLT 291 07/23/2021 0430   MCV 93.1 07/23/2021 0430   MCH 28.1 07/23/2021 0430   MCHC 30.2 07/23/2021 0430   RDW 16.2 (H) 07/23/2021 0430   LYMPHSABS 1.2 07/20/2021 0546   MONOABS 0.8 07/20/2021 0546   EOSABS 0.1 07/20/2021 0546   BASOSABS 0.0 07/20/2021 0546    BMET    Component Value Date/Time   NA 144 07/23/2021 0430   K 4.5 07/23/2021 0430   CL 113 (H) 07/23/2021 0430   CO2 22 07/23/2021 0430   GLUCOSE 205 (H) 07/23/2021 0430   BUN 85 (H) 07/23/2021 0430   CREATININE 1.28 (H) 07/23/2021 0430   CALCIUM 8.6 (L) 07/23/2021 0430   GFRNONAA 59 (L) 07/23/2021 0430    INR    Component Value Date/Time   INR 1.2 06/30/2021 1608     Intake/Output Summary (Last 24 hours) at 07/23/2021 0749 Last data filed at 07/23/2021 5409 Gross per 24 hour  Intake 3296.16 ml  Output 701 ml  Net 2595.16 ml     Assessment/Plan:  75 y.o. male is s/p ligation of R EIA with AKA 9 Days Post-Op   R AKA warm, continue to monitor skin edges WOC consulted to replace wound vac R groin Cardiology addressing heart failure and new onset a fib  Plans noted to transfuse 1u pRBC TOC working on SNF placement; may need to reconsult palliative if no improvement is made   Emilie Rutter, PA-C [facility] [phone] 07/23/2021 7:49 AM   Plan vac change tomorrow with Dr. Gershon Mussel

## 2021-07-23 NOTE — Progress Notes (Signed)
Pharmacy Antibiotic Note  Todd Duran is a 75 y.o. male admitted on 06/27/2021 groin site infection with post-op Cx growing pseudomonas and currently on prolonged cefepime tx, little improvement.  Pharmacy has been consulted for vancomycin dosing.  Scr worse today  Plan: Decrease Vancomycin to 500 mg iv Q 24 hours Continue Cefepime 2g IV q 12h Monitor renal function, Cx and clinical progression to narrow Vancomycin levels as indicated  Height: 5\' 11"  (180.3 cm) Weight: 48.3 kg (106 lb 7.7 oz) IBW/kg (Calculated) : 75.3  Temp (24hrs), Avg:98.3 F (36.8 C), Min:97.5 F (36.4 C), Max:98.6 F (37 C)  Recent Labs  Lab 07/16/21 1100 07/17/21 0434 07/18/21 0445 07/20/21 0546 07/21/21 0808 07/21/21 1150 07/21/21 1432 07/23/21 0430  WBC  --  12.9* 11.0* 10.2 10.1  --   --  10.3  CREATININE 0.69 0.94  --  0.93 1.04  --   --  1.28*  LATICACIDVEN  --   --   --   --   --  2.2* 1.1  --      Estimated Creatinine Clearance: 35.1 mL/min (A) (by C-G formula based on SCr of 1.28 mg/dL (H)).    Allergies  Allergen Reactions   Lisinopril Swelling    Swelling of lips    Cefepime 8/25> (10/9)  9/13 BCx: sent 9/13 UCx: sent 8/23 wound (operative cx): rare Pseudomonas (pan-sensitive)  Thank you 9/23, PharmD 07/23/2021 7:30 AM

## 2021-07-23 NOTE — Consult Note (Addendum)
WOC Nurse Consult Note: Patient receiving care in [facility] [phone] Reason for Consult: Re-start vac to right groin Wound type: Surgical  Pressure Injury POA: NA Measurement: 7.2 x 5.2 x 2.4 with tunneling at 12 o'clock 2.8 depth.  Wound bed: 25% yellow fibrinous tissue 75% pink/red/white throughout.  Drainage (amount, consistency, odor) sanguinous, malodorous drainage on the dressing.  Periwound: Intact Dressing procedure/placement/frequency: Removed w/d dressing. Flushed the wound out with NS. Cleansed the surrounding skin with skin barrier wipes then outlined the wound with barrier rings to prevent leakage. Placed 1 piece of foam into the tunnel and 1 piece into the base of the wound. Drape applied, immediate suction obtained at 125 mmHg. Patient tolerated the procedure well.  M/W/F vac changes. WOC nurse will follow and see for dressing changes. Dr Lajoyce Corners to evaluate the wound tomorrow. Dressing will be changed again.  Please re-consult the WOC team if needed.  Renaldo Reel Katrinka Blazing, MSN, RN, CMSRN, Angus Seller, [facility] Wound Treatment Associate Pager [phone]

## 2021-07-23 NOTE — Progress Notes (Signed)
   Palliative Medicine Inpatient Follow Up Note   Chart Reviewed. Patient examined at the bedside.   Patient attempts to mumble responses. Alert to self only. No acute distress noted. Appears weak and ill-appearing. Cortrak in place. No family present.   Family and I had a scheduled meeting for today at 11am to further discuss goals of care in the setting on no meaningful recovery and continued decline. Wife Magda Paganini) and sister Mora Bellman) were to be in attendance as previously arranged by family. Writer called family at 1120 to follow-up given no family present. Unfortunately family shares they are not feeling well and had an ER visit overnight. Sister Mora Bellman) has tested positive for COVID-19. Wife does not have a confirm positive however has been exposed. They have requested to post-pone follow up meeting. I offered to continue meeting by virtual chat or conference call, however family declined for today. They request a conference call meeting in the upcoming days and plans to finalized a time. Wife will call back with designated time for Friday or Saturday.   Brief updates provided with concerns of patient's overall condition and high risk of further decline in the interim. Family verbalized understanding and wife confirms she will return call later once they have decided on the time for phone conference.   Discussed the importance of continued conversation with family and their  medical providers regarding overall plan of care and treatment options, ensuring decisions are within the context of the patients values and GOCs.  Questions and concerns addressed   Objective Assessment: Vital Signs Vitals:   07/23/21 1101 07/23/21 1140  BP: 106/64 116/67  Pulse: 74 80  Resp: (!) 23 (!) 21  Temp: 97.9 F (36.6 C) (!) 97.4 F (36.3 C)  SpO2: 100% 100%    Intake/Output Summary (Last 24 hours) at 07/23/2021 1330 Last data filed at 07/23/2021 1228 Gross per 24 hour  Intake 2042.16 ml  Output 700 ml   Net 1342.16 ml   Last Weight  Most recent update: 07/21/2021  4:26 AM    Weight  48.3 kg (106 lb 7.7 oz)            Gen:  NAD, cachectic, ill-appearing  HEENT: moist mucous membranes CV: RRR PULM: diminished bilaterally  ABD: soft/nontender/nondistended/normal bowel sounds, Cortrak in place  EXT: R AKA, L AKA  Neuro: Alert to self only, mumbles   SUMMARY OF RECOMMENDATIONS    -Patient is somnolent. Alert to self only. Ill-appearing. Family meeting cancelled due to family members COVID +. Wife is planning a phone conference and will call with time.  -Poor prognosis, showing no meaningful recovery  -PMT will continue to support and follow   Time Total: 35 min.   Visit consisted of counseling and education dealing with the complex and emotionally intense issues of symptom management and palliative care in the setting of serious and potentially life-threatening illness.Greater than 50%  of this time was spent counseling and coordinating care related to the above assessment and plan.  Willette Alma, AGPCNP-BC  Palliative Medicine Team [phone]  Palliative Medicine Team providers are available by phone from 7am to 7pm daily and can be reached through the team cell phone.  Should this patient require assistance outside of these hours, please call the patient's attending physician.

## 2021-07-24 LAB — GLUCOSE, CAPILLARY
Glucose-Capillary: 158 mg/dL — ABNORMAL HIGH (ref 70–99)
Glucose-Capillary: 155 mg/dL — ABNORMAL HIGH (ref 70–99)
Glucose-Capillary: 138 mg/dL — ABNORMAL HIGH (ref 70–99)
Glucose-Capillary: 158 mg/dL — ABNORMAL HIGH (ref 70–99)
Glucose-Capillary: 131 mg/dL — ABNORMAL HIGH (ref 70–99)

## 2021-07-24 LAB — CBC
HCT: 21.6 % — ABNORMAL LOW (ref 39.0–52.0)
Hemoglobin: 6.7 g/dL — CL (ref 13.0–17.0)
MCH: 28.4 pg (ref 26.0–34.0)
MCHC: 31 g/dL (ref 30.0–36.0)
MCV: 91.5 fL (ref 80.0–100.0)
Platelets: 263 10*3/uL (ref 150–400)
RBC: 2.36 MIL/uL — ABNORMAL LOW (ref 4.22–5.81)
RDW: 16.3 % — ABNORMAL HIGH (ref 11.5–15.5)
WBC: 9.7 10*3/uL (ref 4.0–10.5)
nRBC: 0 % (ref 0.0–0.2)

## 2021-07-24 LAB — BASIC METABOLIC PANEL
Anion gap: 9 (ref 5–15)
BUN: 100 mg/dL — ABNORMAL HIGH (ref 8–23)
CO2: 20 mmol/L — ABNORMAL LOW (ref 22–32)
Calcium: 8.7 mg/dL — ABNORMAL LOW (ref 8.9–10.3)
Chloride: 114 mmol/L — ABNORMAL HIGH (ref 98–111)
Creatinine, Ser: 1.55 mg/dL — ABNORMAL HIGH (ref 0.61–1.24)
GFR, Estimated: 47 mL/min — ABNORMAL LOW (ref >60)
Glucose, Bld: 175 mg/dL — ABNORMAL HIGH (ref 70–99)
Potassium: 4.8 mmol/L (ref 3.5–5.1)
Sodium: 143 mmol/L (ref 135–145)

## 2021-07-24 LAB — SARS CORONAVIRUS 2 (TAT 6-24 HRS): SARS Coronavirus 2: NEGATIVE

## 2021-07-24 LAB — PREPARE RBC (CROSSMATCH)

## 2021-07-24 LAB — OCCULT BLOOD X 1 CARD TO LAB, STOOL: Fecal Occult Bld: POSITIVE — AB

## 2021-07-24 MED ORDER — PANTOPRAZOLE INFUSION (NEW) - SIMPLE MED
8.0000 mg/h | INTRAVENOUS | Status: DC
  Administered 2021-07-24 – 2021-07-27 (×7): 8 mg/h via INTRAVENOUS
  Filled 2021-07-24 (×2): qty 100
  Filled 2021-07-24 (×5): qty 80
  Filled 2021-07-24: qty 100
  Filled 2021-07-24: qty 80
  Filled 2021-07-24: qty 100

## 2021-07-24 MED ORDER — LOSARTAN POTASSIUM 25 MG PO TABS
12.5000 mg | ORAL_TABLET | Freq: Every day | ORAL | Status: DC
  Administered 2021-07-24 – 2021-07-25 (×2): 12.5 mg via ORAL
  Filled 2021-07-24 (×2): qty 1

## 2021-07-24 MED ORDER — PANTOPRAZOLE 80MG IVPB - SIMPLE MED
80.0000 mg | Freq: Once | INTRAVENOUS | Status: AC
  Administered 2021-07-24: 80 mg via INTRAVENOUS
  Filled 2021-07-24: qty 80

## 2021-07-24 MED ORDER — PANTOPRAZOLE SODIUM 40 MG IV SOLR: 40.0000 mg | Freq: Two times a day (BID) | INTRAVENOUS | Status: DC

## 2021-07-24 MED ORDER — SODIUM CHLORIDE 0.9% IV SOLUTION: Freq: Once | INTRAVENOUS | Status: DC

## 2021-07-24 NOTE — Progress Notes (Signed)
Cardiology Progress Note  Patient ID: Todd Duran MRN: [id number] DOB: 1946/08/13 Date of Encounter: 07/24/2021  Primary Cardiologist: Parke Poisson, MD  Subjective   Chief Complaint: Confused  HPI: Confused this morning.  No complaints.  Maintaining sinus rhythm.  ROS:  All other ROS reviewed and negative. Pertinent positives noted in the HPI.     Inpatient Medications  Scheduled Meds:  amiodarone  400 mg Oral BID   Followed by   Melene Muller ON 07/30/2021] amiodarone  200 mg Oral Daily   aspirin  81 mg Per Tube Daily   atorvastatin  40 mg Per Tube QHS   carvedilol  3.125 mg Oral BID WC   Chlorhexidine Gluconate Cloth  6 each Topical Q0600   clopidogrel  75 mg Per Tube Daily   dapagliflozin propanediol  10 mg Oral Daily   digoxin  0.125 mg Oral Daily   docusate  100 mg Per Tube BID   feeding supplement (PROSource TF)  45 mL Per Tube TID   gabapentin  100 mg Oral TID   heparin injection (subcutaneous)  5,000 Units Subcutaneous Q8H   insulin aspart  0-9 Units Subcutaneous TID WC   insulin detemir  10 Units Subcutaneous BID   losartan  12.5 mg Oral Daily   mouth rinse  15 mL Mouth Rinse BID   methocarbamol  750 mg Per Tube QID   multivitamin with minerals  1 tablet Per Tube Daily   pantoprazole sodium  40 mg Per Tube Daily   polyethylene glycol  17 g Per Tube Daily   sennosides  10 mL Per Tube QHS   sodium chloride flush  10-40 mL Intracatheter Q12H   Continuous Infusions:  sodium chloride Stopped (07/23/21 0423)   ceFEPime (MAXIPIME) IV Stopped (07/24/21 0051)   feeding supplement (OSMOLITE 1.5 CAL) 1,000 mL (07/23/21 2259)   magnesium sulfate bolus IVPB     vancomycin Stopped (07/23/21 1750)   PRN Meds: sodium chloride, acetaminophen **OR** acetaminophen, alum & mag hydroxide-simeth, magnesium sulfate bolus IVPB, morphine injection, ondansetron, phenol, sodium chloride flush, traMADol   Vital Signs   Vitals:   07/23/21 2353 07/24/21 0335 07/24/21 0556 07/24/21  0854  BP: (!) 141/75 99/67  119/71  Pulse: 80 74  77  Resp: 20 20  18   Temp: 98.2 F (36.8 C) 98.2 F (36.8 C)  98.2 F (36.8 C)  TempSrc: Oral Oral  Oral  SpO2: 94% 95%  96%  Weight:   48.7 kg   Height:        Intake/Output Summary (Last 24 hours) at 07/24/2021 1127 Last data filed at 07/24/2021 0629 Gross per 24 hour  Intake 1601.83 ml  Output 670 ml  Net 931.83 ml   Last 3 Weights 07/24/2021 07/21/2021 07/20/2021  Weight (lbs) 107 lb 5.8 oz 106 lb 7.7 oz 103 lb 13.4 oz  Weight (kg) 48.7 kg 48.3 kg 47.1 kg      Telemetry  Overnight telemetry shows sinus rhythm heart rate in the 70s, which I personally reviewed.   Physical Exam   Vitals:   07/23/21 2353 07/24/21 0335 07/24/21 0556 07/24/21 0854  BP: (!) 141/75 99/67  119/71  Pulse: 80 74  77  Resp: 20 20  18   Temp: 98.2 F (36.8 C) 98.2 F (36.8 C)  98.2 F (36.8 C)  TempSrc: Oral Oral  Oral  SpO2: 94% 95%  96%  Weight:   48.7 kg   Height:        Intake/Output Summary (Last  24 hours) at 07/24/2021 1127 Last data filed at 07/24/2021 0629 Gross per 24 hour  Intake 1601.83 ml  Output 670 ml  Net 931.83 ml    Last 3 Weights 07/24/2021 07/21/2021 07/20/2021  Weight (lbs) 107 lb 5.8 oz 106 lb 7.7 oz 103 lb 13.4 oz  Weight (kg) 48.7 kg 48.3 kg 47.1 kg    Body mass index is 14.97 kg/m.  General: Well nourished, well developed, in no acute distress Head: Atraumatic, normal size  Eyes: PEERLA, EOMI  Neck: Supple, no JVD Endocrine: No thryomegaly Cardiac: Normal S1, S2; RRR; no murmurs, rubs, or gallops Lungs: Diminished breath sounds bilaterally Abd: Soft, nontender, no hepatomegaly  Ext: Status post bilateral leg amputations Skin: Warm and dry, no rashes   Neuro: Alert, awake, oriented to person and place  Labs  High Sensitivity Troponin:   Recent Labs  Lab 07/05/21 2052 07/05/21 2253 07/06/21 0202 07/06/21 0631  TROPONINIHS 126* 192* 265* 265*     Cardiac EnzymesNo results for input(s): TROPONINI in the  last 168 hours. No results for input(s): TROPIPOC in the last 168 hours.  Chemistry Recent Labs  Lab 07/20/21 0546 07/21/21 0808 07/23/21 0430  NA 142 144 144  K 4.2 4.3 4.5  CL 114* 111 113*  CO2 22 25 22   GLUCOSE 111* 142* 205*  BUN 54* 61* 85*  CREATININE 0.93 1.04 1.28*  CALCIUM 8.7* 8.9 8.6*  PROT 6.4*  --   --   ALBUMIN 1.2*  --   --   AST 15  --   --   ALT 12  --   --   ALKPHOS 101  --   --   BILITOT 0.7  --   --   GFRNONAA >60 >60 59*  ANIONGAP 6 8 9     Hematology Recent Labs  Lab 07/20/21 0546 07/21/21 0808 07/23/21 0430 07/23/21 1857 07/23/21 1917  WBC 10.2 10.1 10.3  --   --   RBC 2.67* 2.67* 2.17* 2.49*  --   HGB 7.4* 7.3* 6.1*  --  7.3*  HCT 24.1* 24.3* 20.2*  --  23.7*  MCV 90.3 91.0 93.1  --   --   MCH 27.7 27.3 28.1  --   --   MCHC 30.7 30.0 30.2  --   --   RDW 15.7* 15.9* 16.2*  --   --   PLT 318 310 291  --   --    BNPNo results for input(s): BNP, PROBNP in the last 168 hours.  DDimer No results for input(s): DDIMER in the last 168 hours.   Radiology  No results found.  Cardiac Studies  TTE 07/12/2021   1. Left ventricular ejection fraction, by estimation, is 35 to 40%. The  left ventricle has moderately decreased function. Left ventricular  endocardial border not optimally defined to evaluate regional wall motion.   2. Right ventricular systolic function is normal. The right ventricular  size is normal. There is normal pulmonary artery systolic pressure. The  estimated right ventricular systolic pressure is 32.8 mmHg.   3. The mitral valve is abnormal. Moderate mitral valve regurgitation.   4. Tricuspid valve regurgitation is mild to moderate.   5. The aortic valve is abnormal. There is moderate calcification of the  aortic valve. Aortic valve regurgitation is trivial. Mild aortic valve  sclerosis is present, with no evidence of aortic valve stenosis.   Patient Profile  75 year old male with history of diabetes, hypertension, bilateral  BKA, right femoral pseudoaneurysm, postop groin  infection, anemia who was admitted initially for complications related to right BKA.  Cardiology was consulted for new onset systolic heart failure and atrial arrhythmias.  Cardiology has been reconsulted on 07/21/2021 for A. fib with RVR as well as atrial flutter.  Assessment & Plan   #New onset atrial fibrillation/flutter -Developed on 07/21/2021. -Started on amiodarone due to low blood pressure.  Maintaining sinus rhythm.  Continue with amiodarone 400 mg twice daily for 7 days and then 200 mg daily. -Continue digoxin.  BP is well controlled. -Hypotension was likely related to anemia.  He is occultly positive for blood in his stool. -No anticoagulation.  Not a candidate right now.  #New onset systolic heart failure, 35-40% -Bedridden with bilateral leg amputations.  Not a candidate for aggressive cardiovascular care. -BP seems to be stable.  Continue Coreg 3.125 mg twice daily.  Placed on digoxin 0.125 mg daily.  We will check a BMP today. -Given soft BP we will not start Entresto.  Start losartan 12.5 mg daily. -No signs of volume overload.  Can be given Lasix as needed. -Seems primary team is moving towards goals of care discussion and possibly hospice care.  #Anemia -Thought to be related to oozing from wound.  Also with Hemoccult positive stools. -No anticoagulation right now. -He is on DAPT.  We will assess his knee with vascular surgery.  For questions or updates, please contact [facility] Please consult [email or web address] for contact info under   Time Spent with Patient: I have spent a total of 35 minutes with patient reviewing hospital notes, telemetry, EKGs, labs and examining the patient as well as establishing an assessment and plan that was discussed with the patient.  > 50% of time was spent in direct patient care.    Signed, Lenna Gilford. Flora Lipps, MD, [facility]  [facility]  07/24/2021 11:27 AM

## 2021-07-24 NOTE — Progress Notes (Signed)
SLP Cancellation Note  Patient Details Name: Todd Duran MRN: [id number] DOB: November 08, 1946   Cancelled treatment:       Reason Eval/Treat Not Completed: Medical issues which prohibited therapy (Pt's case discussed with Kathlene November, RN who recommended that treatment be deferred to allow blood transfusion and improvement in mentation. SLP will follow up next week.)  [name] [name], MS, CCC-SLP Acute Rehabilitation Services Office number [phone] Pager [phone]  [name] 07/24/2021, 3:40 PM

## 2021-07-24 NOTE — Progress Notes (Signed)
[facility]  Subjective  - Appears comfortable   Objective 99/67 74 98.2 F (36.8 C) (Oral) 20 95%  Intake/Output Summary (Last 24 hours) at 07/24/2021 0704 Last data filed at 07/24/2021 3570 Gross per 24 hour  Intake 1901.83 ml  Output 670 ml  Net 1231.83 ml   Lungs non labored breathing Wound vac change planned for today Left AKA healed, right AKA warm Abdomin soft NTTP   Assessment/Planning: 75 y.o. male is s/p ligation of R EIA with AKA 10 Days Post-Op   Plan for Dr. Lajoyce Corners and Dr. Myra Gianotti to do vac change and discuss options for care.  may need to reconsult palliative if no improvement is made  Mosetta Pigeon 07/24/2021 7:04 AM --  Laboratory Lab Results: Recent Labs    07/21/21 0808 07/23/21 0430 07/23/21 1917  WBC 10.1 10.3  --   HGB 7.3* 6.1* 7.3*  HCT 24.3* 20.2* 23.7*  PLT 310 291  --    BMET Recent Labs    07/21/21 0808 07/23/21 0430  NA 144 144  K 4.3 4.5  CL 111 113*  CO2 25 22  GLUCOSE 142* 205*  BUN 61* 85*  CREATININE 1.04 1.28*  CALCIUM 8.9 8.6*    COAG Lab Results  Component Value Date   INR 1.2 06/30/2021   No results found for: PTT

## 2021-07-24 NOTE — Progress Notes (Signed)
PT Cancellation Note  Patient Details Name: Todd Duran MRN: [id number] DOB: [birth year]   Cancelled Treatment:    Reason Eval/Treat Not Completed: Pt continues with impaired mentation affecting ability to participate in PT treatment session, also awaiting blood transfusion, per RN. Will follow-up for PT treatment on Monday pending goals of care meeting with family and palliative medicine.   Ina Homes, PT, DPT Acute Rehabilitation Services  Pager [phone] Office [phone]  Malachy Chamber 07/24/2021, 3:08 PM

## 2021-07-24 NOTE — Progress Notes (Addendum)
PROGRESS NOTE    Todd Duran   HBZ:[id number]  DOB: 10-16-1946  DOA: 06/27/2021 PCP: Pcp, No   Brief Narrative:  Todd Duran is an 75 y.o. male with past medical history of diabetes mellitus type 2, essential hypertension, bilateral BKA , on 05/10/2021 patient received left BKA, came back on 06/05/2021 for right toe amputation right and femoral to popliteal artery bypass grafting which was done on 06/10/2021.  On 06/20/2021 patient presented with an infected right foot and on 06/21/2021 had a right BKA.  On 06/23/2021 was sent to CIR, 06/27/2021 vascular surgery was consulted for for bleeding from the groin site and a dropping hemoglobin to 6.9.  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  On 06/30/2021 revision of the right femoral retroperitoneal and right iliac artery exposure, with a redo of the right femoral artery with endarterectomy and placement of multiple Blodgett sutures for hemostasis.  Postop culture grew Pseudomonas, which was treated with IV cefepime.  On 07/03/2021 he had an acute bleeding from his right groin with wound VAC output greater than 500 mL.  Right groin exploration and evacuation was required of the right groin hematoma.  On 07/04/2021 patient was taken back to the OR for reexploration of the right groin with ligation of the right femoropopliteal vein bypass and ligation of the right external iliac artery, excision of the right bovine pericardial patch and excision of right common femoral artery and negative pressure drain to the right groin.  Admitted to the ICU requiring mechanical ventilation subsequently extubated however he developed fluid overload for which she required BiPAP for few days.  Cardiology was consulted as he appears to be newly diagnosed with an EF of 30% and global hypokinesia. There was a mild elevation in troponins  thought to be demand ischemia.Patient was placed on IV diuretics with good response and has been weaned to room air.    Significant events: 8/27: right groin re-exploration and evacuation of hematoma, very friable vessels, ultimately opted for ligation.  8/28 more comfortable. No signs of bleeding.  But ended up on BIPAP; 2/2 edema seemed to respond to edema  8/29 adding back home meds. Palliative consulted by primary team.  Echocardiogram: Ejection fraction 30 to 35%, global hypokinesis, LV mildly dilated.  Diastolic parameters consistent with grade 2 diastolic dysfunction.  RV size normal, RV function normal.  Lactate elevated at 4 8/30: Quiet evening.  Blood pressure under control.  Lactate cleared to 0.7 bedside and assessment of lower limb, seemed a little bit warmer on exam.  No distress. Overnight patient got SOB. Pulm edema given lasix and placed on bipap 8/31: patient got sob with pulm edema. Placed on bipap for a few hours and lasix given. 9/6: Plan is for revision of the right BKA     Subjective: No complaints. Today he is aware that he is a the hospital.     Assessment & Plan:   Principal Problem:   Acute hypercapnic respiratory failure Acute systolic heart failure (new diagnosis) - required BiPAP  -2D echo reveals an EF of 35 to 40% - Has been adequately diuresed - Started on Entresto, carvedilol, aspirin, Plavix and statin however due to hypotension, medications being adjusted by cardiology  Active Problems:   Peripheral artery disease-bilateral BKA -9/6 - underwent right AKA for nonhealing right BKA -History of ligation of external iliac artery for bleeding - hold Aspirin &  Plavix  Acute blood loss anemia/ melena -noted to have melenotic stool on exam during my rounds today- patient was not aware he was sitting in stool- stool is heme + - will give 2 U PRBC now - he has required 6 units PRBC so far - start Protonix infusion - stop tube feeds and blood  thinners  Right groin pseudomonal infection - Receiving cefepime (started 8/25) via PICC line (placed 8/28) with plans of a 6-week course -Also receiving vancomycin - Blood cultures negative  Atrial fibrillation with RVR (new diagnosis) - Initiated on IV amiodarone due to concommitment hypotension - Cardiology planning to hold off on anticoagulation in setting of acute blood loss anemia  Moderate protein calorie malnutrition - Core track placed on 9/9-continue tube feeds  Diabetes mellitus type 2 - Last A1c was 5.2 - Continue sliding scale insulin  Pressure Injury 06/05/21 Coccyx Mid;Other (Comment) Stage 2 -  Partial thickness loss of dermis presenting as a shallow open injury with a red, pink wound bed without slough. (Active)  06/05/21 2200  Location: Coccyx  Location Orientation: Mid;Other (Comment) (coccyx)  Staging: Stage 2 -  Partial thickness loss of dermis presenting as a shallow open injury with a red, pink wound bed without slough.  Wound Description (Comments):   Present on Admission: Yes      Disposition: unfortunately, it appears that his prognosis is poor- continue palliative discussions 9/16>  have called his wife and explained in length the severity of his condition and his poor prognosis  Time spent in minutes: 35 DVT prophylaxis: heparin injection 5,000 Units Start: 07/15/21 1400  Code Status: DNR Family Communication: with his wife Todd Duran Level of Care: Level of care: Progressive Disposition Plan:  Status is: Inpatient  Remains inpatient appropriate because:IV treatments appropriate due to intensity of illness or inability to take PO  Dispo: The patient is from: Home              Anticipated d/c is to:  TBD              Patient currently is not medically stable to d/c.   Difficult to place patient Yes   Procedures:  Right AKA-Dr. Edilia Bo Antimicrobials:  Anti-infectives (From admission, onward)    Start     Dose/Rate Route Frequency  Ordered Stop   07/23/21 1500  vancomycin (VANCOREADY) IVPB 500 mg/100 mL        500 mg 100 mL/hr over 60 Minutes Intravenous Every 24 hours 07/23/21 0729     07/22/21 1500  vancomycin (VANCOREADY) IVPB 750 mg/150 mL  Status:  Discontinued        750 mg 150 mL/hr over 60 Minutes Intravenous Every 24 hours 07/21/21 1340 07/23/21 0729   07/21/21 1430  vancomycin (VANCOCIN) IVPB 1000 mg/200 mL premix        1,000 mg 200 mL/hr over 60 Minutes Intravenous  Once 07/21/21 1340 07/21/21 1647   07/02/21 1300  ceFEPIme (MAXIPIME) 2 g in sodium chloride 0.9 % 100 mL IVPB        2 g 200 mL/hr over 30 Minutes Intravenous Every 12 hours 07/02/21 1201 08/16/21 1259   06/28/21 0200  ceFAZolin (ANCEF) IVPB 2g/100 mL premix        2 g 200 mL/hr over 30 Minutes Intravenous Every 8 hours 06/27/21 2320 06/28/21 1002        Objective: Vitals:   07/23/21 2353 07/24/21 0335 07/24/21 0556 07/24/21 0854  BP: (!) 141/75 99/67  119/71  Pulse:  80 74  77  Resp: 20 20  18   Temp: 98.2 F (36.8 C) 98.2 F (36.8 C)  98.2 F (36.8 C)  TempSrc: Oral Oral  Oral  SpO2: 94% 95%  96%  Weight:   48.7 kg   Height:        Intake/Output Summary (Last 24 hours) at 07/24/2021 1319 Last data filed at 07/24/2021 07/26/2021 Gross per 24 hour  Intake 1601.83 ml  Output 670 ml  Net 931.83 ml    Filed Weights   07/20/21 0408 07/21/21 0329 07/24/21 0556  Weight: 47.1 kg 48.3 kg 48.7 kg    Examination: General exam: Appears comfortable  HEENT: PERRL - no sclera icterus or thrush- NG tube present Respiratory system: Clear to auscultation. Respiratory effort normal. Cardiovascular system: S1 & S2 heard, IIRR Gastrointestinal system: Abdomen soft, non-tender, nondistended. Normal bowel sounds   Central nervous system: Alert and oriented to person and place today. No focal neurological deficits. Extremities: No cyanosis, clubbing- right AKA with wound vac and left BKA      Data Reviewed: I have personally reviewed  following labs and imaging studies  CBC: Recent Labs  Lab 07/18/21 0445 07/20/21 0546 07/21/21 0808 07/23/21 0430 07/23/21 1917  WBC 11.0* 10.2 10.1 10.3  --   NEUTROABS  --  8.0*  --   --   --   HGB 8.5* 7.4* 7.3* 6.1* 7.3*  HCT 27.4* 24.1* 24.3* 20.2* 23.7*  MCV 89.5 90.3 91.0 93.1  --   PLT 379 318 310 291  --     Basic Metabolic Panel: Recent Labs  Lab 07/18/21 0445 07/18/21 1700 07/19/21 0542 07/19/21 1638 07/20/21 0546 07/21/21 0808 07/23/21 0430  NA  --   --   --   --  142 144 144  K  --   --   --   --  4.2 4.3 4.5  CL  --   --   --   --  114* 111 113*  CO2  --   --   --   --  22 25 22   GLUCOSE  --   --   --   --  111* 142* 205*  BUN  --   --   --   --  54* 61* 85*  CREATININE  --   --   --   --  0.93 1.04 1.28*  CALCIUM  --   --   --   --  8.7* 8.9 8.6*  MG 1.8 2.0 1.8 2.0 2.0 2.4  --   PHOS 3.7 3.2 2.7 2.7 3.2  --   --     GFR: Estimated Creatinine Clearance: 35.4 mL/min (A) (by C-G formula based on SCr of 1.28 mg/dL (H)). Liver Function Tests: Recent Labs  Lab 07/20/21 0546  AST 15  ALT 12  ALKPHOS 101  BILITOT 0.7  PROT 6.4*  ALBUMIN 1.2*    No results for input(s): LIPASE, AMYLASE in the last 168 hours. No results for input(s): AMMONIA in the last 168 hours. Coagulation Profile: No results for input(s): INR, PROTIME in the last 168 hours. Cardiac Enzymes: No results for input(s): CKTOTAL, CKMB, CKMBINDEX, TROPONINI in the last 168 hours. BNP (last 3 results) No results for input(s): PROBNP in the last 8760 hours. HbA1C: No results for input(s): HGBA1C in the last 72 hours.  CBG: Recent Labs  Lab 07/23/21 2025 07/23/21 2356 07/24/21 0339 07/24/21 0853 07/24/21 1155  GLUCAP 105* 124* 158* 155* 138*  Lipid Profile: No results for input(s): CHOL, HDL, LDLCALC, TRIG, CHOLHDL, LDLDIRECT in the last 72 hours. Thyroid Function Tests: No results for input(s): TSH, T4TOTAL, FREET4, T3FREE, THYROIDAB in the last 72 hours. Anemia  Panel: Recent Labs    07/23/21 1812 07/23/21 1857  VITAMINB12 509  --   FOLATE 12.4  --   FERRITIN 1,125*  --   TIBC 150*  --   IRON 19*  --   RETICCTPCT  --  1.8   Urine analysis:    Component Value Date/Time   COLORURINE YELLOW 05/16/2021 1551   APPEARANCEUR CLOUDY (A) 05/16/2021 1551   LABSPEC 1.014 05/16/2021 1551   PHURINE 5.0 05/16/2021 1551   GLUCOSEU NEGATIVE 05/16/2021 1551   HGBUR NEGATIVE 05/16/2021 1551   BILIRUBINUR NEGATIVE 05/16/2021 1551   KETONESUR NEGATIVE 05/16/2021 1551   PROTEINUR 30 (A) 05/16/2021 1551   NITRITE NEGATIVE 05/16/2021 1551   LEUKOCYTESUR LARGE (A) 05/16/2021 1551   Sepsis Labs: @LABRCNTIP (procalcitonin:4,lacticidven:4) ) Recent Results (from the past 240 hour(s))  Urine Culture     Status: Abnormal   Collection Time: 07/21/21 11:42 AM   Specimen: Urine, Clean Catch  Result Value Ref Range Status   Specimen Description URINE, CLEAN CATCH  Final   Special Requests   Final    NONE Performed at [facility], [address], [place], [place]    Culture 80,000 COLONIES/mL YEAST (A)  Final   Report Status 07/22/2021 FINAL  Final  Culture, blood (Routine X 2) w Reflex to ID Panel     Status: None (Preliminary result)   Collection Time: 07/21/21 12:10 PM   Specimen: BLOOD  Result Value Ref Range Status   Specimen Description BLOOD LEFT ANTECUBITAL  Final   Special Requests   Final    BOTTLES DRAWN AEROBIC AND ANAEROBIC Blood Culture adequate volume   Culture   Final    NO GROWTH 3 DAYS Performed at [facility], [address] [place], [place]    Report Status PENDING  Incomplete  Culture, blood (Routine X 2) w Reflex to ID Panel     Status: None (Preliminary result)   Collection Time: 07/21/21 12:10 PM   Specimen: BLOOD LEFT FOREARM  Result Value Ref Range Status   Specimen Description BLOOD LEFT FOREARM  Final   Special Requests   Final    BOTTLES DRAWN AEROBIC AND ANAEROBIC Blood Culture  adequate volume   Culture   Final    NO GROWTH 3 DAYS Performed at [facility], [address] [place], [place]    Report Status PENDING  Incomplete  SARS CORONAVIRUS 2 (TAT 6-24 HRS) Nasopharyngeal Nasopharyngeal Swab     Status: None   Collection Time: 07/24/21  4:27 AM   Specimen: Nasopharyngeal Swab  Result Value Ref Range Status   SARS Coronavirus 2 NEGATIVE NEGATIVE Final    Comment: (NOTE) SARS-CoV-2 target nucleic acids are NOT DETECTED.  The SARS-CoV-2 RNA is generally detectable in upper and lower respiratory specimens during the acute phase of infection. Negative results do not preclude SARS-CoV-2 infection, do not rule out co-infections with other pathogens, and should not be used as the sole basis for treatment or other patient management decisions. Negative results must be combined with clinical observations, patient history, and epidemiological information. The expected result is Negative.  Fact Sheet for Patients: 07/26/21  Fact Sheet for Healthcare Providers: HairSlick.no  This test is not yet approved or cleared by the [employer]ir[employer]  States FDA and  has been authorized for detection and/or diagnosis of SARS-CoV-2 by FDA under an Emergency Use Authorization (EUA). This EUA will remain  in effect (meaning this test can be used) for the duration of the COVID-19 declaration under Se ction 564(b)(1) of the Act, 21 U.S.C. section 360bbb-3(b)(1), unless the authorization is terminated or revoked sooner.  Performed at [facility], [address], [place], [place] [address]          Radiology Studies: No results found.    Scheduled Meds:  amiodarone  400 mg Oral BID   Followed by   Melene Muller ON 07/30/2021] amiodarone  200 mg Oral Daily   atorvastatin  40 mg Per Tube QHS   carvedilol  3.125 mg Oral BID WC   Chlorhexidine Gluconate Cloth  6 each Topical Q0600   dapagliflozin  propanediol  10 mg Oral Daily   digoxin  0.125 mg Oral Daily   docusate  100 mg Per Tube BID   feeding supplement (PROSource TF)  45 mL Per Tube TID   gabapentin  100 mg Oral TID   insulin aspart  0-9 Units Subcutaneous TID WC   losartan  12.5 mg Oral Daily   mouth rinse  15 mL Mouth Rinse BID   methocarbamol  750 mg Per Tube QID   multivitamin with minerals  1 tablet Per Tube Daily   [START ON 07/28/2021] pantoprazole  40 mg Intravenous Q12H   pantoprazole sodium  40 mg Per Tube Daily   sodium chloride flush  10-40 mL Intracatheter Q12H   Continuous Infusions:  sodium chloride Stopped (07/23/21 0423)   ceFEPime (MAXIPIME) IV Stopped (07/24/21 0051)   magnesium sulfate bolus IVPB     pantoprazole     pantoprazole     vancomycin Stopped (07/23/21 1750)     LOS: 27 days      Calvert Cantor, MD [facility] Pager: [email or web address] 07/24/2021, 1:19 PM

## 2021-07-25 DIAGNOSIS — Z515 Encounter for palliative care: Secondary | ICD-10-CM

## 2021-07-25 DIAGNOSIS — Z7189 Other specified counseling: Secondary | ICD-10-CM

## 2021-07-25 DIAGNOSIS — E440 Moderate protein-calorie malnutrition: Secondary | ICD-10-CM

## 2021-07-25 DIAGNOSIS — Z66 Do not resuscitate: Secondary | ICD-10-CM

## 2021-07-25 LAB — BASIC METABOLIC PANEL
Anion gap: 10 (ref 5–15)
BUN: 109 mg/dL — ABNORMAL HIGH (ref 8–23)
CO2: 21 mmol/L — ABNORMAL LOW (ref 22–32)
Calcium: 9.2 mg/dL (ref 8.9–10.3)
Chloride: 114 mmol/L — ABNORMAL HIGH (ref 98–111)
Creatinine, Ser: 1.65 mg/dL — ABNORMAL HIGH (ref 0.61–1.24)
GFR, Estimated: 44 mL/min — ABNORMAL LOW (ref >60)
Glucose, Bld: 135 mg/dL — ABNORMAL HIGH (ref 70–99)
Potassium: 5.1 mmol/L (ref 3.5–5.1)
Sodium: 145 mmol/L (ref 135–145)

## 2021-07-25 LAB — TYPE AND SCREEN
ABO/RH(D): O POS
Antibody Screen: NEGATIVE
Unit division: 0
Unit division: 0
Unit division: 0

## 2021-07-25 LAB — BPAM RBC
Blood Product Expiration Date: 202209232359
Blood Product Expiration Date: 202210162359
Blood Product Expiration Date: 202210172359
ISSUE DATE / TIME: 202209151109
ISSUE DATE / TIME: 202209161600
ISSUE DATE / TIME: 202209161821
Unit Type and Rh: 5100
Unit Type and Rh: 5100
Unit Type and Rh: 5100

## 2021-07-25 LAB — CBC
HCT: 26.9 % — ABNORMAL LOW (ref 39.0–52.0)
Hemoglobin: 8.3 g/dL — ABNORMAL LOW (ref 13.0–17.0)
MCH: 28.6 pg (ref 26.0–34.0)
MCHC: 30.9 g/dL (ref 30.0–36.0)
MCV: 92.8 fL (ref 80.0–100.0)
Platelets: 274 10*3/uL (ref 150–400)
RBC: 2.9 MIL/uL — ABNORMAL LOW (ref 4.22–5.81)
RDW: 16 % — ABNORMAL HIGH (ref 11.5–15.5)
WBC: 9.9 10*3/uL (ref 4.0–10.5)
nRBC: 0 % (ref 0.0–0.2)

## 2021-07-25 LAB — GLUCOSE, CAPILLARY
Glucose-Capillary: 129 mg/dL — ABNORMAL HIGH (ref 70–99)
Glucose-Capillary: 153 mg/dL — ABNORMAL HIGH (ref 70–99)
Glucose-Capillary: 154 mg/dL — ABNORMAL HIGH (ref 70–99)

## 2021-07-25 MED ORDER — POLYVINYL ALCOHOL 1.4 % OP SOLN
1.0000 [drp] | Freq: Four times a day (QID) | OPHTHALMIC | Status: DC | PRN
  Filled 2021-07-25: qty 15

## 2021-07-25 MED ORDER — MORPHINE SULFATE (PF) 2 MG/ML IV SOLN
1.0000 mg | INTRAVENOUS | Status: DC | PRN
  Administered 2021-07-25 – 2021-07-26 (×2): 1 mg via INTRAVENOUS
  Filled 2021-07-25 (×2): qty 1

## 2021-07-25 MED ORDER — LORAZEPAM 2 MG/ML IJ SOLN
0.5000 mg | Freq: Four times a day (QID) | INTRAMUSCULAR | Status: DC | PRN
  Administered 2021-07-26: 0.5 mg via INTRAVENOUS
  Filled 2021-07-25: qty 1

## 2021-07-25 MED ORDER — GABAPENTIN 250 MG/5ML PO SOLN
100.0000 mg | Freq: Three times a day (TID) | ORAL | Status: DC
  Administered 2021-07-25 – 2021-07-27 (×5): 100 mg
  Filled 2021-07-25 (×7): qty 2

## 2021-07-25 MED ORDER — AMIODARONE HCL 200 MG PO TABS
400.0000 mg | ORAL_TABLET | Freq: Two times a day (BID) | ORAL | Status: DC
  Administered 2021-07-25 – 2021-07-27 (×4): 400 mg
  Filled 2021-07-25 (×4): qty 2

## 2021-07-25 MED ORDER — BIOTENE DRY MOUTH MT LIQD: 15.0000 mL | OROMUCOSAL | Status: DC | PRN

## 2021-07-25 MED ORDER — HALOPERIDOL LACTATE 5 MG/ML IJ SOLN: 0.5000 mg | INTRAMUSCULAR | Status: DC | PRN

## 2021-07-25 MED ORDER — GLYCOPYRROLATE 0.2 MG/ML IJ SOLN: 0.2000 mg | INTRAMUSCULAR | Status: DC | PRN

## 2021-07-25 MED ORDER — AMIODARONE HCL 200 MG PO TABS: 200.0000 mg | ORAL_TABLET | Freq: Every day | ORAL | Status: DC

## 2021-07-25 NOTE — Progress Notes (Signed)
PROGRESS NOTE    Todd Duran   QIW:[id number]  DOB: 1946-11-02  DOA: 06/27/2021 PCP: Pcp, No   Brief Narrative:  Todd Duran is an 75 y.o. male with past medical history of diabetes mellitus type 2, essential hypertension, bilateral BKA , on 05/10/2021 patient received left BKA, came back on 06/05/2021 for right toe amputation right and femoral to popliteal artery bypass grafting which was done on 06/10/2021.  On 06/20/2021 patient presented with an infected right foot and on 06/21/2021 had a right BKA.  On 06/23/2021 was sent to CIR, 06/27/2021 vascular surgery was consulted for for bleeding from the groin site and a dropping hemoglobin to 6.9.  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  On 06/30/2021 revision of the right femoral retroperitoneal and right iliac artery exposure, with a redo of the right femoral artery with endarterectomy and placement of multiple Blodgett sutures for hemostasis.  Postop culture grew Pseudomonas, which was treated with IV cefepime.  On 07/03/2021 he had an acute bleeding from his right groin with wound VAC output greater than 500 mL.  Right groin exploration and evacuation was required of the right groin hematoma.  On 07/04/2021 patient was taken back to the OR for reexploration of the right groin with ligation of the right femoropopliteal vein bypass and ligation of the right external iliac artery, excision of the right bovine pericardial patch and excision of right common femoral artery and negative pressure drain to the right groin.  Admitted to the ICU requiring mechanical ventilation subsequently extubated however he developed fluid overload for which she required BiPAP for few days.  Cardiology was consulted as he appears to be newly diagnosed with an EF of 30% and global hypokinesia. There was a mild elevation in troponins  thought to be demand ischemia.Patient was placed on IV diuretics with good response and has been weaned to room air.    Significant events: 8/27: right groin re-exploration and evacuation of hematoma, very friable vessels, ultimately opted for ligation.  8/28 more comfortable. No signs of bleeding.  But ended up on BIPAP; 2/2 edema seemed to respond to edema  8/29 adding back home meds. Palliative consulted by primary team.  Echocardiogram: Ejection fraction 30 to 35%, global hypokinesis, LV mildly dilated.  Diastolic parameters consistent with grade 2 diastolic dysfunction.  RV size normal, RV function normal.  Lactate elevated at 4 8/30: Quiet evening.  Blood pressure under control.  Lactate cleared to 0.7 bedside and assessment of lower limb, seemed a little bit warmer on exam.  No distress. Overnight patient got SOB. Pulm edema given lasix and placed on bipap 8/31: patient got sob with pulm edema. Placed on bipap for a few hours and lasix given. 9/6: Plan is for revision of the right BKA     Subjective: He had no complaints for me this AM.    Assessment & Plan:   Principal Problem:   Acute hypercapnic respiratory failure Acute systolic heart failure (new diagnosis) - required BiPAP  -2D echo reveals an EF of 35 to 40% - Has been adequately diuresed - Started on Entresto, carvedilol, aspirin, Plavix and statin    Active Problems:   Peripheral artery disease-bilateral BKA -9/6 - underwent right AKA for nonhealing right BKA -History of ligation of external iliac artery for bleeding - holding Aspirin & Plavix  Acute blood loss anemia/ melena -noted to have melenotic  stool on exam during my rounds today- patient was not aware he was sitting in stool- stool is heme + - will give 2 U PRBC now - he has required 6 units PRBC so far - start Protonix infusion - stopped tube feeds and blood thinners on 9/16  Right groin pseudomonal infection - Receiving cefepime (started 8/25) via  PICC line (placed 8/28) with plans of a 6-week course -Also receiving vancomycin - Blood cultures negative  Atrial fibrillation with RVR (new diagnosis) - Initiated on IV amiodarone due to concommitment hypotension - Cardiology planning to hold off on anticoagulation in setting of acute blood loss anemia  Moderate protein calorie malnutrition - Core track placed on 9/9-continue tube feeds  Diabetes mellitus type 2 - Last A1c was 5.2 - Continue sliding scale insulin  Pressure Injury 06/05/21 Coccyx Mid;Other (Comment) Stage 2 -  Partial thickness loss of dermis presenting as a shallow open injury with a red, pink wound bed without slough. (Active)  06/05/21 2200  Location: Coccyx  Location Orientation: Mid;Other (Comment) (coccyx)  Staging: Stage 2 -  Partial thickness loss of dermis presenting as a shallow open injury with a red, pink wound bed without slough.  Wound Description (Comments):   Present on Admission: Yes      Disposition: unfortunately, it appears that his prognosis is poor- continue palliative discussions 9/16>  have called his wife and explained in length the severity of his condition and his poor prognosis 9/17> The patient has been transitioned to comfort care today. [facility] will sign off. Thank you for allowing [place] to participate in the care of this patient.   Procedures:  Right AKA-Dr. Edilia Bo Antimicrobials:  Anti-infectives (From admission, onward)    Start     Dose/Rate Route Frequency Ordered Stop   07/23/21 1500  vancomycin (VANCOREADY) IVPB 500 mg/100 mL  Status:  Discontinued        500 mg 100 mL/hr over 60 Minutes Intravenous Every 24 hours 07/23/21 0729 07/25/21 1149   07/22/21 1500  vancomycin (VANCOREADY) IVPB 750 mg/150 mL  Status:  Discontinued        750 mg 150 mL/hr over 60 Minutes Intravenous Every 24 hours 07/21/21 1340 07/23/21 0729   07/21/21 1430  vancomycin (VANCOCIN) IVPB 1000 mg/200 mL premix        1,000 mg 200 mL/hr  over 60 Minutes Intravenous  Once 07/21/21 1340 07/21/21 1647   07/02/21 1300  ceFEPIme (MAXIPIME) 2 g in sodium chloride 0.9 % 100 mL IVPB  Status:  Discontinued        2 g 200 mL/hr over 30 Minutes Intravenous Every 12 hours 07/02/21 1201 07/25/21 1149   06/28/21 0200  ceFAZolin (ANCEF) IVPB 2g/100 mL premix        2 g 200 mL/hr over 30 Minutes Intravenous Every 8 hours 06/27/21 2320 06/28/21 1002        Objective: Vitals:   07/25/21 0600 07/25/21 0840 07/25/21 0848 07/25/21 1200  BP:  130/72 130/71 128/77  Pulse:  76 79 77  Resp:   16 16  Temp:   98.3 F (36.8 C) 98 F (36.7 C)  TempSrc:   Oral Oral  SpO2:   95% 95%  Weight: 50.4 kg     Height:        Intake/Output Summary (Last 24 hours) at 07/25/2021 1521 Last data filed at 07/25/2021 0536 Gross per 24 hour  Intake 325 ml  Output 810 ml  Net -485 ml    Filed  Weights   07/21/21 0329 07/24/21 0556 07/25/21 0600  Weight: 48.3 kg 48.7 kg 50.4 kg    Examination: General exam: Appears comfortable  HEENT: PERRL - no sclera icterus or thrush- NG tube present Respiratory system: Clear to auscultation. Respiratory effort normal. Cardiovascular system: S1 & S2 heard, IIRR Gastrointestinal system: Abdomen soft, non-tender, nondistended. Normal bowel sounds   Central nervous system: Alert and oriented to person and place today. No focal neurological deficits. Extremities: No cyanosis, clubbing- right AKA with wound vac and left BKA      Data Reviewed: I have personally reviewed following labs and imaging studies  CBC: Recent Labs  Lab 07/20/21 0546 07/21/21 0808 07/23/21 0430 07/23/21 1917 07/24/21 1316 07/25/21 0205  WBC 10.2 10.1 10.3  --  9.7 9.9  NEUTROABS 8.0*  --   --   --   --   --   HGB 7.4* 7.3* 6.1* 7.3* 6.7* 8.3*  HCT 24.1* 24.3* 20.2* 23.7* 21.6* 26.9*  MCV 90.3 91.0 93.1  --  91.5 92.8  PLT 318 310 291  --  263 274    Basic Metabolic Panel: Recent Labs  Lab 07/18/21 1700 07/19/21 0542  07/19/21 1638 07/20/21 0546 07/21/21 0808 07/23/21 0430 07/24/21 1316 07/25/21 0205  NA  --   --   --  142 144 144 143 145  K  --   --   --  4.2 4.3 4.5 4.8 5.1  CL  --   --   --  114* 111 113* 114* 114*  CO2  --   --   --  22 25 22  20* 21*  GLUCOSE  --   --   --  111* 142* 205* 175* 135*  BUN  --   --   --  54* 61* 85* 100* 109*  CREATININE  --   --   --  0.93 1.04 1.28* 1.55* 1.65*  CALCIUM  --   --   --  8.7* 8.9 8.6* 8.7* 9.2  MG 2.0 1.8 2.0 2.0 2.4  --   --   --   PHOS 3.2 2.7 2.7 3.2  --   --   --   --     GFR: Estimated Creatinine Clearance: 28.4 mL/min (A) (by C-G formula based on SCr of 1.65 mg/dL (H)). Liver Function Tests: Recent Labs  Lab 07/20/21 0546  AST 15  ALT 12  ALKPHOS 101  BILITOT 0.7  PROT 6.4*  ALBUMIN 1.2*    No results for input(s): LIPASE, AMYLASE in the last 168 hours. No results for input(s): AMMONIA in the last 168 hours. Coagulation Profile: No results for input(s): INR, PROTIME in the last 168 hours. Cardiac Enzymes: No results for input(s): CKTOTAL, CKMB, CKMBINDEX, TROPONINI in the last 168 hours. BNP (last 3 results) No results for input(s): PROBNP in the last 8760 hours. HbA1C: No results for input(s): HGBA1C in the last 72 hours.  CBG: Recent Labs  Lab 07/24/21 1557 07/24/21 2112 07/25/21 0024 07/25/21 0508 07/25/21 0848  GLUCAP 158* 131* 129* 153* 154*    Lipid Profile: No results for input(s): CHOL, HDL, LDLCALC, TRIG, CHOLHDL, LDLDIRECT in the last 72 hours. Thyroid Function Tests: No results for input(s): TSH, T4TOTAL, FREET4, T3FREE, THYROIDAB in the last 72 hours. Anemia Panel: Recent Labs    07/23/21 1812 07/23/21 1857  VITAMINB12 509  --   FOLATE 12.4  --   FERRITIN 1,125*  --   TIBC 150*  --   IRON 19*  --  RETICCTPCT  --  1.8    Urine analysis:    Component Value Date/Time   COLORURINE YELLOW 05/16/2021 1551   APPEARANCEUR CLOUDY (A) 05/16/2021 1551   LABSPEC 1.014 05/16/2021 1551   PHURINE  5.0 05/16/2021 1551   GLUCOSEU NEGATIVE 05/16/2021 1551   HGBUR NEGATIVE 05/16/2021 1551   BILIRUBINUR NEGATIVE 05/16/2021 1551   KETONESUR NEGATIVE 05/16/2021 1551   PROTEINUR 30 (A) 05/16/2021 1551   NITRITE NEGATIVE 05/16/2021 1551   LEUKOCYTESUR LARGE (A) 05/16/2021 1551   Sepsis Labs: @LABRCNTIP (procalcitonin:4,lacticidven:4) ) Recent Results (from the past 240 hour(s))  Urine Culture     Status: Abnormal   Collection Time: 07/21/21 11:42 AM   Specimen: Urine, Clean Catch  Result Value Ref Range Status   Specimen Description URINE, CLEAN CATCH  Final   Special Requests   Final    NONE Performed at [facility], [address] [place], [place]    Culture 80,000 COLONIES/mL YEAST (A)  Final   Report Status 07/22/2021 FINAL  Final  Culture, blood (Routine X 2) w Reflex to ID Panel     Status: None (Preliminary result)   Collection Time: 07/21/21 12:10 PM   Specimen: BLOOD  Result Value Ref Range Status   Specimen Description BLOOD LEFT ANTECUBITAL  Final   Special Requests   Final    BOTTLES DRAWN AEROBIC AND ANAEROBIC Blood Culture adequate volume   Culture   Final    NO GROWTH 4 DAYS Performed at [facility], [address], [place], [place]    Report Status PENDING  Incomplete  Culture, blood (Routine X 2) w Reflex to ID Panel     Status: None (Preliminary result)   Collection Time: 07/21/21 12:10 PM   Specimen: BLOOD LEFT FOREARM  Result Value Ref Range Status   Specimen Description BLOOD LEFT FOREARM  Final   Special Requests   Final    BOTTLES DRAWN AEROBIC AND ANAEROBIC Blood Culture adequate volume   Culture   Final    NO GROWTH 4 DAYS Performed at [facility], [address] [place], [place]    Report Status PENDING  Incomplete  SARS CORONAVIRUS 2 (TAT 6-24 HRS) Nasopharyngeal Nasopharyngeal Swab     Status: None   Collection Time: 07/24/21  4:27 AM   Specimen: Nasopharyngeal Swab  Result Value Ref Range  Status   SARS Coronavirus 2 NEGATIVE NEGATIVE Final    Comment: (NOTE) SARS-CoV-2 target nucleic acids are NOT DETECTED.  The SARS-CoV-2 RNA is generally detectable in upper and lower respiratory specimens during the acute phase of infection. Negative results do not preclude SARS-CoV-2 infection, do not rule out co-infections with other pathogens, and should not be used as the sole basis for treatment or other patient management decisions. Negative results must be combined with clinical observations, patient history, and epidemiological information. The expected result is Negative.  Fact Sheet for Patients: 07/26/21  Fact Sheet for Healthcare Providers: HairSlick.no  This test is not yet approved or cleared by the quierodirigir.com FDA and  has been authorized for detection and/or diagnosis of SARS-CoV-2 by FDA under an Emergency Use Authorization (EUA). This EUA will remain  in effect (meaning this test can be used) for the duration of the COVID-19 declaration under Se ction 564(b)(1) of the Act, 21 U.S.C. section 360bbb-3(b)(1), unless the authorization is terminated or revoked sooner.  Performed at [facility], [address] [place], [place]  Radiology Studies: No results found.    Scheduled Meds:  amiodarone  400 mg Oral BID   Followed by   Melene Muller ON 07/30/2021] amiodarone  200 mg Oral Daily   docusate  100 mg Per Tube BID   gabapentin  100 mg Oral TID   mouth rinse  15 mL Mouth Rinse BID   methocarbamol  750 mg Per Tube QID   sodium chloride flush  10-40 mL Intracatheter Q12H   Continuous Infusions:  sodium chloride Stopped (07/23/21 0423)   pantoprazole 8 mg/hr (07/25/21 1359)     LOS: 28 days      Calvert Cantor, MD [facility] Pager: [email or web address] 07/25/2021, 3:21 PM

## 2021-07-25 NOTE — Progress Notes (Signed)
Stables removed from RLQ. Steri strips applied. Patient tolerated removal of staples well. Foam Dressing and xeroform to sacrum changed and wound cleaned/assessed.

## 2021-07-25 NOTE — Progress Notes (Signed)
Cardiology Progress Note  Patient ID: Todd Duran MRN: [id number] DOB: 1946/01/24 Date of Encounter: 07/25/2021  Primary Cardiologist: Parke Poisson, MD  Subjective   Chief Complaint: None.    HPI: Confused this morning.  No symptoms reported.  Hemoglobin stable.  Received transfusion yesterday.  Cardiovascular standpoint he is stable.  Plans for palliative care discussion today.  ROS:  All other ROS reviewed and negative. Pertinent positives noted in the HPI.     Inpatient Medications  Scheduled Meds:  sodium chloride   Intravenous Once   amiodarone  400 mg Oral BID   Followed by   Melene Muller ON 07/30/2021] amiodarone  200 mg Oral Daily   atorvastatin  40 mg Per Tube QHS   carvedilol  3.125 mg Oral BID WC   Chlorhexidine Gluconate Cloth  6 each Topical Q0600   dapagliflozin propanediol  10 mg Oral Daily   docusate  100 mg Per Tube BID   feeding supplement (PROSource TF)  45 mL Per Tube TID   gabapentin  100 mg Oral TID   insulin aspart  0-9 Units Subcutaneous TID WC   losartan  12.5 mg Oral Daily   mouth rinse  15 mL Mouth Rinse BID   methocarbamol  750 mg Per Tube QID   multivitamin with minerals  1 tablet Per Tube Daily   [START ON 07/28/2021] pantoprazole  40 mg Intravenous Q12H   pantoprazole sodium  40 mg Per Tube Daily   sodium chloride flush  10-40 mL Intracatheter Q12H   Continuous Infusions:  sodium chloride Stopped (07/23/21 0423)   ceFEPime (MAXIPIME) IV 2 g (07/25/21 0159)   magnesium sulfate bolus IVPB     pantoprazole 8 mg/hr (07/25/21 0158)   vancomycin 500 mg (07/24/21 1716)   PRN Meds: sodium chloride, acetaminophen **OR** acetaminophen, alum & mag hydroxide-simeth, magnesium sulfate bolus IVPB, morphine injection, ondansetron, phenol, sodium chloride flush, traMADol   Vital Signs   Vitals:   07/25/21 0400 07/25/21 0600 07/25/21 0840 07/25/21 0848  BP: 126/72  130/72 130/71  Pulse: 81  76 79  Resp: 20   16  Temp: 98.5 F (36.9 C)   98.3 F  (36.8 C)  TempSrc: Oral   Oral  SpO2: 97%   95%  Weight:  50.4 kg    Height:        Intake/Output Summary (Last 24 hours) at 07/25/2021 1058 Last data filed at 07/25/2021 0536 Gross per 24 hour  Intake 325 ml  Output 810 ml  Net -485 ml   Last 3 Weights 07/25/2021 07/24/2021 07/21/2021  Weight (lbs) 111 lb 1.8 oz 107 lb 5.8 oz 106 lb 7.7 oz  Weight (kg) 50.4 kg 48.7 kg 48.3 kg      Telemetry  Overnight telemetry shows sinus rhythm in the 70s, which I personally reviewed.   Physical Exam   Vitals:   07/25/21 0400 07/25/21 0600 07/25/21 0840 07/25/21 0848  BP: 126/72  130/72 130/71  Pulse: 81  76 79  Resp: 20   16  Temp: 98.5 F (36.9 C)   98.3 F (36.8 C)  TempSrc: Oral   Oral  SpO2: 97%   95%  Weight:  50.4 kg    Height:        Intake/Output Summary (Last 24 hours) at 07/25/2021 1058 Last data filed at 07/25/2021 0536 Gross per 24 hour  Intake 325 ml  Output 810 ml  Net -485 ml    Last 3 Weights 07/25/2021 07/24/2021 07/21/2021  Weight (  lbs) 111 lb 1.8 oz 107 lb 5.8 oz 106 lb 7.7 oz  Weight (kg) 50.4 kg 48.7 kg 48.3 kg    Body mass index is 15.5 kg/m.   General: Well nourished, well developed, in no acute distress Head: Atraumatic, normal size  Eyes: PEERLA, EOMI  Neck: Supple, JVD 7 8 cm of water Endocrine: No thryomegaly Cardiac: Normal S1, S2; RRR; no murmurs, rubs, or gallops Lungs: Diminished breath sounds bilaterally Abd: Soft, nontender, no hepatomegaly  Ext: Status post bilateral leg amputation Skin: Warm and dry, no rashes   Neuro: Alert, awake, oriented to self only  Labs  High Sensitivity Troponin:   Recent Labs  Lab 07/05/21 2052 07/05/21 2253 07/06/21 0202 07/06/21 0631  TROPONINIHS 126* 192* 265* 265*     Cardiac EnzymesNo results for input(s): TROPONINI in the last 168 hours. No results for input(s): TROPIPOC in the last 168 hours.  Chemistry Recent Labs  Lab 07/20/21 0546 07/21/21 0808 07/23/21 0430 07/24/21 1316 07/25/21 0205   NA 142   < > 144 143 145  K 4.2   < > 4.5 4.8 5.1  CL 114*   < > 113* 114* 114*  CO2 22   < > 22 20* 21*  GLUCOSE 111*   < > 205* 175* 135*  BUN 54*   < > 85* 100* 109*  CREATININE 0.93   < > 1.28* 1.55* 1.65*  CALCIUM 8.7*   < > 8.6* 8.7* 9.2  PROT 6.4*  --   --   --   --   ALBUMIN 1.2*  --   --   --   --   AST 15  --   --   --   --   ALT 12  --   --   --   --   ALKPHOS 101  --   --   --   --   BILITOT 0.7  --   --   --   --   GFRNONAA >60   < > 59* 47* 44*  ANIONGAP 6   < > 9 9 10    < > = values in this interval not displayed.    Hematology Recent Labs  Lab 07/23/21 0430 07/23/21 1857 07/23/21 1917 07/24/21 1316 07/25/21 0205  WBC 10.3  --   --  9.7 9.9  RBC 2.17* 2.49*  --  2.36* 2.90*  HGB 6.1*  --  7.3* 6.7* 8.3*  HCT 20.2*  --  23.7* 21.6* 26.9*  MCV 93.1  --   --  91.5 92.8  MCH 28.1  --   --  28.4 28.6  MCHC 30.2  --   --  31.0 30.9  RDW 16.2*  --   --  16.3* 16.0*  PLT 291  --   --  263 274   BNPNo results for input(s): BNP, PROBNP in the last 168 hours.  DDimer No results for input(s): DDIMER in the last 168 hours.   Radiology  No results found.  Cardiac Studies  TTE 07/12/2021  1. Left ventricular ejection fraction, by estimation, is 35 to 40%. The  left ventricle has moderately decreased function. Left ventricular  endocardial border not optimally defined to evaluate regional wall motion.   2. Right ventricular systolic function is normal. The right ventricular  size is normal. There is normal pulmonary artery systolic pressure. The  estimated right ventricular systolic pressure is 32.8 mmHg.   3. The mitral valve is abnormal. Moderate mitral valve regurgitation.  4. Tricuspid valve regurgitation is mild to moderate.   5. The aortic valve is abnormal. There is moderate calcification of the  aortic valve. Aortic valve regurgitation is trivial. Mild aortic valve  sclerosis is present, with no evidence of aortic valve stenosis.   Patient Profile   75 year old male with history of diabetes, hypertension, bilateral BKA, right femoral pseudoaneurysm, postop groin infection, anemia who was admitted initially for complications related to right BKA.  Cardiology was consulted for new onset systolic heart failure and atrial arrhythmias.  Cardiology has been reconsulted on 07/21/2021 for A. fib with RVR as well as atrial flutter.  Assessment & Plan   #New onset atrial fibrillation/flutter -Developed on 07/21/2021.  Due to hypotension was started on amiodarone.  Maintaining sinus rhythm. -We will continue with amiodarone load.  400 mg twice daily for 7 days and then 200 mg daily. -Digoxin has been stopped due to rising serum creatinine. -No anticoagulation.  Not a candidate. -Appears his potential GI bleed will not be worked up due to his overall poor state of health.  Palliative care discussion today.  #New onset systolic heart failure 35 to 40% -Bedridden with bilateral leg amputations.  Not a candidate for aggressive cardiovascular care. -Now with significant AKI.  We will hold his digoxin and losartan.  Also holding [place]. -Would recommend just continue Coreg for now. -From a cardiovascular standpoint have little off him.  He now has developed what appears to be a GI bleed.  -For now we will just recommend he continue Coreg.  His volume status is acceptable.  I believe he will be heading towards hospice.  Would recommend discussion with palliative care.  Cardiology will follow up tomorrow but likely will not offer any substantial recommendation.   #Anemia -Likely GI bleed. -No anticoagulation. -Stop his aspirin and Plavix.  For questions or updates, please contact [facility] Please consult [email or web address] for contact info under   Time Spent with Patient: I have spent a total of 25 minutes with patient reviewing hospital notes, telemetry, EKGs, labs and examining the patient as well as establishing an assessment and plan that was  discussed with the patient.  > 50% of time was spent in direct patient care.    Signed, Lenna Gilford. Flora Lipps, MD, [facility]  [facility]  07/25/2021 10:58 AM

## 2021-07-25 NOTE — Progress Notes (Signed)
   VASCULAR SURGERY ASSESSMENT & PLAN:   Todd Duran is a 75 y.o. 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   Right iliofemoral endarterectomy with iliac stenting and right fifth toe amputation 06/05/21.  Right femoral - popliteal artery bypass with GSV, 4/5 ray amputation 06/10/21. 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 Right femoral pseudoaneurysm exploration / repair 06/27/21, 06/30/21, 07/04/21 x2. 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   Now with: Malnutrition.  Cortrak in place,  tube feeding off currently.  Delirium>>persistent. NPO Acute systolic heart failure.  New onset atrial fib/flutter. Cardiology following. Anemia>>multifactorial. Plavix and aspirin discontinued. Ongoing requirement for transfusion. 2u given yesterday. Hgb 8.3 this morning. Pseudomonal infection of the right groin.  Remains afebrile. Continue IV antibiotics x 6 weeks. On cefepime and vanc PAD: Bilateral AKAs  DC RLQ staples  Disposition planning.  Palliative care consulted. Appreciate efforts with family/GOC. Pending further discussion ? today.   SUBJECTIVE:   Awake, alert, not oriented to place/situation  PHYSICAL EXAM:   Vitals:   07/25/21 0600 07/25/21 0840 07/25/21 0848 07/25/21 1200  BP:  130/72 130/71 128/77  Pulse:  76 79 77  Resp:   16 16  Temp:   98.3 F (36.8 C) 98 F (36.7 C)  TempSrc:   Oral Oral  SpO2:   95% 95%  Weight: 50.4 kg     Height:       General appearance: Awake, alert in no apparent distress Cardiac: Heart rate and rhythm are regular Respirations: Nonlabored Extremities: Right aka amputation site incision is well approximated without bleeding or hematoma.  Anterior and posterior flaps are warm and well-perfused. He has moderate edema of the residual limb. Soft to palpation Right groin VAC dressing changed: wound with pale colored granulation buds. No bleeding. Minimal VAC output. Wound measures 9 X 1.5 X 0.5 cm RLQ incision  well healed.     LABS:   Lab Results  Component Value Date   WBC 9.9 07/25/2021   HGB 8.3 (L) 07/25/2021   HCT 26.9 (L) 07/25/2021   MCV 92.8 07/25/2021   PLT 274 07/25/2021   Lab Results  Component Value Date   CREATININE 1.65 (H) 07/25/2021   Lab Results  Component Value Date   INR 1.2 06/30/2021   CBG (last 3)  Recent Labs    07/25/21 0024 07/25/21 0508 07/25/21 0848  GLUCAP 129* 153* 154*    PROBLEM LIST:    Principal Problem:   Acute hypercapnic respiratory failure (HCC) Active Problems:   Peripheral artery disease (HCC)   Pseudoaneurysm (HCC)   Encounter for postanesthesia care   Acute blood loss anemia   Pseudomonas infection   Wound infection after surgery   Malnutrition of moderate degree   Acute respiratory failure with hypoxia (HCC)   Pulmonary edema   CURRENT MEDS:    amiodarone  400 mg Oral BID   Followed by   Melene Muller ON 07/30/2021] amiodarone  200 mg Oral Daily   docusate  100 mg Per Tube BID   gabapentin  100 mg Oral TID   mouth rinse  15 mL Mouth Rinse BID   methocarbamol  750 mg Per Tube QID   sodium chloride flush  10-40 mL Intracatheter Q12H    Milinda Antis, [place]  Office: [phone] 07/25/2021

## 2021-07-25 NOTE — Progress Notes (Signed)
Palliative Medicine Inpatient Follow Up Note   Chart Reviewed and patient examined at the bedside. Todd Duran is resting but easily aroused. Is able to state his name however is not oriented to date of birth or place. Denies pain. When I ask him how he feels he states "I am just tired".   Wife, Todd Duran is at the bedside. I called patient's sister Todd Duran via speaker phone who also engage patient's other sister, Todd Duran. Updates provided to family. We discussed patient's minimal response to treatment modalities and his quality of life. Wife is tearful expressing her thoughts when patient says he is tired he is most likely meaning tired of all the treatments. We discussed previous goals of care discussions that was had when patient was alert and oriented. At that time he made it clear to family and myself he would not wish to be kept alive if he had no quality of life or did not improve. Family verbalized their remembrance of discussions. Todd Duran shares she feels patient is at the point that all efforts have been made and it is now time to focus on his comfort. Family tearful but all agreed.   Detailed discussions and education provided on comfort focused care while hospitalized and outside of the hospital. Medications for symptom management and discontinuation of medical interventions and medications not comfort focused was discussed. Further education provided on outpatient hospice support in the home versus hospice facility. Family mutually verbalized understanding confirming wishes for all care to focus on patient's comfort. They would like patient to be considered for residential hospice expressing [facility] as their choice of location. Education provided on the referral process to hospice in addition to expectations during remainder of hospitalization. Todd Duran is requesting patient's sister Todd Duran be the point of contact for any further discussions including hospice arrangements.    Questions addressed and  support provided.   Objective Assessment: Vital Signs Vitals:   07/25/21 0840 07/25/21 0848  BP: 130/72 130/71  Pulse: 76 79  Resp:  16  Temp:  98.3 F (36.8 C)  SpO2:  95%    Intake/Output Summary (Last 24 hours) at 07/25/2021 1145 Last data filed at 07/25/2021 0536 Gross per 24 hour  Intake 325 ml  Output 810 ml  Net -485 ml   Last Weight  Most recent update: 07/25/2021  6:02 AM    Weight  50.4 kg (111 lb 1.8 oz)            Gen:  NAD, cachectic, ill appearing  HEENT: moist mucous membranes, Cortrak in place (clamped) CV: Irregulary irregular  PULM: diminished bilaterally  ABD: soft/nontender/nondistended/normal bowel sounds EXT: No edema, bilateral AKA Neuro: confused, will not follow commands   SUMMARY OF RECOMMENDATIONS   DNR/DNI Family has made decision for all care to focus on comfort only. Request for residential hospice placement with [facility] their expressed location of choice. Sister Todd Duran will be point of contact. (TOC referral placed)  Morphine PRN for pain/air hunger/comfort Robinul PRN for excessive secretions Ativan PRN for agitation/anxiety Zofran PRN for nausea Liquifilm tears PRN for dry eyes Haldol PRN for agitation/anxiety May have comfort feeding Comfort cart for family Unrestricted visitations in the setting of EOL (per policy) Oxygen PRN 2L or less for comfort. No escalation.    Discussed with Dr. Butler Denmark, Dr. Flora Lipps, and vascular via secure chat. Marchelle Folks, RN also updated.   Time Total: 65 min.   Visit consisted of counseling and education dealing with the complex and emotionally intense issues of  symptom management and palliative care in the setting of serious and potentially life-threatening illness.Greater than 50%  of this time was spent counseling and coordinating care related to the above assessment and plan.  Willette Alma, AGPCNP-BC  Palliative Medicine Team [phone]  Palliative Medicine Team providers are  available by phone from 7am to 7pm daily and can be reached through the team cell phone.  Should this patient require assistance outside of these hours, please call the patient's attending physician.

## 2021-07-26 DIAGNOSIS — J9602 Acute respiratory failure with hypercapnia: Secondary | ICD-10-CM | POA: Diagnosis not present

## 2021-07-26 DIAGNOSIS — I739 Peripheral vascular disease, unspecified: Secondary | ICD-10-CM | POA: Diagnosis not present

## 2021-07-26 DIAGNOSIS — E440 Moderate protein-calorie malnutrition: Secondary | ICD-10-CM | POA: Diagnosis not present

## 2021-07-26 DIAGNOSIS — T8149XA Infection following a procedure, other surgical site, initial encounter: Secondary | ICD-10-CM | POA: Diagnosis not present

## 2021-07-26 LAB — GLUCOSE, CAPILLARY: Glucose-Capillary: 85 mg/dL (ref 70–99)

## 2021-07-26 LAB — CULTURE, BLOOD (ROUTINE X 2)
Culture: NO GROWTH
Culture: NO GROWTH
Special Requests: ADEQUATE
Special Requests: ADEQUATE

## 2021-07-26 NOTE — Progress Notes (Signed)
   07/26/21 1748  Clinical Encounter Type  Visited With Patient not available;Health care provider  Visit Type Initial;Patient actively dying  Referral From Palliative care team   Chaplain responded to a palliative care consult. Patient is resting and according to the RN is not very communicative. No family present at this time. Per RN, expect discharge to hospice tomorrow. Spiritual care services available as needed.   Alda Ponder, Chaplain

## 2021-07-26 NOTE — Progress Notes (Signed)
Daily Progress Note   Patient Name: Todd Duran       Date: 07/26/2021 DOB: [birth year]  Age: 75 y.o. MRN#: [id number] Attending Physician: Nada Libman, MD Primary Care Physician: Pcp, No Admit Date: 06/27/2021  Reason for Consultation/Follow-up: end of life care, symptom management  Subjective: Patient appears comfortable. He is alert and tells me he is doing "ok". He denies pain. Speech is mumbled and difficult to understand. He does not appear to be oriented to place or situation. No signs of pain or discomfort noted. Respirations are even and unlabored. No excessive respiratory secretions noted. No family present at bedside.    Spoke with bedside RN. She reports he has required prn doses of morphine and ativan earlier today. She inquires if cortrak can be removed. I will enter this order. She reports he ate some ice cream earlier. I have recommended crushing his oral medications in applesauce or ice cream after cortak is removed.   Additional time spent communicating and coordinating care regarding plan for discharge.   Length of Stay: 43   Physical Exam Vitals reviewed.  Constitutional:      General: He is not in acute distress.    Appearance: He is ill-appearing.  Pulmonary:     Effort: Pulmonary effort is normal.  Musculoskeletal:     Right Lower Extremity: Right leg is amputated above knee.     Left Lower Extremity: Left leg is amputated above knee.  Neurological:     Mental Status: He is alert.  Psychiatric:        Cognition and Memory: Cognition is impaired.            Vital Signs: BP 122/70 (BP Location: Left Arm)   Pulse 84   Temp 97.6 F (36.4 C) (Oral)   Resp 15   Ht 5\' 11"  (1.803 m)   Wt 50.4 kg   SpO2 100%   BMI 15.50 kg/m  SpO2: SpO2: 100 % O2  Device: O2 Device: Room Air O2 Flow Rate: O2 Flow Rate (L/min): 2 L/min       Palliative Assessment/Data: PPS 20%     Palliative Care Assessment & Plan   HPI/Patient Profile: Palliative Care consult requested for goals of care discussion in this 75 y.o. male  with past medical history of diabetes, hypertension, L AKA,  PAD, tobacco/alcohol use, and multiple right lower extremity surgeries. He underwent  L BKA on 05/13/2021, later requiring left AKA on 05/25/2021, and required R BKA 06/21/2021. Admitted to the ICU from CIR on 06/27/21 requiring mechanical ventilation. He was successfully extubated however he developed fluid overload and required BiPAP for few days. He was subsequently found to have a new diagnosis of systolic heart failure with an EF of 30%  Required Right AKA on 07/14/21 for nonhealing right BKA.   Assessment: - acute hypercapnic respiratory failure - acute systolic heart failure (new diagnosis) - peripheral artery disease, now with bilateral AKA - atrial fibrillation with RVR - moderate protein calorie malnutrition - end of life care  Recommendations/Plan: Continue comfort measures Remove cortrak PRN medications are available for symptom management at EOL Plan for transfer to [facility] tomorrow Gold DNR form signed and placed on shadow chart  Goals of Care and Additional Recommendations: Limitations on Scope of Treatment: Full Comfort Care  Code Status: DNR/DNI  Prognosis:  < 2 weeks  Discharge Planning: Hospice facility    Thank you for allowing the Palliative Medicine Team to assist in the care of this patient.   Total Time 25 minutes Prolonged Time Billed  no       Greater than 50%  of this time was spent counseling and coordinating care related to the above assessment and plan.  Merry Proud, NP  Please contact Palliative Medicine Team phone at [phone] for questions and concerns.

## 2021-07-26 NOTE — Progress Notes (Signed)
Patient is transition to hospice care.  Cardiology continued or stopped as deemed appropriate per hospice. Cardiology available as needed.   Gerri Spore T. Flora Lipps, MD, [facility]  [facility]  [address], [address] [place], [place] [address] [phone]  8:32 AM

## 2021-07-26 NOTE — Consult Note (Signed)
WOC Nurse Consult Note: Notified of plan to transfer to Comfort Care and to discontinue NPWT to right groin. Patient will be going to Hospice tomorrow.  VVS PA-C has entered orders for the above this afternoon.  WOC Nursing to sign off.  WOC nursing team will not follow, but will remain available to this patient, the nursing and medical teams.  Please re-consult if needed. Thanks, Ladona Mow, MSN, RN, GNP, [place]  Pager# [phone]

## 2021-07-26 NOTE — Progress Notes (Addendum)
   VASCULAR SURGERY ASSESSMENT & PLAN:   Todd Duran is a 75 y.o. 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   Right iliofemoral endarterectomy with iliac stenting and right fifth toe amputation 06/05/21.  Right femoral - popliteal artery bypass with GSV, 4/5 ray amputation 06/10/21. 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 Right femoral pseudoaneurysm exploration / repair 06/27/21, 06/30/21, 07/04/21 x2. 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   Multiple medical issues including: Malnutrition.  Delirium>>persistent. Acute systolic heart failure.  New onset atrial fib/flutter.  Anemia>>multifactorial.  Ongoing requirement for transfusion. Pseudomonal infection of the right groin.   PAD: Bilateral AKAs  Disposition planning.  Palliative care consulted. Patient transitioned to comfort care.  Family would like the patient to be considered for residential hospice care expressing interest in [facility] as their first choice.  VASCULAR STAFF ADDENDUM: I have independently interviewed and examined the patient. I agree with the above.   Fara Olden, MD [facility] Phone Number: [phone] 07/26/2021 1:24 PM     SUBJECTIVE:   Alert, remains confused.  PHYSICAL EXAM:   Vitals:   07/25/21 2000 07/25/21 2307 07/26/21 0600 07/26/21 0815  BP: 132/77 122/75 115/70 122/70  Pulse: 88 87 85 84  Resp: 15 16 15    Temp: 97.9 F (36.6 C) (!) 97.5 F (36.4 C) 97.8 F (36.6 C) 97.6 F (36.4 C)  TempSrc: Axillary Axillary Axillary Oral  SpO2: 98% 100% 100% 100%  Weight:      Height:       General appearance: Awake, alert in no apparent distress Cardiac: Heart rate and rhythm are regular Respirations: Nonlabored Extremities: Right aka amputation site incision is well approximated without bleeding or hematoma.  Anterior and posterior flaps are warm and well-perfused. He has moderate edema of the residual  limb. Soft to palpation. Left AKA well healed. Right groin VAC dressing changed yesterday. Will discontinue and place NS damp-to-dry dressings.  LABS:   Lab Results  Component Value Date   WBC 9.9 07/25/2021   HGB 8.3 (L) 07/25/2021   HCT 26.9 (L) 07/25/2021   MCV 92.8 07/25/2021   PLT 274 07/25/2021   Lab Results  Component Value Date   CREATININE 1.65 (H) 07/25/2021   Lab Results  Component Value Date   INR 1.2 06/30/2021   CBG (last 3)  Recent Labs    07/25/21 0024 07/25/21 0508 07/25/21 0848  GLUCAP 129* 153* 154*    PROBLEM LIST:    Principal Problem:   Acute hypercapnic respiratory failure (HCC) Active Problems:   Peripheral artery disease (HCC)   Pseudoaneurysm (HCC)   Encounter for postanesthesia care   Acute blood loss anemia   Pseudomonas infection   Wound infection after surgery   Malnutrition of moderate degree   Acute respiratory failure with hypoxia (HCC)   Pulmonary edema   CURRENT MEDS:    [START ON 07/30/2021] amiodarone  200 mg Per Tube Daily   Followed by   amiodarone  400 mg Per Tube BID   docusate  100 mg Per Tube BID   gabapentin  100 mg Per Tube TID   mouth rinse  15 mL Mouth Rinse BID   methocarbamol  750 mg Per Tube QID   sodium chloride flush  10-40 mL Intracatheter Q12H   08/01/2021, Milinda Antis  Office: [phone] 07/26/2021

## 2021-07-26 NOTE — Progress Notes (Signed)
Civil engineer, contracting [facility]) Hospital Liaison Note   Received request from Transitions of Care Manager Judd Lien, LCSW for family interest in [facility]. Visited patient at bedside and spoke with sister Carston Riedl to confirm interest and explain services. Patient chart and information under review by [facility] physician. [facility] eligibility confirmed.    Unfortunately, [facility] is not able to offer a room today. Family and Operating Room Services Manager aware hospital liaison will follow up tomorrow or sooner if a room becomes available.    Please do not hesitate to call with any hospice related questions.    Thank you for the opportunity to participate in this patient's care.   Bobbie "Einar Gip, RN, BSN [facility] Liaison [phone]

## 2021-07-26 NOTE — Social Work (Signed)
CSW addressed a consult for residential hospice to [facility] Per palliative note on 9/17 pt's wife is requesting that pt's sister Mora Bellman make the decisions in regards to hospice.     CSW spoke with Ruby and she confirmed that family wants pt to go to [facility]. CSW made a referral to the [employer] liaison for [facility]. They stated that they would follow up.

## 2021-07-27 NOTE — Progress Notes (Signed)
Civil engineer, contracting [facility]) Hospital Liaison Note:  The family has completed the [employer] paper work and is ready for transport to arrive as soon as possible. TOC aware and to arrange transport.   Bedside RN - Please Call report to [phone] prior to patient leaving the unit. Please leave in PICC line for transportation to [facility].  And please send signed and completed DNR with patient at time of discharge.  Thank you,  Roda Shutters, RN [facility] HLT [phone]

## 2021-07-27 NOTE — TOC Transition Note (Signed)
Transition of Care [facility]) - CM/SW Discharge Note   Patient Details  Name: Todd Duran MRN: [id number] Date of Birth: July 10, 1946  [facility]) CM/SW Contact:  Durenda Guthrie, RN Phone Number: 07/27/2021, 11:26 AM   Clinical Narrative:  Patient will discharge to [facility]. PTAR arranged for 12:30p transport. Bedside RN to call report to [phone]. Medical Necessity Form completed, patient's family has been notified y Cordelia Pen with [facility] of discharge plan.     Final next level of care: Hospice Medical Facility Barriers to Discharge: No Barriers Identified   Patient Goals and CMS Choice        Discharge Placement                       Discharge Plan and Services                                     Social Determinants of Health (SDOH) Interventions     Readmission Risk Interventions No flowsheet data found.

## 2021-07-27 NOTE — Progress Notes (Signed)
Patient discharged to [facility] with [facility]. He is alert. PICC line left in per directive from [facility]. Report called to RN accepting patient at [facility]. Pt. In no acute pain upon discharge. Personal belongings sent with PTAR. Brynda Rim, RN

## 2021-07-27 NOTE — Discharge Summary (Signed)
Discharge Summary     Todd Duran May 02, 1946 75 y.o. male  [id number]  Admission Date: 06/27/2021  Discharge Date: 07/27/2021  Physician: Nada Libman, MD  Admission Diagnosis: Pseudoaneurysm [facility]) [I72.9]  HPI:   This is a 75 y.o. male with a complex past vascular surgery history.  Prior to admission, he had undergone left above-the-knee amputation, right femoral artery endarterectomy with iliac stent placement secondary to right foot wound.  He then underwent right femoral to above-knee popliteal artery bypass with PTFE graft.  He ultimately required right below-knee amputation for nonhealing foot wound.  Hospital Course:  The patient was admitted to the hospital from inpatient rehabilitation and taken to the operating room on 06/27/2021.  He had developed a sudden onset of bleeding at the bedside in the inpatient rehabilitation center.  CT scan showed a large pseudoaneurysm in the right groin.  He was taken urgently to the operating room and underwent:  Retroperitoneal exposure of the right external iliac artery, exploration of right groin for bleeding and repair of bovine pericardial patch angioplasty.  Findings: The medial aspect of the bovine pericardial patch was disrupted in the midportion of the patch angioplasty site.  This was repaired with a running 5-0 Prolene suture.  There was no signs of infection.  The pt tolerated the procedure well and was transported to the PACU in good condition.   On August 23, he again developed significant bleeding from his right groin.  This required return to the operating room for revision of his right femoral endarterectomy via pledgeted sutures for hemostasis.  This was done through right iliac artery exposure and right femoral artery exposure.  A right femoral Prevena wound VAC was placed.  Intraoperative anaerobic and aerobic cultures were obtained from the groin incision.  Over the course of the next 3 days, he developed a  hematoma which required operative intervention.  He was bleeding from his patch and this was repaired primarily.  Ultimately several hours later, he had a recurrent bleed which required ligation of his artery and excision of the patch.  He required extensive transfusion. He was unable to be weaned off the ventilator postoperatively and was transferred to intensive care unit.  Critical care/pulmonary medicine was consulted for ventilator management.  His wound cultures were positive for Pseudomonas.  Sensitive to cefepime.  His right distal residual limb was noted to be cool to touch.  On August 28, he was noted to be tachycardic but was able to be extubated.  He required BiPAP.  His leukocytosis was improving.  The patient was counseled regarding the possibility of a more proximal amputation.  IV antibiotics continued.  Critical care service managed hypertension and heart failure.  On August 30, he developed shortness of breath requiring replacement of BiPAP.  He was treated for flash pulmonary edema.  The following day he was able to maintain his oxygen saturation on BiPAP.  On September 2, his vital signs were stable and his respiratory failure had improved.  Diuresis for pulmonary edema and heart failure continued.  He was able to transfer to the nursing floor.  Critical care medicine signed off and hospitalist service signed on.  His distal residual limb remained cool to touch.  Elective care consultation was obtained and the patient was placed in DO NOT RESUSCITATE status.  Ischemic changes were noted to right residual limb.  Antibiotics continued.  His right residual limb failed to heal and on September 6 he was taken to the operating room and  underwent right above-the-knee amputation.  The patient was not able to sustain oral nutrition and dietary consultation was obtained.  A core track was placed and he was started on tube feeds.  A wound VAC was placed to the right groin.  His right lower quadrant  incision was well approximated.  Inpatient rehabilitation evaluation was performed.  He did not meet criteria.  On July 20, 2021, he developed mild delirium.  Over the next several days this persisted.  Speech therapy evaluation was carried out and he was noted to be at risk for aspiration and was made NPO.  The following day telemetry revealed atrial fibrillation and atrial flutter with intermittent RVR.  Anticoagulation was held due to the fact the patient had been anemic postoperatively with hemoglobin trend downward.  Inspection of his right groin wound revealed very little granulation tissue.  Patient's delirium persisted.  His prognosis was deemed poor and palliative care discussions continued.  On September 18, family agreed to position to comfort care measures.  All providers were in agreement.  CBC    Component Value Date/Time   WBC 9.9 07/25/2021 0205   RBC 2.90 (L) 07/25/2021 0205   HGB 8.3 (L) 07/25/2021 0205   HCT 26.9 (L) 07/25/2021 0205   PLT 274 07/25/2021 0205   MCV 92.8 07/25/2021 0205   MCH 28.6 07/25/2021 0205   MCHC 30.9 07/25/2021 0205   RDW 16.0 (H) 07/25/2021 0205   LYMPHSABS 1.2 07/20/2021 0546   MONOABS 0.8 07/20/2021 0546   EOSABS 0.1 07/20/2021 0546   BASOSABS 0.0 07/20/2021 0546    BMET    Component Value Date/Time   NA 145 07/25/2021 0205   K 5.1 07/25/2021 0205   CL 114 (H) 07/25/2021 0205   CO2 21 (L) 07/25/2021 0205   GLUCOSE 135 (H) 07/25/2021 0205   BUN 109 (H) 07/25/2021 0205   CREATININE 1.65 (H) 07/25/2021 0205   CALCIUM 9.2 07/25/2021 0205   GFRNONAA 44 (L) 07/25/2021 0205     Discharge Instructions     Call MD for:  redness, tenderness, or signs of infection (pain, swelling, bleeding, redness, odor or green/yellow discharge around incision site)   Complete by: As directed    Call MD for:  severe or increased pain, loss or decreased feeling  in affected limb(s)   Complete by: As directed    Call MD for:  temperature >100.5    Complete by: As directed    Resume previous diet   Complete by: As directed        Discharge Diagnosis:  Pseudoaneurysm (HCC) [I72.9]  Secondary Diagnosis: Patient Active Problem List   Diagnosis Date Noted   Acute respiratory failure with hypoxia (HCC)    Pulmonary edema    Malnutrition of moderate degree 07/06/2021   Encounter for postanesthesia care 07/04/2021   Acute hypercapnic respiratory failure (HCC) 07/04/2021   Acute blood loss anemia 07/04/2021   Pseudomonas infection 07/04/2021   Wound infection after surgery 07/04/2021   Pseudoaneurysm (HCC) 06/27/2021   Pressure injury of skin 06/08/2021   Femoral artery occlusion (HCC) 06/05/2021   Peripheral artery disease (HCC) 06/05/2021   PVD (peripheral vascular disease) (HCC) 06/05/2021   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   aspirin 81 MG EC tablet Take 1 tablet (81 mg total) by mouth daily. Swallow whole.   atenolol 25 MG tablet Commonly known as: TENORMIN Take 1 tablet (25 mg total) by mouth 2 (two) times daily. What changed:  how much to take when to take this   atorvastatin 20 MG tablet Commonly known as: LIPITOR Take 20 mg by mouth at bedtime.   clopidogrel 75 MG tablet Commonly known as: PLAVIX Take 1 tablet (75 mg total) by mouth daily.   cyanocobalamin 1000 MCG tablet Take 1,000 mcg by mouth daily.   diclofenac Sodium 1 % Gel Commonly known as: VOLTAREN Apply 2 g topically 3 (three) times daily.   ferrous sulfate  325 (65 FE) MG tablet Take 1 tablet (325 mg total) by mouth daily with breakfast.   folic acid 1 MG tablet Commonly known as: FOLVITE Take 1 tablet (1 mg total) by mouth daily.   gabapentin 100 MG capsule Commonly known as: NEURONTIN Take 1 capsule (100 mg total) by mouth 2 (two) times daily. What changed: when to take this   hydrochlorothiazide 25 MG tablet Commonly known as: HYDRODIURIL Take 25 mg by mouth daily.   metFORMIN 500 MG tablet Commonly known as: GLUCOPHAGE Take 500 mg by mouth daily with breakfast.   methocarbamol 750 MG tablet Commonly known as: ROBAXIN Take 750 mg by mouth 4 (four) times daily.   multivitamin with minerals Tabs tablet Take 1 tablet by mouth daily.   oxybutynin 5 MG tablet Commonly known as: DITROPAN Take 0.5 tablets (2.5 mg total) by mouth 2 (two) times daily.   oxyCODONE-acetaminophen 5-325 MG tablet Commonly known as: PERCOCET/ROXICET Take 1-2 tablets by mouth every 4 (four) hours as needed for moderate pain.   polyethylene glycol 17 g packet Commonly known as: MIRALAX / GLYCOLAX Take 17 g by mouth daily as needed for mild constipation.        Discharge Instructions: [facility] Discharge instructions Lower Extremity Bypass Surgery  Please refer to the following instruction for your post-procedure care. Your surgeon or physician assistant will discuss any changes with you.  Activity  You are encouraged to walk as much as you can. You can slowly return to normal activities during the month after your surgery. Avoid strenuous activity and heavy lifting until your doctor tells you it's OK. Avoid activities such as vacuuming or swinging a golf club. Do not drive until your doctor give the OK and you are no longer taking prescription pain medications. It is also normal to have difficulty with sleep habits, eating and bowel movement after surgery. These will go away with time.  Bathing/Showering  You  may shower after you go home. Do not soak in a bathtub, hot tub, or swim until the incision heals completely.  Incision Care  Clean your incision with mild soap and water. Shower every day. Pat the area dry with a clean towel. You do not need a bandage unless otherwise instructed. Do not apply any ointments or creams to your incision. If you have open wounds you will be instructed how to care for them or a visiting nurse may be arranged for you. If you have staples or sutures along your incision they will  be removed at your post-op appointment. You may have skin glue on your incision. Do not peel it off. It will come off on its own in about one week.  Wash the groin wound with soap and water daily and pat dry. (No tub bath-only shower)  Then put a dry gauze or washcloth in the groin to keep this area dry to help prevent wound infection.  Do this daily and as needed.  Do not use Vaseline or neosporin on your incisions.  Only use soap and water on your incisions and then protect and keep dry.  Diet  Resume your normal diet. There are no special food restrictions following this procedure. A low fat/ low cholesterol diet is recommended for all patients with vascular disease. In order to heal from your surgery, it is CRITICAL to get adequate nutrition. Your body requires vitamins, minerals, and protein. Vegetables are the best source of vitamins and minerals. Vegetables also provide the perfect balance of protein. Processed food has little nutritional value, so try to avoid this.  Medications  Resume taking all your medications unless your doctor or Physician Assistant tells you not to. If your incision is causing pain, you may take over-the-counter pain relievers such as acetaminophen (Tylenol). If you were prescribed a stronger pain medication, please aware these medication can cause nausea and constipation. Prevent nausea by taking the medication with a snack or meal. Avoid constipation by drinking  plenty of fluids and eating foods with high amount of fiber, such as fruits, vegetables, and grains. Take Colace 100 mg (an over-the-counter stool softener) twice a day as needed for constipation.  Do not take Tylenol if you are taking prescription pain medications.  Follow Up  Our office will schedule a follow up appointment 2-3 weeks following discharge.  Please call us immediately for any of the following conditions  Severe or worsening pain in your legs or feet while at rest or while walking Increase pain, redness, warmth, or drainage (pus) from your incision site(s) Fever of 101 degree or higher The swelling in your leg with the bypass suddenly worsens and becomes more painful than when you were in the hospital If you have been instructed to feel your graft pulse then you should do so every day. If you can no longer feel this pulse, call the office immediately. Not all patients are given this instruction.  Leg swelling is common after leg bypass surgery.  The swelling should improve over a few months following surgery. To improve the swelling, you may elevate your legs above the level of your heart while you are sitting or resting. Your surgeon or physician assistant may ask you to apply an ACE wrap or wear compression (TED) stockings to help to reduce swelling.  Reduce your risk of vascular disease  Stop smoking. If you would like help call [facility] at 1-800-QUIT-NOW (281-840-3329) or [place] at [phone].  Manage your cholesterol Maintain a desired weight Control your diabetes weight Control your diabetes Keep your blood pressure down  If you have any questions, please call the office at [phone]   Prescriptions given: None Disposition: Residential hospice  Patient's condition: is Poor  Follow up: N/a   Wendi Maya, PA-C [facility] [phone] 07/27/2021  12:14 PM  - For VQI Registry use ---   Post-op:  Wound infection:  Yes  Graft infection: No  Transfusion: Yes    If yes, 14 units given New Arrhythmia: YesIpsilateral amputation: Yes, [ ]  Minor, [ ]  BKA, [  x] AKA Discharge patency: [ ]  Primary, [ ]  Primary assisted, [ ]  Secondary, [ ]  Occluded Patency judged by: [ ]  Dopper only, [ ]  Palpable graft pulse, [ Palpable distal pulse, [ ]  ABI inc. > 0.15, [ ]  Duplex Discharge ABI: R , L  D/C Ambulatory Status: Bedridden  Complications: MI: No, [ ]  Troponin only, [ ]  EKG or Clinical CHF: Yes Resp failure:Yes, [ ]  Pneumonia, [ ]  Ventilator Chg in renal function: Yes, [ x] Inc. Cr > 0.5, [ ]  Temp. Dialysis,  [ ]  Permanent dialysis Stroke: No, [ ]  Minor, [ ]  Major Return to OR: Yes  Reason for return to OR: [ ]  Bleeding, [ ]  Infection, [ ]  Thrombosis, [ ]  Revision  Discharge medications: Statin use:  no ASA use:  no Plavix use:  no Beta blocker use: no CCB use:  No ACEI use:   no ARB use:  no Coumadin use: no Comfort care measures

## 2021-07-27 NOTE — Progress Notes (Signed)
[facility] [place]  Subjective  - No new issues oevr night   Objective 128/81 96 97.6 F (36.4 C) (Oral) 16 100%  Intake/Output Summary (Last 24 hours) at 07/27/2021 0720 Last data filed at 07/27/2021 0352 Gross per 24 hour  Intake --  Output 1475 ml  Net -1475 ml       Assessment/Planning: Hospice care initiated Todd Duran is a 75 y.o. 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   Right iliofemoral endarterectomy with iliac stenting and right fifth toe amputation 06/05/21.  Right femoral - popliteal artery bypass with GSV, 4/5 ray amputation 06/10/21. 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 Right femoral pseudoaneurysm exploration / repair 06/27/21, 06/30/21, 07/04/21 x2. 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   Multiple medical issues including: Malnutrition.  Delirium>>persistent. Acute systolic heart failure.  New onset atrial fib/flutter.  Anemia>>multifactorial.  Ongoing requirement for transfusion. Pseudomonal infection of the right groin.   PAD: Bilateral AKAs   Disposition planning.  Palliative care consulted. Patient transitioned to comfort care.  Family would like the patient to be considered for residential hospice care. Mosetta Pigeon 07/27/2021 7:20 AM --  Laboratory Lab Results: Recent Labs    07/24/21 1316 07/25/21 0205  WBC 9.7 9.9  HGB 6.7* 8.3*  HCT 21.6* 26.9*  PLT 263 274   BMET Recent Labs    07/24/21 1316 07/25/21 0205  NA 143 145  K 4.8 5.1  CL 114* 114*  CO2 20* 21*  GLUCOSE 175* 135*  BUN 100* 109*  CREATININE 1.55* 1.65*  CALCIUM 8.7* 9.2    COAG Lab Results  Component Value Date   INR 1.2 06/30/2021   No results found for: PTT

## 2021-07-27 NOTE — Discharge Summary (Signed)
[facility] Discharge Summary   Patient ID:  Todd Duran MRN: [id number] DOB/AGE: [birth year] 75 y.o.  Admit date: 06/27/2021 Discharge date: 07/27/2021 Date of Surgery: 07/14/2021 Surgeon: Surgeon(s): Chuck Hint, MD Victorino Sparrow, MD  Admission Diagnosis: Pseudoaneurysm [facility]) [I72.9]  Discharge Diagnoses:  Pseudoaneurysm (HCC) [I72.9]  Secondary Diagnoses: Past Medical History:  Diagnosis Date   Diabetes mellitus without complication (HCC)    Hypertension     Procedure(s): AMPUTATION ABOVE KNEE APPLICATION OF WOUND VAC GROIN DEBRIDEMENT  Discharged Condition: critical comfort care  HPI:  This is a 75 year old gentleman who presented with a nonsalvageable ischemic left foot.  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  He has been up in rehab for his amputation.  He has developed a necrotic right fifth toe.   Hospital Course:  TION TSE is a 75 y.o. male is S/P Right Procedure(s): AMPUTATION ABOVE KNEE APPLICATION OF WOUND VAC GROIN DEBRIDEMENT Hospice care initiated Todd Duran is a 75 y.o. 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   Right iliofemoral endarterectomy with iliac stenting and right fifth toe amputation 06/05/21.  Right femoral - popliteal artery bypass with GSV, 4/5 ray amputation 06/10/21. 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 Right femoral pseudoaneurysm exploration / repair 06/27/21, 06/30/21, 07/04/21 x2. 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   Multiple medical issues including: Malnutrition.  Delirium>>persistent. Acute systolic heart failure.  New onset atrial fib/flutter.  Anemia>>multifactorial.  Ongoing  requirement for transfusion. Pseudomonal infection of the right groin.   PAD: Bilateral AKAs   Disposition planning.  Palliative care consulted. Patient transitioned to comfort care.  Family would like the patient to be considered for residential hospice care. Consults:  Treatment Team:  Nada Libman, MD Chuck Hint, MD  Significant Diagnostic Studies: CBC Lab Results  Component Value Date   WBC 9.9 07/25/2021   HGB 8.3 (L) 07/25/2021   HCT 26.9 (L) 07/25/2021   MCV 92.8 07/25/2021   PLT 274 07/25/2021    BMET    Component Value Date/Time   NA 145 07/25/2021 0205   K 5.1 07/25/2021 0205   CL 114 (H) 07/25/2021 0205   CO2 21 (L) 07/25/2021 0205   GLUCOSE 135 (H) 07/25/2021 0205   BUN 109 (H) 07/25/2021 0205   CREATININE 1.65 (H) 07/25/2021 0205   CALCIUM 9.2 07/25/2021 0205   GFRNONAA 44 (L) 07/25/2021 0205   COAG Lab Results  Component Value Date   INR 1.2 06/30/2021     Disposition:  Discharge to : Palliative/hospice care Discharge Instructions     Call MD for:  redness, tenderness, or signs of infection (pain, swelling, bleeding, redness, odor or green/yellow discharge around incision site)   Complete by: As directed    Call MD for:  severe or increased pain, loss or decreased feeling  in affected limb(s)   Complete by: As directed    Call MD for:  temperature >100.5   Complete by: As directed    Resume previous diet   Complete by: As directed       Allergies as of 07/27/2021       Reactions   Lisinopril Swelling   Swelling of lips        Medication  List     TAKE these medications    amLODipine 10 MG tablet Commonly known as: NORVASC Take 10 mg by mouth daily.   aspirin 81 MG EC tablet Take 1 tablet (81 mg total) by mouth daily. Swallow whole.   atenolol 25 MG tablet Commonly known as: TENORMIN Take 1 tablet (25 mg total) by mouth 2 (two) times daily. What changed:  how much to take when to take this   atorvastatin 20  MG tablet Commonly known as: LIPITOR Take 20 mg by mouth at bedtime.   clopidogrel 75 MG tablet Commonly known as: PLAVIX Take 1 tablet (75 mg total) by mouth daily.   cyanocobalamin 1000 MCG tablet Take 1,000 mcg by mouth daily.   diclofenac Sodium 1 % Gel Commonly known as: VOLTAREN Apply 2 g topically 3 (three) times daily.   ferrous sulfate 325 (65 FE) MG tablet Take 1 tablet (325 mg total) by mouth daily with breakfast.   folic acid 1 MG tablet Commonly known as: FOLVITE Take 1 tablet (1 mg total) by mouth daily.   gabapentin 100 MG capsule Commonly known as: NEURONTIN Take 1 capsule (100 mg total) by mouth 2 (two) times daily. What changed: when to take this   hydrochlorothiazide 25 MG tablet Commonly known as: HYDRODIURIL Take 25 mg by mouth daily.   metFORMIN 500 MG tablet Commonly known as: GLUCOPHAGE Take 500 mg by mouth daily with breakfast.   methocarbamol 750 MG tablet Commonly known as: ROBAXIN Take 750 mg by mouth 4 (four) times daily.   multivitamin with minerals Tabs tablet Take 1 tablet by mouth daily.   oxybutynin 5 MG tablet Commonly known as: DITROPAN Take 0.5 tablets (2.5 mg total) by mouth 2 (two) times daily.   oxyCODONE-acetaminophen 5-325 MG tablet Commonly known as: PERCOCET/ROXICET Take 1-2 tablets by mouth every 4 (four) hours as needed for moderate pain.   polyethylene glycol 17 g packet Commonly known as: MIRALAX / GLYCOLAX Take 17 g by mouth daily as needed for mild constipation.       Verbal and written Discharge instructions given to the patient. Wound care per Discharge AVS   Signed: Mosetta Pigeon 07/27/2021, 11:01 AM

## 2021-07-27 NOTE — Progress Notes (Signed)
Nutrition Brief Note  Chart reviewed. Pt now transitioning to comfort care.  No further nutrition interventions planned at this time.  Please re-consult as needed.   Thaison Kolodziejski MS, RD, LDN, CNSC Clinical Nutrition Pager listed in [facility]ION   

## 2021-08-08 DEATH — deceased

## 2022-04-02 IMAGING — RF DG FEMUR 2+V*R*
1 series · 15 of 20 positions shown · non-contrast
Comparison: None.

CLINICAL DATA: Insertion of right iliac and superficial femoral
artery stent.

EXAM:
DG C-ARM 1-60 MIN; PELVIS - 1-2 VIEW; RIGHT FEMUR 2 VIEWS
FLUOROSCOPY TIME:  Fluoroscopy Time:  31 minutes and 48 seconds
Radiation Exposure Index (if provided by the fluoroscopic device):
113 mGy
Number of Acquired Spot Images: 0
Number of acquired cine clips: 5

[Series 1: run · 0.20mm/px · 5 acquisitions, 15 frames shown]
[im 1/5]
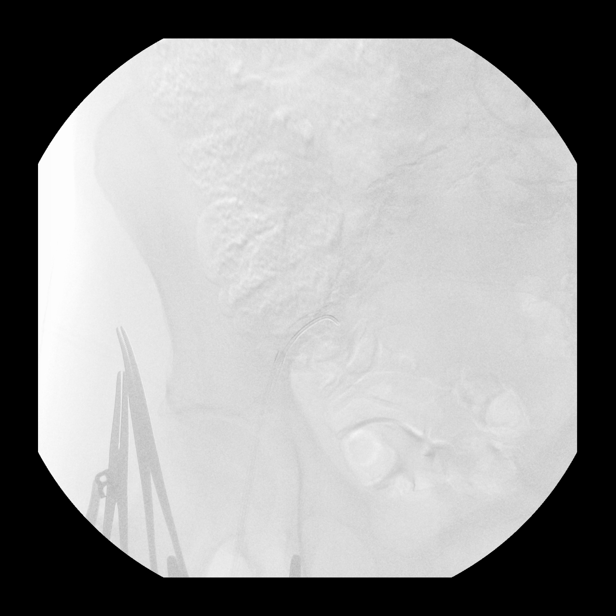
[im 1/5]
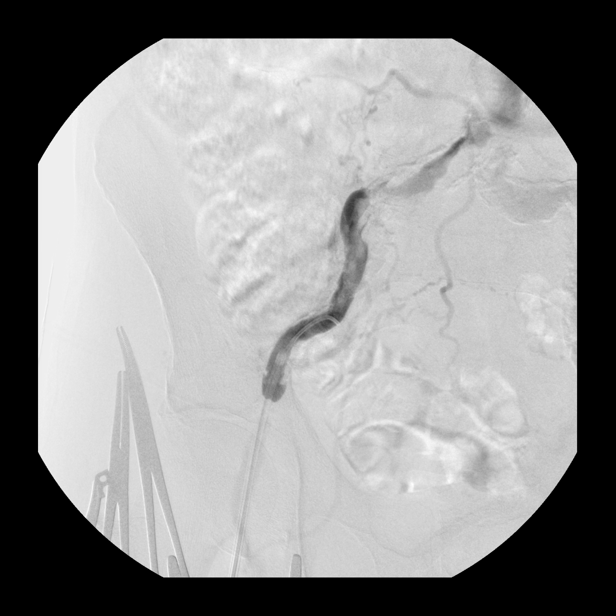
[im 1/5]
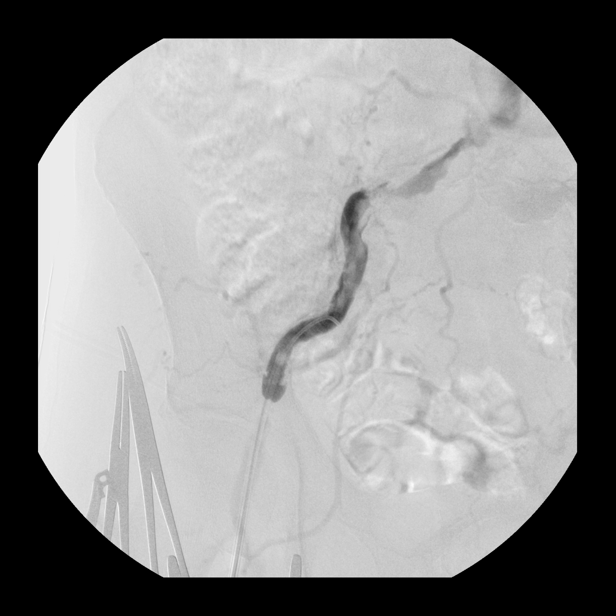
[im 2/5]
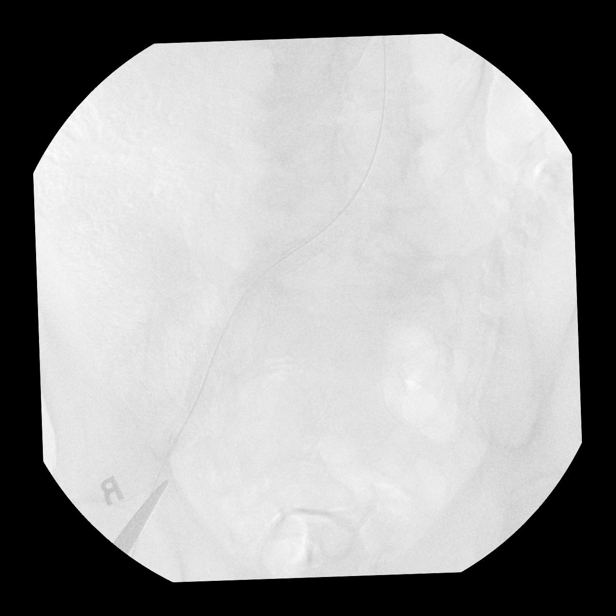
[im 2/5]
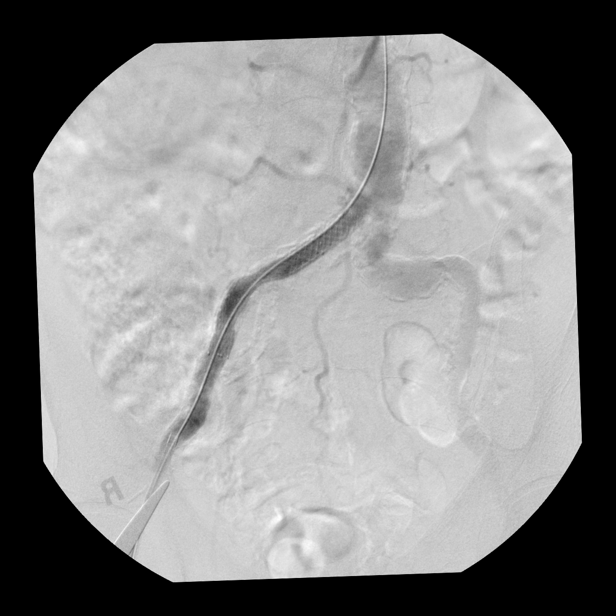
[im 2/5]
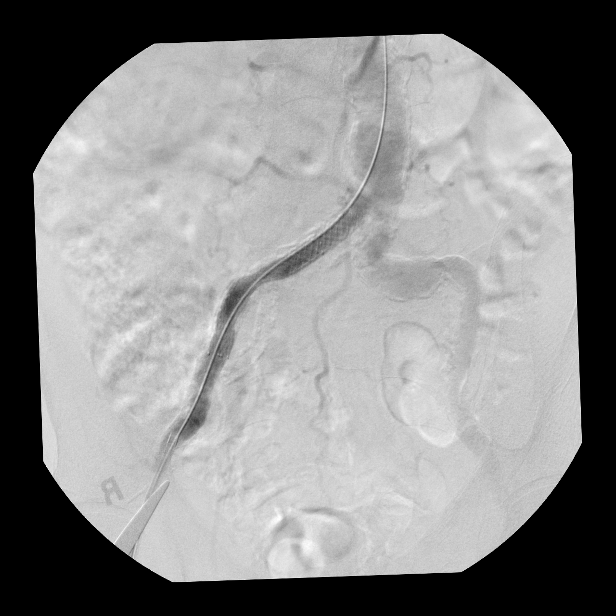
[im 3/5]
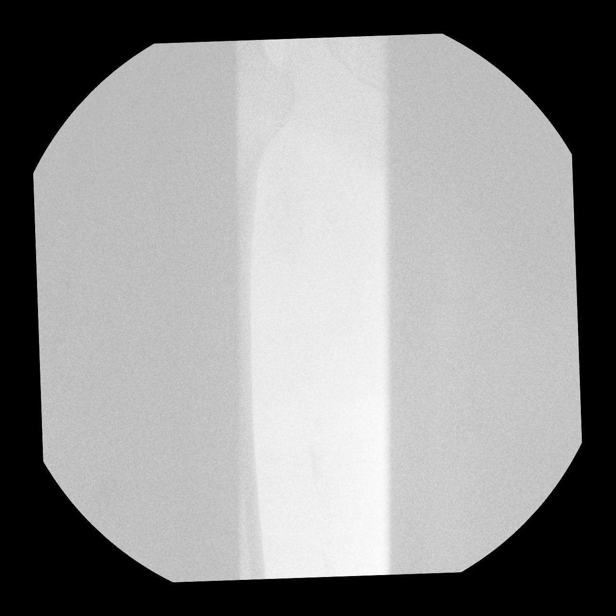
[im 3/5]
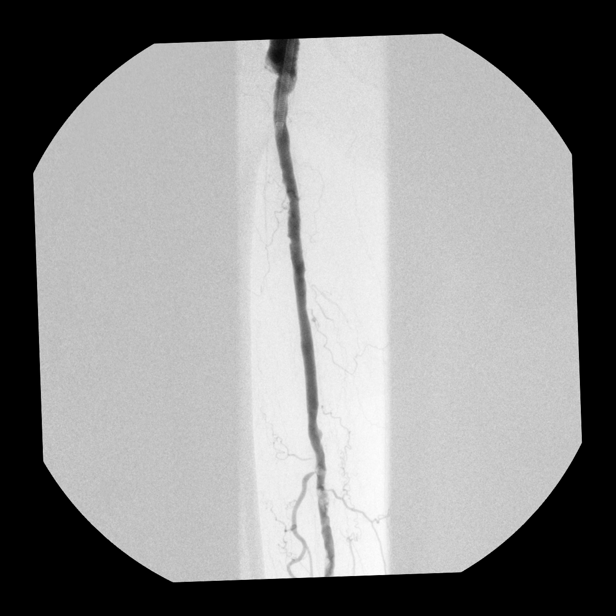
[im 3/5]
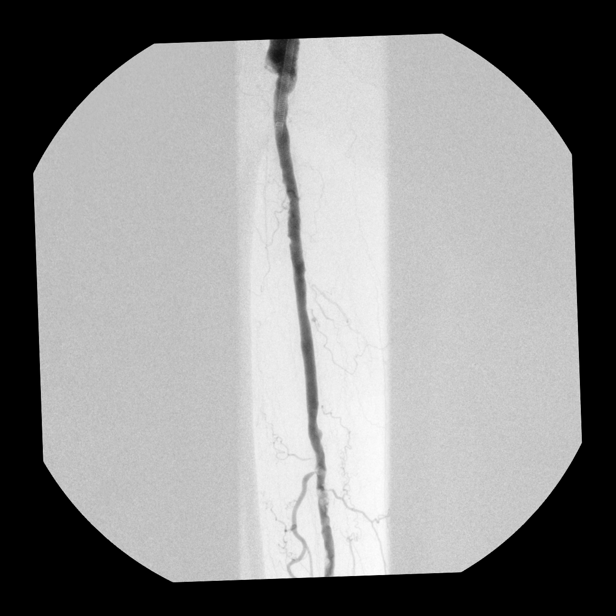
[im 4/5]
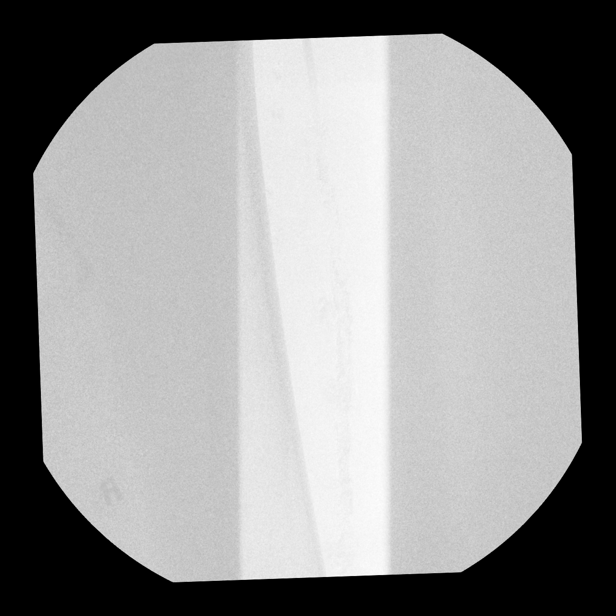
[im 4/5]
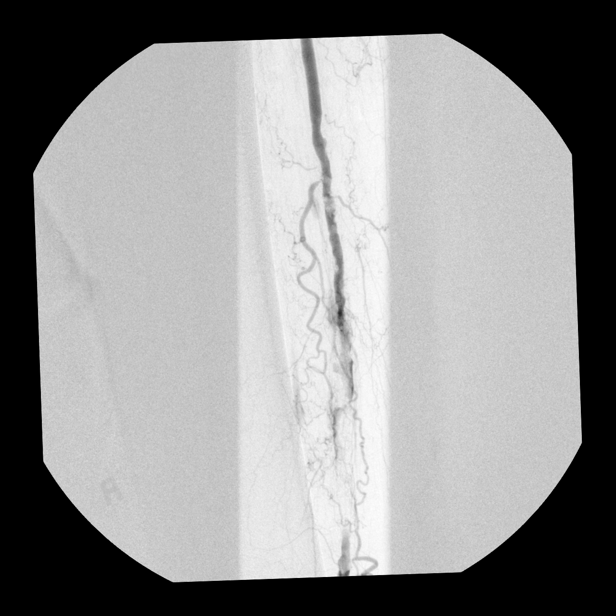
[im 4/5]
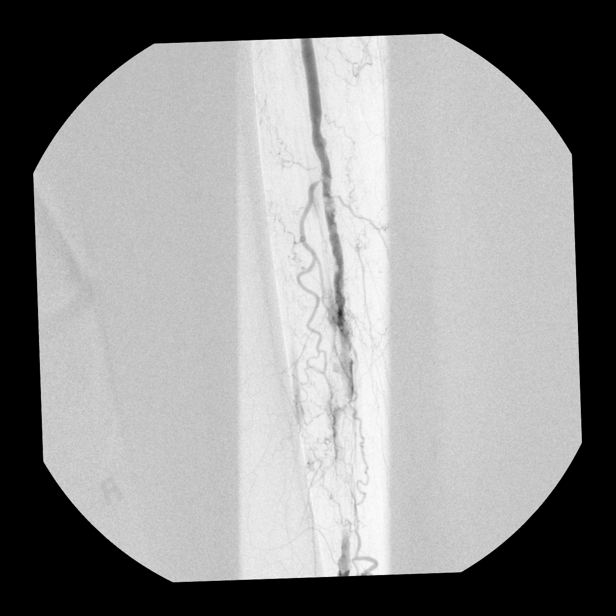
[im 5/5]
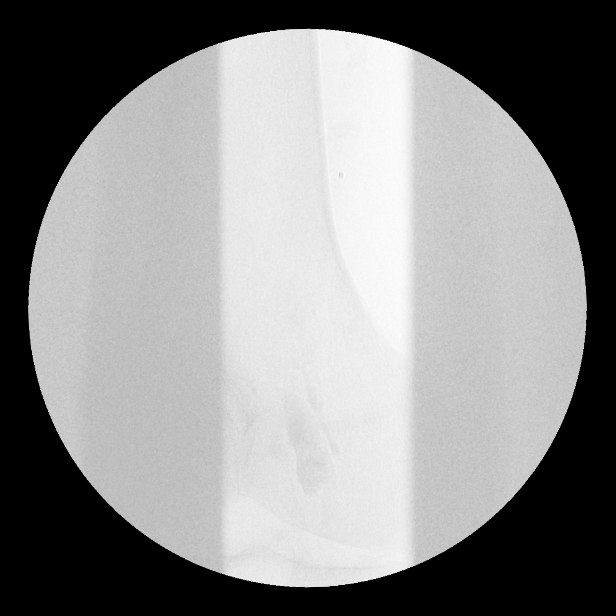
[im 5/5]
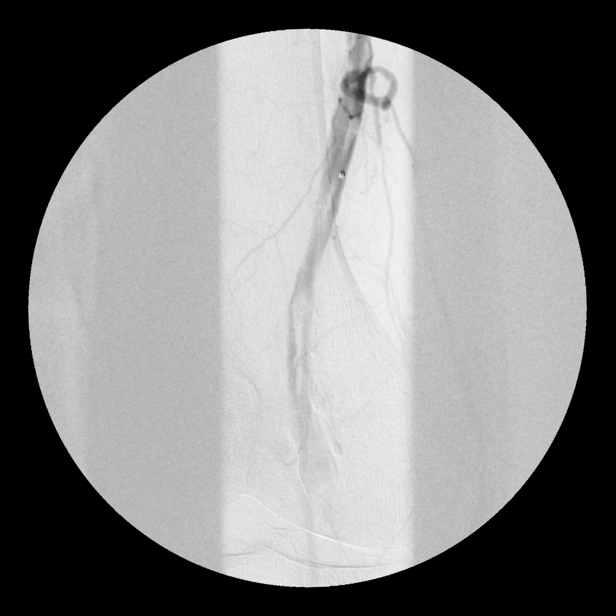
[im 5/5]
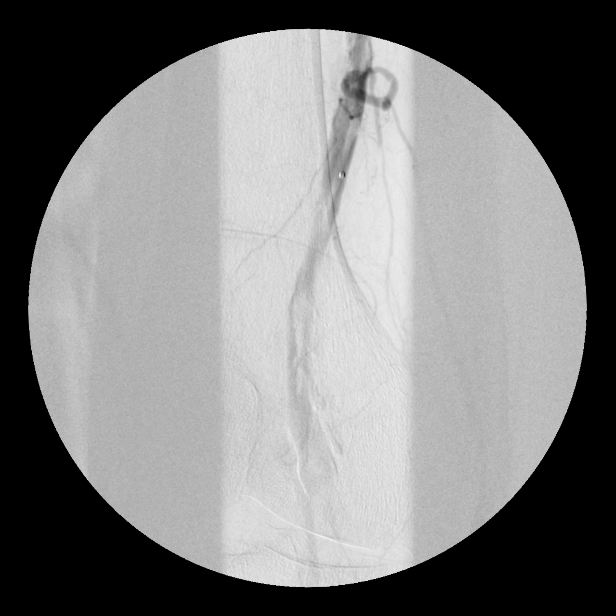

[15 of 20 positions shown; findings below may reference images not displayed]

FINDINGS: Submitted images demonstrate access of the right common femoral
artery with subsequent opacification of the right common and
external iliac artery. Multifocal high-grade stenoses noted in the
right common and external iliac arteries which improved
significantly status post stent placement.

High-grade stenosis with complete to near complete occlusion also
present in the distal SFA and popliteal arteries.
IMPRESSION: Intraoperative of angiogram of the right lower extremity as above.
